# Patient Record
Sex: Female | Born: 1955
Health system: Southern US, Community
[De-identification: ages and names within clinical notes are randomized; demographics above are authoritative.]

## PROBLEM LIST (undated history)

## (undated) DIAGNOSIS — M1990 Unspecified osteoarthritis, unspecified site: Secondary | ICD-10-CM

## (undated) DIAGNOSIS — N200 Calculus of kidney: Secondary | ICD-10-CM

## (undated) DIAGNOSIS — Z9981 Dependence on supplemental oxygen: Secondary | ICD-10-CM

## (undated) DIAGNOSIS — B1920 Unspecified viral hepatitis C without hepatic coma: Secondary | ICD-10-CM

## (undated) DIAGNOSIS — J189 Pneumonia, unspecified organism: Secondary | ICD-10-CM

## (undated) DIAGNOSIS — Z720 Tobacco use: Secondary | ICD-10-CM

## (undated) DIAGNOSIS — M549 Dorsalgia, unspecified: Secondary | ICD-10-CM

## (undated) DIAGNOSIS — M5136 Other intervertebral disc degeneration, lumbar region: Secondary | ICD-10-CM

## (undated) DIAGNOSIS — Z8719 Personal history of other diseases of the digestive system: Secondary | ICD-10-CM

## (undated) DIAGNOSIS — Z8711 Personal history of peptic ulcer disease: Secondary | ICD-10-CM

## (undated) DIAGNOSIS — R52 Pain, unspecified: Secondary | ICD-10-CM

## (undated) DIAGNOSIS — G8929 Other chronic pain: Secondary | ICD-10-CM

## (undated) DIAGNOSIS — R51 Headache: Secondary | ICD-10-CM

## (undated) DIAGNOSIS — R519 Headache, unspecified: Secondary | ICD-10-CM

## (undated) DIAGNOSIS — F329 Major depressive disorder, single episode, unspecified: Secondary | ICD-10-CM

## (undated) DIAGNOSIS — F32A Depression, unspecified: Secondary | ICD-10-CM

## (undated) DIAGNOSIS — D649 Anemia, unspecified: Secondary | ICD-10-CM

## (undated) DIAGNOSIS — I10 Essential (primary) hypertension: Secondary | ICD-10-CM

## (undated) DIAGNOSIS — F419 Anxiety disorder, unspecified: Secondary | ICD-10-CM

## (undated) DIAGNOSIS — I77819 Aortic ectasia, unspecified site: Secondary | ICD-10-CM

## (undated) DIAGNOSIS — IMO0002 Reserved for concepts with insufficient information to code with codable children: Secondary | ICD-10-CM

## (undated) DIAGNOSIS — J42 Unspecified chronic bronchitis: Secondary | ICD-10-CM

## (undated) DIAGNOSIS — M4800 Spinal stenosis, site unspecified: Secondary | ICD-10-CM

## (undated) DIAGNOSIS — Z8489 Family history of other specified conditions: Secondary | ICD-10-CM

## (undated) DIAGNOSIS — J449 Chronic obstructive pulmonary disease, unspecified: Secondary | ICD-10-CM

## (undated) DIAGNOSIS — Z22322 Carrier or suspected carrier of Methicillin resistant Staphylococcus aureus: Secondary | ICD-10-CM

## (undated) HISTORY — PX: BACK SURGERY: SHX140

## (undated) HISTORY — DX: Chronic obstructive pulmonary disease, unspecified: J44.9

## (undated) HISTORY — PX: FRACTURE SURGERY: SHX138

---

## 1996-10-08 HISTORY — PX: TOTAL ABDOMINAL HYSTERECTOMY: SHX209

## 1996-10-08 HISTORY — PX: INCONTINENCE SURGERY: SHX676

## 1999-01-17 ENCOUNTER — Other Ambulatory Visit: Admission: RE | Admit: 1999-01-17 | Discharge: 1999-01-17 | Payer: Self-pay | Admitting: Obstetrics and Gynecology

## 1999-02-13 ENCOUNTER — Other Ambulatory Visit: Admission: RE | Admit: 1999-02-13 | Discharge: 1999-02-13 | Payer: Self-pay | Admitting: Obstetrics and Gynecology

## 1999-02-16 ENCOUNTER — Encounter: Payer: Self-pay | Admitting: Obstetrics and Gynecology

## 1999-02-16 ENCOUNTER — Ambulatory Visit (HOSPITAL_COMMUNITY): Admission: RE | Admit: 1999-02-16 | Discharge: 1999-02-16 | Payer: Self-pay | Admitting: Obstetrics and Gynecology

## 1999-02-17 ENCOUNTER — Inpatient Hospital Stay (HOSPITAL_COMMUNITY): Admission: RE | Admit: 1999-02-17 | Discharge: 1999-02-19 | Payer: Self-pay | Admitting: Obstetrics and Gynecology

## 1999-05-23 ENCOUNTER — Emergency Department (HOSPITAL_COMMUNITY): Admission: EM | Admit: 1999-05-23 | Discharge: 1999-05-23 | Payer: Self-pay | Admitting: Family Medicine

## 1999-05-23 ENCOUNTER — Encounter: Payer: Self-pay | Admitting: Family Medicine

## 1999-10-15 ENCOUNTER — Emergency Department (HOSPITAL_COMMUNITY): Admission: EM | Admit: 1999-10-15 | Discharge: 1999-10-15 | Payer: Self-pay | Admitting: Emergency Medicine

## 1999-12-27 ENCOUNTER — Ambulatory Visit (HOSPITAL_COMMUNITY): Admission: RE | Admit: 1999-12-27 | Discharge: 1999-12-27 | Payer: Self-pay | Admitting: *Deleted

## 2000-02-16 ENCOUNTER — Ambulatory Visit (HOSPITAL_COMMUNITY): Admission: RE | Admit: 2000-02-16 | Discharge: 2000-02-16 | Payer: Self-pay | Admitting: Neurosurgery

## 2000-03-28 ENCOUNTER — Ambulatory Visit (HOSPITAL_COMMUNITY): Admission: RE | Admit: 2000-03-28 | Discharge: 2000-03-28 | Payer: Self-pay | Admitting: Neurosurgery

## 2000-07-10 ENCOUNTER — Inpatient Hospital Stay (HOSPITAL_COMMUNITY): Admission: EM | Admit: 2000-07-10 | Discharge: 2000-07-12 | Payer: Self-pay | Admitting: *Deleted

## 2000-07-10 ENCOUNTER — Encounter: Payer: Self-pay | Admitting: *Deleted

## 2000-07-11 ENCOUNTER — Encounter: Payer: Self-pay | Admitting: Internal Medicine

## 2000-10-08 HISTORY — PX: ANTERIOR CERVICAL DECOMP/DISCECTOMY FUSION: SHX1161

## 2000-12-11 ENCOUNTER — Emergency Department (HOSPITAL_COMMUNITY): Admission: EM | Admit: 2000-12-11 | Discharge: 2000-12-11 | Payer: Self-pay | Admitting: Emergency Medicine

## 2000-12-11 ENCOUNTER — Encounter: Payer: Self-pay | Admitting: Emergency Medicine

## 2000-12-15 ENCOUNTER — Emergency Department (HOSPITAL_COMMUNITY): Admission: EM | Admit: 2000-12-15 | Discharge: 2000-12-15 | Payer: Self-pay | Admitting: Emergency Medicine

## 2000-12-29 ENCOUNTER — Emergency Department (HOSPITAL_COMMUNITY): Admission: EM | Admit: 2000-12-29 | Discharge: 2000-12-29 | Payer: Self-pay | Admitting: Emergency Medicine

## 2001-02-14 ENCOUNTER — Emergency Department (HOSPITAL_COMMUNITY): Admission: EM | Admit: 2001-02-14 | Discharge: 2001-02-14 | Payer: Self-pay | Admitting: Emergency Medicine

## 2001-03-06 ENCOUNTER — Ambulatory Visit (HOSPITAL_COMMUNITY): Admission: RE | Admit: 2001-03-06 | Discharge: 2001-03-06 | Payer: Self-pay | Admitting: Neurosurgery

## 2001-03-20 ENCOUNTER — Observation Stay (HOSPITAL_COMMUNITY): Admission: RE | Admit: 2001-03-20 | Discharge: 2001-03-21 | Payer: Self-pay | Admitting: Neurosurgery

## 2001-04-18 ENCOUNTER — Encounter: Admission: RE | Admit: 2001-04-18 | Discharge: 2001-04-18 | Payer: Self-pay | Admitting: Neurosurgery

## 2001-05-07 ENCOUNTER — Encounter: Admission: RE | Admit: 2001-05-07 | Discharge: 2001-05-07 | Payer: Self-pay | Admitting: Neurosurgery

## 2001-06-17 ENCOUNTER — Encounter: Admission: RE | Admit: 2001-06-17 | Discharge: 2001-06-17 | Payer: Self-pay | Admitting: Neurosurgery

## 2001-06-30 ENCOUNTER — Ambulatory Visit (HOSPITAL_COMMUNITY): Admission: RE | Admit: 2001-06-30 | Discharge: 2001-06-30 | Payer: Self-pay | Admitting: Neurosurgery

## 2001-12-14 ENCOUNTER — Emergency Department (HOSPITAL_COMMUNITY): Admission: EM | Admit: 2001-12-14 | Discharge: 2001-12-14 | Payer: Self-pay | Admitting: Emergency Medicine

## 2002-01-25 ENCOUNTER — Encounter: Payer: Self-pay | Admitting: Emergency Medicine

## 2002-01-25 ENCOUNTER — Emergency Department (HOSPITAL_COMMUNITY): Admission: EM | Admit: 2002-01-25 | Discharge: 2002-01-25 | Payer: Self-pay | Admitting: Emergency Medicine

## 2002-03-27 ENCOUNTER — Encounter: Payer: Self-pay | Admitting: Emergency Medicine

## 2002-03-27 ENCOUNTER — Emergency Department (HOSPITAL_COMMUNITY): Admission: EM | Admit: 2002-03-27 | Discharge: 2002-03-27 | Payer: Self-pay | Admitting: Emergency Medicine

## 2002-06-21 ENCOUNTER — Emergency Department (HOSPITAL_COMMUNITY): Admission: EM | Admit: 2002-06-21 | Discharge: 2002-06-21 | Payer: Self-pay | Admitting: Emergency Medicine

## 2002-06-22 ENCOUNTER — Encounter: Payer: Self-pay | Admitting: Emergency Medicine

## 2002-07-24 ENCOUNTER — Encounter: Payer: Self-pay | Admitting: Emergency Medicine

## 2002-07-24 ENCOUNTER — Emergency Department (HOSPITAL_COMMUNITY): Admission: EM | Admit: 2002-07-24 | Discharge: 2002-07-24 | Payer: Self-pay | Admitting: Emergency Medicine

## 2002-07-27 ENCOUNTER — Emergency Department (HOSPITAL_COMMUNITY): Admission: EM | Admit: 2002-07-27 | Discharge: 2002-07-27 | Payer: Self-pay | Admitting: Emergency Medicine

## 2003-02-04 ENCOUNTER — Emergency Department (HOSPITAL_COMMUNITY): Admission: EM | Admit: 2003-02-04 | Discharge: 2003-02-04 | Payer: Self-pay | Admitting: Emergency Medicine

## 2003-08-29 ENCOUNTER — Emergency Department (HOSPITAL_COMMUNITY): Admission: EM | Admit: 2003-08-29 | Discharge: 2003-08-29 | Payer: Self-pay | Admitting: Emergency Medicine

## 2004-01-28 ENCOUNTER — Emergency Department (HOSPITAL_COMMUNITY): Admission: EM | Admit: 2004-01-28 | Discharge: 2004-01-28 | Payer: Self-pay | Admitting: Emergency Medicine

## 2004-02-01 ENCOUNTER — Emergency Department (HOSPITAL_COMMUNITY): Admission: EM | Admit: 2004-02-01 | Discharge: 2004-02-01 | Payer: Self-pay | Admitting: Emergency Medicine

## 2004-02-13 ENCOUNTER — Emergency Department (HOSPITAL_COMMUNITY): Admission: EM | Admit: 2004-02-13 | Discharge: 2004-02-13 | Payer: Self-pay | Admitting: Emergency Medicine

## 2004-07-31 ENCOUNTER — Emergency Department (HOSPITAL_COMMUNITY): Admission: EM | Admit: 2004-07-31 | Discharge: 2004-07-31 | Payer: Self-pay | Admitting: Emergency Medicine

## 2004-08-17 ENCOUNTER — Emergency Department (HOSPITAL_COMMUNITY): Admission: EM | Admit: 2004-08-17 | Discharge: 2004-08-17 | Payer: Self-pay | Admitting: Emergency Medicine

## 2005-03-24 ENCOUNTER — Emergency Department (HOSPITAL_COMMUNITY): Admission: EM | Admit: 2005-03-24 | Discharge: 2005-03-24 | Payer: Self-pay | Admitting: Emergency Medicine

## 2005-04-16 ENCOUNTER — Emergency Department (HOSPITAL_COMMUNITY): Admission: EM | Admit: 2005-04-16 | Discharge: 2005-04-16 | Payer: Self-pay | Admitting: Emergency Medicine

## 2005-05-09 ENCOUNTER — Emergency Department (HOSPITAL_COMMUNITY): Admission: EM | Admit: 2005-05-09 | Discharge: 2005-05-09 | Payer: Self-pay | Admitting: Emergency Medicine

## 2005-05-14 ENCOUNTER — Ambulatory Visit (HOSPITAL_COMMUNITY): Admission: RE | Admit: 2005-05-14 | Discharge: 2005-05-14 | Payer: Self-pay | Admitting: Orthopedic Surgery

## 2005-06-25 ENCOUNTER — Encounter
Admission: RE | Admit: 2005-06-25 | Discharge: 2005-09-23 | Payer: Self-pay | Admitting: Physical Medicine & Rehabilitation

## 2005-06-25 ENCOUNTER — Ambulatory Visit: Payer: Self-pay | Admitting: Physical Medicine & Rehabilitation

## 2005-07-14 ENCOUNTER — Emergency Department (HOSPITAL_COMMUNITY): Admission: EM | Admit: 2005-07-14 | Discharge: 2005-07-14 | Payer: Self-pay | Admitting: Emergency Medicine

## 2005-09-07 ENCOUNTER — Emergency Department (HOSPITAL_COMMUNITY): Admission: EM | Admit: 2005-09-07 | Discharge: 2005-09-07 | Payer: Self-pay | Admitting: Emergency Medicine

## 2005-09-22 ENCOUNTER — Encounter
Admission: RE | Admit: 2005-09-22 | Discharge: 2005-12-21 | Payer: Self-pay | Admitting: Physical Medicine & Rehabilitation

## 2005-09-22 ENCOUNTER — Ambulatory Visit: Payer: Self-pay | Admitting: Physical Medicine & Rehabilitation

## 2005-10-24 ENCOUNTER — Ambulatory Visit: Payer: Self-pay | Admitting: Physical Medicine & Rehabilitation

## 2005-10-25 ENCOUNTER — Ambulatory Visit: Payer: Self-pay | Admitting: Physical Medicine & Rehabilitation

## 2005-11-14 ENCOUNTER — Emergency Department (HOSPITAL_COMMUNITY): Admission: EM | Admit: 2005-11-14 | Discharge: 2005-11-14 | Payer: Self-pay | Admitting: Emergency Medicine

## 2005-11-23 ENCOUNTER — Ambulatory Visit (HOSPITAL_COMMUNITY)
Admission: RE | Admit: 2005-11-23 | Discharge: 2005-11-23 | Payer: Self-pay | Admitting: Physical Medicine & Rehabilitation

## 2005-11-27 ENCOUNTER — Emergency Department (HOSPITAL_COMMUNITY): Admission: EM | Admit: 2005-11-27 | Discharge: 2005-11-27 | Payer: Self-pay | Admitting: Emergency Medicine

## 2005-12-20 ENCOUNTER — Encounter
Admission: RE | Admit: 2005-12-20 | Discharge: 2006-03-20 | Payer: Self-pay | Admitting: Physical Medicine & Rehabilitation

## 2005-12-20 ENCOUNTER — Ambulatory Visit: Payer: Self-pay | Admitting: Physical Medicine & Rehabilitation

## 2006-01-01 ENCOUNTER — Emergency Department (HOSPITAL_COMMUNITY): Admission: EM | Admit: 2006-01-01 | Discharge: 2006-01-01 | Payer: Self-pay | Admitting: Emergency Medicine

## 2006-01-07 ENCOUNTER — Encounter: Admission: RE | Admit: 2006-01-07 | Discharge: 2006-04-07 | Payer: Self-pay | Admitting: Anesthesiology

## 2006-01-08 ENCOUNTER — Ambulatory Visit: Payer: Self-pay | Admitting: Anesthesiology

## 2006-04-01 ENCOUNTER — Emergency Department (HOSPITAL_COMMUNITY): Admission: EM | Admit: 2006-04-01 | Discharge: 2006-04-01 | Payer: Self-pay | Admitting: *Deleted

## 2006-07-20 ENCOUNTER — Emergency Department (HOSPITAL_COMMUNITY): Admission: EM | Admit: 2006-07-20 | Discharge: 2006-07-20 | Payer: Self-pay | Admitting: Emergency Medicine

## 2006-10-17 ENCOUNTER — Emergency Department (HOSPITAL_COMMUNITY): Admission: EM | Admit: 2006-10-17 | Discharge: 2006-10-17 | Payer: Self-pay | Admitting: Family Medicine

## 2006-10-17 ENCOUNTER — Emergency Department (HOSPITAL_COMMUNITY): Admission: EM | Admit: 2006-10-17 | Discharge: 2006-10-17 | Payer: Self-pay | Admitting: Emergency Medicine

## 2007-04-05 ENCOUNTER — Emergency Department (HOSPITAL_COMMUNITY): Admission: EM | Admit: 2007-04-05 | Discharge: 2007-04-05 | Payer: Self-pay | Admitting: Family Medicine

## 2007-04-25 ENCOUNTER — Emergency Department (HOSPITAL_COMMUNITY): Admission: EM | Admit: 2007-04-25 | Discharge: 2007-04-25 | Payer: Self-pay | Admitting: Emergency Medicine

## 2007-07-22 ENCOUNTER — Encounter: Payer: Self-pay | Admitting: Emergency Medicine

## 2007-07-23 ENCOUNTER — Inpatient Hospital Stay (HOSPITAL_COMMUNITY): Admission: AD | Admit: 2007-07-23 | Discharge: 2007-07-29 | Payer: Self-pay | Admitting: Psychiatry

## 2007-07-23 ENCOUNTER — Ambulatory Visit: Payer: Self-pay | Admitting: Psychiatry

## 2007-08-20 ENCOUNTER — Inpatient Hospital Stay (HOSPITAL_COMMUNITY): Admission: EM | Admit: 2007-08-20 | Discharge: 2007-08-29 | Payer: Self-pay | Admitting: Emergency Medicine

## 2007-08-20 ENCOUNTER — Ambulatory Visit: Payer: Self-pay | Admitting: Internal Medicine

## 2007-09-05 ENCOUNTER — Encounter (INDEPENDENT_AMBULATORY_CARE_PROVIDER_SITE_OTHER): Payer: Self-pay | Admitting: Internal Medicine

## 2007-09-10 ENCOUNTER — Encounter (INDEPENDENT_AMBULATORY_CARE_PROVIDER_SITE_OTHER): Payer: Self-pay | Admitting: Internal Medicine

## 2007-09-11 ENCOUNTER — Ambulatory Visit: Payer: Self-pay | Admitting: Internal Medicine

## 2007-09-11 DIAGNOSIS — F1910 Other psychoactive substance abuse, uncomplicated: Secondary | ICD-10-CM

## 2007-09-11 DIAGNOSIS — F17200 Nicotine dependence, unspecified, uncomplicated: Secondary | ICD-10-CM | POA: Insufficient documentation

## 2007-09-11 DIAGNOSIS — J13 Pneumonia due to Streptococcus pneumoniae: Secondary | ICD-10-CM | POA: Insufficient documentation

## 2007-09-11 DIAGNOSIS — A403 Sepsis due to Streptococcus pneumoniae: Secondary | ICD-10-CM | POA: Insufficient documentation

## 2007-09-11 DIAGNOSIS — J449 Chronic obstructive pulmonary disease, unspecified: Secondary | ICD-10-CM

## 2007-09-11 DIAGNOSIS — J4489 Other specified chronic obstructive pulmonary disease: Secondary | ICD-10-CM | POA: Insufficient documentation

## 2007-09-14 DIAGNOSIS — R030 Elevated blood-pressure reading, without diagnosis of hypertension: Secondary | ICD-10-CM

## 2007-10-31 ENCOUNTER — Encounter (INDEPENDENT_AMBULATORY_CARE_PROVIDER_SITE_OTHER): Payer: Self-pay | Admitting: Internal Medicine

## 2007-12-17 ENCOUNTER — Ambulatory Visit: Payer: Self-pay | Admitting: Internal Medicine

## 2007-12-17 ENCOUNTER — Encounter (INDEPENDENT_AMBULATORY_CARE_PROVIDER_SITE_OTHER): Payer: Self-pay | Admitting: Internal Medicine

## 2007-12-17 DIAGNOSIS — M542 Cervicalgia: Secondary | ICD-10-CM

## 2007-12-18 ENCOUNTER — Encounter (INDEPENDENT_AMBULATORY_CARE_PROVIDER_SITE_OTHER): Payer: Self-pay | Admitting: Internal Medicine

## 2007-12-18 DIAGNOSIS — B1920 Unspecified viral hepatitis C without hepatic coma: Secondary | ICD-10-CM

## 2007-12-18 LAB — CONVERTED CEMR LAB
Alkaline Phosphatase: 115 units/L (ref 39–117)
BUN: 5 mg/dL — ABNORMAL LOW (ref 6–23)
HCT: 39 % (ref 36.0–46.0)
HCV Ab: POSITIVE — AB
Hemoglobin: 12.4 g/dL (ref 12.0–15.0)
MCHC: 31.8 g/dL (ref 30.0–36.0)
MCV: 93.3 fL (ref 78.0–100.0)
Platelets: 256 10*3/uL (ref 150–400)
Potassium: 4.7 meq/L (ref 3.5–5.3)
RBC: 4.18 M/uL (ref 3.87–5.11)
Sodium: 143 meq/L (ref 135–145)
Total Bilirubin: 0.3 mg/dL (ref 0.3–1.2)

## 2007-12-19 ENCOUNTER — Telehealth: Payer: Self-pay | Admitting: *Deleted

## 2007-12-23 ENCOUNTER — Ambulatory Visit: Payer: Self-pay | Admitting: Internal Medicine

## 2007-12-23 ENCOUNTER — Encounter (INDEPENDENT_AMBULATORY_CARE_PROVIDER_SITE_OTHER): Payer: Self-pay | Admitting: Internal Medicine

## 2008-05-29 ENCOUNTER — Emergency Department (HOSPITAL_COMMUNITY): Admission: EM | Admit: 2008-05-29 | Discharge: 2008-05-29 | Payer: Self-pay | Admitting: Emergency Medicine

## 2008-08-24 ENCOUNTER — Emergency Department (HOSPITAL_COMMUNITY): Admission: EM | Admit: 2008-08-24 | Discharge: 2008-08-24 | Payer: Self-pay | Admitting: Emergency Medicine

## 2008-08-30 ENCOUNTER — Encounter: Payer: Self-pay | Admitting: Internal Medicine

## 2008-11-09 ENCOUNTER — Encounter: Payer: Self-pay | Admitting: Internal Medicine

## 2009-09-13 ENCOUNTER — Emergency Department (HOSPITAL_COMMUNITY): Admission: EM | Admit: 2009-09-13 | Discharge: 2009-09-13 | Payer: Self-pay | Admitting: Emergency Medicine

## 2009-11-30 ENCOUNTER — Emergency Department (HOSPITAL_COMMUNITY): Admission: EM | Admit: 2009-11-30 | Discharge: 2009-11-30 | Payer: Self-pay | Admitting: Emergency Medicine

## 2010-06-16 ENCOUNTER — Emergency Department (HOSPITAL_COMMUNITY): Admission: EM | Admit: 2010-06-16 | Discharge: 2010-06-17 | Payer: Self-pay | Admitting: Emergency Medicine

## 2010-06-21 ENCOUNTER — Emergency Department (HOSPITAL_COMMUNITY): Admission: EM | Admit: 2010-06-21 | Discharge: 2010-06-21 | Payer: Self-pay | Admitting: Emergency Medicine

## 2010-08-26 ENCOUNTER — Emergency Department (HOSPITAL_COMMUNITY): Admission: EM | Admit: 2010-08-26 | Discharge: 2010-08-26 | Payer: Self-pay | Admitting: Emergency Medicine

## 2010-10-29 ENCOUNTER — Encounter: Payer: Self-pay | Admitting: Internal Medicine

## 2010-10-29 ENCOUNTER — Encounter: Payer: Self-pay | Admitting: Hospitalist

## 2010-12-21 LAB — DIFFERENTIAL
Basophils Absolute: 0.1 10*3/uL (ref 0.0–0.1)
Basophils Relative: 1 % (ref 0–1)
Eosinophils Absolute: 0.2 10*3/uL (ref 0.0–0.7)
Eosinophils Relative: 1 % (ref 0–5)
Lymphocytes Relative: 27 % (ref 12–46)
Lymphs Abs: 2.4 10*3/uL (ref 0.7–4.0)
Monocytes Relative: 7 % (ref 3–12)
Neutro Abs: 5.7 10*3/uL (ref 1.7–7.7)
Neutrophils Relative %: 65 % (ref 43–77)

## 2010-12-21 LAB — CBC
MCHC: 33.9 g/dL (ref 30.0–36.0)
MCHC: 34.1 g/dL (ref 30.0–36.0)
RBC: 4.88 MIL/uL (ref 3.87–5.11)
RBC: 4.74 MIL/uL (ref 3.87–5.11)
RDW: 13.5 % (ref 11.5–15.5)
RDW: 13.8 % (ref 11.5–15.5)
WBC: 8.7 10*3/uL (ref 4.0–10.5)

## 2010-12-21 LAB — URINALYSIS, ROUTINE W REFLEX MICROSCOPIC
Bilirubin Urine: NEGATIVE
Bilirubin Urine: NEGATIVE
Glucose, UA: NEGATIVE mg/dL
Hgb urine dipstick: NEGATIVE
Hgb urine dipstick: NEGATIVE
Ketones, ur: NEGATIVE mg/dL
Ketones, ur: NEGATIVE mg/dL
Nitrite: NEGATIVE
Protein, ur: NEGATIVE mg/dL
Urobilinogen, UA: 0.2 mg/dL (ref 0.0–1.0)
pH: 5.5 (ref 5.0–8.0)
pH: 6 (ref 5.0–8.0)

## 2010-12-21 LAB — RAPID URINE DRUG SCREEN, HOSP PERFORMED
Amphetamines: NOT DETECTED
Amphetamines: NOT DETECTED
Barbiturates: NOT DETECTED
Barbiturates: NOT DETECTED
Benzodiazepines: NOT DETECTED
Benzodiazepines: NOT DETECTED
Cocaine: POSITIVE — AB
Opiates: NOT DETECTED

## 2010-12-21 LAB — BASIC METABOLIC PANEL
BUN: 2 mg/dL — ABNORMAL LOW (ref 6–23)
CO2: 28 mEq/L (ref 19–32)
CO2: 28 mEq/L (ref 19–32)
Calcium: 9.4 mg/dL (ref 8.4–10.5)
Calcium: 9.4 mg/dL (ref 8.4–10.5)
Chloride: 101 mEq/L (ref 96–112)
Chloride: 105 mEq/L (ref 96–112)
GFR calc non Af Amer: 54 mL/min — ABNORMAL LOW (ref >60)
GFR calc non Af Amer: >60 mL/min (ref >60)
Glucose, Bld: 94 mg/dL (ref 70–99)
Potassium: 4.2 mEq/L (ref 3.5–5.1)
Potassium: 3.3 mEq/L — ABNORMAL LOW (ref 3.5–5.1)
Sodium: 137 mEq/L (ref 135–145)
Sodium: 139 mEq/L (ref 135–145)

## 2010-12-21 LAB — ETHANOL
Alcohol, Ethyl (B): <5 mg/dL (ref 0–10)
Alcohol, Ethyl (B): <5 mg/dL (ref 0–10)

## 2011-01-09 LAB — DIFFERENTIAL
Eosinophils Absolute: 0.3 10*3/uL (ref 0.0–0.7)
Eosinophils Relative: 4 % (ref 0–5)
Lymphs Abs: 2.7 10*3/uL (ref 0.7–4.0)
Monocytes Relative: 8 % (ref 3–12)

## 2011-01-09 LAB — CBC
HCT: 40.6 % (ref 36.0–46.0)
MCHC: 33.9 g/dL (ref 30.0–36.0)
MCV: 93.8 fL (ref 78.0–100.0)
Platelets: 198 10*3/uL (ref 150–400)
WBC: 9.4 10*3/uL (ref 4.0–10.5)

## 2011-01-09 LAB — BASIC METABOLIC PANEL
BUN: 1 mg/dL — ABNORMAL LOW (ref 6–23)
CO2: 24 mEq/L (ref 19–32)
Chloride: 109 mEq/L (ref 96–112)
Potassium: 3.2 mEq/L — ABNORMAL LOW (ref 3.5–5.1)

## 2011-01-09 LAB — POCT CARDIAC MARKERS
CKMB, poc: <1 ng/mL — ABNORMAL LOW (ref 1.0–8.0)
Troponin i, poc: <0.05 ng/mL (ref 0.00–0.09)
Troponin i, poc: <0.05 ng/mL (ref 0.00–0.09)

## 2011-02-20 NOTE — Discharge Summary (Signed)
NAMEJIANNI, SHELDEN NO.:  [id number]   MEDICAL RECORD NO.:  [id number]          PATIENT TYPE:  INP   LOCATION:  2027                         FACILITY:  [facility]   PHYSICIAN:  Ileana Roup, M.D.  DATE OF BIRTH:  06/20/1956   DATE OF ADMISSION:  08/20/2007  DATE OF DISCHARGE:  08/29/2007                               DISCHARGE SUMMARY   DISCHARGE DIAGNOSES:  1. Pneumococcal pneumonia and sepsis.  2. Respiratory failure.  3. Chronic obstructive pulmonary disease.  4. Polysubstance abuse.  5. Tobacco abuse  6. Anemia.   DISCHARGE MEDICATIONS:  1. Seroquel 50 mg p.o. q.h.s.  2. Methadone 40 mg p.o. daily through the methadone maintenance      clinic.  3. Cymbalta 60 mg p.o. daily.  4. Advair 250/50 mcg one puff b.i.d.  5. Avelox 400 mg p.o. daily x3 days.  6. Albuterol MDI two puffs p.r.n. up to four times a day.  7. Home O2 2 L/min. continuous.  The patient is not to smoke while on      oxygen.   DISPOSITION AND FOLLOW-UP:  The patient is to follow up with Dr.  Ardyth Harps in the outpatient clinic on September 09, 2007, at 3:30, at  which time her breathing status should be evaluated.  It should also be  checked to see if the patient could possibly come off of home O2 once  her pneumonia has resolved.  The patient should also be inquired about  whether or not she is smoking cigarettes or not.   PROCEDURES PERFORMED:  1. The patient had chest x-ray on August 20, 2007, which showed      dense right-sided airspace disease in a patient with severe      emphysema.  2. Chest x-ray on November 13 showed marked worsening aeration of the      left chest but no change in the right.  3. Chest x-ray on August 22, 2007.  Impression:  Emphysema with      diffuse bilateral air space disease with some worsening on the left      consistent with pneumonia.  4. Chest x-ray August 23, 2007.  Impression:  (1) extensive      bilateral airspace disease consistent with  pneumonia.  (2)      Emphysema.  5. Chest x-ray August 26, 2007.  Impression:  (1) bilateral      pulmonary airspace disease superimposed on severe bullous      emphysema.  This is mildly improved in the left lung since prior      study.  (2) Increased small right pleural effusion.  Right lung      atelectasis or scarring without change.  6. Chest x-ray August 24, 2007.  Impression:  Bilateral pneumonia.      Some improved aeration of the right lower lobe.  7. Chest x-ray August 28, 2007.  (1) Improved right middle lobe      airspace disease and right pleural effusion.  (2) Severe emphysema      and right lung bullous disease.  (3) Question improving  interstitial edema.   There were no consultations during hospitalization.   Brief H&P is as follows:   CHIEF COMPLAINT:  Shortness of breath.   HISTORY OF PRESENT ILLNESS:  The patient is a 55 year old female with a  history of COPD and chronic bronchitis, who presents with a 1-week  history of shortness of breath.  She called EMS on the day of admission  when she became extremely short of breath and it was noted that her O2  saturations were 76%.  The patient also said she has had a cough  productive of greenish-white sputum, fevers to 100 degrees at home, and  chills.  She has pleuritic chest pain when she takes a deep breath and  feels generally weak and has not had a good appetite.  She has never  been admitted to the hospital for any pulmonary problems before and has  never been intubated.   ALLERGIES:  No known drug allergies.   PAST MEDICAL HISTORY:  1. COPD with chronic bronchitis.  2. DJD secondary to ruptured cervical disk at C2 and C3, status post      cervical fusion.  3. History of polysubstance abuse with methadone maintenance.   HOME MEDICATIONS:  1. Methadone 40 mg daily.  The patient follows up with [facility] and      contacted Darl Pikes, [phone].  2. Cymbalta 60 mg q.h.s.  3. Seroquel 50 mg q.h.s.   For  rest of history, please see hospital chart.   VITAL SIGNS:  Temperature equals 98 initially and then increased to  101.5, blood pressure 109/76, pulse equals 116, respiratory rate equals  23, O2 saturation equals 92% on 15 L.  GENERAL:  The patient appears ill but is awake and alert, in no acute  distress.  EYES:  EOMI, PERRLA, anicteric, slight ptosis on the left eyelid.  ENT: Oropharynx is erythematous without exudate, dry mucous membranes.  NECK:  Supple.  No lymphadenopathy.  No TM.  RESPIRATORY:  Clear apices, decreased breath sounds from bases to mid-  lungs, right greater than left.  Bilateral bibasilar rhonchi that  improve with cough.  The patient is able to talk in full sentences.  CV:  Tachy, regular, no murmurs, rubs or gallops.  GI: Soft, positive bowel sounds, mildly diffusely tender to palpation.  EXTREMITIES:  No clubbing, cyanosis or edema.  SKIN:  Moist, warm and pale.  No lymphadenopathy.  MUSCULOSKELETAL:  Moving all fours.  NEUROLOGIC:  Generalized weakness, grossly nonfocal.  PSYCHIATRIC:  Appropriate.   ADMISSION LABS:  Sodium 132, potassium 3.5, chloride 99, bicarb 23, BUN  26, creatinine 1.6, glucose 121.  WBC 25.2, hemoglobin 13.3 platelets  234, ANC 23.1, MCV equals 88.  Chest x-ray with a right middle lobe  pneumonia in a patient with severe bullous emphysema, no pneumothorax  noted.   HOSPITAL COURSE BY PROBLEM:  Problem 1.  PNEUMOCOCCAL PNEUMONIA AND SEPSIS:  The patient was admitted  to the step-down unit and started on azithromycin and Rocephin for  community-acquired pneumonia.  The patient had a sputum culture and  blood cultures which grew out Streptococcus pneumoniae.  The patient was  initially put on oxygen via nasal cannula but then had to be put on  BiPAP for decreased O2 saturations.  Blood cultures x2 were drawn with  and were positive for Streptococcus pneumoniae.  Of note, the patient  became hypotensive, likely secondary to her strep  bacteremia.  The  patient was given IV fluid rehydration, being careful not to overload  her.  Also of note, the patient's creatinine was initially elevated at  1.6 with the FENA less than 1, indicating that she was volume depleted.  The patient's creatinine improved to normal with hydration.  The patient  slowly improved throughout the hospital stay, but continued to require  supplemental oxygen to maintain adequate O2 saturations.  It is likely  that she has severe underlying emphysema, and pulmonary function testing  as an outpatient would be helpful.  The patient was sent home on home  oxygen, and will need to be evaluated in the outpatient setting as to  whether or not she needs to be continued on this once her pneumonia  resolves.  The patient was put on a nicotine patch while in hospital and  was counseled repeatedly on the need for smoking cessation.  The patient  appeared to be eager to not smoke anymore.  Of note, influenza was also  checked, which was negative.  Legionella was also negative.   Problem 2.  POLYSUBSTANCE ABUSE:  The patient's UDS was positive for  cocaine as well as methadone on her admission.  She reports that the  last time that she did the cocaine was just prior to her bout with  pneumonia.  She received counseling on several occasions that she needed  to avoid smoking cocaine as with her severe lung disease this was very  dangerous.  The patient was continued on her methadone after her dose  was confirmed by speaking with Darl Pikes, who manages this for her.  Patient  will follow up at the methadone maintenance clinic for outpatient  management.   Problem 3.  CHRONIC OBSTRUCTIVE PULMONARY DISEASE:  The patient will be  discharged on albuterol MDI as well as Advair.  She will also have  oxygen via nasal cannula to be administered continuously until this  could be reassessed in the outpatient setting.   Problem 4.  ANEMIA:  Anemia panel revealed this as  consistent with  anemia of chronic disease.  She was FOBT-negative during the hospital  stay.  The patient's hemoglobin remained stable.   DISCHARGE LABS:  Sodium 136, potassium 4.6, chloride 98, bicarb 27,  glucose 111, BUN 6, creatinine 0.83, calcium 9.0.  WBC 14.2 and elevated  likely secondary to steroid usage, hemoglobin 12.2, hematocrit 36.2,  platelets 529.   DISCHARGE VITALS:  Temperature 97.4, pulse 88, respiratory rate 22,  blood pressure 100/70, O2 saturation is 92% on 4 L.      Rufina Falco, M.D.  Electronically Signed      Ileana Roup, M.D.  Electronically Signed    JY/MEDQ  D:  09/05/2007  T:  09/05/2007  Job:  [id number]   cc:   Peggye Pitt, M.D.

## 2011-02-20 NOTE — H&P (Signed)
Emily Moran, Emily Moran NO.:  [id number]   MEDICAL RECORD NO.:  [id number]          PATIENT TYPE:  IPS   LOCATION:  0502                          FACILITY:  [facility]   PHYSICIAN:  Anselm Jungling, MD  DATE OF BIRTH:  June 21, 1956   DATE OF ADMISSION:  07/23/2007  DATE OF DISCHARGE:                       PSYCHIATRIC ADMISSION ASSESSMENT   IDENTIFICATION:  This is a 55 year old female voluntarily admitted on  July 23, 2007.   HISTORY OF PRESENT ILLNESS:  The patient presents with a history of  opiate dependence.  Has been doing a bag of heroin daily.  Was [facility]  [facility] but states the DA was investigating  the [facility], that interrupted her obtaining medications and she had  to resort back to using heroin.  She denies any suicidal or homicidal  thoughts.  She states she has been sleeping well.   PAST PSYCHIATRIC HISTORY:  First admission to the [facility].  Sees Ellis Savage for outpatient mental health treatment.   SOCIAL HISTORY:  This is a 55 year old female, lives alone, has a  history of a legal charge in 1998 by obtaining property by false  pretenses.  She has a son, age 107.  She is currently unemployed.   FAMILY HISTORY:  None.   ALCOHOL/DRUG HISTORY:  As above.  The patient also using marijuana and  cocaine.   PRIMARY CARE PHYSICIAN:  Primary care [name] is Dr. Ardelle Park.   MEDICAL PROBLEMS:  Degenerative disk disease and herniated disk.   MEDICATIONS:  Had a prescription for Lyrica 100 mg daily.   ALLERGIES:  No known allergies.   PHYSICAL EXAMINATION:  This is a middle-aged female, appears thin,  appears older than her calendar years.  She was assessed at [facility]gency [facility].  She received Ativan, clonidine 0.2 mg, IV fluids.  Her temperature is 98.4, heart rate 112, respiration 24, blood pressure  141/96, 94% saturated.   LABORATORY DATA:  Her urinalysis is negative.  Hemoglobin 15.3,  INR is  1.  Urine drug screen is positive for opiates, positive for cocaine,  positive for THC.  Chest x-ray showed no acute changes.  Alcohol level  less than 5.   MENTAL STATUS EXAM:  She is alert, cooperative, casually dressed, fair  eye contact.  Her speech is clear, normal pace and tone.  The patient's  mood is hurting, feeling anxious.  Her affect is flat.  Thought process  no evidence of any thought disorder.  Cognitive function intact.  Memory  is fair.  Judgment and insight is fair.   DIAGNOSES:  AXIS I:  Opiate dependence.  Polysubstance abuse.  AXIS II:  Deferred.  AXIS III:  Hypertension, degenerative disk disease, chronic obstructive  pulmonary disease, arthritis.  AXIS IV:  Medical problems, other psychosocial problems.  AXIS V:  Current 45.   PLAN:  Contract for safety.  Stabilize her mood and thinking.  Will  detox the patient with the clonidine protocol.  Work on relapse  prevention.  Casemanager is to assess her follow-up rehab.  Will also  assess comorbidities  as the patient continues to detox.   TENTATIVE LENGTH OF STAY:  Four to six days.      Landry Corporal, N.P.      Anselm Jungling, MD  Electronically Signed    JO/MEDQ  D:  07/28/2007  T:  07/29/2007  Job:  [id number]

## 2011-02-23 NOTE — Procedures (Signed)
NAMEKIRSTINE, Moran NO.:  [id number]   MEDICAL RECORD NO.:  [id number]          PATIENT TYPE:  REC   LOCATION:  TPC                          FACILITY:  [facility]   PHYSICIAN:  Celene Kras, MD        DATE OF BIRTH:  08-16-1956   DATE OF PROCEDURE:  01/29/2006  DATE OF DISCHARGE:                                 OPERATIVE REPORT   PATIENT:  Emily Moran.   DATE OF BIRTH:  October 01, 1956   SURGEON:  Jewel Baize. Stevphen Rochester, M.D.   [facility]ily [facility] and I evaluated  her.  I have reviewed the Health and history form. I reviewed the 14 point  review of systems.   1.  She has benefited from left cervical facet medial branch injection and      second injection is warranted.  2.  She comes in today stating she absolutely must have something for pain      and something stronger than Norco.  Today she may need to go to the ER.      I cautioned as to using the ER for nonemergent issues, and for pain      control outside of patient care agreement.  3.  Defer to pain control strategy to Dr. Rosine Abe.  I relate to her      rationale for performing this procedure is to get away from these      medications and decrease her reliance on them, to emphasize non-narcotic      medication alternatives.  Other modifiable features and help __________      review.  4.  Consider RF at some point.  We want her to go back on Cymbalta which she      has been out of, and there seems to be a component of anxiety here, that      seems to be escalating her overall pain presentation.  Quite a bit of      pain behavior but she does have mechanical pain, and injection is      warranted to attenuate symptoms.  5.  UDS full informed consent.   OBJECTIVE:  Diffuse paracervical myofascial discomfort.  Positive cervical  facet compression test, left greater than right.  Paralumbar position,  mostly myofascial.  No new neurological findings.   IMPRESSION:  Cervical facet  syndrome,  degenerative spine disease cervical  spine.   PLAN:  1.  Cervical facet medial branch injections C4, 5, 6, 7.  Contributory      enervation addressed left side under local anesthetic and she has      consented.   The patient was taken to the fluoroscopy suite, placed in supine position  after prep and drape usual fashion.  Using 25 gauge needle, I advanced to  cervical facet, medial branch, C4, 5, 6, and 7 contributory enervation and  addressed at three independent needle access points under local anesthetic.  Once confirmation is made, placement is confirmed multiple fluoroscopic  positioning. We injected 0.5 mL of lidocaine 1% NPF at each level, total  40  mg Aristocort in divided dose.   The patient tolerated the procedure well. No complications to procedure.  Appropriate recovery.  Discharge instructions given.  See her in follow-up.           ______________________________  Celene Kras, MD     HH/MEDQ  D:  01/29/2006 11:27:11  T:  01/30/2006 08:17:07  Job:  [id number]

## 2011-02-23 NOTE — Procedures (Signed)
Emily Moran, Emily Moran NO.:  [id number]   MEDICAL RECORD NO.:  [id number]          PATIENT TYPE:  REC   LOCATION:  TPC                          FACILITY:  [facility]   PHYSICIAN:  Erick Colace, M.D.DATE OF BIRTH:  1955/11/24   DATE OF PROCEDURE:  11/22/2005  DATE OF DISCHARGE:                                 OPERATIVE REPORT   PROCEDURE:  Left S1 transforaminal epidural steroid injection under  fluoroscopic guidance.   INDICATION FOR PROCEDURE:  Left lower extremity radicular pain partially  relieved with Vicodin.  She had two weeks of relief after October 25, 2005,  S1 transforaminal on the left.  No appreciable improvement after lumbar  medial branch blocks.   Informed consent was obtained after describing the risks and benefits of the  procedure to the patient.  These include bleeding, bruising, infection, loss  of bowel and bladder function, temporary or permanent paralysis.  She elects  to proceed and has given written consent.   Patient placed prone on fluoroscopy table, Betadine prep, sterile drape.  A  25-gauge 1-1/2 inch needle was used to anesthetize the skin and subcu  tissues with 1 mL of 1% lidocaine.  Then a 25-gauge 3-inch spinal needle was  inserted in the left S1 foramen under fluoroscopic guidance.  AP and lateral  imaging utilized.  Omnipaque 180 x0.5 mL demonstrated no intravascular  uptake.  Then a solution containing 1 mL of 40 mg/mL Depo-Medrol and 2 mL of  1% methylparaben-free lidocaine were injected.  The patient tolerated the  procedure well.   Postinjection instructions given.  The patient will return in three to four  weeks for possible reinjection.   ADDENDUM:  Patient complaining of increasing neck pain over the last several  weeks with some left arm discomfort as well.  She has a history of C4-5, C5-  6 anterior cervical diskectomy, interbody iliac crest allograft arthrodesis  with anterior cervical plating in 2002.  Will  order a CT contrast and have  her see Dr. Celene Kras for possible cervical facet injections below level  of fusion.      Erick Colace, M.D.  Electronically Signed     AEK/MEDQ  D:  11/22/2005 15:26:22  T:  11/23/2005 06:18:19  Job:  [id number]   cc:   Celene Kras, MD  Fax: [phone]   Cristi Loron, M.D.  Fax: [phone]

## 2011-02-23 NOTE — Procedures (Signed)
NAMEEVERLI, ROTHER NO.:  [id number]   MEDICAL RECORD NO.:  [id number]          PATIENT TYPE:  REC   LOCATION:  TPC                          FACILITY:  [facility]   PHYSICIAN:  Erick Colace, M.D.DATE OF BIRTH:  07-25-1956   DATE OF PROCEDURE:  10/25/2005  DATE OF DISCHARGE:                                 OPERATIVE REPORT   PROCEDURE:  Left S1 transforaminal epidural steroid injection under  fluoroscopic guidance.   INDICATIONS FOR PROCEDURE:  Left lower extremity radicular pain only  partially relieved by Vicodin.   Informed consent was obtained after describing risks and benefits of the  procedure to the patient.  These include bleeding, bruising, infection, loss  of bowel and bladder function, temporary or permanent paralysis.  She elects  to proceed and has given written consent.  Patient placed prone on  fluoroscopy table.  Betadine prep and sterile drape.  A 25-gauge 1-1/2 inch  needle was used to anesthetize skin and subcutaneous tissues with 2 mL of 1%  lidocaine.  Then a 22-gauge 3-1/2 inch spinal needle was inserted into the  left S1 foramen under fluoroscopic guidance, confirmed on both AP and  lateral imaging as well as Omnipaque 180 under live fluoroscopy  demonstrating good epidural flow.  No intravascular uptake with 0.67mL of  Omnipaque 180.  Then a solution containing 1 mL of 40 mg/mL Depo-Medrol  placed 2 mL of 1% methylparaben-free lidocaine were injected.  Patient  tolerated the procedure well.  Post injection instructions given.  She will  return in one month for possible reinjection of S1 transforaminal.  She did  not have good result with lumbar facet medial branch blocks.      Erick Colace, M.D.  Electronically Signed     AEK/MEDQ  D:  10/25/2005 16:11:51  T:  10/26/2005 08:43:17  Job:  [id number]

## 2011-02-23 NOTE — Procedures (Signed)
[name], Emily Moran NO.:  [id number]   MEDICAL RECORD NO.:  [id number]          PATIENT TYPE:  REC   LOCATION:  TPC                          FACILITY:  [facility]   PHYSICIAN:  Erick Colace, M.D.DATE OF BIRTH:  [birth year]   DATE OF PROCEDURE:  12/24/2005  DATE OF DISCHARGE:                                 OPERATIVE REPORT   PROCEDURE:  Left transforaminal epidural steroid injection under  fluoroscopic guidance.   REASON FOR INJECTION:  Low back and left lower extremity pain with previous  good relief from the left transforaminal epidural steroid injection under  fluoroscopic guidance done on November 22, 2005, as well as October 25, 2005.   She has had some partial relief with combination of Norco 5/325 one p.o.  t.i.d. with Limbal 500 b.i.d.   Informed consent was obtained after describing risks and benefits of the  procedure to the patient.  These include bleeding, bruising, infection, loss  of bowel and bladder function, temporary or permanent paralysis.  She elects  to proceed and has given written consent.   Patient placed prone on fluoroscopy table.  Betadine prep and sterile  drapes.  A 25-gauge 1-1/2 inch needle was used to anesthetize skin and  subcutaneous tissue with 1 mL of  1% lidocaine.  A 22-gauge 3-1/2 inch  spinal needle was inserted in the left S1 foramen and under fluoroscopic  guidance, AP and lateral imaging utilized.  Omnipaque 180 x4 to 5 mL  demonstrated no intravascular uptake, good epidural spread.  Then a solution  containing 1 mL of 40 mg/mL Depo-Medrol and 2 mL of 1% methylparaben-free  lidocaine were injected.  Patient tolerated the procedure well.  Post  injection instructions given.  Patient will return in three to four weeks  for re-injection.   ADDENDUM:  Patient complaining of increased neck pain.  She has had CT of  cervical spine since last visit on November 23, 2005.  It showed a solid  fusion at C4-5 with uncinate  facet hypertrophy, with mild left foraminal  stenosis.  She has had moderate left foraminal stenosis at C5-6 with facet  hypertrophy and moderate left foraminal stenosis due to facet hypertrophy at  C6-7.  She had some potential loosening and fracture of left pedicle screw  at C6,  had spoken to Dr. Lovell Sheehan regarding this and he recommended trying pain  management techniques prior to neurosurgical reevaluation.  She will follow  up with Dr. Stevphen Rochester for her cervical injections.      Erick Colace, M.D.  Electronically Signed     AEK/MEDQ  D:  12/24/2005 14:45:04  T:  12/25/2005 14:22:35  Job:  [id number]   cc:   Celene Kras, MD  Fax: [phone]   Cristi Loron, M.D.  Fax: [phone]

## 2011-02-23 NOTE — Op Note (Signed)
Emily Moran, Emily Moran NO.:  [id number]   MEDICAL RECORD NO.:  [id number]          PATIENT TYPE:  AMB   LOCATION:  DAY                          FACILITY[facility]  [facility]   PHYSICIAN:  Myrtie Neither, MD      DATE OF BIRTH:  11-06-1955   DATE OF PROCEDURE:  05/14/2005  DATE OF DISCHARGE:                                 OPERATIVE REPORT   PREOPERATIVE DIAGNOSES:  1.  Laceration open wound right little finger.  2.  Possible flexor tendon laceration, right little finger.   POSTOPERATIVE DIAGNOSES:  1.  Laceration open wound right little finger.  2.  Possible flexor tendon laceration, right little finger.  3.  Flexor tendon laceration.   ANESTHESIA:  General.   PROCEDURE:  Irrigation and debridement, delayed wound closure right little  finger.   The patient taken to the operating room, after given adequate preoperative  medication given general anesthesia and intubated. The right hand was  scrubbed with Betadine scrub and painted with Betadine solution, draped in a  sterile manner. The Bovie used for hemostasis. Transverse laceration at the  base of the right little finger was debrided. Laceration was expanded  further distally in a V fashion for more thorough debridement and irrigation  with antibiotic solution. The little finger was placed in flexion and  extension motion and it was obvious the flexor tendon would not move. After  irrigation and debridement, wound closure was then done with 0 nylon.  Compressive dressing was applied. Forearm cock-up splint was applied. The  patient tolerated the procedure quite well, went to recovery room in stable  and satisfactory condition. The patient is to continue her Keflex q.6h.  p.o., ice packs, elevation, use of sling. Return to the office in 1 week.  Will plan flexor tendon repair once the wound itself has healed and no sign  of infection. The patient tolerated the procedure quite well and went to the  recovery room in stable  and satisfactory condition, being discharged home to  continue Percocet and Keflex, ice packs and elevation.      Myrtie Neither, MD  Electronically Signed     AC/MEDQ  D:  05/14/2005  T:  05/14/2005  Job:  [id number]

## 2011-02-23 NOTE — Procedures (Signed)
Emily Moran, Emily Moran NO.:  [id number]   MEDICAL RECORD NO.:  [id number]          PATIENT TYPE:  REC   LOCATION:  TPC                          FACILITY:  [facility]   PHYSICIAN:  Erick Colace, M.D.DATE OF BIRTH:  1956/05/01   DATE OF PROCEDURE:  09/24/2005  DATE OF DISCHARGE:                                 OPERATIVE REPORT   MEDICAL RECORD NUMBER:  [id number].   PROCEDURE:  Bilateral L5 dorsal ramus injections, bilateral L4 medial branch  blocks, bilateral L3 medial branch blocks.   INDICATIONS:  Low back pain, only partially responsive to medication  management. Rated her pain as a 10/10.    The patient is contemplating surgery if conservative options are exhausted.   Informed consent was obtained after describing risks and benefits of the  procedure to the patient. These include bleeding, bruising, infection, loss  of bowel and bladder function, temporary or permanent paralysis, and she  elects to proceed and has given written consent. A 25-gauge 1-1/2-inch  needle was used to anesthetize skin and subcu tissues with 1% lidocaine at  each of 6 sites. The patient placed prone on fluoroscopy table, Betadine  prep, sterile drape.  A 22-gauge 3-1/2-inch spinal needle was inserted under  fluoroscopic guidance with a left S1 SAP sacral ala junction. Bone contact  made, confirmed with lateral imaging. Then 0.5 cc of Omnipaque 180 under  live fluoro demonstrated no intravascular uptake. Then a solution containing  0.5 cc of Depo-Medrol 40 mg/cc plus 1.5 cc of 2% methylparaben-free  lidocaine were injected. The same procedure was performed on the right side  targeting the right S1 SAP sacral ala junction, and once confirmed with  Omnipaque and multiple fluoroscopic imaging, 0.5 cc of Depo-Medrol/lidocaine  solution was injected.   Then the right L5 SAP transverse process junction was targeted. Then right  L5 SAP transverse process targeted. Bone contact made,  confirmed with  lateral imaging. Then 0.5 cc of Omnipaque 180 demonstrated no intravascular  uptake. Then 0.5 cc of Depo-Medrol/lidocaine solution injected. Then the L4  SAP transverse process junction targeted. Bone contact made, confirmed with  lateral imaging, and 0.5 cc of Omnipaque 180 under live fluoro demonstrated  no intravascular uptake. Then 0.5 cc of Depo-Medrol/lidocaine solution  injected. The C arm was obliqued 10 degrees to the left side. A 22-gauge 3-  1/2-inch spinal needle was inserted in the left SAP transverse process  junction of L4. Bone contact made, confirmed with lateral imaging, and  Omnipaque 180 x0.5 cc demonstrated no intravascular uptake. Then 0.5 cc of  the Kenalog/lidocaine solution was injected. The left L4 SAP transverse  process junction targeted. Bone contact made, confirmed with lateral  imaging, and Omnipaque 180 demonstrated no intravascular uptake. Then 0.5 cc  of Depo-Medrol/lidocaine solution was injected. Pre-injection  pain level 10/10, post-injection pain level 7/10. Pre/post injection vitals  stable. Follow up in three weeks. Have also given a prescription for Vicodin  5/500 one p.o. t.i.d.      Erick Colace, M.D.  Electronically Signed     AEK/MEDQ  D:  09/24/2005 11:46:22  T:  09/25/2005 14:47:48  Job:  [id number]   cc:   Cristi Loron, M.D.  Fax: [phone]

## 2011-02-23 NOTE — H&P (Signed)
[place]  Patient:    Emily Moran, Emily Moran                       MRN: [id number] Adm. Date:  [id number] Attending:  Tressie Stalker D                         History and Physical  CHIEF COMPLAINT: Neck pain.  HISTORY OF PRESENT ILLNESS: The patient is a 55 year old white female, who I have seen for over a year secondary to back and left leg pain.  She has a known herniated disk at L5-S1 and degenerative disk disease.  I recommended nonsurgical management for this for the time being and the patient was more or less living with her discomfort.  She tells me that on [facility] Day 2002 she began having severe neck pain with weakness in her entire body.  She said she had "trouble controlling my legs," with some numbness and tingling.  She has had chronic stress urinary incontinence, which has gotten slightly worse.  I worked her up with a cervical MRI and this demonstrated a large herniated disk at C4-5 and C5-6.  I discussed various treatment options with her including surgery and she decided to proceed with the operation.  PAST MEDICAL HISTORY:  1. Hypertension.  2. Remote history of substance abuse.  PAST SURGICAL HISTORY:  1. Tonsillectomy.  2. Laparoscopy.  3. Hysterectomy.  MEDICATIONS PRIOR TO ADMISSION: P.r.n. pain medications.  ALLERGIES:  1. ASPIRIN causes nausea.  2. MOTRIN causes nausea.  FAMILY HISTORY: The patients mother is age 86 and has emphysema.  The patients father died at age 38 secondary to stroke, congestive heart failure, and hypertension.  She has a sister with asthma.  SOCIAL HISTORY: The patient is married and has two children.  She lives in [place], [place].  She is employed as a Agricultural engineer.  She smokes one pack per day of cigarettes x approximately 25 years and I have advised her to quit, informing her the failure to do so could lead to vestibular arthrosis ______.  She denies ethanol or drug use but she  has abused drugs in the past.  REVIEW OF SYSTEMS: Negative except as above.  PHYSICAL EXAMINATION:  GENERAL: Pleasant 55 year old white female.  VITAL SIGNS: Height 5 feet 7 inches.  Weight 130 pounds.  HEENT: Normocephalic.  Dentition in poor repair.  NECK: Supple.  No masses, deformities.  Spurlings test positive on the right, negative on the left.  Lhermittes sign not present.  THORAX: Symmetric.  CHEST: Lungs clear to auscultation.  HEART: Regular rate and rhythm.  ABDOMEN: Soft, nontender.  BACK: Diffusely tender to palpation.  NEUROLOGIC: Straight leg raising is positive on the left, negative on the right.  Fabere testing positive on the left, negative on the right.  The patient is alert and oriented x 3.  Cranial nerves 2-12 grossly intact.  Motor strength 5/5 in bilateral deltoid, biceps, triceps, wrist extensor, interosseous psoas, quadriceps, gastrocnemius, and extensor hallucis longus; 4-5/5 in bilateral hands.  Deep tendon reflexes 2/4 in bilateral biceps, triceps, brachial radialis; 1-2/4 in bilateral quadriceps and gastrocnemius. No active clonus in bilateral flexor or plantar reflexes.  Sensory examination grossly normal.  IMAGING STUDIES: Cervical MRI performed without contrast at [facility] on Mar 06, 2001 demonstrated degenerative disk disease of cervical spine with large herniated disk at C4-5 and moderate sized herniated disk at C5-6,  with some compression of the spinal cord.  ASSESSMENT: C4-5 and C5-6 herniated nucleus pulposus with degenerative disk disease and spinal stenosis with cervical radiculopathy and cervical myelopathy.  PLAN: I discussed the situation with the patient and reviewed her MRI scan with her, pointing out the abnormalities.  I told her she has significant herniated disk at C4-5 and a moderate one at C5-6 with some compression of her spinal cord at both levels.  I discussed various treatment options with  her including doing nothing, continuing medical management, and surgery.  I have recommended the latter and recommended she consider a C4-5 and C5-6 anterior cervical diskectomy with interbody iliac crest allograft arthrodesis and anterior cervical plating.  I described the surgery to her and showed her surgical models.  I discussed the risks of surgery extensively.  The patient has weighed the risks and benefits and alternatives of surgery and wishes to proceed with the operation on March 20, 2001. DD:  03/20/01 TD:  03/21/01 Job: [id number]

## 2011-02-23 NOTE — Discharge Summary (Signed)
NAMEHAZELY, SEALEY NO.:  [id number]   MEDICAL RECORD NO.:  [id number]          PATIENT TYPE:  IPS   LOCATION:  0502                          [facility]ACILITY:  [facility]   PHYSICIAN:  Anselm Jungling, MD  DATE OF BIRTH:  Aug 07, 1956   DATE OF ADMISSION:  07/23/2007  DATE OF DISCHARGE:  07/29/2007                               DISCHARGE SUMMARY   IDENTIFYING DATA/REASON FOR ADMISSION:  This was an inpatient  psychiatric admission for Emily Moran, a 55 year old single white female who  presented requesting opiate detoxification.  She reported that she had  been getting opiate analgesic prescriptions from her medical doctor.  When her medical doctor refused to prescribe any further, the patient  resorted to getting heroin off the street.  Please refer to the  admission note for further details pertaining to the symptoms,  circumstances and history that led to her hospitalization.   INITIAL DIAGNOSTIC IMPRESSION:  She was given an initial AXIS I  diagnosis of opiate dependence.   MEDICAL/LABORATORY:  The patient was medically and physically assessed  by the psychiatric nurse practitioner.  Although she did not look  healthy, she did not have any chronic medical problems.  She was felt to  have bronchitis and sinusitis and was treated for this with Avelox and  Flovent.  There were no other significant medical issues.   HOSPITAL COURSE:  The patient was admitted to the adult inpatient  psychiatric service.  She presented as a normally developed, but  chronically ill-appearing and thin woman, who appeared older than her  stated age.  She was alert, fully oriented, and cooperative.  There were  no signs or symptoms of psychosis or delirium.  She wanted help, and  indicated that she wanted to detox from heroin and perhaps eventually  get on methadone maintenance.  She complained of being in pain.   She was placed on a clonidine protocol for heroin withdrawal.  This  proceeded  relatively uneventfully, but the patient had frequent  complaints of pain, with multiple requests for pain management.  We  indicated to her that we would not provide any form of narcotic  analgesia, but would provide any reasonable trial of nonnarcotic pain  relief.  She was not interested in these alternatives.   By the third hospital day, the patient was continuing to insist that she  needed something stronger for her pain.  She stated my leg is killing  me.  What am I supposed to do?  As soon as I get out of here, I'll  get some.  She was referring to heroin.  Again, it was emphasized to  the patient that we would not provide any opiate analgesics.  The  patient was given a choice of continuing with our treatment plan  involving full opiate detoxification with plan for long-term abstinence  or, if the patient disagreed with his plan, that we would be willing to  discharge her.  She agreed to stay.   The patient did better following this, and attended therapeutic groups  and activities, including those geared towards 12-step recovery.  She  worked with the casemanager towards a discharge plan.  She wanted to  approach [facility], where she apparently had been under  methadone maintenance in the past.  She wanted to reapply for methadone  maintenance.  We learned that the patient was required to appear before  the [facility] treatment board to present her request for  methadone maintenance.  This was to occur on the morning of July 30, 2007.  As such, the patient was discharged on July 29, 2007.  She had  completed her opiate detoxification protocol.   AFTERCARE:  The patient was to go to [facility] on July 30, 2007 at 8:30  a.m.   DISCHARGE MEDICATIONS:  1. Cymbalta 30 mg daily.  2. Avelox 400 mg at 6 p.m., last dose August 04, 2007.  3. Flovent 2 puffs twice daily.  4. Seroquel 50 mg q.6h. as needed for agitation.   DISCHARGE DIAGNOSES:  AXIS  I:  Opiate dependence, early remission.  Depressive disorder not otherwise specified.  AXIS II:  Deferred.  AXIS III:  Bronchitis.  AXIS IV:  Stressors:  Severe.  AXIS V:  GAF on discharge 55.      Anselm Jungling, MD  Electronically Signed     SPB/MEDQ  D:  08/05/2007  T:  08/05/2007  Job:  [id number]

## 2011-02-23 NOTE — Op Note (Signed)
[place]  Patient:    Emily Moran, Emily Moran                       MRN: [id number] Proc. Date: 03/20/01 Adm. Date:  [id number] Attending:  Tressie Stalker D                           Operative Report  HISTORY OF PRESENT ILLNESS:  The patient is a 55 year old white female who I have seen for over a year secondary to back pain and degenerative disc disease in her lumbar spine.  She began having severe pain in her neck with "trouble controlling my legs".  I worked her up with cervical MRI that demonstrated a large herniated disc at C4/C5 and a more moderate herniated disc at C5/C6.  I recommended she considered surgery.  The patient had weighed the risks, benefits, and alternatives of surgery and decided to proceed with a two-level anterior diskectomy, fusion, and plating.  PREOPERATIVE DIAGNOSES:  C4/C5 and C5/C6 herniated nucleus pulposus, spinal stenosis, cervical myelopathy, cervicalgia.  POSTOPERATIVE DIAGNOSES:  C4/C5 and C5/C6 herniated nucleus pulposus, spinal stenosis, cervical myelopathy, cervicalgia.  PROCEDURE:  A C4/C5 and C5/C6 extensive anterior cervical diskectomy, interbody iliac crest allograft arthrodesis, anterior cervical plating (Codman titanium anterior plate and screws.)  SURGEON:  Cristi Loron, M.D.  ASSISTANT:  Tanya Nones. Jeral Fruit, M.D.  ANESTHESIA:  General endotracheal.  ESTIMATED BLOOD LOSS:  Less than 100 cc.  SPECIMENS:  None.  DRAINS:  None.  COMPLICATIONS:  None.  DESCRIPTION OF PROCEDURE:  The patient was brought to the operating room by the anesthesia team.  General endotracheal anesthesia was induced.  The patient remained in the supine position and a roll was placed under her shoulders to place the neck in slight extension.  Her anterior cervical region was then prepared with Betadine scrub with Betadine solution.  Sterile drapes were applied, and I injected the area to be incised with Marcaine  with epinephrine solution and used a scalpel to make a transverse incision on the patients left anterior neck.  I used the Metzenbaum scissors to divide the platysma muscle and then dissect medial to the sternocleidomastoid muscle, jugular vein, and carotid artery.  I bluntly dissected down towards the anterior cervical spine and carefully identifying the esophagus and retracting it medially.  I then used a Kitner swab to clear the soft tissue from the anterior cervical spine and then started a bent spinal needle into one of the exposed interspaces.  I obtained an intraoperative radiograph to confirm my location and it  demonstrated a needle was in C5/C6.  I then used electrocautery to detach the medial border of the longus coli bilaterally from the anterior cervical spine C5/C6 and C4/C5 from intervertebral disc space.  I inserted the McCullough retractor for exposure and then used the 15-blade scalpel to incise the C4/C5 intervertebral disc. I performed a partial diskectomy with the pituitary forceps and the Carlens curette.  I inserted distraction screws in C4/C5, distracting the interspace and then used the high-speed drill to decorticate the C4/C5 intervertebral disc space, and then drill away remainder of C4/C5 intervertebral disc.  I thinned out the posterior and longitudinal ring with the drill and then incised it with the arachnoid knife and removed it with a Kerrison punch. There was a large central and left-sided herniated disc at this level, and I removed it with Kerrison punch and the pituitary  forceps, decompressing the thecal sac.  I then performed a foraminotomy about C5 nerve root and  undercut the vertebral endplates at C4 and C5.  I then repeated the procedure at C5/C6, i.e., I removed the distraction screw at C4, placing at C6, distracting C5/C6, incised the intervertebral disc, and removed it with pituitary forceps and Carlens curette.  I used the high-speed drill to  decorticate the vertebral endplates and placed to drill away the remainder of the intervertebral disc, thinned out the posterior and longitudinal ligament, and then incised the ligament with the arachnoid knife and remove with the Kerrison punch, undercutting the vertebral endplates at C5/C6, decompressing the thecal sac.  I performed a foraminotomy about the C6 nerve root.  Having completed the anterior cervical diskectomy, I now turned my attention to arthrodesis.  I obtained the iliac crest or cortical allograft bone graft and fashioned to each these approximate dimensions:  7 mm in height and 1 cm in depth.  I inserted one bone graft at the C5/C6 interspace, removed the the distraction screw from C6 and placed it back in C4, distracted C4/C5, and put the other bone graft into C4/C5 interspace.  I removed the distraction screw and there was a good snug fit of the bone graft at both levels.  I now turned my attention to the anterior spinal instrumentation.  I obtained the appropriate Codman anterior cervical plate, laid along the anterior aspect of the vertebral body from C4 and to C6, drilled two holes to each level, and then put the plates to the vertebral bodies by placing two screws at C4, C5, and C6. I then obtained an intraoperative radiograph that demonstrated good position of the plate, screws, and interbody from C4 to C6.  I then secured the screws to the plate by using the CAM tightener.  I then copiously irrigated the wound with bacitracin solution, removed the solution, and I achieved stringent hemostasis with bipolar cautery and Gelfoam. I then removed the Caspar retractors and then inspected the esophagus for any damage.  There was none evident.  I then reapproximated the patients platysma muscle with interrupted 3-0 Vicryl sutures and subcutaneous tissue with interrupted 3-0 Vicryl suture, and the skin with Steri-Strips, and benzoin.  The wound was then covered with  Bacitracin ointment and sterile dressing applied.  The drapes were removed and the patient was subsequently extubated by the anesthesia team and transported to the postanesthesia care unit in stable condition.   All sponge, instrument, and needle counts were correct at the end of this case. DD:  03/20/01 TD:  03/21/01 Job: [id number]
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
## 2011-02-23 NOTE — Discharge Summary (Signed)
[facility]  Patient:    Emily Moran, Emily Moran                       MRN: [id number] Adm. Date:  [id number] Disc. Date: [id number] Attending:  Madaline Guthrie Dictator:   Gillian Shields, M.D. CC:         [facility]  Cristi Loron, M.D.   Discharge Summary  DATE OF BIRTH:  2056/02/26  DISCHARGE DIAGNOSES: 1. Gastritis. 2. Chronic back pain. 3. History of tobacco and substance abuse.  DISCHARGE MEDICATIONS: 1. Hydrocodone to be used as directed by Cristi Loron, M.D. 2. Phenergan 12.5 mg one to two p.o. q.4-6h. p.r.n. nausea. 3. Cimetidine 400 mg p.o. b.i.d.  PROCEDURES PERFORMED: 1. Abdominal and pelvic CT. 2. Abdominal ultrasound.  CONSULTATIONS:  None.  ADMITTING HISTORY AND PHYSICAL:  This 55 year old Caucasian female presented to the emergency department with a chief complaint of abdominal pain.  She denied the pain as beginning with awakening at 7:30 a.m. on the day of admission.  She described it as constant, sharp, and localizing to the left upper quadrant, accompanied by several episodes of nonbilious, nonbloody vomiting, nausea, and three episodes of diarrhea, which was nonbloody with no mucus.  The pain was 10/10 in intensity.  She describes no exacerbating and no relieving factors.  She has never experienced this previously.  Initially her abdominal exam indicated diffuse pain, but after she received morphine 2 mg in the emergency department, she described it as localizing to the left upper quadrant only.  REVIEW OF SYSTEMS:  Negative for weakness, fever, chills, headache, sore throat, visual changes, dizziness, cough, dyspnea, chest pain, constipation, rash, or joint pain.  PAST MEDICAL HISTORY:  Significant for a lumbar myelogram performed in June of 2001 for chronic back pain, a remote history of morphine abuse, and status post hysterectomy approximately one year ago.  ALLERGIES:  Question of ASPIRIN  allergy.  MEDICATIONS:  Hydrocodone, dose unknown.  SOCIAL HISTORY:  The patient denies alcohol use.  Positive tobacco use of one pack per day.  She lives with her husband in [place], [place], and is employed as a Lawyer in a nursing home.  FAMILY HISTORY:  Significant for kidney cancer in her mother.  Father deceased of a CVA at age 58.  The patients husband and daughter are both healthy.  PHYSICAL EXAMINATION:  Temperature 97.0 degrees, heart rate 52, respirations 22, blood pressure 154/92, oxygen saturation 99% on room air.  GENERAL APPEARANCE:  An anxious-appearing, thin, Caucasian female, twisting and moaning in fetal position.  HEENT:  Normocephalic and atraumatic.  PERRL.  Extraocular movements intact. Oral mucosa moist.  No lesions.  CARDIOVASCULAR:  Regular rate and rhythm.  CHEST:  Clear to auscultation bilaterally.  ABDOMEN:  Soft and nontender with hypoactive bowel sounds and a diffuse tenderness throughout which localized to the left upper quadrant.  No rebound. Positive voluntary guarding.  EXTREMITIES:  2+ pulses.  No edema.  No cyanosis.  SKIN:  Warm and dry.  NEUROLOGIC:  Sensory and motor grossly intact.  PELVIC:  Normal external female genitalia.  No lesions.  Mucus pink and healthy appearing.  RECTAL:  Good tone.  Hemoccult negative.  ADMISSION LABORATORIES:  Sodium 135, potassium 3.8, chloride 107, CO2 21, BUN 9, creatinine 0.5, glucose 124, calcium 8.9, total protein 7.4, albumin 3.8, AST 22, ALT 13, alkaline phosphatase 113, total bilirubin 0.7, amylase 54, lipase 22.  White count 9.2, hemoglobin 12.8, hematocrit  36.4, platelets 296, absolute neutrophil count 7.6 (within normal limits).  An EKG showed Ms. Dolder to be in sinus bradycardia.  There were no Q waves and no ST-T segment changes.  The urinalysis showed trace ketones, mildly increased urobilinogen, and otherwise was normal.  Wet prep was normal.  No bacteria, yeast, or Trichomonas  identified.  Abdominal and pelvic CT initially read as possible ileus versus small bowel obstruction and later read as within normal limits with no indications for obstruction or mass.  Urine drug screen positive for opiates, cocaine, and marijuana.  The urine culture was negative.  GC and chlamydia were negative.  The patient was admitted for rehydration, treated for nausea and abdominal pain, and received serial abdominal exams.  HOSPITAL COURSE: #1 - ABDOMINAL PAIN AND NAUSEA:  Ms. Humbles vomiting resolved with Phenergan and she received morphine for pain, which greatly decreased her agitation. She remained afebrile with stable vitals throughout the hospitalization.  On July 11, 2000, her white count increased to 14.9, but the balance of her electrolytes remained normal.  She was NPO at this point.  A gallbladder ultrasound was ordered, which was negative for gallstones, gallbladder thickening or sludging, or biliary dilatation.  Her pain remained localized to the left upper quadrant, however, she was not in acute distress and did not experience any recurrence of the vomiting or diarrhea.  Serial Hemoccults were negative.  A repeat CBC on the date of discharge showed a white count of 10.4. Having failed to identify an immediate cause of the patients abdominal pain, I am electing to treat her with a trial of H2 blocker for presumed gastroenteritis.  She will follow up with me in one week in my clinic.  #2 - HYPOKALEMIA:  Following admission, the patient was repleted with potassium chloride in her IV fluids.  The day following admission, her potassium was 3.5.  This transient hypokalemia was probably secondary to repeated episodes of vomiting on the day of admission.  #3 - HISTORY OF SUBSTANCE ABUSE:  The patients urine drug screen was positive for opiates, cocaine, and marijuana.  Initially she denied any substance  abuse, although she was very up front about having had an  opiate abuse problem in the past.  She stated that she had taken one hydrocodone the night prior to admission, which would explain the opiates.  She denies any history of cocaine use, however, acknowledges that she has smoked marijuana frequently and recently and does not consider this to be a drug of abuse.  Her history of substance abuse suggests two possible etiologies of her abdominal pain, one is narcotic withdrawn as her pain did initially respond well to morphine. However, this does not explain her elevated white count and the localization of the pain into the left upper quadrant.  Another consideration is possible cocaine-induced bowel ischemia.  I discussed with the patient the potential dangers of this condition and strongly encouraged her to seek substance abuse treatment at this point.  She denied, but will continue to follow up with me in the clinic and I will pursue this issue aggressively in the future.  DISCHARGE LABORATORIES:  CBC:  White blood cell count 10.4, hemoglobin 13.1, hematocrit 38.9, platelets 242.  Sodium 139, potassium 4.4, chloride 105, CO2 25, BUN 8, creatinine 0.5, glucose 99.  Total bilirubin 0.5, alkaline phosphatase 99, AST 16, ALT 11, total protein 6.4, albumin 3.4, calcium 9.5. Blood cultures are negative at 48 hours.  Final report still pending.  Stool cultures which  were ordered are still pending due to lack of a sample.  CONDITION ON DISCHARGE:  At the time of discharge, the patient is stable.  She is tolerating a clear liquid diet without difficulty.  DISCHARGE MEDICATIONS:  She will be discharged home on Phenergan for control of her nausea and cimetidine as gastritis prophylaxis, as well as continuing a bland diet over the next two to three days.  FOLLOW-UP:  She will follow up with me at the [facility] next week. DD:  07/12/00 TD:  07/13/00 Job: [id number]

## 2011-02-26 ENCOUNTER — Other Ambulatory Visit: Payer: Self-pay | Admitting: Orthopedic Surgery

## 2011-02-26 DIAGNOSIS — M542 Cervicalgia: Secondary | ICD-10-CM

## 2011-02-26 DIAGNOSIS — M545 Low back pain: Secondary | ICD-10-CM

## 2011-02-26 DIAGNOSIS — M479 Spondylosis, unspecified: Secondary | ICD-10-CM

## 2011-03-15 ENCOUNTER — Encounter: Payer: Self-pay | Admitting: Internal Medicine

## 2011-03-27 ENCOUNTER — Emergency Department (HOSPITAL_COMMUNITY)
Admission: EM | Admit: 2011-03-27 | Discharge: 2011-03-27 | Disposition: A | Discharge status: Home / Self Care | Payer: Medicare Other | Attending: Emergency Medicine | Admitting: Emergency Medicine

## 2011-03-27 ENCOUNTER — Emergency Department (HOSPITAL_COMMUNITY): Payer: Medicare Other

## 2011-03-27 DIAGNOSIS — J4489 Other specified chronic obstructive pulmonary disease: Secondary | ICD-10-CM | POA: Insufficient documentation

## 2011-03-27 DIAGNOSIS — K219 Gastro-esophageal reflux disease without esophagitis: Secondary | ICD-10-CM | POA: Insufficient documentation

## 2011-03-27 DIAGNOSIS — R109 Unspecified abdominal pain: Secondary | ICD-10-CM | POA: Insufficient documentation

## 2011-03-27 DIAGNOSIS — R195 Other fecal abnormalities: Secondary | ICD-10-CM | POA: Insufficient documentation

## 2011-03-27 DIAGNOSIS — K2990 Gastroduodenitis, unspecified, without bleeding: Secondary | ICD-10-CM | POA: Insufficient documentation

## 2011-03-27 DIAGNOSIS — J449 Chronic obstructive pulmonary disease, unspecified: Secondary | ICD-10-CM | POA: Insufficient documentation

## 2011-03-27 DIAGNOSIS — F17200 Nicotine dependence, unspecified, uncomplicated: Secondary | ICD-10-CM | POA: Insufficient documentation

## 2011-03-27 DIAGNOSIS — K279 Peptic ulcer, site unspecified, unspecified as acute or chronic, without hemorrhage or perforation: Secondary | ICD-10-CM | POA: Insufficient documentation

## 2011-03-27 DIAGNOSIS — K2970 Gastritis, unspecified, without bleeding: Secondary | ICD-10-CM | POA: Insufficient documentation

## 2011-03-27 LAB — POCT I-STAT, CHEM 8
Calcium, Ion: 1.13 mmol/L (ref 1.12–1.32)
Glucose, Bld: 98 mg/dL (ref 70–99)
HCT: 44 % (ref 36.0–46.0)
Hemoglobin: 15 g/dL (ref 12.0–15.0)
TCO2: 22 mmol/L (ref 0–100)

## 2011-03-27 LAB — SAMPLE TO BLOOD BANK

## 2011-03-27 LAB — CBC
MCHC: 35.3 g/dL (ref 30.0–36.0)
RDW: 13.4 % (ref 11.5–15.5)

## 2011-05-03 ENCOUNTER — Encounter: Payer: Medicare Other | Admitting: Internal Medicine

## 2011-05-14 ENCOUNTER — Ambulatory Visit (INDEPENDENT_AMBULATORY_CARE_PROVIDER_SITE_OTHER): Payer: Medicare Other | Admitting: Internal Medicine

## 2011-05-14 ENCOUNTER — Encounter: Payer: Self-pay | Admitting: Internal Medicine

## 2011-05-14 DIAGNOSIS — J4489 Other specified chronic obstructive pulmonary disease: Secondary | ICD-10-CM

## 2011-05-14 DIAGNOSIS — F17200 Nicotine dependence, unspecified, uncomplicated: Secondary | ICD-10-CM

## 2011-05-14 DIAGNOSIS — Z0000 Encounter for general adult medical examination without abnormal findings: Secondary | ICD-10-CM

## 2011-05-14 DIAGNOSIS — J449 Chronic obstructive pulmonary disease, unspecified: Secondary | ICD-10-CM

## 2011-05-14 DIAGNOSIS — R1013 Epigastric pain: Secondary | ICD-10-CM

## 2011-05-14 DIAGNOSIS — R109 Unspecified abdominal pain: Secondary | ICD-10-CM

## 2011-05-14 LAB — CBC
HCT: 42.6 % (ref 36.0–46.0)
MCHC: 33.8 g/dL (ref 30.0–36.0)
RBC: 4.65 MIL/uL (ref 3.87–5.11)
RDW: 13.7 % (ref 11.5–15.5)

## 2011-05-14 MED ORDER — NICOTINE POLACRILEX 2 MG MT GUM: 2.0000 mg | CHEWING_GUM | OROMUCOSAL | Status: AC | PRN

## 2011-05-14 MED ORDER — PANTOPRAZOLE SODIUM 40 MG PO TBEC: 40.0000 mg | DELAYED_RELEASE_TABLET | Freq: Every day | ORAL | Status: DC

## 2011-05-14 NOTE — Patient Instructions (Signed)
Please schedule a follow up appointment in 1 month. Please take your medicines as prescribed. 

## 2011-05-14 NOTE — Progress Notes (Signed)
  Subjective:    Patient ID: Emily Moran, female    DOB: [birth year], 55 y.o.   MRN: [id number]  HPI: 55 year old woman with past medical history significant for COPD, tobacco abuse comes to the clinic for abdominal pain.  She was seen in the ER on 03/27/2011 for abdominal pain and had positive FOBT- this was thought likely to be secondary to gastritis and she was asked to followup in our clinic.  She returns today and complains of an episode of similar abdominal pain again a week ago associated with some nausea and vomiting that lasted for 2 days.  She describes her pain as a burning located in the epigastrium along with some stabbing discomfort in the left upper quadrant.  She took some Goody powders which helped her. She states that today is one of her better days and denies any abdominal pain, nausea or vomiting or diarrhea.     Review of Systems  Constitutional: Negative for chills, activity change and appetite change.  HENT: Negative for nosebleeds, congestion, rhinorrhea, sneezing and postnasal drip.   Eyes: Negative for visual disturbance.  Respiratory: Negative for cough, choking, shortness of breath, wheezing and stridor.   Cardiovascular: Negative for chest pain, palpitations and leg swelling.  Gastrointestinal: Negative for abdominal distention.  Genitourinary: Negative for dysuria and flank pain.  Musculoskeletal: Negative for arthralgias.  Neurological: Negative for light-headedness and headaches.       Objective:   Physical Exam  Constitutional: She is oriented to person, place, and time. She appears well-developed and well-nourished. No distress.  HENT:  Head: Normocephalic and atraumatic.  Eyes: Conjunctivae and EOM are normal. Pupils are equal, round, and reactive to light. Right eye exhibits no discharge. Left eye exhibits no discharge.  Neck: Normal range of motion. Neck supple. No JVD present. No tracheal deviation present. No thyromegaly present.  Cardiovascular:  Normal rate, regular rhythm, normal heart sounds and intact distal pulses.  Exam reveals no gallop and no friction rub.   No murmur heard. Pulmonary/Chest: Effort normal and breath sounds normal. No stridor. No respiratory distress. She has no wheezes. She has no rales. She exhibits no tenderness.  Abdominal: Soft. Bowel sounds are normal. She exhibits no distension. There is no tenderness. There is no rebound and no guarding.  Musculoskeletal: Normal range of motion. She exhibits no edema and no tenderness.  Lymphadenopathy:    She has no cervical adenopathy.  Neurological: She is alert and oriented to person, place, and time. She has normal reflexes. She displays normal reflexes. No cranial nerve deficit. She exhibits normal muscle tone. Coordination normal.  Skin: Skin is warm. She is not diaphoretic.          Assessment & Plan:

## 2011-05-15 ENCOUNTER — Encounter: Payer: Self-pay | Admitting: Internal Medicine

## 2011-05-15 DIAGNOSIS — Z0000 Encounter for general adult medical examination without abnormal findings: Secondary | ICD-10-CM | POA: Insufficient documentation

## 2011-05-15 DIAGNOSIS — R109 Unspecified abdominal pain: Secondary | ICD-10-CM | POA: Insufficient documentation

## 2011-05-15 NOTE — Assessment & Plan Note (Signed)
Stable  Continue current regimen  

## 2011-05-15 NOTE — Assessment & Plan Note (Signed)
Will refer her for colonoscopy today.

## 2011-05-15 NOTE — Assessment & Plan Note (Signed)
She was counseled on smoking cessation. She states she has cut down from 2 packs a day to 2 packs per week. And she says that she wants to quit smoking. Will give her a prescription for nicotine gums at this time. She did not want to try Chantix as she was worried about the side effects from it. We may consider getting a Child psychotherapist consult on her followup visit depending on her motivation.

## 2011-05-15 NOTE — Assessment & Plan Note (Signed)
Differentials for her abdominal pain include gastritis versus peptic ulcer disease versus gastroenteritis ( as this episode just lasted for 2 days associated with some nausea and vomiting). Given her history of tobacco abuse and  Goody powder,  gastritis and peptic ulcer disease are also high on the differential. We'll start her on proton pump inhibitor. She was advised to followup with [place] in one month. If the pain persists or if she has more episodes then we may consider getting an EGD for further evaluation.

## 2011-05-20 ENCOUNTER — Emergency Department (HOSPITAL_COMMUNITY)
Admission: EM | Admit: 2011-05-20 | Discharge: 2011-05-20 | Discharge status: Against Medical Advice | Payer: Medicare Other | Attending: Emergency Medicine | Admitting: Emergency Medicine

## 2011-05-20 DIAGNOSIS — K921 Melena: Secondary | ICD-10-CM | POA: Insufficient documentation

## 2011-05-20 DIAGNOSIS — R109 Unspecified abdominal pain: Secondary | ICD-10-CM | POA: Insufficient documentation

## 2011-05-30 ENCOUNTER — Telehealth: Payer: Self-pay | Admitting: *Deleted

## 2011-05-30 NOTE — Telephone Encounter (Signed)
Agree-PCP to complete forms for PA

## 2011-05-30 NOTE — Telephone Encounter (Signed)
Call for Prior Authorization for Pantoprazole.  Pt was called and said that she has been on Nexium and Prilosec in the past with no relief.  Call for PA forms will need to be sent for the Provider to complete.

## 2011-06-05 ENCOUNTER — Other Ambulatory Visit: Payer: Self-pay | Admitting: Gastroenterology

## 2011-06-08 ENCOUNTER — Emergency Department (HOSPITAL_COMMUNITY)
Admission: EM | Admit: 2011-06-08 | Discharge: 2011-06-08 | Disposition: A | Discharge status: Home / Self Care | Payer: Medicare Other | Attending: Emergency Medicine | Admitting: Emergency Medicine

## 2011-06-08 DIAGNOSIS — R109 Unspecified abdominal pain: Secondary | ICD-10-CM | POA: Insufficient documentation

## 2011-06-08 DIAGNOSIS — J4489 Other specified chronic obstructive pulmonary disease: Secondary | ICD-10-CM | POA: Insufficient documentation

## 2011-06-08 DIAGNOSIS — J449 Chronic obstructive pulmonary disease, unspecified: Secondary | ICD-10-CM | POA: Insufficient documentation

## 2011-06-08 DIAGNOSIS — R112 Nausea with vomiting, unspecified: Secondary | ICD-10-CM | POA: Insufficient documentation

## 2011-06-08 DIAGNOSIS — K219 Gastro-esophageal reflux disease without esophagitis: Secondary | ICD-10-CM | POA: Insufficient documentation

## 2011-06-08 DIAGNOSIS — R10819 Abdominal tenderness, unspecified site: Secondary | ICD-10-CM | POA: Insufficient documentation

## 2011-06-08 DIAGNOSIS — Z79899 Other long term (current) drug therapy: Secondary | ICD-10-CM | POA: Insufficient documentation

## 2011-06-08 DIAGNOSIS — K625 Hemorrhage of anus and rectum: Secondary | ICD-10-CM | POA: Insufficient documentation

## 2011-06-08 LAB — CBC
MCH: 30.9 pg (ref 26.0–34.0)
MCHC: 33.6 g/dL (ref 30.0–36.0)
Platelets: 250 10*3/uL (ref 150–400)

## 2011-06-08 LAB — URINALYSIS, ROUTINE W REFLEX MICROSCOPIC
Glucose, UA: NEGATIVE mg/dL
Ketones, ur: NEGATIVE mg/dL
Leukocytes, UA: NEGATIVE
Protein, ur: NEGATIVE mg/dL

## 2011-06-08 LAB — COMPREHENSIVE METABOLIC PANEL
ALT: 27 U/L (ref 0–35)
AST: 32 U/L (ref 0–37)
Calcium: 9.1 mg/dL (ref 8.4–10.5)
Creatinine, Ser: 0.85 mg/dL (ref 0.50–1.10)
GFR calc Af Amer: >60 mL/min (ref >60)
Glucose, Bld: 105 mg/dL — ABNORMAL HIGH (ref 70–99)
Sodium: 138 mEq/L (ref 135–145)
Total Protein: 7 g/dL (ref 6.0–8.3)

## 2011-06-08 LAB — DIFFERENTIAL
Basophils Relative: 0 % (ref 0–1)
Eosinophils Absolute: 0.1 10*3/uL (ref 0.0–0.7)
Monocytes Absolute: 0.6 10*3/uL (ref 0.1–1.0)
Monocytes Relative: 5 % (ref 3–12)

## 2011-06-08 LAB — OCCULT BLOOD, POC DEVICE: Fecal Occult Bld: POSITIVE

## 2011-06-14 ENCOUNTER — Inpatient Hospital Stay: Admission: RE | Admit: 2011-06-14 | Payer: Medicare Other | Source: Ambulatory Visit

## 2011-06-22 ENCOUNTER — Ambulatory Visit: Admission: RE | Admit: 2011-06-22 | Payer: Medicare Other | Source: Ambulatory Visit

## 2011-06-22 ENCOUNTER — Other Ambulatory Visit: Payer: Self-pay | Admitting: Gastroenterology

## 2011-06-22 ENCOUNTER — Ambulatory Visit
Admission: RE | Admit: 2011-06-22 | Discharge: 2011-06-22 | Disposition: A | Discharge status: Home / Self Care | Payer: Medicare Other | Source: Ambulatory Visit | Attending: Gastroenterology | Admitting: Gastroenterology

## 2011-06-27 ENCOUNTER — Other Ambulatory Visit: Payer: Self-pay | Admitting: *Deleted

## 2011-06-28 MED ORDER — FLUTICASONE-SALMETEROL 250-50 MCG/DOSE IN AEPB: 1.0000 | INHALATION_SPRAY | Freq: Two times a day (BID) | RESPIRATORY_TRACT | Status: DC

## 2011-07-02 ENCOUNTER — Telehealth: Payer: Self-pay | Admitting: *Deleted

## 2011-07-02 ENCOUNTER — Emergency Department (HOSPITAL_COMMUNITY)
Admission: EM | Admit: 2011-07-02 | Discharge: 2011-07-02 | Discharge status: Against Medical Advice | Payer: Medicare Other | Attending: Pediatric Emergency Medicine | Admitting: Pediatric Emergency Medicine

## 2011-07-02 DIAGNOSIS — R109 Unspecified abdominal pain: Secondary | ICD-10-CM | POA: Insufficient documentation

## 2011-07-02 LAB — URINALYSIS, ROUTINE W REFLEX MICROSCOPIC
Hgb urine dipstick: NEGATIVE
Specific Gravity, Urine: 1.011 (ref 1.005–1.030)
Urobilinogen, UA: 0.2 mg/dL (ref 0.0–1.0)

## 2011-07-02 LAB — URINE MICROSCOPIC-ADD ON

## 2011-07-02 NOTE — Telephone Encounter (Signed)
Pt called stating we sent her to Dr Bosie Clos about 1 month ago. 2 weeks ago she had CT and was found to have kidney stone.  She is having back pain on rt side.  She has been having pain for over 2 weeks. No blood noted in urine. No relief from pain with pain meds.   She goes to pain clinic and gets roxicodone 15 mg every 6 hours.  Pt advised to go to ED for evaluation now as she states she can't stand the pain any longer.  To late for [place] to see her in clinic.  Pt asked to call us back if she needs referral to urology unless this is done in ED.

## 2011-07-02 NOTE — Telephone Encounter (Signed)
Agree with plan 

## 2011-07-03 ENCOUNTER — Emergency Department (HOSPITAL_COMMUNITY)
Admission: EM | Admit: 2011-07-03 | Discharge: 2011-07-03 | Disposition: A | Discharge status: Home / Self Care | Payer: Medicare Other | Attending: Emergency Medicine | Admitting: Emergency Medicine

## 2011-07-03 DIAGNOSIS — F17200 Nicotine dependence, unspecified, uncomplicated: Secondary | ICD-10-CM | POA: Insufficient documentation

## 2011-07-03 DIAGNOSIS — R109 Unspecified abdominal pain: Secondary | ICD-10-CM | POA: Insufficient documentation

## 2011-07-03 DIAGNOSIS — K219 Gastro-esophageal reflux disease without esophagitis: Secondary | ICD-10-CM | POA: Insufficient documentation

## 2011-07-03 DIAGNOSIS — M549 Dorsalgia, unspecified: Secondary | ICD-10-CM | POA: Insufficient documentation

## 2011-07-03 DIAGNOSIS — G8929 Other chronic pain: Secondary | ICD-10-CM | POA: Insufficient documentation

## 2011-07-03 LAB — POCT I-STAT, CHEM 8
Calcium, Ion: 1.15 mmol/L (ref 1.12–1.32)
Creatinine, Ser: 0.9 mg/dL (ref 0.50–1.10)
Glucose, Bld: 81 mg/dL (ref 70–99)
HCT: 46 % (ref 36.0–46.0)
Hemoglobin: 15.6 g/dL — ABNORMAL HIGH (ref 12.0–15.0)
Potassium: 4.7 mEq/L (ref 3.5–5.1)
TCO2: 20 mmol/L (ref 0–100)

## 2011-07-03 LAB — URINALYSIS, ROUTINE W REFLEX MICROSCOPIC
Bilirubin Urine: NEGATIVE
Hgb urine dipstick: NEGATIVE
Ketones, ur: NEGATIVE mg/dL
Nitrite: NEGATIVE
Protein, ur: NEGATIVE mg/dL
Specific Gravity, Urine: 1.009 (ref 1.005–1.030)
Urobilinogen, UA: 0.2 mg/dL (ref 0.0–1.0)

## 2011-07-04 LAB — URINE CULTURE: Culture: NO GROWTH

## 2011-07-11 LAB — CBC
HCT: 44.8
Hemoglobin: 15.1 — ABNORMAL HIGH
RBC: 5.16 — ABNORMAL HIGH
RDW: 14.3
WBC: 14.8 — ABNORMAL HIGH

## 2011-07-11 LAB — DIFFERENTIAL
Basophils Absolute: 0.1
Eosinophils Relative: 0
Lymphocytes Relative: 15
Lymphs Abs: 2.2
Monocytes Absolute: 0.4
Neutro Abs: 12.1 — ABNORMAL HIGH

## 2011-07-11 LAB — COMPREHENSIVE METABOLIC PANEL
ALT: 17
AST: 27
CO2: 21
Chloride: 106
GFR calc Af Amer: >60
GFR calc non Af Amer: >60
Glucose, Bld: 111 — ABNORMAL HIGH
Sodium: 140
Total Bilirubin: 0.7

## 2011-07-11 LAB — POCT I-STAT, CHEM 8
BUN: 3 — ABNORMAL LOW
Calcium, Ion: 1.16
Chloride: 108
Creatinine, Ser: 0.6
Sodium: 141
TCO2: 25

## 2011-07-11 LAB — LIPASE, BLOOD: Lipase: 29

## 2011-07-17 LAB — CBC
HCT: 32.8 — ABNORMAL LOW
HCT: 33.3 — ABNORMAL LOW
HCT: 34.9 — ABNORMAL LOW
HCT: 30.8 — ABNORMAL LOW
HCT: 31.3 — ABNORMAL LOW
HCT: 31.7 — ABNORMAL LOW
Hemoglobin: 10.1 — ABNORMAL LOW
Hemoglobin: 11.2 — ABNORMAL LOW
Hemoglobin: 10.7 — ABNORMAL LOW
MCHC: 33.7
MCHC: 33.4
MCHC: 33.4
MCHC: 33.7
MCHC: 34.8
MCV: 88.3
MCV: 88.6
MCV: 89.1
MCV: 87.8
MCV: 90.1
MCV: 91.5
Platelets: 188
Platelets: 256
Platelets: 331
Platelets: 136 — ABNORMAL LOW
Platelets: 234
RBC: 3.34 — ABNORMAL LOW
RBC: 3.44 — ABNORMAL LOW
RBC: 3.72 — ABNORMAL LOW
RBC: 3.95
RBC: 4.09
RBC: 3.48 — ABNORMAL LOW
RDW: 15
RDW: 15.4
RDW: 14.7
RDW: 15
WBC: 10.6 — ABNORMAL HIGH
WBC: 13.6 — ABNORMAL HIGH
WBC: 7
WBC: 7.3
WBC: 7.3
WBC: 8.9
WBC: 25.2 — ABNORMAL HIGH

## 2011-07-17 LAB — FERRITIN: Ferritin: 263 (ref 10–291)

## 2011-07-17 LAB — BLOOD GAS, ARTERIAL
Acid-Base Excess: 4 — ABNORMAL HIGH
Acid-base deficit: 5.2 — ABNORMAL HIGH
Acid-base deficit: 5.3 — ABNORMAL HIGH
Acid-base deficit: 5.8 — ABNORMAL HIGH
Acid-base deficit: 6.4 — ABNORMAL HIGH
Bicarbonate: 25.5 — ABNORMAL HIGH
Bicarbonate: 19.6 — ABNORMAL LOW
Bicarbonate: 19.7 — ABNORMAL LOW
Bicarbonate: 19.9 — ABNORMAL LOW
Delivery systems: POSITIVE
Delivery systems: POSITIVE
Drawn by: 24549
Drawn by: 280971
FIO2: 0.35
FIO2: 0.3
FIO2: 0.5
O2 Content: 5
O2 Saturation: 90.6
O2 Saturation: 90.9
O2 Saturation: 95.3
Patient temperature: 98.6
Patient temperature: 97.6
Patient temperature: 98.6
Patient temperature: 98.6
Pressure support: 10
Pressure support: 10
TCO2: 26.7
TCO2: 29.5
TCO2: 20.9
TCO2: 21.3
TCO2: 22.3
pCO2 arterial: 37.4
pCO2 arterial: 37.8
pCO2 arterial: 38.1
pCO2 arterial: 43.2
pCO2 arterial: 44.7
pH, Arterial: 7.449 — ABNORMAL HIGH
pH, Arterial: 7.457 — ABNORMAL HIGH
pH, Arterial: 7.274 — ABNORMAL LOW
pH, Arterial: 7.286 — ABNORMAL LOW
pH, Arterial: 7.312 — ABNORMAL LOW
pH, Arterial: 7.333 — ABNORMAL LOW
pO2, Arterial: 58 — ABNORMAL LOW
pO2, Arterial: 116 — ABNORMAL HIGH
pO2, Arterial: 48.4 — ABNORMAL LOW
pO2, Arterial: 59.8 — ABNORMAL LOW
pO2, Arterial: 79.3 — ABNORMAL LOW

## 2011-07-17 LAB — BASIC METABOLIC PANEL
BUN: 2 — ABNORMAL LOW
BUN: 2 — ABNORMAL LOW
BUN: <1 — ABNORMAL LOW
BUN: 20
BUN: 4 — ABNORMAL LOW
BUN: 6
CO2: 27
CO2: 23
Calcium: 7.8 — ABNORMAL LOW
Calcium: 8.1 — ABNORMAL LOW
Calcium: 8.3 — ABNORMAL LOW
Calcium: 8 — ABNORMAL LOW
Calcium: 8.3 — ABNORMAL LOW
Chloride: 101
Chloride: 103
Chloride: 97
Chloride: 98
Chloride: 113 — ABNORMAL HIGH
Creatinine, Ser: 0.64
Creatinine, Ser: 0.67
Creatinine, Ser: 0.83
Creatinine, Ser: 0.71
Creatinine, Ser: 0.72
Creatinine, Ser: 1
GFR calc Af Amer: >60
GFR calc Af Amer: >60
GFR calc Af Amer: >60
GFR calc Af Amer: >60
GFR calc Af Amer: >60
GFR calc Af Amer: >60
GFR calc Af Amer: >60
GFR calc non Af Amer: >60
GFR calc non Af Amer: >60
GFR calc non Af Amer: >60
GFR calc non Af Amer: >60
GFR calc non Af Amer: >60
GFR calc non Af Amer: 59 — ABNORMAL LOW
GFR calc non Af Amer: >60
GFR calc non Af Amer: >60
Glucose, Bld: 116 — ABNORMAL HIGH
Glucose, Bld: 82
Glucose, Bld: 84
Glucose, Bld: 64 — ABNORMAL LOW
Glucose, Bld: 74
Potassium: 3.9
Potassium: 4.7
Potassium: 4.8
Potassium: 3.8
Sodium: 132 — ABNORMAL LOW
Sodium: 138
Sodium: 139
Sodium: 137

## 2011-07-17 LAB — SODIUM, URINE, RANDOM: Sodium, Ur: <10

## 2011-07-17 LAB — I-STAT 8, (EC8 V) (CONVERTED LAB)
Acid-base deficit: 1
Bicarbonate: 22.8
HCT: 45
Operator id: 272551
TCO2: 24
pCO2, Ven: 33.4 — ABNORMAL LOW

## 2011-07-17 LAB — COMPREHENSIVE METABOLIC PANEL
AST: 27
Albumin: 3 — ABNORMAL LOW
Alkaline Phosphatase: 100
Chloride: 98
Creatinine, Ser: 1.59 — ABNORMAL HIGH
GFR calc Af Amer: 42 — ABNORMAL LOW
Potassium: 3 — ABNORMAL LOW
Total Bilirubin: 0.9
Total Protein: 6.7

## 2011-07-17 LAB — COCAINE, URINE, CONFIRMATION: Benzoylecgonine GC/MS Conf: 1000 ng/mL

## 2011-07-17 LAB — URINE CULTURE
Culture: NO GROWTH
Special Requests: NEGATIVE

## 2011-07-17 LAB — URINALYSIS, DIPSTICK ONLY
Ketones, ur: NEGATIVE
Nitrite: NEGATIVE
Protein, ur: NEGATIVE
pH: 5.5

## 2011-07-17 LAB — DIFFERENTIAL
Basophils Absolute: 0
Eosinophils Absolute: 0 — ABNORMAL LOW
Eosinophils Relative: 0
Monocytes Absolute: 0.3
Neutrophils Relative %: 92 — ABNORMAL HIGH
WBC Morphology: INCREASED

## 2011-07-17 LAB — MAGNESIUM
Magnesium: 1.7
Magnesium: 1.8
Magnesium: 1.8
Magnesium: 1.4 — ABNORMAL LOW
Magnesium: 1.7

## 2011-07-17 LAB — LEGIONELLA ANTIGEN, URINE

## 2011-07-17 LAB — CULTURE, RESPIRATORY W GRAM STAIN: Culture: NORMAL

## 2011-07-17 LAB — CARDIAC PANEL(CRET KIN+CKTOT+MB+TROPI)
CK, MB: 10 — ABNORMAL HIGH
CK, MB: 13.1 — ABNORMAL HIGH
Relative Index: 4.3 — ABNORMAL HIGH
Relative Index: INVALID
Troponin I: 0.02
Troponin I: 0.03

## 2011-07-17 LAB — DRUGS OF ABUSE SCREEN W/O ALC, ROUTINE URINE
Cocaine Metabolites: POSITIVE — AB
Creatinine,U: 37.3
Methadone: POSITIVE — AB
Opiate Screen, Urine: NEGATIVE
Propoxyphene: NEGATIVE

## 2011-07-17 LAB — TSH: TSH: 0.823

## 2011-07-17 LAB — VANCOMYCIN, TROUGH: Vancomycin Tr: 14

## 2011-07-17 LAB — IRON AND TIBC
Saturation Ratios: 8 — ABNORMAL LOW
TIBC: 144 — ABNORMAL LOW
UIBC: 132

## 2011-07-17 LAB — FOLATE: Folate: 8.3

## 2011-07-17 LAB — INFLUENZA A+B VIRUS AG-DIRECT(RAPID)

## 2011-07-17 LAB — CREATININE, URINE, RANDOM: Creatinine, Urine: 34.6

## 2011-07-17 LAB — CULTURE, BLOOD (ROUTINE X 2)

## 2011-07-17 LAB — FOLATE RBC: RBC Folate: 583

## 2011-07-17 LAB — RETICULOCYTES: Retic Count, Absolute: 39.1

## 2011-07-19 LAB — I-STAT 8, (EC8 V) (CONVERTED LAB)
Chloride: 109
Glucose, Bld: 94
Potassium: 4.1
pCO2, Ven: 35.2 — ABNORMAL LOW
pH, Ven: 7.456 — ABNORMAL HIGH

## 2011-07-19 LAB — RAPID URINE DRUG SCREEN, HOSP PERFORMED
Amphetamines: NOT DETECTED
Barbiturates: NOT DETECTED
Opiates: POSITIVE — AB
Tetrahydrocannabinol: POSITIVE — AB

## 2011-07-19 LAB — URINALYSIS, ROUTINE W REFLEX MICROSCOPIC
Bilirubin Urine: NEGATIVE
Hgb urine dipstick: NEGATIVE
Nitrite: NEGATIVE
Specific Gravity, Urine: 1.005
Urobilinogen, UA: 0.2
pH: 7.5

## 2011-07-19 LAB — POCT CARDIAC MARKERS
CKMB, poc: 1.7
Myoglobin, poc: 70.7
Operator id: 272551
Operator id: 272551
Troponin i, poc: <0.05

## 2011-07-19 LAB — PROTIME-INR: Prothrombin Time: 13.4

## 2011-07-19 LAB — D-DIMER, QUANTITATIVE: D-Dimer, Quant: 1.46 — ABNORMAL HIGH

## 2011-07-19 LAB — ETHANOL: Alcohol, Ethyl (B): <5

## 2011-07-23 LAB — URINALYSIS, ROUTINE W REFLEX MICROSCOPIC
Glucose, UA: NEGATIVE
Protein, ur: NEGATIVE
Specific Gravity, Urine: 1.017
Urobilinogen, UA: 0.2

## 2011-07-23 LAB — DIFFERENTIAL
Basophils Relative: 1
Lymphocytes Relative: 13
Monocytes Relative: 3
Neutro Abs: 9.8 — ABNORMAL HIGH
Neutrophils Relative %: 83 — ABNORMAL HIGH

## 2011-07-23 LAB — COMPREHENSIVE METABOLIC PANEL
Alkaline Phosphatase: 129 — ABNORMAL HIGH
BUN: 13
Calcium: 8.9
Creatinine, Ser: 0.7
Glucose, Bld: 115 — ABNORMAL HIGH
Potassium: 4
Total Protein: 6.9

## 2011-07-23 LAB — CBC
HCT: 43.8
Hemoglobin: 14.6
MCHC: 33.4
MCV: 92.5
Platelets: 280
RDW: 14.2 — ABNORMAL HIGH

## 2011-07-31 ENCOUNTER — Other Ambulatory Visit: Payer: Self-pay | Admitting: Gastroenterology

## 2011-07-31 ENCOUNTER — Ambulatory Visit (HOSPITAL_COMMUNITY)
Admission: RE | Admit: 2011-07-31 | Discharge: 2011-07-31 | Disposition: A | Discharge status: Home / Self Care | Payer: Medicare Other | Source: Ambulatory Visit | Attending: Gastroenterology | Admitting: Gastroenterology

## 2011-07-31 DIAGNOSIS — F17200 Nicotine dependence, unspecified, uncomplicated: Secondary | ICD-10-CM | POA: Insufficient documentation

## 2011-07-31 DIAGNOSIS — R109 Unspecified abdominal pain: Secondary | ICD-10-CM | POA: Insufficient documentation

## 2011-07-31 DIAGNOSIS — J449 Chronic obstructive pulmonary disease, unspecified: Secondary | ICD-10-CM | POA: Insufficient documentation

## 2011-07-31 DIAGNOSIS — K648 Other hemorrhoids: Secondary | ICD-10-CM | POA: Insufficient documentation

## 2011-07-31 DIAGNOSIS — D126 Benign neoplasm of colon, unspecified: Secondary | ICD-10-CM | POA: Insufficient documentation

## 2011-07-31 DIAGNOSIS — R195 Other fecal abnormalities: Secondary | ICD-10-CM | POA: Insufficient documentation

## 2011-07-31 DIAGNOSIS — J4489 Other specified chronic obstructive pulmonary disease: Secondary | ICD-10-CM | POA: Insufficient documentation

## 2011-10-29 ENCOUNTER — Other Ambulatory Visit: Payer: Self-pay

## 2011-10-29 ENCOUNTER — Encounter (HOSPITAL_COMMUNITY): Payer: Self-pay

## 2011-10-29 ENCOUNTER — Emergency Department (HOSPITAL_COMMUNITY): Payer: Medicare Other

## 2011-10-29 ENCOUNTER — Emergency Department (HOSPITAL_COMMUNITY)
Admission: EM | Admit: 2011-10-29 | Discharge: 2011-10-29 | Disposition: A | Discharge status: Home / Self Care | Payer: Medicare Other | Attending: Emergency Medicine | Admitting: Emergency Medicine

## 2011-10-29 DIAGNOSIS — R059 Cough, unspecified: Secondary | ICD-10-CM | POA: Insufficient documentation

## 2011-10-29 DIAGNOSIS — R5381 Other malaise: Secondary | ICD-10-CM | POA: Insufficient documentation

## 2011-10-29 DIAGNOSIS — J438 Other emphysema: Secondary | ICD-10-CM | POA: Insufficient documentation

## 2011-10-29 DIAGNOSIS — R062 Wheezing: Secondary | ICD-10-CM | POA: Insufficient documentation

## 2011-10-29 DIAGNOSIS — R079 Chest pain, unspecified: Secondary | ICD-10-CM | POA: Insufficient documentation

## 2011-10-29 DIAGNOSIS — R0602 Shortness of breath: Secondary | ICD-10-CM | POA: Insufficient documentation

## 2011-10-29 DIAGNOSIS — R509 Fever, unspecified: Secondary | ICD-10-CM | POA: Insufficient documentation

## 2011-10-29 DIAGNOSIS — J3489 Other specified disorders of nose and nasal sinuses: Secondary | ICD-10-CM | POA: Insufficient documentation

## 2011-10-29 DIAGNOSIS — R05 Cough: Secondary | ICD-10-CM | POA: Insufficient documentation

## 2011-10-29 HISTORY — DX: Spinal stenosis, site unspecified: M48.00

## 2011-10-29 HISTORY — DX: Reserved for concepts with insufficient information to code with codable children: IMO0002

## 2011-10-29 MED ORDER — OXYCODONE HCL 5 MG PO TABS
15.0000 mg | ORAL_TABLET | Freq: Once | ORAL | Status: AC
  Administered 2011-10-29: 15 mg via ORAL
  Filled 2011-10-29: qty 3

## 2011-10-29 MED ORDER — OXYCODONE-ACETAMINOPHEN 5-325 MG PO TABS: 2.0000 | ORAL_TABLET | Freq: Once | ORAL | Status: DC

## 2011-10-29 MED ORDER — PREDNISONE 20 MG PO TABS
60.0000 mg | ORAL_TABLET | Freq: Once | ORAL | Status: AC
  Administered 2011-10-29: 60 mg via ORAL
  Filled 2011-10-29: qty 3

## 2011-10-29 MED ORDER — PREDNISONE 20 MG PO TABS: 40.0000 mg | ORAL_TABLET | Freq: Every day | ORAL | Status: AC

## 2011-10-29 MED ORDER — ALBUTEROL SULFATE (5 MG/ML) 0.5% IN NEBU
5.0000 mg | INHALATION_SOLUTION | Freq: Once | RESPIRATORY_TRACT | Status: AC
  Administered 2011-10-29: 5 mg via RESPIRATORY_TRACT
  Filled 2011-10-29: qty 1

## 2011-10-29 MED ORDER — IPRATROPIUM BROMIDE 0.02 % IN SOLN
0.5000 mg | Freq: Once | RESPIRATORY_TRACT | Status: AC
  Administered 2011-10-29: 0.5 mg via RESPIRATORY_TRACT
  Filled 2011-10-29: qty 2.5

## 2011-10-29 NOTE — ED Notes (Signed)
Patient left department prior to d/c paperwork given.  Patient unable to be located in department.

## 2011-10-29 NOTE — ED Notes (Signed)
Pt sts she thinks its in her lungs, complains of pain in lungs and pna, sts she has runny nose and feels weak all over.

## 2011-10-29 NOTE — ED Provider Notes (Signed)
History     CSN: [id number]  Arrival date & time 10/29/11  1002   First MD Initiated Contact with Patient 10/29/11 1012      Chief Complaint  Patient presents with  . Cough  . Chest Pain  . Weakness     The history is provided by the patient.  55 y/o F with a hx of COPD and chronic pain presents with 1.5 weeks unchanged cough productive of white sputum assoc with intermittent wheezing and SOB, rhinorrhea, intermittent subjective fever, and some generalized feeling of weakness. In the last few days, has also started to have non-radiating, severe, aching pain to bilateral lower chest wall, worse with coughing and palpation. Denies myalgias. Has been using albuterol occasionally without significant improvement. Has taken at-home narcotic pain rx with some relief.No flu shot this year.   Past Medical History  Diagnosis Date  . COPD (chronic obstructive pulmonary disease)   . Degenerative disc disease   . Spinal stenosis   . DDD (degenerative disc disease)   . DDD (degenerative disc disease)     History reviewed. No pertinent past surgical history.  History reviewed. No pertinent family history.  History  Substance Use Topics  . Smoking status: Current Everyday Smoker -- 2.0 packs/day  . Smokeless tobacco: Not on file  . Alcohol Use: Not on file     Review of Systems 10 systems reviewed and are negative for acute change except as noted in the HPI.  Allergies  Review of patient's allergies indicates no known allergies.  Home Medications   Current Outpatient Rx  Name Route Sig Dispense Refill  . ALBUTEROL SULFATE HFA 108 (90 BASE) MCG/ACT IN AERS Inhalation Inhale 2 puffs into the lungs as needed.      . DULOXETINE HCL 60 MG PO CPEP Oral Take 60 mg by mouth daily.     . IBUPROFEN 200 MG PO TABS Oral Take 600 mg by mouth every 6 (six) hours as needed. For pain    . OXYCODONE HCL 15 MG PO TABS Oral Take 15 mg by mouth every 4 (four) hours as needed. For pain      BP  148/92  Pulse 69  Temp(Src) 97.7 F (36.5 C) (Oral)  Resp 16  SpO2 100%  Physical Exam  Nursing note and vitals reviewed. Constitutional: She is oriented to person, place, and time. She appears well-developed and well-nourished. No distress.       Anxious-appearing  HENT:  Head: Normocephalic and atraumatic.  Right Ear: External ear normal.  Left Ear: External ear normal.  Mouth/Throat: Oropharynx is clear and moist.       Bilateral TM without erythema  Eyes: Conjunctivae are normal. Pupils are equal, round, and reactive to light.  Neck: Normal range of motion. Neck supple.  Cardiovascular: Normal rate, regular rhythm, normal heart sounds and intact distal pulses.   Pulmonary/Chest: Effort normal. No accessory muscle usage. No respiratory distress. She has decreased breath sounds. She has rhonchi. She exhibits tenderness.    Abdominal: Soft. Bowel sounds are normal. She exhibits no distension. There is no tenderness.  Musculoskeletal: She exhibits no edema and no tenderness.  Lymphadenopathy:    She has no cervical adenopathy.  Neurological: She is alert and oriented to person, place, and time. No cranial nerve deficit. Coordination normal.  Skin: Skin is warm and dry. No rash noted. No pallor.    ED Course  Procedures (including critical care time)  Labs Reviewed - No data to display Dg Chest  2 View  10/29/2011  *RADIOLOGY REPORT*  Clinical Data: Shortness of breath and cough  CHEST - 2 VIEW  Comparison: 08/26/2010  Findings: Hyperexpansion is consistent with emphysema.  There is bullous change in both upper lungs, right substantially more than left.  Imaging appearance is stable in the interval.  Nipple shadows project over each lung base.  No edema, focal consolidation, or pleural effusion. The cardiopericardial silhouette is within normal limits for size.  IMPRESSION: Advanced emphysema with right greater than left bullous change in the upper lobes.  Original Report  Authenticated By: ERIC A. MANSELL, M.D.     Dx 1: Emphysema   MDM  COPD with cough x 1.5 weeks. Suspect viral illness vs bronchitis worsening emphysema. CXR reviewed, no evidence of pneumonia. Do not suspect ACS in this patient given symptom duration and progression, ECG unchanged. No recent prolonged trip, calves supple, no tachypnea, tachycardia, or hypoxia, do not suspect PE. Pt reports some symptom improvement after neb tx. Advised continued use of at-home albuterol, short prednisone course, and f/u with PCP in 48 hours for recheck. Pt voiced understanding.        Elwyn Reach [place], [place] 10/29/11 1140

## 2011-10-29 NOTE — ED Provider Notes (Signed)
10:16 AM  Date: 10/29/2011  Rate: 67  Rhythm: normal sinus rhythm  QRS Axis: right  Intervals: normal PQRS:  Right atrial hypertrophy  ST/T Wave abnormalities: normal  Conduction Disutrbances:  Incomplete right bundle branch block  Narrative Interpretation:   Abnormal EKD  Old EKG Reviewed: unchanged    Carleene Cooper III, MD 10/29/11 1018

## 2011-10-30 NOTE — ED Provider Notes (Signed)
Medical screening examination/treatment/procedure(s) were performed by non-physician practitioner and as supervising physician I was immediately available for consultation/collaboration.  Raeford Razor, MD 10/30/11 1212

## 2011-12-04 ENCOUNTER — Encounter: Payer: Medicare Other | Admitting: Internal Medicine

## 2011-12-06 ENCOUNTER — Encounter: Payer: Medicare Other | Admitting: Internal Medicine

## 2012-01-18 ENCOUNTER — Emergency Department (HOSPITAL_COMMUNITY): Payer: Medicare Other

## 2012-01-18 ENCOUNTER — Emergency Department (HOSPITAL_COMMUNITY)
Admission: EM | Admit: 2012-01-18 | Discharge: 2012-01-18 | Disposition: A | Discharge status: Home / Self Care | Payer: Medicare Other | Attending: Emergency Medicine | Admitting: Emergency Medicine

## 2012-01-18 ENCOUNTER — Encounter (HOSPITAL_COMMUNITY): Payer: Self-pay | Admitting: Emergency Medicine

## 2012-01-18 DIAGNOSIS — R1013 Epigastric pain: Secondary | ICD-10-CM | POA: Insufficient documentation

## 2012-01-18 DIAGNOSIS — J4489 Other specified chronic obstructive pulmonary disease: Secondary | ICD-10-CM | POA: Insufficient documentation

## 2012-01-18 DIAGNOSIS — R197 Diarrhea, unspecified: Secondary | ICD-10-CM | POA: Insufficient documentation

## 2012-01-18 DIAGNOSIS — E876 Hypokalemia: Secondary | ICD-10-CM | POA: Insufficient documentation

## 2012-01-18 DIAGNOSIS — J449 Chronic obstructive pulmonary disease, unspecified: Secondary | ICD-10-CM | POA: Insufficient documentation

## 2012-01-18 DIAGNOSIS — R112 Nausea with vomiting, unspecified: Secondary | ICD-10-CM

## 2012-01-18 LAB — DIFFERENTIAL
Basophils Absolute: 0 10*3/uL (ref 0.0–0.1)
Lymphocytes Relative: 4 % — ABNORMAL LOW (ref 12–46)
Monocytes Absolute: 0 10*3/uL — ABNORMAL LOW (ref 0.1–1.0)
Monocytes Relative: 0 % — ABNORMAL LOW (ref 3–12)
Neutro Abs: 12.3 10*3/uL — ABNORMAL HIGH (ref 1.7–7.7)

## 2012-01-18 LAB — COMPREHENSIVE METABOLIC PANEL
AST: 27 U/L (ref 0–37)
Alkaline Phosphatase: 152 U/L — ABNORMAL HIGH (ref 39–117)
CO2: 22 mEq/L (ref 19–32)
Chloride: 104 mEq/L (ref 96–112)
Creatinine, Ser: 0.65 mg/dL (ref 0.50–1.10)
GFR calc non Af Amer: >90 mL/min (ref >90)
Potassium: 2.6 mEq/L — CL (ref 3.5–5.1)
Total Bilirubin: 0.6 mg/dL (ref 0.3–1.2)

## 2012-01-18 LAB — CBC
HCT: 43.2 % (ref 36.0–46.0)
Hemoglobin: 15.1 g/dL — ABNORMAL HIGH (ref 12.0–15.0)
RDW: 13.2 % (ref 11.5–15.5)
WBC: 12.9 10*3/uL — ABNORMAL HIGH (ref 4.0–10.5)

## 2012-01-18 LAB — POCT I-STAT, CHEM 8
BUN: <3 mg/dL — ABNORMAL LOW (ref 6–23)
Calcium, Ion: 1.07 mmol/L — ABNORMAL LOW (ref 1.12–1.32)
Creatinine, Ser: 0.7 mg/dL (ref 0.50–1.10)
Glucose, Bld: 129 mg/dL — ABNORMAL HIGH (ref 70–99)
TCO2: 23 mmol/L (ref 0–100)

## 2012-01-18 LAB — GLUCOSE, CAPILLARY: Glucose-Capillary: 100 mg/dL — ABNORMAL HIGH (ref 70–99)

## 2012-01-18 MED ORDER — LORAZEPAM 2 MG/ML IJ SOLN
1.0000 mg | Freq: Once | INTRAMUSCULAR | Status: AC
  Administered 2012-01-18: 1 mg via INTRAVENOUS
  Filled 2012-01-18: qty 1

## 2012-01-18 MED ORDER — FENTANYL CITRATE 0.05 MG/ML IJ SOLN
100.0000 ug | Freq: Once | INTRAMUSCULAR | Status: AC
  Administered 2012-01-18: 100 ug via INTRAVENOUS
  Filled 2012-01-18: qty 2

## 2012-01-18 MED ORDER — POTASSIUM CHLORIDE CRYS ER 20 MEQ PO TBCR
40.0000 meq | EXTENDED_RELEASE_TABLET | Freq: Once | ORAL | Status: AC
  Administered 2012-01-18: 40 meq via ORAL
  Filled 2012-01-18 (×2): qty 1

## 2012-01-18 MED ORDER — SODIUM CHLORIDE 0.9 % IV BOLUS (SEPSIS)
500.0000 mL | Freq: Once | INTRAVENOUS | Status: AC
  Administered 2012-01-18: 500 mL via INTRAVENOUS

## 2012-01-18 MED ORDER — ONDANSETRON HCL 4 MG/2ML IJ SOLN
4.0000 mg | Freq: Once | INTRAMUSCULAR | Status: AC
  Administered 2012-01-18: 4 mg via INTRAVENOUS
  Filled 2012-01-18: qty 2

## 2012-01-18 MED ORDER — PROMETHAZINE HCL 25 MG RE SUPP: 25.0000 mg | Freq: Four times a day (QID) | RECTAL | Status: DC | PRN

## 2012-01-18 MED ORDER — POTASSIUM CHLORIDE ER 10 MEQ PO TBCR: 20.0000 meq | EXTENDED_RELEASE_TABLET | Freq: Two times a day (BID) | ORAL | Status: DC

## 2012-01-18 MED ORDER — ONDANSETRON HCL 4 MG PO TABS: 4.0000 mg | ORAL_TABLET | Freq: Four times a day (QID) | ORAL | Status: AC

## 2012-01-18 MED ORDER — POTASSIUM CHLORIDE 10 MEQ/100ML IV SOLN
10.0000 meq | Freq: Once | INTRAVENOUS | Status: AC
  Administered 2012-01-18: 10 meq via INTRAVENOUS
  Filled 2012-01-18: qty 100

## 2012-01-18 MED ORDER — METOCLOPRAMIDE HCL 5 MG/ML IJ SOLN
10.0000 mg | Freq: Once | INTRAMUSCULAR | Status: AC
  Administered 2012-01-18: 10 mg via INTRAVENOUS
  Filled 2012-01-18: qty 2

## 2012-01-18 NOTE — Discharge Instructions (Signed)
Use Zofran as needed for nausea and vomiting and Phenergan suppository if needed for ongoing vomiting. Take potassium as directed and followup with Outpatient Clinics in the next week for recheck of your potassium level. Followup with Dr. Bosie Clos in 1-2 weeks for recheck of recurrent abdominal pain, nausea, vomiting, diarrhea however return to emergency department for changing or worsening of symptoms.  Nausea and Vomiting Nausea means you feel sick to your stomach. Throwing up (vomiting) is a reflex where stomach contents come out of your mouth. HOME CARE   Take medicine as told by your doctor.   Do not force yourself to eat. However, you do need to drink fluids.   If you feel like eating, eat a normal diet as told by your doctor.   Eat rice, wheat, potatoes, bread, lean meats, yogurt, fruits, and vegetables.   Avoid high-fat foods.   Drink enough fluids to keep your pee (urine) clear or pale yellow.   Ask your doctor how to replace body fluid losses (rehydrate). Signs of body fluid loss (dehydration) include:   Feeling very thirsty.   Dry lips and mouth.   Feeling dizzy.   Dark pee.   Peeing less than normal.   Feeling confused.   Fast breathing or heart rate.  GET HELP RIGHT AWAY IF:   You have blood in your throw up.   You have black or bloody poop (stool).   You have a bad headache or stiff neck.   You feel confused.   You have bad belly (abdominal) pain.   You have chest pain or trouble breathing.   You do not pee at least once every 8 hours.   You have cold, clammy skin.   You keep throwing up after 24 to 48 hours.   You have a fever.  MAKE SURE YOU:   Understand these instructions.   Will watch your condition.   Will get help right away if you are not doing well or get worse.  Document Released: 03/12/2008 Document Revised: 09/13/2011 Document Reviewed: 02/23/2011 [facility] Patient Information 2012 [place], [place].  Hypokalemia Hypokalemia  means a low potassium level in the blood.Potassium is an electrolyte that helps regulate the amount of fluid in the body. It also stimulates muscle contraction and maintains a stable acid-base balance.Most of the body's potassium is inside of cells, and only a very small amount is in the blood. Because the amount in the blood is so small, minor changes can have big effects. PREPARATION FOR TEST Testing for potassium requires taking a blood sample taken by needle from a vein in the arm. The skin is cleaned thoroughly before the sample is drawn. There is no other special preparation needed. NORMAL VALUES Potassium levels below 3.5 mEq/L are abnormally low. Levels above 5.1 mEq/L are abnormally high. Ranges for normal findings may vary among different laboratories and hospitals. You should always check with your doctor after having lab work or other tests done to discuss the meaning of your test results and whether your values are considered within normal limits. MEANING OF TEST  Your caregiver will go over the test results with you and discuss the importance and meaning of your results, as well as treatment options and the need for additional tests, if necessary. A potassium level is frequently part of a routine medical exam. It is usually included as part of a whole "panel" of tests for several blood salts (such as Sodium and Chloride). It may be done as part of follow-up when a  low potassium level was found in the past or other blood salts are suspected of being out of balance. A low potassium level might be suspected if you have one or more of the following:  Symptoms of weakness.   Abnormal heart rhythms.   High blood pressure and are taking medication to control this, especially water pills (diuretics).   Kidney disease that can affect your potassium level .   Diabetes requiring the use of insulin. The potassium may fall after taking insulin, especially if the diabetes had been out of  control for a while.   A condition requiring the use of cortisone-type medication or certain types of antibiotics.   Vomiting and/or diarrhea for more than a day or two.   A stomach or intestinal condition that may not permit appropriate absorption of potassium.   Fainting episodes.   Mental confusion.  OBTAINING TEST RESULTS It is your responsibility to obtain your test results. Ask the lab or department performing the test when and how you will get your results.  Please contact your caregiver directly if you have not received the results within one week. At that time, ask if there is anything different or new you should be doing in relation to the results. TREATMENT Hypokalemia can be treated with potassium supplements taken by mouth and/or adjustments in your current medications. A diet high in potassium is also helpful. Foods with high potassium content are:  Peas, lentils, lima beans, nuts, and dried fruit.   Whole grain and bran cereals and breads.   Fresh fruit, vegetables (bananas, cantaloupe, grapefruit, oranges, tomatoes, honeydew melons, potatoes).   Orange and tomato juices.   Meats. If potassium supplement has been prescribed for you today or your medications have been adjusted, see your personal caregiver in time02 for a re-check.  SEEK MEDICAL CARE IF:  There is a feeling of worsening weakness.   You experience repeated chest palpitations.   You are diabetic and having difficulty keeping your blood sugars in the normal range.   You are experiencing vomiting and/or diarrhea.   You are having difficulty with any of your regular medications.  SEEK IMMEDIATE MEDICAL CARE IF:  You experience chest pain, shortness of breath, or episodes of dizziness.   You have been having vomiting or diarrhea for more than 2 days.   You have a fainting episode.  MAKE SURE YOU:   Understand these instructions.   Will watch your condition.   Will get help right away if you  are not doing well or get worse.  Document Released: 09/24/2005 Document Revised: 09/13/2011 Document Reviewed: 09/04/2008 [facility] Patient Information 2012 [place], [place].  B.R.A.T. Diet Your doctor has recommended the B.R.A.T. diet for you or your child until the condition improves. This is often used to help control diarrhea and vomiting symptoms. If you or your child can tolerate clear liquids, you may have:  Bananas.   Rice.   Applesauce.   Toast (and other simple starches such as crackers, potatoes, noodles).  Be sure to avoid dairy products, meats, and fatty foods until symptoms are better. Fruit juices such as apple, grape, and prune juice can make diarrhea worse. Avoid these. Continue this diet for 2 days or as instructed by your caregiver. Document Released: 09/24/2005 Document Revised: 09/13/2011 Document Reviewed: 03/13/2007 [facility] Patient Information 2012 [place], [place].

## 2012-01-18 NOTE — ED Notes (Addendum)
PA Hunt notified of potassium level, also notified that patient is requesting further pain and nausea medication

## 2012-01-18 NOTE — ED Provider Notes (Signed)
History     CSN: [id number]  Arrival date & time 01/18/12  1129   First MD Initiated Contact with Patient 01/18/12 1144      Chief Complaint  Patient presents with  . Nausea    (Consider location/radiation/quality/duration/timing/severity/associated sxs/prior treatment) HPI  Patient who has history of chronic pain who is seen by pain management who also has history of recurrent abdominal pain with associated nausea, vomiting, and diarrhea presents to emergency department complaining of gradual onset of abdominal pain, nausea, vomiting, and diarrhea that began last night and has persisted throughout the night and into today. Patient presents to ER by EMS. Patient states symptoms are similar to her symptoms are recurrent abdominal pain with associated nausea, vomiting, and diarrhea however she states that she has been contact with recent sick contacts with similar symptoms. Patient states she is similar symptoms approximately once every 6 months. Patient has seen Dr. Bosie Clos in the past for aforementioned symptoms. Patient states she is followed by the outpatient clinic. Patient has history of hysterectomy but no other abdominal surgeries. Patient states she took ibuprofen prior to arrival without relief of symptoms. She's taken nothing else for symptoms. She denies aggravating or alleviating factors. Denies known fevers or chills, CP, SOB, dysuria, hematuria, blood in stool, pelvic pain or vaginal d/c. Symptoms are gradual onset, persistent, and unchanging.  Past Medical History  Diagnosis Date  . COPD (chronic obstructive pulmonary disease)   . Degenerative disc disease   . Spinal stenosis   . DDD (degenerative disc disease)   . DDD (degenerative disc disease)     History reviewed. No pertinent past surgical history.  History reviewed. No pertinent family history.  History  Substance Use Topics  . Smoking status: Current Everyday Smoker -- 2.0 packs/day  . Smokeless tobacco: Not  on file  . Alcohol Use: Not on file    OB History    Grav Para Term Preterm Abortions TAB SAB Ect Mult Living                  Review of Systems  All other systems reviewed and are negative.    Allergies  Review of patient's allergies indicates no known allergies.  Home Medications   Current Outpatient Rx  Name Route Sig Dispense Refill  . ALBUTEROL SULFATE HFA 108 (90 BASE) MCG/ACT IN AERS Inhalation Inhale 2 puffs into the lungs as needed.      . DULOXETINE HCL 60 MG PO CPEP Oral Take 60 mg by mouth daily.     . IBUPROFEN 200 MG PO TABS Oral Take 600 mg by mouth every 6 (six) hours as needed. For pain    . OXYCODONE HCL 15 MG PO TABS Oral Take 15 mg by mouth every 4 (four) hours as needed. For pain      BP 159/62  Pulse 80  Temp(Src) 98.1 F (36.7 C) (Oral)  Resp 20  SpO2 100%  Physical Exam  Nursing note and vitals reviewed. Constitutional: She is oriented to person, place, and time. She appears well-developed and well-nourished. No distress.  HENT:  Head: Normocephalic and atraumatic.  Eyes: Conjunctivae are normal.  Neck: Normal range of motion. Neck supple.  Cardiovascular: Normal rate, regular rhythm, normal heart sounds and intact distal pulses.  Exam reveals no gallop and no friction rub.   No murmur heard. Pulmonary/Chest: Effort normal and breath sounds normal. No respiratory distress. She has no wheezes. She has no rales. She exhibits no tenderness.  Abdominal: Soft. Bowel  sounds are normal. She exhibits no distension and no mass. There is tenderness. There is no rebound and no guarding.       Moderate tenderness to palpation of epigastric region but no peritoneal signs or rigidity. Abdomen is soft  Musculoskeletal: Normal range of motion. She exhibits no edema and no tenderness.  Neurological: She is alert and oriented to person, place, and time.  Skin: Skin is warm and dry. No rash noted. She is not diaphoretic. No erythema.  Psychiatric: She has a  normal mood and affect.    ED Course  Procedures (including critical care time)  IV fentanyl, ativan, fluids and zofran  4:54 PM Patient states symptoms are improving and she is ready to have PO challenge.  Patient has been tolerating by mouth fluids and was able to hold down her potassium supplement  Labs Reviewed  CBC - Abnormal; Notable for the following:    WBC 12.9 (*)    Hemoglobin 15.1 (*)    All other components within normal limits  DIFFERENTIAL - Abnormal; Notable for the following:    Neutrophils Relative 95 (*)    Neutro Abs 12.3 (*)    Lymphocytes Relative 4 (*)    Lymphs Abs 0.5 (*)    Monocytes Relative 0 (*)    Monocytes Absolute 0.0 (*)    All other components within normal limits  COMPREHENSIVE METABOLIC PANEL - Abnormal; Notable for the following:    Potassium 2.6 (*)    Alkaline Phosphatase 152 (*)    All other components within normal limits  GLUCOSE, CAPILLARY - Abnormal; Notable for the following:    Glucose-Capillary 100 (*)    All other components within normal limits  POCT I-STAT, CHEM 8 - Abnormal; Notable for the following:    Potassium 2.9 (*)    BUN <3 (*)    Glucose, Bld 129 (*)    Calcium, Ion 1.07 (*)    Hemoglobin 16.7 (*)    HCT 49.0 (*)    All other components within normal limits  LIPASE, BLOOD   US Abdomen Complete  01/18/2012  *RADIOLOGY REPORT*  Clinical Data:  56 year old female with abdominal pain.  ABDOMINAL ULTRASOUND COMPLETE  Comparison:  06/22/2011 prior CTs  Findings:  Gallbladder:  The gallbladder is unremarkable. There is no evidence of gallstones, gallbladder wall thickening, or pericholecystic fluid.  Common Bile Duct:  There is no evidence of intrahepatic or extrahepatic biliary dilation. The CBD measures 4.2 mm in greatest diameter.  Liver:  The liver is within normal limits in parenchymal echogenicity. No focal abnormalities are identified.  IVC:  Appears normal.  Pancreas:  Although the pancreas is difficult to  visualize in its entirety, no focal pancreatic abnormality is identified.  Spleen:  Within normal limits in size and echotexture.  Right kidney:  The right kidney is normal in size and parenchymal echogenicity.  There is no evidence of solid mass, hydronephrosis or definite renal calculi.  The right kidney measures 12.7 cm.  Left kidney:  The left kidney is normal in size and parenchymal echogenicity.  There is no evidence of solid mass, hydronephrosis or definite renal calculi.   The left kidney measures 11.1 cm. Renal cysts are identified.  Abdominal Aorta:  No abdominal aortic aneurysm identified.  Mild to moderate calcified atherosclerotic plaque is noted.  There is no evidence of ascites.  IMPRESSION: No evidence of acute abnormality.  Aortic atherosclerosis and left renal cysts.  Original Report Authenticated By: Rosendo Gros, M.D.  1. Nausea vomiting and diarrhea   2. Abdominal pain   3. Hypokalemia       MDM  Patient is afebrile and nontoxic-appearing. She has a nonacute abdomen. Patient has history of chronic abdominal pain, nausea, vomiting, and diarrhea that she states symptoms are similar to. Patient is tolerating by mouth fluids. Patient's potassium level is improving after repleted potassium that she is agreeable to at home supplementation and following up with her primary care physician in the next week for recheck of her potassium. No acute findings on ultrasound. I spoke at length with patient about changing or worsening symptoms that should prompt immediate return to emergency department but otherwise following up with her GI specialist and her chronic pain management for recurrent similar symptoms. Patient voices her understanding and is agreeable to plan.        [place]ness [place], [place] 01/18/12 1816

## 2012-01-18 NOTE — ED Notes (Signed)
Unable to locate patient x2.

## 2012-01-18 NOTE — ED Notes (Signed)
AVW:[id number]<[id number]> Expected date:01/18/12<BR> Expected time:<BR> Means of arrival:<BR> Comments:<BR> EMS 20 gC - flu symptoms

## 2012-01-18 NOTE — ED Notes (Signed)
Per EMS - Last night pt stated she began to have n/v/d. Pt vomited X 1 on the way to hospital.
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
## 2012-01-21 NOTE — ED Provider Notes (Signed)
Medical screening examination/treatment/procedure(s) were performed by non-physician practitioner and as supervising physician I was immediately available for consultation/collaboration.  Ripley Bogosian K Linker, MD 01/21/12 1543 

## 2012-06-03 ENCOUNTER — Emergency Department (HOSPITAL_COMMUNITY)
Admission: EM | Admit: 2012-06-03 | Discharge: 2012-06-03 | Disposition: A | Discharge status: Home / Self Care | Payer: Medicare Other | Attending: Emergency Medicine | Admitting: Emergency Medicine

## 2012-06-03 ENCOUNTER — Encounter (HOSPITAL_COMMUNITY): Payer: Self-pay | Admitting: *Deleted

## 2012-06-03 DIAGNOSIS — R112 Nausea with vomiting, unspecified: Secondary | ICD-10-CM | POA: Insufficient documentation

## 2012-06-03 DIAGNOSIS — R109 Unspecified abdominal pain: Secondary | ICD-10-CM | POA: Insufficient documentation

## 2012-06-03 DIAGNOSIS — R197 Diarrhea, unspecified: Secondary | ICD-10-CM | POA: Insufficient documentation

## 2012-06-03 HISTORY — DX: Essential (primary) hypertension: I10

## 2012-06-03 LAB — RAPID URINE DRUG SCREEN, HOSP PERFORMED
Amphetamines: POSITIVE — AB
Cocaine: NOT DETECTED
Opiates: POSITIVE — AB
Tetrahydrocannabinol: POSITIVE — AB

## 2012-06-03 LAB — COMPREHENSIVE METABOLIC PANEL
ALT: 17 U/L (ref 0–35)
CO2: 25 mEq/L (ref 19–32)
Calcium: 9.9 mg/dL (ref 8.4–10.5)
Creatinine, Ser: 0.66 mg/dL (ref 0.50–1.10)
GFR calc Af Amer: >90 mL/min (ref >90)
GFR calc non Af Amer: >90 mL/min (ref >90)
Glucose, Bld: 120 mg/dL — ABNORMAL HIGH (ref 70–99)
Sodium: 137 mEq/L (ref 135–145)
Total Protein: 7.8 g/dL (ref 6.0–8.3)

## 2012-06-03 LAB — URINALYSIS, ROUTINE W REFLEX MICROSCOPIC
Glucose, UA: NEGATIVE mg/dL
Hgb urine dipstick: NEGATIVE
Leukocytes, UA: NEGATIVE
Specific Gravity, Urine: 1.023 (ref 1.005–1.030)
pH: 6 (ref 5.0–8.0)

## 2012-06-03 LAB — CBC WITH DIFFERENTIAL/PLATELET
Basophils Relative: 1 % (ref 0–1)
Eosinophils Absolute: 0.1 10*3/uL (ref 0.0–0.7)
Eosinophils Relative: 1 % (ref 0–5)
Lymphs Abs: 1.9 10*3/uL (ref 0.7–4.0)
MCH: 30 pg (ref 26.0–34.0)
MCHC: 34.7 g/dL (ref 30.0–36.0)
MCV: 86.5 fL (ref 78.0–100.0)
Monocytes Relative: 7 % (ref 3–12)
Neutrophils Relative %: 72 % (ref 43–77)
Platelets: 255 10*3/uL (ref 150–400)

## 2012-06-03 LAB — URINE MICROSCOPIC-ADD ON

## 2012-06-03 LAB — ETHANOL: Alcohol, Ethyl (B): <11 mg/dL (ref 0–11)

## 2012-06-03 NOTE — ED Notes (Signed)
The pt is having abd pain since last night with nv and diarrhea.  lmp none
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
## 2012-07-09 ENCOUNTER — Emergency Department (HOSPITAL_COMMUNITY)
Admission: EM | Admit: 2012-07-09 | Discharge: 2012-07-09 | Disposition: A | Discharge status: Home / Self Care | Payer: Medicare Other | Attending: Emergency Medicine | Admitting: Emergency Medicine

## 2012-07-09 ENCOUNTER — Encounter (HOSPITAL_COMMUNITY): Payer: Self-pay | Admitting: *Deleted

## 2012-07-09 DIAGNOSIS — J4489 Other specified chronic obstructive pulmonary disease: Secondary | ICD-10-CM | POA: Insufficient documentation

## 2012-07-09 DIAGNOSIS — Z87891 Personal history of nicotine dependence: Secondary | ICD-10-CM | POA: Insufficient documentation

## 2012-07-09 DIAGNOSIS — J449 Chronic obstructive pulmonary disease, unspecified: Secondary | ICD-10-CM | POA: Insufficient documentation

## 2012-07-09 DIAGNOSIS — Z79899 Other long term (current) drug therapy: Secondary | ICD-10-CM | POA: Insufficient documentation

## 2012-07-09 DIAGNOSIS — R51 Headache: Secondary | ICD-10-CM | POA: Insufficient documentation

## 2012-07-09 MED ORDER — HYDROCODONE-ACETAMINOPHEN 5-325 MG PO TABS
2.0000 | ORAL_TABLET | Freq: Once | ORAL | Status: AC
  Administered 2012-07-09: 2 via ORAL
  Filled 2012-07-09: qty 2

## 2012-07-09 MED ORDER — METHYLPREDNISOLONE SODIUM SUCC 125 MG IJ SOLR
125.0000 mg | Freq: Once | INTRAMUSCULAR | Status: AC
  Administered 2012-07-09: 125 mg via INTRAMUSCULAR
  Filled 2012-07-09: qty 2

## 2012-07-09 MED ORDER — DIPHENHYDRAMINE HCL 50 MG/ML IJ SOLN
25.0000 mg | Freq: Once | INTRAMUSCULAR | Status: AC
  Administered 2012-07-09: 25 mg via INTRAMUSCULAR
  Filled 2012-07-09: qty 1

## 2012-07-09 MED ORDER — METOCLOPRAMIDE HCL 5 MG/ML IJ SOLN
10.0000 mg | Freq: Once | INTRAMUSCULAR | Status: AC
  Administered 2012-07-09: 10 mg via INTRAMUSCULAR
  Filled 2012-07-09: qty 2
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
## 2012-07-10 NOTE — ED Provider Notes (Signed)
Medical screening examination/treatment/procedure(s) were performed by non-physician practitioner and as supervising physician I was immediately available for consultation/collaboration.   Effie Wahlert L Suzane Vanderweide, MD 07/10/12 0906 

## 2012-11-28 ENCOUNTER — Emergency Department (HOSPITAL_COMMUNITY): Payer: Medicare Other

## 2012-11-28 ENCOUNTER — Encounter (HOSPITAL_COMMUNITY): Payer: Self-pay | Admitting: *Deleted

## 2012-11-28 ENCOUNTER — Encounter (HOSPITAL_COMMUNITY): Payer: Self-pay | Admitting: Certified Registered"

## 2012-11-28 ENCOUNTER — Emergency Department (HOSPITAL_COMMUNITY): Payer: Medicare Other | Admitting: Certified Registered"

## 2012-11-28 ENCOUNTER — Encounter (HOSPITAL_COMMUNITY)
Admission: EM | Disposition: A | Discharge status: Skilled Nursing Facility | Payer: Self-pay | Source: Home / Self Care | Attending: Internal Medicine

## 2012-11-28 ENCOUNTER — Inpatient Hospital Stay (HOSPITAL_COMMUNITY)
Admission: EM | Admit: 2012-11-28 | Discharge: 2012-12-01 | DRG: 481 | Disposition: A | Discharge status: Skilled Nursing Facility | Payer: Medicare Other | Attending: Internal Medicine | Admitting: Internal Medicine

## 2012-11-28 ENCOUNTER — Other Ambulatory Visit (HOSPITAL_COMMUNITY): Payer: Medicare Other

## 2012-11-28 DIAGNOSIS — I10 Essential (primary) hypertension: Secondary | ICD-10-CM | POA: Diagnosis present

## 2012-11-28 DIAGNOSIS — R030 Elevated blood-pressure reading, without diagnosis of hypertension: Secondary | ICD-10-CM

## 2012-11-28 DIAGNOSIS — R296 Repeated falls: Secondary | ICD-10-CM | POA: Diagnosis present

## 2012-11-28 DIAGNOSIS — J449 Chronic obstructive pulmonary disease, unspecified: Secondary | ICD-10-CM

## 2012-11-28 DIAGNOSIS — Z9981 Dependence on supplemental oxygen: Secondary | ICD-10-CM

## 2012-11-28 DIAGNOSIS — F17200 Nicotine dependence, unspecified, uncomplicated: Secondary | ICD-10-CM | POA: Diagnosis present

## 2012-11-28 DIAGNOSIS — Z79899 Other long term (current) drug therapy: Secondary | ICD-10-CM

## 2012-11-28 DIAGNOSIS — A403 Sepsis due to Streptococcus pneumoniae: Secondary | ICD-10-CM

## 2012-11-28 DIAGNOSIS — Z0000 Encounter for general adult medical examination without abnormal findings: Secondary | ICD-10-CM

## 2012-11-28 DIAGNOSIS — J9610 Chronic respiratory failure, unspecified whether with hypoxia or hypercapnia: Secondary | ICD-10-CM | POA: Diagnosis present

## 2012-11-28 DIAGNOSIS — J13 Pneumonia due to Streptococcus pneumoniae: Secondary | ICD-10-CM

## 2012-11-28 DIAGNOSIS — B1710 Acute hepatitis C without hepatic coma: Secondary | ICD-10-CM

## 2012-11-28 DIAGNOSIS — F1910 Other psychoactive substance abuse, uncomplicated: Secondary | ICD-10-CM

## 2012-11-28 DIAGNOSIS — E440 Moderate protein-calorie malnutrition: Secondary | ICD-10-CM | POA: Diagnosis present

## 2012-11-28 DIAGNOSIS — IMO0002 Reserved for concepts with insufficient information to code with codable children: Secondary | ICD-10-CM

## 2012-11-28 DIAGNOSIS — J4489 Other specified chronic obstructive pulmonary disease: Secondary | ICD-10-CM | POA: Diagnosis present

## 2012-11-28 DIAGNOSIS — S72143A Displaced intertrochanteric fracture of unspecified femur, initial encounter for closed fracture: Secondary | ICD-10-CM

## 2012-11-28 DIAGNOSIS — M542 Cervicalgia: Secondary | ICD-10-CM

## 2012-11-28 DIAGNOSIS — D62 Acute posthemorrhagic anemia: Secondary | ICD-10-CM | POA: Diagnosis not present

## 2012-11-28 DIAGNOSIS — Y93K1 Activity, walking an animal: Secondary | ICD-10-CM

## 2012-11-28 HISTORY — PX: FEMUR IM NAIL: SHX1597

## 2012-11-28 LAB — CBC
HCT: 39.4 % (ref 36.0–46.0)
Hemoglobin: 13.2 g/dL (ref 12.0–15.0)
MCH: 30.7 pg (ref 26.0–34.0)
MCHC: 33.5 g/dL (ref 30.0–36.0)
MCV: 91.6 fL (ref 78.0–100.0)
Platelets: 270 10*3/uL (ref 150–400)
RBC: 4.3 MIL/uL (ref 3.87–5.11)
RDW: 13.7 % (ref 11.5–15.5)
WBC: 13 10*3/uL — ABNORMAL HIGH (ref 4.0–10.5)

## 2012-11-28 LAB — BASIC METABOLIC PANEL
BUN: 5 mg/dL — ABNORMAL LOW (ref 6–23)
CO2: 22 mEq/L (ref 19–32)
Calcium: 9.3 mg/dL (ref 8.4–10.5)
Chloride: 102 mEq/L (ref 96–112)
Creatinine, Ser: 0.68 mg/dL (ref 0.50–1.10)
GFR calc Af Amer: >90 mL/min (ref >90)
GFR calc non Af Amer: >90 mL/min (ref >90)
Glucose, Bld: 92 mg/dL (ref 70–99)
Potassium: 3.6 mEq/L (ref 3.5–5.1)
Sodium: 136 mEq/L (ref 135–145)

## 2012-11-28 SURGERY — INSERTION, INTRAMEDULLARY ROD, FEMUR, RETROGRADE
Anesthesia: General | Site: Hip | Laterality: Right | Wound class: Clean

## 2012-11-28 MED ORDER — ACETAMINOPHEN 10 MG/ML IV SOLN
INTRAVENOUS | Status: AC
  Filled 2012-11-28: qty 100

## 2012-11-28 MED ORDER — METHOCARBAMOL 100 MG/ML IJ SOLN
500.0000 mg | Freq: Four times a day (QID) | INTRAVENOUS | Status: DC | PRN
  Filled 2012-11-28: qty 5

## 2012-11-28 MED ORDER — HYDROMORPHONE HCL PF 1 MG/ML IJ SOLN
INTRAMUSCULAR | Status: DC | PRN
  Administered 2012-11-28 (×2): 1 mg via INTRAVENOUS

## 2012-11-28 MED ORDER — HYDROMORPHONE HCL PF 1 MG/ML IJ SOLN
1.0000 mg | Freq: Once | INTRAMUSCULAR | Status: AC
  Administered 2012-11-28: 1 mg via INTRAVENOUS
  Filled 2012-11-28: qty 1

## 2012-11-28 MED ORDER — PROPOFOL 10 MG/ML IV BOLUS
INTRAVENOUS | Status: DC | PRN
  Administered 2012-11-28: 150 mg via INTRAVENOUS

## 2012-11-28 MED ORDER — ONDANSETRON HCL 4 MG PO TABS: 4.0000 mg | ORAL_TABLET | Freq: Four times a day (QID) | ORAL | Status: DC | PRN

## 2012-11-28 MED ORDER — FENTANYL CITRATE 0.05 MG/ML IJ SOLN
INTRAMUSCULAR | Status: DC | PRN
  Administered 2012-11-28: 100 ug via INTRAVENOUS

## 2012-11-28 MED ORDER — LACTATED RINGERS IV SOLN
INTRAVENOUS | Status: DC
  Administered 2012-11-28: 1000 mL via INTRAVENOUS

## 2012-11-28 MED ORDER — MORPHINE SULFATE 2 MG/ML IJ SOLN
0.5000 mg | INTRAMUSCULAR | Status: DC | PRN
  Administered 2012-11-28: 0.5 mg via INTRAVENOUS
  Filled 2012-11-28: qty 1

## 2012-11-28 MED ORDER — 0.9 % SODIUM CHLORIDE (POUR BTL) OPTIME
TOPICAL | Status: DC | PRN
  Administered 2012-11-28: 1000 mL

## 2012-11-28 MED ORDER — MIDAZOLAM HCL 5 MG/5ML IJ SOLN
INTRAMUSCULAR | Status: DC | PRN
  Administered 2012-11-28: 2 mg via INTRAVENOUS

## 2012-11-28 MED ORDER — SODIUM CHLORIDE 0.9 % IV BOLUS (SEPSIS)
1000.0000 mL | Freq: Once | INTRAVENOUS | Status: AC
  Administered 2012-11-28: 1000 mL via INTRAVENOUS

## 2012-11-28 MED ORDER — CEFAZOLIN SODIUM-DEXTROSE 2-3 GM-% IV SOLR
INTRAVENOUS | Status: AC
  Filled 2012-11-28: qty 50

## 2012-11-28 MED ORDER — SODIUM CHLORIDE 0.9 % IV SOLN
INTRAVENOUS | Status: DC
  Administered 2012-11-28 – 2012-11-29 (×2): via INTRAVENOUS
  Filled 2012-11-28 (×4): qty 1000

## 2012-11-28 MED ORDER — LACTATED RINGERS IV SOLN: INTRAVENOUS | Status: DC

## 2012-11-28 MED ORDER — HYDROMORPHONE HCL PF 1 MG/ML IJ SOLN
INTRAMUSCULAR | Status: AC
  Filled 2012-11-28: qty 1

## 2012-11-28 MED ORDER — METOCLOPRAMIDE HCL 5 MG/ML IJ SOLN: 5.0000 mg | Freq: Three times a day (TID) | INTRAMUSCULAR | Status: DC | PRN

## 2012-11-28 MED ORDER — LORAZEPAM 2 MG/ML IJ SOLN
1.0000 mg | Freq: Once | INTRAMUSCULAR | Status: AC
  Administered 2012-11-28: 1 mg via INTRAVENOUS
  Filled 2012-11-28: qty 1

## 2012-11-28 MED ORDER — HYDROMORPHONE HCL PF 1 MG/ML IJ SOLN
0.5000 mg | INTRAMUSCULAR | Status: DC | PRN
  Administered 2012-11-29 (×2): 2 mg via INTRAVENOUS
  Filled 2012-11-28 (×3): qty 2

## 2012-11-28 MED ORDER — ONDANSETRON HCL 4 MG/2ML IJ SOLN
INTRAMUSCULAR | Status: DC | PRN
  Administered 2012-11-28: 4 mg via INTRAVENOUS

## 2012-11-28 MED ORDER — KETOROLAC TROMETHAMINE 15 MG/ML IJ SOLN
15.0000 mg | Freq: Once | INTRAMUSCULAR | Status: AC
  Administered 2012-11-28: 15 mg via INTRAVENOUS
  Filled 2012-11-28: qty 1

## 2012-11-28 MED ORDER — ROCURONIUM BROMIDE 100 MG/10ML IV SOLN
INTRAVENOUS | Status: DC | PRN
  Administered 2012-11-28: 20 mg via INTRAVENOUS

## 2012-11-28 MED ORDER — NEOSTIGMINE METHYLSULFATE 1 MG/ML IJ SOLN
INTRAMUSCULAR | Status: DC | PRN
  Administered 2012-11-28: 3 mg via INTRAVENOUS

## 2012-11-28 MED ORDER — OXYCODONE HCL 5 MG PO TABS
5.0000 mg | ORAL_TABLET | ORAL | Status: DC | PRN
  Administered 2012-11-28 – 2012-11-29 (×3): 15 mg via ORAL
  Filled 2012-11-28 (×3): qty 3

## 2012-11-28 MED ORDER — ONDANSETRON HCL 4 MG/2ML IJ SOLN: 4.0000 mg | Freq: Four times a day (QID) | INTRAMUSCULAR | Status: DC | PRN

## 2012-11-28 MED ORDER — POLYETHYLENE GLYCOL 3350 17 G PO PACK: 17.0000 g | PACK | Freq: Every day | ORAL | Status: DC | PRN

## 2012-11-28 MED ORDER — ACETAMINOPHEN 325 MG PO TABS: 650.0000 mg | ORAL_TABLET | Freq: Four times a day (QID) | ORAL | Status: DC | PRN

## 2012-11-28 MED ORDER — HYDROMORPHONE HCL PF 1 MG/ML IJ SOLN
INTRAMUSCULAR | Status: AC
  Administered 2012-11-28: 1 mg via INTRAVENOUS
  Filled 2012-11-28: qty 1

## 2012-11-28 MED ORDER — GLYCOPYRROLATE 0.2 MG/ML IJ SOLN
INTRAMUSCULAR | Status: DC | PRN
  Administered 2012-11-28: .5 mg via INTRAVENOUS

## 2012-11-28 MED ORDER — MAGNESIUM CITRATE PO SOLN: 0.5000 | Freq: Once | ORAL | Status: AC | PRN

## 2012-11-28 MED ORDER — SUCCINYLCHOLINE CHLORIDE 20 MG/ML IJ SOLN
INTRAMUSCULAR | Status: DC | PRN
  Administered 2012-11-28: 100 mg via INTRAVENOUS

## 2012-11-28 MED ORDER — CEFAZOLIN SODIUM-DEXTROSE 2-3 GM-% IV SOLR
2.0000 g | Freq: Once | INTRAVENOUS | Status: AC
  Administered 2012-11-28: 2 g via INTRAVENOUS

## 2012-11-28 MED ORDER — METHOCARBAMOL 500 MG PO TABS
500.0000 mg | ORAL_TABLET | Freq: Four times a day (QID) | ORAL | Status: DC | PRN
  Administered 2012-11-28 – 2012-12-01 (×8): 500 mg via ORAL
  Filled 2012-11-28 (×8): qty 1

## 2012-11-28 MED ORDER — LIDOCAINE HCL (PF) 2 % IJ SOLN
INTRAMUSCULAR | Status: DC | PRN
  Administered 2012-11-28: 20 mg

## 2012-11-28 MED ORDER — DOCUSATE SODIUM 100 MG PO CAPS
100.0000 mg | ORAL_CAPSULE | Freq: Two times a day (BID) | ORAL | Status: DC
  Administered 2012-11-29 – 2012-12-01 (×5): 100 mg via ORAL

## 2012-11-28 MED ORDER — PHENOL 1.4 % MT LIQD
1.0000 | OROMUCOSAL | Status: DC | PRN
  Filled 2012-11-28: qty 177

## 2012-11-28 MED ORDER — ACETAMINOPHEN 650 MG RE SUPP: 650.0000 mg | Freq: Four times a day (QID) | RECTAL | Status: DC | PRN

## 2012-11-28 MED ORDER — ENOXAPARIN SODIUM 40 MG/0.4ML ~~LOC~~ SOLN
40.0000 mg | SUBCUTANEOUS | Status: DC
  Administered 2012-11-29 – 2012-12-01 (×3): 40 mg via SUBCUTANEOUS
  Filled 2012-11-28 (×4): qty 0.4

## 2012-11-28 MED ORDER — HYDROCODONE-ACETAMINOPHEN 5-325 MG PO TABS: 1.0000 | ORAL_TABLET | Freq: Four times a day (QID) | ORAL | Status: DC | PRN

## 2012-11-28 MED ORDER — HYDROMORPHONE HCL PF 1 MG/ML IJ SOLN
0.2500 mg | INTRAMUSCULAR | Status: DC | PRN
  Administered 2012-11-28 (×4): 0.5 mg via INTRAVENOUS

## 2012-11-28 MED ORDER — METOCLOPRAMIDE HCL 10 MG PO TABS: 5.0000 mg | ORAL_TABLET | Freq: Three times a day (TID) | ORAL | Status: DC | PRN

## 2012-11-28 MED ORDER — PROMETHAZINE HCL 25 MG/ML IJ SOLN: 6.2500 mg | INTRAMUSCULAR | Status: DC | PRN

## 2012-11-28 MED ORDER — ALBUTEROL SULFATE HFA 108 (90 BASE) MCG/ACT IN AERS
2.0000 | INHALATION_SPRAY | Freq: Four times a day (QID) | RESPIRATORY_TRACT | Status: DC | PRN
  Filled 2012-11-28: qty 6.7

## 2012-11-28 MED ORDER — MORPHINE SULFATE 4 MG/ML IJ SOLN
6.0000 mg | Freq: Once | INTRAMUSCULAR | Status: AC
  Administered 2012-11-28: 6 mg via INTRAVENOUS
  Filled 2012-11-28: qty 2

## 2012-11-28 MED ORDER — MENTHOL 3 MG MT LOZG
1.0000 | LOZENGE | OROMUCOSAL | Status: DC | PRN
  Filled 2012-11-28: qty 9

## 2012-11-28 SURGICAL SUPPLY — 54 items
BAG ZIPLOCK 12X15 (MISCELLANEOUS) IMPLANT
BANDAGE GAUZE ELAST BULKY 4 IN (GAUZE/BANDAGES/DRESSINGS) ×2 IMPLANT
BIT DRILL CANN LG 4.3MM (BIT) ×1 IMPLANT
BLADE SURG 15 STRL LF DISP TIS (BLADE) IMPLANT
BLADE SURG 15 STRL SS (BLADE)
CLOTH BEACON ORANGE TIMEOUT ST (SAFETY) ×2 IMPLANT
DERMABOND ADVANCED (GAUZE/BANDAGES/DRESSINGS) ×2
DERMABOND ADVANCED .7 DNX12 (GAUZE/BANDAGES/DRESSINGS) ×2 IMPLANT
DRAPE ORTHO SPLIT 77X108 STRL (DRAPES)
DRAPE STERI IOBAN 125X83 (DRAPES) ×2 IMPLANT
DRAPE SURG ORHT 6 SPLT 77X108 (DRAPES) IMPLANT
DRAPE TABLE BACK 44X90 PK DISP (DRAPES) IMPLANT
DRILL BIT CANN LG 4.3MM (BIT) ×2
DRSG AQUACEL AG ADV 3.5X 4 (GAUZE/BANDAGES/DRESSINGS) ×4 IMPLANT
DRSG AQUACEL AG ADV 3.5X 6 (GAUZE/BANDAGES/DRESSINGS) ×2 IMPLANT
DRSG PAD ABDOMINAL 8X10 ST (GAUZE/BANDAGES/DRESSINGS) IMPLANT
DURAPREP 26ML APPLICATOR (WOUND CARE) ×2 IMPLANT
ELECT REM PT RETURN 9FT ADLT (ELECTROSURGICAL) ×2
ELECTRODE REM PT RTRN 9FT ADLT (ELECTROSURGICAL) ×1 IMPLANT
GLOVE BIOGEL PI IND STRL 7.5 (GLOVE) ×2 IMPLANT
GLOVE BIOGEL PI IND STRL 8 (GLOVE) IMPLANT
GLOVE BIOGEL PI INDICATOR 7.5 (GLOVE) ×2
GLOVE BIOGEL PI INDICATOR 8 (GLOVE)
GLOVE ECLIPSE 8.0 STRL XLNG CF (GLOVE) IMPLANT
GLOVE ORTHO TXT STRL SZ7.5 (GLOVE) ×4 IMPLANT
GLOVE SURG ORTHO 8.0 STRL STRW (GLOVE) IMPLANT
GLOVE SURG SS PI 6.5 STRL IVOR (GLOVE) ×2 IMPLANT
GLOVE SURG SS PI 7.5 STRL IVOR (GLOVE) ×4 IMPLANT
GOWN BRE IMP PREV XXLGXLNG (GOWN DISPOSABLE) ×4 IMPLANT
GOWN STRL NON-REIN LRG LVL3 (GOWN DISPOSABLE) ×2 IMPLANT
GUIDEPIN 3.2X17.5 THRD DISP (PIN) ×2 IMPLANT
HIP FRAC NAIL LAG SCR 10.5X100 (Orthopedic Implant) ×1 IMPLANT
KIT BASIN OR (CUSTOM PROCEDURE TRAY) ×2 IMPLANT
MANIFOLD NEPTUNE II (INSTRUMENTS) IMPLANT
NAIL HIP FRACT 130D 11X180 (Screw) ×2 IMPLANT
NS IRRIG 1000ML POUR BTL (IV SOLUTION) ×2 IMPLANT
PACK GENERAL/GYN (CUSTOM PROCEDURE TRAY) ×2 IMPLANT
PADDING CAST ABS 6INX4YD NS (CAST SUPPLIES) ×1
PADDING CAST ABS COTTON 6X4 NS (CAST SUPPLIES) ×1 IMPLANT
PENCIL BUTTON HOLSTER BLD 10FT (ELECTRODE) IMPLANT
POSITIONER SURGICAL ARM (MISCELLANEOUS) ×2 IMPLANT
SCREW BONE CORTICAL 5.0X36 (Screw) ×2 IMPLANT
SCREW CANN THRD AFF 10.5X100 (Orthopedic Implant) ×1 IMPLANT
SET PAD KNEE POSITIONER (MISCELLANEOUS) IMPLANT
SPONGE GAUZE 4X4 12PLY (GAUZE/BANDAGES/DRESSINGS) IMPLANT
STAPLER VISISTAT 35W (STAPLE) IMPLANT
SUT MNCRL AB 4-0 PS2 18 (SUTURE) ×2 IMPLANT
SUT VIC AB 0 CT1 27 (SUTURE) ×1
SUT VIC AB 0 CT1 27XBRD ANTBC (SUTURE) ×1 IMPLANT
SUT VIC AB 2-0 CT1 27 (SUTURE) ×1
SUT VIC AB 2-0 CT1 TAPERPNT 27 (SUTURE) ×1 IMPLANT
TOWEL OR 17X26 10 PK STRL BLUE (TOWEL DISPOSABLE) ×4 IMPLANT
TRAY FOLEY CATH 14FRSI W/METER (CATHETERS) ×2 IMPLANT
WATER STERILE IRR 1500ML POUR (IV SOLUTION) IMPLANT

## 2012-11-28 NOTE — Transfer of Care (Signed)
Immediate Anesthesia Transfer of Care Note  Patient: Emily Moran  Procedure(s) Performed: Procedure(s) (LRB): INTRAMEDULLARY (IM) RETROGRADE FEMORAL NAILING wants jackson table , c-arm and biomet  (Right)  Patient Location: PACU  Anesthesia Type: General  Level of Consciousness: sedated, patient cooperative and responds to stimulaton  Airway & Oxygen Therapy: Patient Spontanous Breathing and Patient connected to face mask oxgen  Post-op Assessment: Report given to PACU RN and Post -op Vital signs reviewed and stable  Post vital signs: Reviewed and stable  Complications: No apparent anesthesia complications

## 2012-11-28 NOTE — H&P (Signed)
[facility] History and Physical  Emily Moran:[id number] DOB: 1956-05-29 DOA: 11/28/2012  Referring physician: Dr. Charlann Boxer PCP: No primary provider on file.  Specialists: Dr. Charlann Boxer with orthopaedic surgery  Chief Complaint: fall with right intertrochanteric femur fx in pt with copd  HPI: Emily Moran is a 58 y.o. female  With h/o of COPD on home oxygen and DJD.  Was walking dog when he decided to chase a cat jerked the leash and patient subsequently fell.  Due to persistent pain and worsening pain with weight bearing patient was brought to the ED for further evaluation and recommendations.    Initial evaluation showed a right intertrochanteric femur fracture in ED.  Orthopaedic surgery was called and patient take to surgery for ORIF.  I was paged by orthopaedic surgeon after procedure for admission orders and recommendations for patient's underlying COPD.  Review of Systems: 10 point review of system reviewed and negative unless otherwise mentioned above  Past Medical History  Diagnosis Date  . COPD (chronic obstructive pulmonary disease)   . Degenerative disc disease   . Spinal stenosis   . DDD (degenerative disc disease)   . DDD (degenerative disc disease)   . Hypertension   . Degenerative disc disease    Past Surgical History  Procedure Laterality Date  . Neck surgery    . Abdominal hysterectomy    . Bladder surgery     Social History:  reports that she has been smoking Cigarettes.  She has been smoking about 0.50 packs per day. She has never used smokeless tobacco. She reports that she does not drink alcohol or use illicit drugs.  where does patient live--home, ALF, SNF? and with whom if at home? [place]  Can patient participate in ADLs? Yes prior to presentation to the ED  No Known Allergies  History reviewed. No pertinent family history.  Mother had cancer reportedly  Prior to Admission medications   Medication Sig Start Date End  Date Taking? Authorizing Provider  albuterol (PROVENTIL HFA) 108 (90 BASE) MCG/ACT inhaler Inhale 2 puffs into the lungs every 6 (six) hours as needed. For shortness of breath   Yes Historical Provider, MD  ibuprofen (ADVIL,MOTRIN) 200 MG tablet Take 400 mg by mouth every 8 (eight) hours as needed for pain.   Yes Historical Provider, MD  oxyCODONE (ROXICODONE) 15 MG immediate release tablet Take 15 mg by mouth every 4 (four) hours as needed. For pain. Do not exceed 5 tabs per day.   Yes Historical Provider, MD   Physical Exam: Filed Vitals:   11/28/12 1830 11/28/12 1845 11/28/12 1900 11/28/12 1915  BP: 162/86 136/83 140/88   Pulse: 72 70 68   Temp:   98.7 F (37.1 C)   TempSrc:      Resp: 12 12 16 14   Height:      Weight:      SpO2: 96% 92% 95% 96%     General:  Pt in NAD, alert and awake. Patient appears older than stated age  Eyes: EOMI, non icteric  ENT: normal exterior appearance, no masses on visual examination  Neck: supple, no goiter  Cardiovascular: RRR, No MRG  Respiratory: CTA BL, no wheezes, prolonged exp. Phase.  Abdomen: soft, NT, ND  Skin: warm and dry  Musculoskeletal: no cyanosis  Psychiatric: mood and affect appropriate  Neurologic: answers questions appropriately, no facial asymmetry, no slurred speech  Labs on Admission:  Basic Metabolic Panel:  Recent Labs Lab 11/28/12  0950  NA 136  K 3.6  CL 102  CO2 22  GLUCOSE 92  BUN 5*  CREATININE 0.68  CALCIUM 9.3   Liver Function Tests: No results found for this basename: AST, ALT, ALKPHOS, BILITOT, PROT, ALBUMIN,  in the last 168 hours No results found for this basename: LIPASE, AMYLASE,  in the last 168 hours No results found for this basename: AMMONIA,  in the last 168 hours CBC:  Recent Labs Lab 11/28/12 0950  WBC 13.0*  HGB 13.2  HCT 39.4  MCV 91.6  PLT 270   Cardiac Enzymes: No results found for this basename: CKTOTAL, CKMB, CKMBINDEX, TROPONINI,  in the last 168 hours  BNP  (last 3 results) No results found for this basename: PROBNP,  in the last 8760 hours CBG: No results found for this basename: GLUCAP,  in the last 168 hours  Radiological Exams on Admission: Dg Hip Complete Right  11/28/2012  *RADIOLOGY REPORT*  Clinical Data: Fall, leg pain.  RIGHT HIP - COMPLETE 2+ VIEW  Comparison: None  Findings: There is a right femoral intertrochanteric fracture. Mild displacement.  No significant angulation.  No subluxation or dislocation.  Hip joints and SI joints are symmetric and unremarkable.  IMPRESSION: Right femoral intertrochanteric fracture.   Original Report Authenticated By: Charlett Nose, M.D.    Dg Hip Operative Right  11/28/2012  *RADIOLOGY REPORT*  Clinical Data: Intertrochanteric hip fracture.  DG OPERATIVE RIGHT HIP  Comparison: 11/28/2012.  Findings: Three intraoperative fluoroscopic spot films demonstrate interval ORIF of the previously noted intertrochanteric hip fracture with a gamma nail device.  Anatomic alignment has been preserved.  The hip is located.  IMPRESSION: 1.  Intraoperative documentation of gamma nail fixation for intertrochanteric right hip fracture, as above.   Original Report Authenticated By: Trudie Reed, M.D.    Dg Femur Right  11/28/2012  *RADIOLOGY REPORT*  Clinical Data: Fall, right hip pain.  RIGHT FEMUR - 2 VIEW  Comparison: Pelvis images performed today.  Findings: There is a mildly displaced right intertrochanteric proximal femoral fracture as seen on pelvis images.  No subluxation or dislocation.  Remainder of the femur is intact.  IMPRESSION: Right femoral intertrochanteric fracture.   Original Report Authenticated By: Charlett Nose, M.D.    Dg Chest Port 1 View  11/28/2012  *RADIOLOGY REPORT*  Clinical Data: Preoperative chest radiograph.  Chronic cigarette smoker.  PORTABLE CHEST - 1 VIEW  Comparison: 10/29/2011.  Findings: Lower cervical ACDF.  Bullous emphysema in the right upper lobe.  No pneumothorax.  The  cardiopericardial silhouette is within normal limits. Monitoring leads are projected over the chest.  Both lungs appear clear.  There is basilar atelectasis associated with hyperexpansion of the upper lobes and emphysema.  IMPRESSION: Emphysema without acute cardiopulmonary disease.  Severe bullous emphysema of the right upper lobe.   Original Report Authenticated By: Andreas Newport, M.D.     EKG: Independently reviewed. Sinus Rhythm no St elevation or depression  Assessment/Plan Active Problems: R intertrochanteric fracture COPD   1. R intertrochanteric fracture - routine hip fracture protocol. Please review orders for details - defer DVT prophylaxis, pain management, and Physical therapy recommendations to Ortho  2. COPD - Continue supplemental oxygen and bronchodilators.   Code Status: full code Family Communication: no family at bedside.  Disposition Plan: Pending further evaluation and recommendations by PT and ortho Case manager and social worker consulted.  Time spent:   Penny Pia [facility] Pager [phone]  If 7PM-7AM, please contact night-coverage [email or web address] Password  [facility] 11/28/2012, 7:24 PM

## 2012-11-28 NOTE — Consult Note (Signed)
  Reason for Consult: Right proximal femur fracture, intertrochanteric Referring Physician:  ED physicians  Emily Moran is an 57 y.o. female.  HPI: 57 yo female who was walking her dog today when the dog jerked her down to the ground when the dog went after a cat.  No other complaints of upper extremity issues  Past Medical History  Diagnosis Date  . COPD (chronic obstructive pulmonary disease)   . Degenerative disc disease   . Spinal stenosis   . DDD (degenerative disc disease)   . DDD (degenerative disc disease)   . Hypertension   . Degenerative disc disease     Past Surgical History  Procedure Laterality Date  . Neck surgery    . Abdominal hysterectomy    . Bladder surgery      History reviewed. No pertinent family history.  Social History:  reports that she has been smoking Cigarettes.  She has been smoking about 0.50 packs per day. She has never used smokeless tobacco. She reports that she does not drink alcohol or use illicit drugs.  Allergies: No Known Allergies  Medications: I have reviewed the patient's current medications. Scheduled:  Results for orders placed during the hospital encounter of 11/28/12 (from the past 24 hour(s))  CBC     Status: Abnormal   Collection Time    11/28/12  9:50 AM      Result Value Range   WBC 13.0 (*) 4.0 - 10.5 K/uL   RBC 4.30  3.87 - 5.11 MIL/uL   Hemoglobin 13.2  12.0 - 15.0 g/dL   HCT 16.1  09.6 - 04.5 %   MCV 91.6  78.0 - 100.0 fL   MCH 30.7  26.0 - 34.0 pg   MCHC 33.5  30.0 - 36.0 g/dL   RDW 40.9  81.1 - 91.4 %   Platelets 270  150 - 400 K/uL  BASIC METABOLIC PANEL     Status: Abnormal   Collection Time    11/28/12  9:50 AM      Result Value Range   Sodium 136  135 - 145 mEq/L   Potassium 3.6  3.5 - 5.1 mEq/L   Chloride 102  96 - 112 mEq/L   CO2 22  19 - 32 mEq/L   Glucose, Bld 92  70 - 99 mg/dL   BUN 5 (*) 6 - 23 mg/dL   Creatinine, Ser 7.82  0.50 - 1.10 mg/dL   Calcium 9.3  8.4 - 95.6 mg/dL   GFR calc non Af  Amer >90  >90 mL/min   GFR calc Af Amer >90  >90 mL/min    X-ray: Right intertrochanteric femur fracture  History of copd nocturnal oxygen use No recent hospitalizations, no chest pain No productive cough, no fever chills, night sweats  Blood pressure 144/78, pulse 70, temperature 98.9 F (37.2 C), temperature source Oral, resp. rate 14, height 5\' 7"  (1.702 m), weight 56.7 kg (125 lb), SpO2 99.00%.  Exam Seen and evaluated in the OR holding area Currently receive O2 in holding area O2 sats in the low 90s without O2 Pain with motion of her right hip Otherwise NVI Lungs clear distant  Heart regular rate regular  Assessment/Plan: Right intertrochanteric femur fracture  Reviewed fracture pattern Plan to go to the OR tonight for ORIF right hip fracture Consent obtained and on chart  Vonnie Ligman D 11/28/2012, 4:42 PM

## 2012-11-28 NOTE — ED Notes (Signed)
MD at bedside. 

## 2012-11-28 NOTE — ED Notes (Signed)
[id number]<AV> Expected date:<BR> Expected time:<BR> Means of arrival:<BR> Comments:<BR> ems-thigh pain

## 2012-11-28 NOTE — Anesthesia Postprocedure Evaluation (Signed)
Anesthesia Post Note  Patient: Emily Moran  Procedure(s) Performed: Procedure(s) (LRB): INTRAMEDULLARY (IM) RETROGRADE FEMORAL NAILING wants jackson table , c-arm and biomet  (Right)  Anesthesia type: General  Patient location: PACU  Post pain: Pain level controlled  Post assessment: Post-op Vital signs reviewed  Last Vitals:  Filed Vitals:   11/28/12 1830  BP: 162/86  Pulse: 72  Temp:   Resp: 12    Post vital signs: Reviewed  Level of consciousness: sedated  Complications: No apparent anesthesia complications

## 2012-11-28 NOTE — ED Notes (Signed)
Patient transported to X-ray 

## 2012-11-28 NOTE — ED Notes (Signed)
Pt transferred to OR via stretcher with chart and personal belongings. Condition stable at this time.

## 2012-11-28 NOTE — ED Provider Notes (Signed)
History    57 year old female who with right hip and right thigh pain. Patient had a mechanical fall at approximately 7 PM yesterday. Patient was pulled over by her dog. She fell onto her side. Persistent pain since then. Has not been able to ambulate. She is sleeping to see if her symptoms would be improved in the morning. Did not significantly improve so she presented to the emergency room. No other complaints. Denies significant pain elsewhere. No numbness or tingling. She last ate yesterday.  CSN: [id number]  Arrival date & time 11/28/12  0807   First MD Initiated Contact with Patient 11/28/12 0813      Chief Complaint  Patient presents with  . Fall  . Leg Pain    right    (Consider location/radiation/quality/duration/timing/severity/associated sxs/prior treatment) HPI  Past Medical History  Diagnosis Date  . COPD (chronic obstructive pulmonary disease)   . Degenerative disc disease   . Spinal stenosis   . DDD (degenerative disc disease)   . DDD (degenerative disc disease)   . Hypertension   . Degenerative disc disease     Past Surgical History  Procedure Laterality Date  . Neck surgery    . Abdominal hysterectomy    . Bladder surgery      History reviewed. No pertinent family history.  History  Substance Use Topics  . Smoking status: Former Smoker -- 2.00 packs/day  . Smokeless tobacco: Never Used  . Alcohol Use: No     Comment: History of ETOH abuse    OB History   Grav Para Term Preterm Abortions TAB SAB Ect Mult Living                  Review of Systems  All systems reviewed and negative, other than as noted in HPI.   Allergies  Review of patient's allergies indicates no known allergies.  Home Medications   Current Outpatient Rx  Name  Route  Sig  Dispense  Refill  . albuterol (PROVENTIL HFA) 108 (90 BASE) MCG/ACT inhaler   Inhalation   Inhale 2 puffs into the lungs every 6 (six) hours as needed. For shortness of breath         .  ibuprofen (ADVIL,MOTRIN) 200 MG tablet   Oral   Take 400 mg by mouth every 8 (eight) hours as needed for pain.         Marland Kitchen oxyCODONE (ROXICODONE) 15 MG immediate release tablet   Oral   Take 15 mg by mouth every 4 (four) hours as needed. For pain. Do not exceed 5 tabs per day.           BP 121/61  Pulse 77  Temp(Src) 99.1 F (37.3 C) (Oral)  Resp 18  Ht 5\' 7"  (1.702 m)  Wt 125 lb (56.7 kg)  BMI 19.57 kg/m2  SpO2 94%  Physical Exam  Nursing note and vitals reviewed. Constitutional: She appears well-developed and well-nourished. No distress.  Laying in bed. Uncomfortable appearing.  HENT:  Head: Normocephalic and atraumatic.  Eyes: Conjunctivae are normal. Right eye exhibits no discharge. Left eye exhibits no discharge.  Neck: Neck supple.  Cardiovascular: Normal rate, regular rhythm and normal heart sounds.  Exam reveals no gallop and no friction rub.   No murmur heard. Pulmonary/Chest: Effort normal and breath sounds normal. No respiratory distress.  Abdominal: Soft. She exhibits no distension. There is no tenderness.  Musculoskeletal: She exhibits no edema and no tenderness.  Right lower extremity appears to be slightly externally rotated,  but not shortened. Severe tenderness in the proximal femur and hip area. Severe pain and resistance to any type of range of motion of her right hip. Small abrasions her right knee and elbow without any significant bony tenderness. Neurovascularly intact distally.  Neurological: She is alert.  Skin: Skin is warm and dry.  Psychiatric: She has a normal mood and affect. Her behavior is normal. Thought content normal.    ED Course  Procedures (including critical care time)  Labs Reviewed - No data to display Dg Hip Complete Right  11/28/2012  *RADIOLOGY REPORT*  Clinical Data: Fall, leg pain.  RIGHT HIP - COMPLETE 2+ VIEW  Comparison: None  Findings: There is a right femoral intertrochanteric fracture. Mild displacement.  No significant  angulation.  No subluxation or dislocation.  Hip joints and SI joints are symmetric and unremarkable.  IMPRESSION: Right femoral intertrochanteric fracture.   Original Report Authenticated By: Charlett Nose, M.D.    Dg Femur Right  11/28/2012  *RADIOLOGY REPORT*  Clinical Data: Fall, right hip pain.  RIGHT FEMUR - 2 VIEW  Comparison: Pelvis images performed today.  Findings: There is a mildly displaced right intertrochanteric proximal femoral fracture as seen on pelvis images.  No subluxation or dislocation.  Remainder of the femur is intact.  IMPRESSION: Right femoral intertrochanteric fracture.   Original Report Authenticated By: Charlett Nose, M.D.    Dg Chest Port 1 View  11/28/2012  *RADIOLOGY REPORT*  Clinical Data: Preoperative chest radiograph.  Chronic cigarette smoker.  PORTABLE CHEST - 1 VIEW  Comparison: 10/29/2011.  Findings: Lower cervical ACDF.  Bullous emphysema in the right upper lobe.  No pneumothorax.  The cardiopericardial silhouette is within normal limits. Monitoring leads are projected over the chest.  Both lungs appear clear.  There is basilar atelectasis associated with hyperexpansion of the upper lobes and emphysema.  IMPRESSION: Emphysema without acute cardiopulmonary disease.  Severe bullous emphysema of the right upper lobe.   Original Report Authenticated By: Andreas Newport, M.D.    EKG:  Rhythm: normal sinus Vent. rate 82 BPM PR interval 140 ms QRS duration 102 ms QT/QTc 364/425 ms Axis: normal Anterior q waves ST segments: NS ST changes   1. Intertrochanteric fracture, closed       MDM  57 year old female with right proximal leg and hip pain. Imaging significant for a mildly displaced right intertrochanteric femur fracture. Neurovascularly intact. Will discuss with orthopedic surgery.        Raeford Razor, MD 11/28/12 [id number]

## 2012-11-28 NOTE — Brief Op Note (Signed)
11/28/2012  6:12 PM  PATIENT:  Emily Moran  57 y.o. female  PRE-OPERATIVE DIAGNOSIS:  intertrochanteric fracture right hip   POST-OPERATIVE DIAGNOSIS:  intertrochanteric fracture right hip  PROCEDURE:  Procedure(s): INTRAMEDULLARY (IM) RETROGRADE FEMORAL NAILING wants jackson table , c-arm and biomet  (Right)  SURGEON:  Surgeon(s) and Role:    * Shelda Pal, MD - Primary  PHYSICIAN ASSISTANT: None  ANESTHESIA:   general  EBL:  Total I/O In: -  Out: 750 [Urine:650; Blood:100]  BLOOD ADMINISTERED:none  DRAINS: none   LOCAL MEDICATIONS USED:  NONE  SPECIMEN:  No Specimen  DISPOSITION OF SPECIMEN:  N/A  COUNTS:  YES  TOURNIQUET:  * No tourniquets in log *  DICTATION: .Other Dictation: Dictation Number [id number]  PLAN OF CARE: Admit to inpatient   PATIENT DISPOSITION:  PACU - hemodynamically stable.   Delay start of Pharmacological VTE agent (>24hrs) due to surgical blood loss or risk of bleeding: no

## 2012-11-28 NOTE — Anesthesia Preprocedure Evaluation (Addendum)
Anesthesia Evaluation  Patient identified by MRN, date of birth, ID band Patient awake    Reviewed: Allergy & Precautions, H&P , NPO status , Patient's Chart, lab work & pertinent test results  Airway Mallampati: II TM Distance: >3 FB Neck ROM: Full    Dental  (+) Poor Dentition, Missing, Dental Advisory Given, Edentulous Upper, Partial Lower and Upper Dentures   Pulmonary pneumonia -, COPDCurrent Smoker,  breath sounds clear to auscultation  Pulmonary exam normal       Cardiovascular hypertension, Rhythm:Regular Rate:Normal     Neuro/Psych Spinal stenosis negative neurological ROS  negative psych ROS   GI/Hepatic negative GI ROS, (+)     substance abuse   , Hepatitis -, C  Endo/Other  negative endocrine ROS  Renal/GU negative Renal ROS  negative genitourinary   Musculoskeletal negative musculoskeletal ROS (+)   Abdominal   Peds  Hematology negative hematology ROS (+)   Anesthesia Other Findings Patient has chronic pain with narcotic dependence. Takes Oxycodone 15mg  QID Patient states she had a bite of egg sandwich at 0700 this AM otherwise NPO  Reproductive/Obstetrics negative OB ROS                          Anesthesia Physical Anesthesia Plan  ASA: III and emergent  Anesthesia Plan: General   Post-op Pain Management:    Induction: Intravenous  Airway Management Planned: Oral ETT  Additional Equipment:   Intra-op Plan:   Post-operative Plan: Extubation in OR  Informed Consent: I have reviewed the patients History and Physical, chart, labs and discussed the procedure including the risks, benefits and alternatives for the proposed anesthesia with the patient or authorized representative who has indicated his/her understanding and acceptance.   Dental advisory given  Plan Discussed with: CRNA  Anesthesia Plan Comments:         Anesthesia Quick Evaluation

## 2012-11-29 DIAGNOSIS — R109 Unspecified abdominal pain: Secondary | ICD-10-CM

## 2012-11-29 DIAGNOSIS — B1710 Acute hepatitis C without hepatic coma: Secondary | ICD-10-CM

## 2012-11-29 DIAGNOSIS — D62 Acute posthemorrhagic anemia: Secondary | ICD-10-CM

## 2012-11-29 LAB — CBC
HCT: 28.3 % — ABNORMAL LOW (ref 36.0–46.0)
MCV: 93.7 fL (ref 78.0–100.0)
RBC: 3.02 MIL/uL — ABNORMAL LOW (ref 3.87–5.11)
WBC: 9.1 10*3/uL (ref 4.0–10.5)

## 2012-11-29 LAB — BASIC METABOLIC PANEL
BUN: 5 mg/dL — ABNORMAL LOW (ref 6–23)
CO2: 23 mEq/L (ref 19–32)
Chloride: 109 mEq/L (ref 96–112)
Creatinine, Ser: 0.75 mg/dL (ref 0.50–1.10)

## 2012-11-29 MED ORDER — INFLUENZA VIRUS VACC SPLIT PF IM SUSP
0.5000 mL | Freq: Once | INTRAMUSCULAR | Status: AC
  Administered 2012-11-29: 0.5 mL via INTRAMUSCULAR
  Filled 2012-11-29: qty 0.5

## 2012-11-29 MED ORDER — OXYCODONE HCL 5 MG PO TABS
5.0000 mg | ORAL_TABLET | ORAL | Status: DC | PRN
  Administered 2012-11-29: 15 mg via ORAL
  Filled 2012-11-29: qty 3

## 2012-11-29 MED ORDER — HYDROCODONE-ACETAMINOPHEN 5-325 MG PO TABS: 1.0000 | ORAL_TABLET | ORAL | Status: DC | PRN

## 2012-11-29 MED ORDER — OXYCODONE HCL 5 MG PO TABS
15.0000 mg | ORAL_TABLET | ORAL | Status: DC | PRN
  Administered 2012-11-29 – 2012-11-30 (×7): 15 mg via ORAL
  Filled 2012-11-29 (×7): qty 3

## 2012-11-29 MED ORDER — OXYCODONE HCL 5 MG PO TABS: 5.0000 mg | ORAL_TABLET | ORAL | Status: DC | PRN

## 2012-11-29 MED ORDER — HYDROMORPHONE HCL PF 2 MG/ML IJ SOLN
2.0000 mg | INTRAMUSCULAR | Status: DC | PRN
  Administered 2012-11-29 – 2012-11-30 (×10): 2 mg via INTRAVENOUS
  Filled 2012-11-29 (×10): qty 1

## 2012-11-29 NOTE — Evaluation (Signed)
Physical Therapy Evaluation Patient Details Name: Emily Moran MRN: [id number] DOB: October 20, 1955 Today's Date: 11/29/2012 Time: 4098-1191 PT Time Calculation (min): 31 min  PT Assessment / Plan / Recommendation Clinical Impression  Pt s/p R hip fx with ORIF presents with decreased R LE strength/ROm and post op pain limiting functional mobility    PT Assessment  Patient needs continued PT services    Follow Up Recommendations  Home health PT;SNF (dependent on acute stay progress and home assist level avail)    Does the patient have the potential to tolerate intense rehabilitation      Barriers to Discharge Decreased caregiver support      Equipment Recommendations  None recommended by PT    Recommendations for Other Services OT consult   Frequency 7X/week    Precautions / Restrictions Precautions Precautions: Fall Restrictions Weight Bearing Restrictions: No Other Position/Activity Restrictions: WBAT   Pertinent Vitals/Pain 9/10 pain but pt smiling with report - premed, ice pack provided      Mobility  Bed Mobility Bed Mobility: Supine to Sit Supine to Sit: 3: Mod assist Details for Bed Mobility Assistance: cues for sequence and use of L LE to self assist Transfers Transfers: Sit to Stand;Stand to Sit Sit to Stand: 3: Mod assist Stand to Sit: 3: Mod assist Details for Transfer Assistance: cues for LE management and use of UEs to self assist Ambulation/Gait Ambulation/Gait Assistance: 4: Min assist;3: Mod assist Ambulation Distance (Feet): 34 Feet Assistive device: Rolling walker Ambulation/Gait Assistance Details: cues for sequence, posture, and position from RW Gait Pattern: Step-to pattern;Decreased step length - right;Decreased step length - left;Decreased stance time - right Stairs: No    Exercises Total Joint Exercises Ankle Circles/Pumps: AROM;Both;10 reps;Supine Quad Sets: AROM;10 reps;Both;Supine Heel Slides: AAROM;10 reps;Right;Supine Hip  ABduction/ADduction: AAROM;10 reps;Right;Supine   PT Diagnosis: Difficulty walking  PT Problem List: Decreased strength;Decreased range of motion;Decreased activity tolerance;Decreased mobility;Decreased knowledge of use of DME;Pain PT Treatment Interventions: DME instruction;Gait training;Functional mobility training;Therapeutic activities;Therapeutic exercise;Patient/family education   PT Goals Acute Rehab PT Goals PT Goal Formulation: With patient Time For Goal Achievement: 12/05/12 Potential to Achieve Goals: Good Pt will go Supine/Side to Sit: with modified independence PT Goal: Supine/Side to Sit - Progress: Goal set today Pt will go Sit to Supine/Side: with modified independence PT Goal: Sit to Supine/Side - Progress: Goal set today Pt will go Sit to Stand: with modified independence PT Goal: Sit to Stand - Progress: Goal set today Pt will go Stand to Sit: with modified independence PT Goal: Stand to Sit - Progress: Goal set today Pt will Ambulate: 51 - 150 feet;with modified independence;with rolling walker;with standard walker PT Goal: Ambulate - Progress: Goal set today  Visit Information  Last PT Received On: 11/29/12 Assistance Needed: +1    Subjective Data  Subjective: I've been afraid to move but it feels good to get out of this bed Patient Stated Goal: Resume previous lifestyle asap   Prior Functioning  Home Living Lives With: Family;Friend(s) Available Help at Discharge: Family;Friend(s) (intermittant assist available) Type of Home: House Home Access: Level entry Home Layout: One level Home Adaptive Equipment: Walker - standard Prior Function Level of Independence: Independent Able to Take Stairs?: Yes Driving: Yes Communication Communication: No difficulties Dominant Hand: Right    Cognition  Cognition Overall Cognitive Status: Appears within functional limits for tasks assessed/performed Arousal/Alertness: Awake/alert Orientation Level: Appears  intact for tasks assessed Behavior During Session: [facility] for tasks performed    Extremity/Trunk Assessment Right Upper Extremity  Assessment RUE ROM/Strength/Tone: [facility] for tasks assessed Left Upper Extremity Assessment LUE ROM/Strength/Tone: WFL for tasks assessed Right Lower Extremity Assessment RLE ROM/Strength/Tone: Deficits RLE ROM/Strength/Tone Deficits: hip strength 2/5 with AAROM to 75 flex and 15 abd Left Lower Extremity Assessment LLE ROM/Strength/Tone: WFL for tasks assessed   Balance    End of Session PT - End of Session Equipment Utilized During Treatment: Gait belt Activity Tolerance: Patient tolerated treatment well Patient left: in chair;with call bell/phone within reach;with family/visitor present Nurse Communication: Mobility status  GP     Emberlie Gotcher 11/29/2012, 2:55 PM

## 2012-11-29 NOTE — Op Note (Signed)
NAMEZARAY, GATCHEL NO.:  [id number]  MEDICAL RECORD NO.:  [id number]  LOCATION:  1613                         FACILITY:  [facility]  PHYSICIAN:  Madlyn Frankel. Charlann Boxer, M.D.  DATE OF BIRTH:  August 04, 1956  DATE OF PROCEDURE:  11/28/2012 DATE OF DISCHARGE:                              OPERATIVE REPORT   PREOPERATIVE DIAGNOSIS:  Right intertrochanteric femur fracture.  POSTOPERATIVE DIAGNOSIS:  Right intertrochanteric femur fracture.  PROCEDURE:  Open reduction and internal fixation of right intertrochanteric femur fracture utilizing a Biomet Affixus nail, 11 x 180 mm, 130-degree lag screw measuring 100 mm and distal interlock.  SURGEON:  Madlyn Frankel. Charlann Boxer, M.D.  ASSISTANT:  Surgical team.  ANESTHESIA:  General.  BLOOD LOSS:  About 150 mL.  COMPLICATION:  None.  INDICATION FOR PROCEDURE:  Ms. Arca is a 57 year old female with medical comorbidities including COPD, nocturnal oxygen dependence, was walking her dog when the dog jerked her down to the ground after chasing the cat.  She landed on her right hip.  She had immediate onset of pain and was eventually brought to the emergency room where radiographs revealed a right intertrochanteric femur fracture.  She was immediately seen and evaluated by the medical physicians as well as emergency room physicians and Orthopedics was consulted for evaluation and treatment. Consult provided.  After reviewing with she and her family the fracture pattern and the planned procedure, consent was obtained after reviewing the necessity for fixation, the risks of infection, nonunion, and need for future surgery.  PROCEDURE IN DETAIL:  The patient was brought to the operative theater. Once adequate anesthesia, preoperative antibiotics, Ancef administered, she was positioned supine on the fracture table.  Her left unaffected extremity was flexed and abducted out of the way with bony prominences padded particularly over the lateral  peroneal nerve region.  Her right foot was placed in the traction boot.  Traction internal rotation was applied against the padded perineal post.  Fluoroscopy was brought into the field to confirm reduction of the fracture.  At this point, the right hip was prepped and draped in sterile fashion using shower curtain technique from the iliac crest to the mid-to-distal thigh.  Time-out was performed identifying the patient, planned procedure, and extremity.  Lateral incision was made proximal to the tip of the trochanter.  Sharp dissection was carried through the gluteal fascia.  Guidewire was then inserted through the tip of the trochanter and confirmed radiographically.  Proximal femur was then drilled opened and the intramedullary nail passed by hand to appropriate depth under fluoroscopic imaging using the insertion guide.  A guidewire was placed into the center of the femoral head in AP and lateral planes.  It was slightly inferior on the AP view, but the tip-apex distance was less than 20 mm.  I measured the depth, reamed for a 100-mm screw and placed a 100-mm lag screw.  With the screw in place, I then used the compression wheel and compressed approximately 5 mm medializing the shaft to the intertrochanteric line.  I then tightened down the screw proximally, backed it off a quarter turn to allow for further compression with weightbearing.  Distal interlock was then placed  through the insertion jig measuring 36 mm.  At this point, final radiographs were obtained in AP and lateral planes. The proximal gluteal fascia was reapproximated using #1 Vicryl, the remainder of the wound was closed with 2-0 Vicryl and running 4-0 Monocryl.  The wounds were then cleaned, dried and dressed sterilely using Dermabond and Aquacel dressing.  She was then brought to the recovery room in stable condition tolerating the procedure well.  She will be weightbearing as tolerated.  We will see her  back in the office in approximately 2-3 weeks for wound evaluation.  Discharge plan will be based on her progress through using physical therapy.     Madlyn Frankel Charlann Boxer, M.D.     MDO/MEDQ  D:  11/28/2012  T:  11/29/2012  Job:  [id number]

## 2012-11-29 NOTE — Progress Notes (Signed)
Went in patients room to assess O2sat and I smelled smoke like a cigarette in room.  Patient was desating and her O2 nasal cannula was off.  Visitor was present in the room with her.  As I was walking in the room, patient told visitor to get the air freshener and he rushed off into the bathroom.  I told the patient that I smelled smoke coming from inside the room and she explained that she has a Radiographer, therapeutic.  Patient was reinforced to the hospital policy about no smoking.  We offered to get a nicotine patch ordered and she agreed.    MD was text paged and note left in chart.  Student nurse, Leota Jacobsen

## 2012-11-29 NOTE — Progress Notes (Signed)
Patient ID: Emily Moran, female   DOB: 24-Nov-1955, 57 y.o.   MRN: [id number]  [facility] PROGRESS NOTE  Emily Moran ION:[id number] DOB: 1956/04/26 DOA: 11/28/2012 PCP: No primary provider on file.  Brief narrative: With h/o of COPD on home oxygen and DJD. Was walking dog when he decided to chase a cat jerked the leash and patient subsequently fell. Due to persistent pain and worsening pain with weight bearing patient was brought to the ED for further evaluation and recommendations.  Initial evaluation showed a right intertrochanteric femur fracture in ED. Orthopaedic surgery was called and patient take to surgery for ORIF. I was paged by orthopaedic surgeon after procedure for admission orders and recommendations for patient's underlying COPD.   Active Problems: Right femoral intertrochanteric fracture.    - status post ORIF by Dr. Charlann Boxer - pt doing well - continue analgesia as needed - PT Acute blood loss anemia - post op related - monitor CBC closely  Consultants:  Ortho  Procedures/Studies: Dg Hip Complete Right 11/28/2012 Right femoral intertrochanteric fracture.    Dg Hip Operative Right 11/28/2012  Intraoperative documentation of gamma nail fixation for intertrochanteric right hip fracture, as above.   Dg Femur Right 11/28/2012 Right femoral intertrochanteric fracture.    Dg Chest Port 1 View 11/28/2012  Emphysema without acute cardiopulmonary disease.  Severe bullous emphysema of the right upper lobe.   Antibiotics:  None  Code Status: Full Family Communication: Pt at bedside Disposition Plan: Home when medically stable  HPI/Subjective: No events overnight.   Objective: Filed Vitals:   11/29/12 0000 11/29/12 0207 11/29/12 0347 11/29/12 0629  BP:  112/74  105/68  Pulse:  73  77  Temp:  98.5 F (36.9 C)  98.9 F (37.2 C)  TempSrc:  Oral  Oral  Resp: 16 16 16 16   Height:      Weight:      SpO2: 94% 92% 98% 98%    Intake/Output Summary (Last 24  hours) at 11/29/12 0749 Last data filed at 11/29/12 0744  Gross per 24 hour  Intake 2165.75 ml  Output   2570 ml  Net -404.25 ml    Exam:   General:  Pt is alert, follows commands appropriately, not in acute distress  Cardiovascular: Regular rate and rhythm, S1/S2, no murmurs, no rubs, no gallops  Respiratory: Clear to auscultation bilaterally, no wheezing, no crackles, no rhonchi  Abdomen: Soft, non tender, non distended, bowel sounds present, no guarding  Extremities: No edema, pulses DP and PT palpable bilaterally  Neuro: Grossly nonfocal  Data Reviewed: Basic Metabolic Panel:  Recent Labs Lab 11/28/12 0950 11/29/12 0503  NA 136 138  K 3.6 3.6  CL 102 109  CO2 22 23  GLUCOSE 92 96  BUN 5* 5*  CREATININE 0.68 0.75  CALCIUM 9.3 8.0*   CBC:  Recent Labs Lab 11/28/12 0950 11/29/12 0503  WBC 13.0* 9.1  HGB 13.2 9.7*  HCT 39.4 28.3*  MCV 91.6 93.7  PLT 270 191    Scheduled Meds: . docusate sodium  100 mg Oral BID  . enoxaparin (LOVENOX) injection  40 mg Subcutaneous Q24H  . influenza  inactive virus vaccine  0.5 mL Intramuscular Once   Continuous Infusions: . sodium chloride 0.9 % 1,000 mL with potassium chloride 10 mEq infusion 75 mL/hr at 11/28/12 2105     Debbora Presto, MD  [facility] Pager [phone]  If 7PM-7AM, please contact night-coverage [email or web address] Password [facility] 11/29/2012, 7:49 AM   LOS: 1 day

## 2012-11-29 NOTE — Progress Notes (Signed)
Subjective: 1 Day Post-Op Procedure(s) (LRB): INTRAMEDULLARY (IM) RETROGRADE FEMORAL NAILING wants jackson table , c-arm and biomet  (Right) Patient reports pain as 7 on 0-10 scale. Good function in Foot. Hbg 9.7   Objective: Vital signs in last 24 hours: Temp:  [98.5 F (36.9 C)-100.1 F (37.8 C)] 98.9 F (37.2 C) (02/22 0629) Pulse Rate:  [68-81] 77 (02/22 0629) Resp:  [12-18] 16 (02/22 0629) BP: (105-162)/(61-92) 105/68 mmHg (02/22 0629) SpO2:  [92 %-99 %] 98 % (02/22 0629)  Intake/Output from previous day: 02/21 0701 - 02/22 0700 In: 2165.8 [P.O.:622; I.V.:1543.8] Out: 2370 [Urine:2270; Blood:100] Intake/Output this shift: Total I/O In: -  Out: 200 [Urine:200]   Recent Labs  11/28/12 0950 11/29/12 0503  HGB 13.2 9.7*    Recent Labs  11/28/12 0950 11/29/12 0503  WBC 13.0* 9.1  RBC 4.30 3.02*  HCT 39.4 28.3*  PLT 270 191    Recent Labs  11/28/12 0950 11/29/12 0503  NA 136 138  K 3.6 3.6  CL 102 109  CO2 22 23  BUN 5* 5*  CREATININE 0.68 0.75  GLUCOSE 92 96  CALCIUM 9.3 8.0*   No results found for this basename: LABPT, INR,  in the last 72 hours  Neurologically intact Dorsiflexion/Plantar flexion intact  Assessment/Plan: 1 Day Post-Op Procedure(s) (LRB): INTRAMEDULLARY (IM) RETROGRADE FEMORAL NAILING wants jackson table , c-arm and biomet  (Right) Up with therapy  Jayley Hustead A 11/29/2012, 8:30 AM

## 2012-11-30 LAB — CBC
Hemoglobin: 8.4 g/dL — ABNORMAL LOW (ref 12.0–15.0)
MCH: 30.1 pg (ref 26.0–34.0)
RBC: 2.79 MIL/uL — ABNORMAL LOW (ref 3.87–5.11)

## 2012-11-30 LAB — BASIC METABOLIC PANEL
CO2: 25 mEq/L (ref 19–32)
Glucose, Bld: 94 mg/dL (ref 70–99)
Potassium: 3.5 mEq/L (ref 3.5–5.1)
Sodium: 137 mEq/L (ref 135–145)

## 2012-11-30 LAB — IRON AND TIBC
Saturation Ratios: 13 % — ABNORMAL LOW (ref 20–55)
TIBC: 230 ug/dL — ABNORMAL LOW (ref 250–470)
UIBC: 201 ug/dL (ref 125–400)

## 2012-11-30 LAB — FERRITIN: Ferritin: 120 ng/mL (ref 10–291)

## 2012-11-30 LAB — RETICULOCYTES: Retic Ct Pct: 1.4 % (ref 0.4–3.1)

## 2012-11-30 MED ORDER — HYDROMORPHONE HCL PF 2 MG/ML IJ SOLN
2.0000 mg | INTRAMUSCULAR | Status: DC | PRN
  Administered 2012-11-30: 3 mg via INTRAVENOUS
  Administered 2012-11-30: 2 mg via INTRAVENOUS
  Administered 2012-11-30: 3 mg via INTRAVENOUS
  Administered 2012-11-30 – 2012-12-01 (×6): 2 mg via INTRAVENOUS
  Filled 2012-11-30 (×3): qty 1
  Filled 2012-11-30: qty 2
  Filled 2012-11-30 (×3): qty 1
  Filled 2012-11-30: qty 2
  Filled 2012-11-30: qty 1

## 2012-11-30 MED ORDER — OXYCODONE HCL 5 MG PO TABS
5.0000 mg | ORAL_TABLET | ORAL | Status: DC | PRN
  Administered 2012-11-30 – 2012-12-01 (×5): 10 mg via ORAL
  Filled 2012-11-30 (×5): qty 2

## 2012-11-30 MED ORDER — ENSURE COMPLETE PO LIQD
237.0000 mL | Freq: Two times a day (BID) | ORAL | Status: DC
  Administered 2012-11-30 – 2012-12-01 (×3): 237 mL via ORAL

## 2012-11-30 MED ORDER — OXYCODONE HCL ER 20 MG PO T12A
20.0000 mg | EXTENDED_RELEASE_TABLET | Freq: Two times a day (BID) | ORAL | Status: DC
  Administered 2012-11-30 – 2012-12-01 (×3): 20 mg via ORAL
  Filled 2012-11-30 (×3): qty 1

## 2012-11-30 MED ORDER — ZOLPIDEM TARTRATE 10 MG PO TABS
5.0000 mg | ORAL_TABLET | Freq: Once | ORAL | Status: AC
  Administered 2012-11-30: 5 mg via ORAL
  Filled 2012-11-30 (×2): qty 1

## 2012-11-30 NOTE — Progress Notes (Signed)
Subjective: 2 Days Post-Op Procedure(s) (LRB): INTRAMEDULLARY (IM) RETROGRADE FEMORAL NAILING wants jackson table , c-arm and biomet  (Right) Patient reports pain as mild.    Objective: Vital signs in last 24 hours: Temp:  [97.9 F (36.6 C)-98.8 F (37.1 C)] 98.1 F (36.7 C) (02/23 0530) Pulse Rate:  [70-87] 80 (02/23 0530) Resp:  [14-16] 16 (02/23 0530) BP: (104-138)/(69-87) 137/83 mmHg (02/23 0530) SpO2:  [92 %-98 %] 98 % (02/23 0530)  Intake/Output from previous day: 02/22 0701 - 02/23 0700 In: 2868.7 [P.O.:840; I.V.:2028.7] Out: 1101 [Urine:1101] Intake/Output this shift:     Recent Labs  11/28/12 0950 11/29/12 0503 11/30/12 0502  HGB 13.2 9.7* 8.4*    Recent Labs  11/29/12 0503 11/30/12 0502  WBC 9.1 7.4  RBC 3.02* 2.79*  HCT 28.3* 25.9*  PLT 191 192    Recent Labs  11/29/12 0503 11/30/12 0502  NA 138 137  K 3.6 3.5  CL 109 105  CO2 23 25  BUN 5* 4*  CREATININE 0.75 0.66  GLUCOSE 96 94  CALCIUM 8.0* 8.4   No results found for this basename: LABPT, INR,  in the last 72 hours  R thigh and hip wounds dressed and dry.  NVI at R LE.  Assessment/Plan: 2 Days Post-Op Procedure(s) (LRB): INTRAMEDULLARY (IM) RETROGRADE FEMORAL NAILING wants jackson table , c-arm and biomet  (Right) Up with therapy  Britanee Vanblarcom 11/30/2012, 9:02 AM

## 2012-11-30 NOTE — Progress Notes (Signed)
Physical Therapy Treatment Patient Details Name: Emily Moran MRN: [id number] DOB: 11/29/1955 Today's Date: 11/30/2012 Time: 4098-1191 PT Time Calculation (min): 25 min  PT Assessment / Plan / Recommendation Comments on Treatment Session  Pt progressing with all mobility, however continues to have high pain that is ongoing.  Pt premedicated prior to session.     Follow Up Recommendations  SNF;Supervision/Assistance - 24 hour     Does the patient have the potential to tolerate intense rehabilitation     Barriers to Discharge        Equipment Recommendations  None recommended by PT    Recommendations for Other Services OT consult  Frequency 7X/week   Plan Discharge plan needs to be updated    Precautions / Restrictions Precautions Precautions: Fall Restrictions Weight Bearing Restrictions: No Other Position/Activity Restrictions: WBAT   Pertinent Vitals/Pain 9/10, premedicated, ice pack applied    Mobility  Bed Mobility Bed Mobility: Supine to Sit Supine to Sit: 4: Min assist;HOB elevated;With rails Details for Bed Mobility Assistance: Assist for RLE out of bed with min cues for hand placement to self assist.   Transfers Transfers: Sit to Stand;Stand to Sit Sit to Stand: 4: Min assist;From elevated surface;From bed Stand to Sit: 4: Min assist;With upper extremity assist;With armrests;To chair/3-in-1 Details for Transfer Assistance: Assist to rise, steady and ensure controlled descent with cues for hand placement and LE management.  Ambulation/Gait Ambulation/Gait Assistance: 4: Min assist;3: Mod assist Ambulation Distance (Feet): 75 Feet Assistive device: Rolling walker Ambulation/Gait Assistance Details: Max cues for sequencing/technique with RW, upright posture, increasing UE WB for decreased antalgic gait pattern.  Also cues for increasing WB through RLE due to pt "hopping" initially.  Gait Pattern: Step-to pattern;Decreased step length - right;Decreased step length  - left;Decreased stance time - right Gait velocity: decreased    Exercises Total Joint Exercises Ankle Circles/Pumps: AROM;Both;20 reps Quad Sets: AROM;Both;10 reps Heel Slides: AAROM;Right;10 reps Hip ABduction/ADduction: AAROM;Right;10 reps   PT Diagnosis:    PT Problem List:   PT Treatment Interventions:     PT Goals Acute Rehab PT Goals PT Goal Formulation: With patient Time For Goal Achievement: 12/05/12 Potential to Achieve Goals: Good Pt will go Supine/Side to Sit: with modified independence PT Goal: Supine/Side to Sit - Progress: Progressing toward goal Pt will go Sit to Stand: with modified independence PT Goal: Sit to Stand - Progress: Progressing toward goal Pt will go Stand to Sit: with modified independence PT Goal: Stand to Sit - Progress: Progressing toward goal Pt will Ambulate: 51 - 150 feet;with modified independence;with rolling walker;with standard walker PT Goal: Ambulate - Progress: Progressing toward goal  Visit Information  Last PT Received On: 11/30/12 Assistance Needed: +1    Subjective Data  Subjective: I know I need to do this.  I just haven't slept in three days.  Patient Stated Goal: Resume previous lifestyle asap   Cognition  Cognition Overall Cognitive Status: Appears within functional limits for tasks assessed/performed Arousal/Alertness: Awake/alert Orientation Level: Appears intact for tasks assessed Behavior During Session: [facility] for tasks performed    Balance     End of Session PT - End of Session Activity Tolerance: Patient limited by pain Patient left: in chair;with call bell/phone within reach Nurse Communication: Mobility status   GP     Vista Deck 11/30/2012, 10:59 AM

## 2012-11-30 NOTE — Progress Notes (Signed)
Noted pts DC plan changed to SNF and pt in significant pain today.  Will defer OT eval to SNF Thanks, [employer]

## 2012-11-30 NOTE — Progress Notes (Signed)
Patient ID: Emily Moran, female   DOB: [birth year], 57 y.o.   MRN: [id number]  [facility] PROGRESS NOTE  Emily Moran LKG:[id number] DOB: 1956-09-04 DOA: 2012/12/14 PCP: No primary provider on file.  Brief narrative:  With h/o of COPD on home oxygen and DJD. Was walking dog when he decided to chase a cat jerked the leash and patient subsequently fell. Due to persistent pain and worsening pain with weight bearing patient was brought to the ED for further evaluation and recommendations.  Initial evaluation showed a right intertrochanteric femur fracture in ED. Orthopaedic surgery was called and patient take to surgery for ORIF. I was paged by orthopaedic surgeon after procedure for admission orders and recommendations for patient's underlying COPD.   Active Problems:  Right femoral intertrochanteric fracture.  - status post ORIF by Dr. Charlann Boxer  - pt doing well  - continue analgesia as needed  - SNF placement in progress  Acute blood loss anemia  - post op related, further drop since yesterday - will check FOBT, anemia panel - monitor CBC closely   Consultants:  Ortho Procedures/Studies:  Dg Hip Complete Right  12/14/2012 Right femoral intertrochanteric fracture.  Dg Hip Operative Right  12/14/2012 Intraoperative documentation of gamma nail fixation for intertrochanteric right hip fracture, as above.  Dg Femur Right  2012/12/14 Right femoral intertrochanteric fracture.  Dg Chest Port 1 View  December 14, 2012 Emphysema without acute cardiopulmonary disease. Severe bullous emphysema of the right upper lobe.  Antibiotics:  None Code Status: Full  Family Communication: Pt at bedside  Disposition Plan: Home when medically stable   HPI/Subjective: No events overnight.   Objective: Filed Vitals:   11/29/12 1421 11/29/12 1850 11/29/12 2113 11/30/12 0530  BP: 117/74 133/87 138/85 137/83  Pulse: 87 70 75 80  Temp: 97.9 F (36.6 C) 98.2 F (36.8 C) 98.2 F (36.8 C) 98.1 F (36.7 C)   TempSrc: Oral Oral Oral Oral  Resp: 16 14 16 16   Height:      Weight:      SpO2: 92% 97% 97% 98%    Intake/Output Summary (Last 24 hours) at 11/30/12 1011 Last data filed at 11/30/12 0820  Gross per 24 hour  Intake 2748.67 ml  Output    901 ml  Net 1847.67 ml    Exam:   General:  Pt is alert, follows commands appropriately, not in acute distress  Cardiovascular: Regular rate and rhythm, S1/S2, no murmurs, no rubs, no gallops  Respiratory: Clear to auscultation bilaterally, no wheezing, no crackles, no rhonchi  Abdomen: Soft, non tender, non distended, bowel sounds present, no guarding  Extremities: No edema, pulses DP and PT palpable bilaterally  Neuro: Grossly nonfocal  Data Reviewed: Basic Metabolic Panel:  Recent Labs Lab 14-Dec-2012 0950 11/29/12 0503 11/30/12 0502  NA 136 138 137  K 3.6 3.6 3.5  CL 102 109 105  CO2 22 23 25   GLUCOSE 92 96 94  BUN 5* 5* 4*  CREATININE 0.68 0.75 0.66  CALCIUM 9.3 8.0* 8.4   CBC:  Recent Labs Lab 12/14/12 0950 11/29/12 0503 11/30/12 0502  WBC 13.0* 9.1 7.4  HGB 13.2 9.7* 8.4*  HCT 39.4 28.3* 25.9*  MCV 91.6 93.7 92.8  PLT 270 191 192    Scheduled Meds: . docusate sodium  100 mg Oral BID  . enoxaparin (LOVENOX) injection  40 mg Subcutaneous Q24H   Continuous Infusions: . sodium chloride 0.9 % 1,000 mL with potassium chloride 10 mEq infusion 20 mL/hr at 11/30/12 0200  Debbora Presto, MD  [facility] Pager [phone]  If 7PM-7AM, please contact night-coverage [email or web address] Password [facility] 11/30/2012, 10:11 AM   LOS: 2 days

## 2012-11-30 NOTE — Progress Notes (Signed)
UR completed 

## 2012-11-30 NOTE — Progress Notes (Signed)
Clinical Social Work Department BRIEF PSYCHOSOCIAL ASSESSMENT 11/30/2012  Patient:  Emily Moran, Emily Moran     Account Number:  [id number]     Admit date:  11/28/2012  Clinical Social Worker:  Doroteo Glassman  Date/Time:  11/30/2012 08:59 AM  Referred by:  Physician  Date Referred:  11/30/2012 Referred for  SNF Placement   Other Referral:   Interview type:  Patient Other interview type:    PSYCHOSOCIAL DATA Living Status:  ALONE Admitted from facility:   Level of care:   Primary support name:  [place] Primary support relationship to patient:  CHILD, ADULT Degree of support available:   adequate    CURRENT CONCERNS Current Concerns  Post-Acute Placement   Other Concerns:    SOCIAL WORK ASSESSMENT / PLAN Met with Pt to discuss d/c plans.    Pt reported that she's been on the fence about her d/c plans but stated that she is now feeling like SNF is the best options.  Pt reported that she live close to [facility] and would prefer to go there, however she understands that there's a chance that [facility] may not have a bed.  Pt gave CSW permission to send her information to all [employer].    CSW thanked Pt for her time.   Assessment/plan status:  Psychosocial Support/Ongoing Assessment of Needs Other assessment/ plan:   Information/referral to community resources:   Will give with offers    PATIENT'S/FAMILY'S RESPONSE TO PLAN OF CARE: Pt thanked CSW for time and assistance.   CSW to continue to follow.  Providence Crosby, LCSWA Clinical Social Work [phone]

## 2012-11-30 NOTE — Progress Notes (Signed)
INITIAL NUTRITION ASSESSMENT  DOCUMENTATION CODES Per approved criteria  -Non-severe (moderate) malnutrition in the context of chronic illness   INTERVENTION: Ensure Complete po BID, each supplement provides 350 kcal and 13 grams of protein. Will provide patient with coupons for Ensure.  Also discussed use of Carnation Instant Breakfast to supplement intake of meals.  NUTRITION DIAGNOSIS: Mild-moderate malnutrition related to financial causes and COPD as evidenced by decreased body fat and muscle mass as well as intake of <75% for the past month..   Goal: Patient to meet estimated needs with >90% intake of meals and supplements.  Monitor:  Intake, labs, weight trend  Reason for Assessment: Consult-hip protocol  57 y.o. female  Admitting Dx: Right intertrochanteric femur fx - s/p surgery, post op day 2  ASSESSMENT: Pt with hx of COPD on home oxygen.  Continued to smoke up to a half pack per day prior to admit.  Patient now states a desire to stop smoking and verbalized her poor health.  Patient reports that she was not eating as well recently due to financial reasons.  Did not have food stamps for the past 3 months.  Family is now assisting patient to reapply.  Patient with recent 5% weight loss.  Discussed increased nutrient needs with COPD and hip fx.  Ate 100% breakfast this morning.  Height: Ht Readings from Last 1 Encounters:  11/28/12 5\' 7"  (1.702 m)    Weight: Wt Readings from Last 1 Encounters:  11/28/12 125 lb (56.7 kg)    Ideal Body Weight: 135 lbs % Ideal Body Weight: 93   Wt Readings from Last 10 Encounters:  11/28/12 125 lb (56.7 kg)  11/28/12 125 lb (56.7 kg)  05/14/11 131 lb 4.8 oz (59.557 kg)  12/17/07 133 lb 0.8 oz (60.351 kg)  09/11/07 121 lb 0.8 oz (54.908 kg)    Usual Body Weight: 130 lbs  % Usual Body Weight: 95  BMI:  Body mass index is 19.57 kg/(m^2).  Estimated Nutritional Needs: Kcal: 1700-1900 Protein: 75-85 gm Fluid:  1.7-1.8L  Skin: surgical wound  Diet Order: General  EDUCATION NEEDS: -Education needs addressed   Intake/Output Summary (Last 24 hours) at 11/30/12 0753 Last data filed at 11/30/12 0556  Gross per 24 hour  Intake 2868.67 ml  Output    901 ml  Net 1967.67 ml    Labs:   Recent Labs Lab 11/28/12 0950 11/29/12 0503 11/30/12 0502  NA 136 138 137  K 3.6 3.6 3.5  CL 102 109 105  CO2 22 23 25   BUN 5* 5* 4*  CREATININE 0.68 0.75 0.66  CALCIUM 9.3 8.0* 8.4  GLUCOSE 92 96 94    CBG (last 3)  No results found for this basename: GLUCAP,  in the last 72 hours  Scheduled Meds: . docusate sodium  100 mg Oral BID  . enoxaparin (LOVENOX) injection  40 mg Subcutaneous Q24H    Continuous Infusions: . sodium chloride 0.9 % 1,000 mL with potassium chloride 10 mEq infusion 20 mL/hr at 11/30/12 0200    Past Medical History  Diagnosis Date  . COPD (chronic obstructive pulmonary disease)   . Degenerative disc disease   . Spinal stenosis   . DDD (degenerative disc disease)   . DDD (degenerative disc disease)   . Hypertension   . Degenerative disc disease     Past Surgical History  Procedure Laterality Date  . Neck surgery    . Abdominal hysterectomy    . Bladder surgery  Antonieta Iba, RD, LDN Clinical Inpatient Dietitian Pager:  [phone] Weekend and after hours pager:  [phone]

## 2012-12-01 LAB — CBC
MCH: 30.5 pg (ref 26.0–34.0)
MCHC: 33.2 g/dL (ref 30.0–36.0)
MCV: 91.9 fL (ref 78.0–100.0)
Platelets: 198 10*3/uL (ref 150–400)
RBC: 2.85 MIL/uL — ABNORMAL LOW (ref 3.87–5.11)
RDW: 13.6 % (ref 11.5–15.5)

## 2012-12-01 LAB — BASIC METABOLIC PANEL
CO2: 28 mEq/L (ref 19–32)
Calcium: 8.8 mg/dL (ref 8.4–10.5)
Creatinine, Ser: 0.69 mg/dL (ref 0.50–1.10)

## 2012-12-01 MED ORDER — HYDROMORPHONE HCL 4 MG PO TABS: 4.0000 mg | ORAL_TABLET | ORAL | Status: DC | PRN

## 2012-12-01 MED ORDER — OXYCODONE HCL 5 MG PO TABS: 15.0000 mg | ORAL_TABLET | ORAL | Status: DC | PRN

## 2012-12-01 MED ORDER — OXYCODONE HCL ER 20 MG PO T12A: 20.0000 mg | EXTENDED_RELEASE_TABLET | Freq: Two times a day (BID) | ORAL | Status: DC

## 2012-12-01 MED ORDER — DSS 100 MG PO CAPS: 100.0000 mg | ORAL_CAPSULE | Freq: Two times a day (BID) | ORAL | Status: DC

## 2012-12-01 MED ORDER — ENOXAPARIN SODIUM 40 MG/0.4ML ~~LOC~~ SOLN: 40.0000 mg | SUBCUTANEOUS | Status: DC

## 2012-12-01 NOTE — Care Management Note (Signed)
    Page 1 of 1   12/01/2012     3:06:29 PM   CARE MANAGEMENT NOTE 12/01/2012  Patient:  Emily Moran, Emily Moran   Account Number:  [id number]  Date Initiated:  12/01/2012  Documentation initiated by:  Colleen Can  Subjective/Objective Assessment:   dx rt femoral intertrocahnteric fracture; ORIF     Action/Plan:   Pt plans SNF rehab   Anticipated DC Date:  12/01/2012   Anticipated DC Plan:  SKILLED NURSING FACILITY  In-house referral  Clinical Social Worker      DC Planning Services  CM consult      Choice offered to / List presented to:             Status of service:  Completed, signed off Medicare Important Message given?  NA - LOS <3 / Initial given by admissions (If response is "NO", the following Medicare IM given date fields will be blank) Date Medicare IM given:   Date Additional Medicare IM given:    Discharge Disposition:  SKILLED NURSING FACILITY  Per UR Regulation:    If discussed at Long Length of Stay Meetings, dates discussed:    Comments:

## 2012-12-01 NOTE — Progress Notes (Signed)
Patient ID: Emily Moran, female   DOB: 1956/03/10, 57 y.o.   MRN: [id number] Subjective: 3 Days Post-Op Procedure(s) (LRB):  ORIF right intertrochanteric femur fracture    Patient reports pain as moderate.  Has history of chronic pain.  Having difficult time with increased pain from surgery  Objective:   VITALS:   Filed Vitals:   12/01/12 0756  BP:   Pulse:   Temp:   Resp: 16    Neurovascular intact Incision: dressing C/D/I  LABS  Recent Labs  11/29/12 0503 11/30/12 0502 12/01/12 0416  HGB 9.7* 8.4* 8.7*  HCT 28.3* 25.9* 26.2*  WBC 9.1 7.4 8.1  PLT 191 192 198     Recent Labs  11/29/12 0503 11/30/12 0502 12/01/12 0416  NA 138 137 138  K 3.6 3.5 3.7  BUN 5* 4* 7  CREATININE 0.75 0.66 0.69  GLUCOSE 96 94 118*    No results found for this basename: LABPT, INR,  in the last 72 hours   Assessment/Plan: 3 Days Post-Op Procedure(s) (LRB): INTRAMEDULLARY (IM) RETROGRADE FEMORAL NAILING wants jackson table , c-arm and biomet  (Right)   Up with therapy Discharge to SNF when ready  Discharge plan: RTC in 2 weeks with Blayre Papania WBAT RLE Maintain right hip dressings, may shower with current dressings in place Lovenox 40mg  SQ for 2 weeks

## 2012-12-01 NOTE — Discharge Summary (Addendum)
Physician Discharge Summary  Emily Moran ZOX:[id number] DOB: [birth year] DOA: Dec 18, 2012  PCP: No primary provider on file.  Admit date: 12-18-2012 Discharge date: 12/01/2012  Recommendations for Outpatient Follow-up:  1. Pt will need to follow up with PCP in 2-3 weeks post discharge 2. Please obtain BMP to evaluate electrolytes and kidney function 3. Please also check CBC to evaluate Hg and Hct levels 4. Pt will need to follow up with Dr. Charlann Boxer in 2-3 weeks post discharge 5. Pt was discharged on Lovenox 40 mg inj subQ daily for 14 days   Discharge Diagnoses: Right femoral intertrochanteric fracutre  Active Problems:   Acute blood loss anemia  Discharge Condition: Stable  Diet recommendation: Heart healthy diet discussed in details   Brief narrative:  With h/o of COPD on home oxygen and DJD. Was walking dog when he decided to chase a cat jerked the leash and patient subsequently fell. Due to persistent pain and worsening pain with weight bearing patient was brought to the ED for further evaluation and recommendations.  Initial evaluation showed a right intertrochanteric femur fracture in ED. Orthopaedic surgery was called and patient take to surgery for ORIF. I was paged by orthopaedic surgeon after procedure for admission orders and recommendations for patient's underlying COPD.   Active Problems:  Right femoral intertrochanteric fracture.  - status post ORIF by Dr. Charlann Boxer  - pt doing well  - continue analgesia as needed  - SNF placement in progress  Acute blood loss anemia  - post op related, stable hg and Hct since yesterday  - FOBT negative, anemia panel pending Chronic respiratory failure secondary to COPD  - smoking cessation consultation provided  Moderate malnutrition in the setting of COPD progressive disease  - nutrition recommendations provided while in hospital   Consultants:  Ortho Procedures/Studies:  Dg Hip Complete Right  2012/12/18 Right femoral  intertrochanteric fracture.  Dg Hip Operative Right  12/18/2012 Intraoperative documentation of gamma nail fixation for intertrochanteric right hip fracture, as above.  Dg Femur Right  12/18/12 Right femoral intertrochanteric fracture.  Dg Chest Port 1 View  December 18, 2012 Emphysema without acute cardiopulmonary disease. Severe bullous emphysema of the right upper lobe.  Antibiotics:  None  Discharge Exam: Filed Vitals:   12/01/12 0756  BP:   Pulse:   Temp:   Resp: 16   Filed Vitals:   11/30/12 2134 11/30/12 2146 12/01/12 0500 12/01/12 0756  BP: 117/75 121/69 134/87   Pulse: 82 98 77   Temp: 98.4 F (36.9 C) 98.6 F (37 C) 98.3 F (36.8 C)   TempSrc: Oral Oral Oral   Resp: 16 16 16 16   Height:      Weight:      SpO2: 91% 97% 94%     General: Pt is alert, follows commands appropriately, not in acute distress Cardiovascular: Regular rate and rhythm, S1/S2 +, no murmurs, no rubs, no gallops Respiratory: Clear to auscultation bilaterally, no wheezing, no crackles, no rhonchi Abdominal: Soft, non tender, non distended, bowel sounds +, no guarding Extremities: no edema, no cyanosis, pulses palpable bilaterally DP and PT Neuro: Grossly nonfocal  Discharge Instructions  Discharge Orders   Future Orders Complete By Expires     Diet - low sodium heart healthy  As directed     Increase activity slowly  As directed         Medication List    STOP taking these medications       ibuprofen 200 MG tablet  Commonly known as:  ADVIL,MOTRIN      TAKE these medications       DSS 100 MG Caps  Take 100 mg by mouth 2 (two) times daily.     enoxaparin 40 MG/0.4ML injection  Commonly known as:  LOVENOX  Inject 0.4 mLs (40 mg total) into the skin daily.     HYDROmorphone 4 MG tablet  Commonly known as:  DILAUDID  Take 1 tablet (4 mg total) by mouth every 4 (four) hours as needed for pain (for refractoiry pain).     oxyCODONE 5 MG immediate release tablet  Commonly known as:   Oxy IR/ROXICODONE  Take 3-6 tablets (15-30 mg total) by mouth every 4 (four) hours as needed for pain.     OxyCODONE 20 mg T12a  Commonly known as:  OXYCONTIN  Take 1 tablet (20 mg total) by mouth every 12 (twelve) hours.     PROVENTIL HFA 108 (90 BASE) MCG/ACT inhaler  Generic drug:  albuterol  Inhale 2 puffs into the lungs every 6 (six) hours as needed. For shortness of breath           Follow-up Information   Follow up with Shelda Pal, MD In 3 weeks.   Contact information:   [address] [place] [address] [phone]        The results of significant diagnostics from this hospitalization (including imaging, microbiology, ancillary and laboratory) are listed below for reference.     Microbiology: No results found for this or any previous visit (from the past 240 hour(s)).   Labs: Basic Metabolic Panel:  Recent Labs Lab 11/28/12 0950 11/29/12 0503 11/30/12 0502 12/01/12 0416  NA 136 138 137 138  K 3.6 3.6 3.5 3.7  CL 102 109 105 104  CO2 22 23 25 28   GLUCOSE 92 96 94 118*  BUN 5* 5* 4* 7  CREATININE 0.68 0.75 0.66 0.69  CALCIUM 9.3 8.0* 8.4 8.8  CBC:  Recent Labs Lab 11/28/12 0950 11/29/12 0503 11/30/12 0502 12/01/12 0416  WBC 13.0* 9.1 7.4 8.1  HGB 13.2 9.7* 8.4* 8.7*  HCT 39.4 28.3* 25.9* 26.2*  MCV 91.6 93.7 92.8 91.9  PLT 270 191 192 198    SIGNED: Time coordinating discharge: Over 30 minutes  Debbora Presto, MD  [facility] 12/01/2012, 10:39 AM Pager [phone]  If 7PM-7AM, please contact night-coverage [email or web address] Password [email or web address]

## 2012-12-01 NOTE — Clinical Documentation Improvement (Signed)
RESPIRATORY FAILURE DOCUMENTATION CLARIFICATION QUERY   THIS DOCUMENT IS NOT A PERMANENT PART OF THE MEDICAL RECORD  TO RESPOND TO THE THIS QUERY, FOLLOW THE INSTRUCTIONS BELOW:  1. If needed, update documentation for the patient's encounter via the notes activity.  2. Access this query again and click edit on the In [employer].  3. After updating, or not, click F2 to complete all highlighted (required) fields concerning your review. Select "additional documentation in the medical record" OR "no additional documentation provided".  4. Click Sign note button.  5. The deficiency will fall out of your In Basket *Please let us know if you are not able to complete this workflow by phone or e-mail (listed below).  Please update your documentation within the medical record to reflect your response to this query.                                                                                    12/01/12  Dear Dr. Izola Price, I Emily Moran,  In a better effort to capture your patient's severity of illness, reflect appropriate length of stay and utilization of resources, a review of the patient medical record has revealed the following indicators.    Based on your clinical judgment, please clarify and document in a progress note and/or discharge summary the clinical condition associated with the following supporting information:  In responding to this query please exercise your independent judgment.  The fact that a query is asked, does not imply that any particular answer is desired or expected.  According to H/P pt with COPD.  Clarification Needed    Please clarify if COPD in setting of continued supplemental oxygen and bronchodilators with CXR of severe emphysema to Right upper lobe can be further specified as one of the diagnoses listed below and document in pn or d/c summary    Possible Clinical Conditions? (Please document all that apply)   _______Acute on Chronic Respiratory  Failure _______Chronic Respiratory Failure _______Severe Emphysema  _______Acute Respiratory Insufficiency following surgery or trauma _______Other Condition________________ _______Cannot Clinically Determine    Supporting Information:  Risk Factors:  R intertrochanteric fracture, COPD, HTN, DDD, ABLA   Signs&Symptoms: HP COPD - Continue supplemental oxygen and bronchodilators  PN 12/01/12 With h/o of COPD on home oxygen   Diagnostics: : Lab:  Radiology: CXR 11/28/12 IMPRESSION: Emphysema without acute cardiopulmonary disease.  Severe bullous emphysema of the right upper lobe.  Treatment: Proventil  11/28/12 Mask @ 5L/per min   11/30/12 O2 @ 2L/per min/ [place]                   You may use possible, probable, or suspect with inpatient documentation. possible, probable, suspected diagnoses MUST be documented at the time of discharge  Reviewed: Agree with chronic exacerbation of COPD, continue nebulizers, smoking cessation discussed. Addressed in discharge summary. Thank you  Kristina Bertone  Thank You,  Enis Slipper  RN, BSN, MSN/Inf, CCDS Clinical Documentation Specialist [facility] Pager: [phone] / E-mail: [email or web address]   Health Information Management [place]

## 2012-12-01 NOTE — Progress Notes (Signed)
Clinical Social Work Department CLINICAL SOCIAL WORK PLACEMENT NOTE 12/01/2012  Patient:  Emily Moran, Emily Moran  Account Number:  [id number] Admit date:  11/28/2012  Clinical Social Worker:  Doroteo Glassman  Date/time:  11/30/2012 09:04 AM  Clinical Social Work is seeking post-discharge placement for this patient at the following level of care:   SKILLED NURSING   (*CSW will update this form in Epic as items are completed)     Patient/family provided with [facility] Department of Clinical Social Work's list of facilities offering this level of care within the geographic area requested by the patient (or if unable, by the patient's family).  11/30/2012  Patient/family informed of their freedom to choose among providers that offer the needed level of care, that participate in Medicare, Medicaid or managed care program needed by the patient, have an available bed and are willing to accept the patient.  11/30/2012  Patient/family informed of [facility]' ownership interest in [facility], as well as of the fact that they are under no obligation to receive care at this facility.  PASARR submitted to EDS on  PASARR number received from EDS on   FL2 transmitted to all facilities in geographic area requested by pt/family on  11/30/2012 FL2 transmitted to all facilities within larger geographic area on   Patient informed that his/her managed care company has contracts with or will negotiate with  certain facilities, including the following:     Patient/family informed of bed offers received:  12/01/2012 Patient chooses bed at [facility], [place] Physician recommends and patient chooses bed at    [facility] [place] on  12/01/2012 Patient to be transferred to facility by [facility]  The following physician request were entered in Epic:   Additional Comments:  Cori Razor LCSW [phone]

## 2012-12-01 NOTE — Progress Notes (Signed)
Physical Therapy Treatment Patient Details Name: CORVETTE ORSER MRN: [id number] DOB: [birth year] Today's Date: 12/01/2012 Time: 4696-2952 PT Time Calculation (min): 25 min  PT Assessment / Plan / Recommendation Comments on Treatment Session  Progressing with mobility. Pain level is still high, "8", but pt willing to participate. Recommend SNF for continued rehab.     Follow Up Recommendations  SNF;Supervision/Assistance - 24 hour     Does the patient have the potential to tolerate intense rehabilitation     Barriers to Discharge        Equipment Recommendations  None recommended by PT    Recommendations for Other Services    Frequency 7X/week   Plan Discharge plan remains appropriate    Precautions / Restrictions Precautions Precautions: Fall Restrictions Weight Bearing Restrictions: No RLE Weight Bearing: Weight bearing as tolerated   Pertinent Vitals/Pain 8/10 R hip    Mobility  Bed Mobility Bed Mobility: Supine to Sit Supine to Sit: 4: Min guard;HOB elevated;With rails Transfers Transfers: Sit to Stand;Stand to Sit Sit to Stand: From bed;4: Min guard;With upper extremity assist Stand to Sit: 4: Min assist;To chair/3-in-1;With upper extremity assist;With armrests Details for Transfer Assistance: Assist to control descent to chair. VCs safety, technique, hand placement.  Ambulation/Gait Ambulation/Gait Assistance: 4: Min assist Ambulation Distance (Feet): 78 Feet Assistive device: Rolling walker Ambulation/Gait Assistance Details: Assist to stabilize intermittently. Slow gait speed. VCS safety, technique, sequence. Pt not "hopping" as much but gait pattern is still antalgic.  Gait Pattern: Step-to pattern;Antalgic;Decreased stance time - right;Decreased step length - right;Decreased stride length    Exercises     PT Diagnosis:    PT Problem List:   PT Treatment Interventions:     PT Goals Acute Rehab PT Goals Pt will go Supine/Side to Sit: with modified  independence PT Goal: Supine/Side to Sit - Progress: Progressing toward goal Pt will go Sit to Stand: with modified independence PT Goal: Sit to Stand - Progress: Progressing toward goal Pt will go Stand to Sit: with modified independence PT Goal: Stand to Sit - Progress: Progressing toward goal Pt will Ambulate: 51 - 150 feet;with modified independence;with rolling walker PT Goal: Ambulate - Progress: Progressing toward goal  Visit Information  Last PT Received On: 12/01/12 Assistance Needed: +1    Subjective Data  Subjective: I wanna walk Patient Stated Goal: Resume previous lifestyle asap   Cognition  Cognition Overall Cognitive Status: Appears within functional limits for tasks assessed/performed Arousal/Alertness: Awake/alert Orientation Level: Appears intact for tasks assessed Behavior During Session: [facility] for tasks performed    Balance     End of Session PT - End of Session Activity Tolerance: Patient limited by pain Patient left: in chair;with call bell/phone within reach   GP     Rebeca Alert [facility] 12/01/2012, 11:55 AM [phone]
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
## 2012-12-01 NOTE — Progress Notes (Signed)
Pt to d/c to [facility]. Report given to [facility], the nurse who's going to take over care for pt. Pt in no distress. Left via [facility].

## 2012-12-03 ENCOUNTER — Encounter (HOSPITAL_COMMUNITY): Payer: Self-pay | Admitting: Orthopedic Surgery

## 2013-02-02 ENCOUNTER — Encounter (HOSPITAL_COMMUNITY): Payer: Self-pay | Admitting: Emergency Medicine

## 2013-02-02 ENCOUNTER — Emergency Department (HOSPITAL_COMMUNITY)
Admission: EM | Admit: 2013-02-02 | Discharge: 2013-02-02 | Discharge status: Against Medical Advice | Payer: Medicare Other | Attending: Emergency Medicine | Admitting: Emergency Medicine

## 2013-02-02 DIAGNOSIS — R1110 Vomiting, unspecified: Secondary | ICD-10-CM | POA: Insufficient documentation

## 2013-02-02 MED ORDER — ONDANSETRON 8 MG PO TBDP
8.0000 mg | ORAL_TABLET | Freq: Once | ORAL | Status: AC
  Administered 2013-02-02: 8 mg via ORAL
  Filled 2013-02-02: qty 1

## 2013-02-02 NOTE — ED Notes (Signed)
Pt complains of nausea and vomiting and back pain x 1 day. Pt states "I can't keep any of my pain medications down"

## 2013-03-31 ENCOUNTER — Encounter (HOSPITAL_COMMUNITY): Payer: Self-pay

## 2013-03-31 ENCOUNTER — Emergency Department (HOSPITAL_COMMUNITY)
Admission: EM | Admit: 2013-03-31 | Discharge: 2013-03-31 | Disposition: A | Discharge status: Home / Self Care | Payer: Medicare Other | Attending: Emergency Medicine | Admitting: Emergency Medicine

## 2013-03-31 DIAGNOSIS — Y9289 Other specified places as the place of occurrence of the external cause: Secondary | ICD-10-CM | POA: Insufficient documentation

## 2013-03-31 DIAGNOSIS — T22159A Burn of first degree of unspecified shoulder, initial encounter: Secondary | ICD-10-CM | POA: Insufficient documentation

## 2013-03-31 DIAGNOSIS — I10 Essential (primary) hypertension: Secondary | ICD-10-CM | POA: Insufficient documentation

## 2013-03-31 DIAGNOSIS — T2014XA Burn of first degree of nose (septum), initial encounter: Secondary | ICD-10-CM | POA: Insufficient documentation

## 2013-03-31 DIAGNOSIS — F17200 Nicotine dependence, unspecified, uncomplicated: Secondary | ICD-10-CM | POA: Insufficient documentation

## 2013-03-31 DIAGNOSIS — Z79899 Other long term (current) drug therapy: Secondary | ICD-10-CM | POA: Insufficient documentation

## 2013-03-31 DIAGNOSIS — T22151A Burn of first degree of right shoulder, initial encounter: Secondary | ICD-10-CM

## 2013-03-31 DIAGNOSIS — T23201A Burn of second degree of right hand, unspecified site, initial encounter: Secondary | ICD-10-CM

## 2013-03-31 DIAGNOSIS — T23109A Burn of first degree of unspecified hand, unspecified site, initial encounter: Secondary | ICD-10-CM | POA: Insufficient documentation

## 2013-03-31 DIAGNOSIS — J4489 Other specified chronic obstructive pulmonary disease: Secondary | ICD-10-CM | POA: Insufficient documentation

## 2013-03-31 DIAGNOSIS — T23101A Burn of first degree of right hand, unspecified site, initial encounter: Secondary | ICD-10-CM

## 2013-03-31 DIAGNOSIS — J449 Chronic obstructive pulmonary disease, unspecified: Secondary | ICD-10-CM | POA: Insufficient documentation

## 2013-03-31 DIAGNOSIS — Z8739 Personal history of other diseases of the musculoskeletal system and connective tissue: Secondary | ICD-10-CM | POA: Insufficient documentation

## 2013-03-31 DIAGNOSIS — Y9389 Activity, other specified: Secondary | ICD-10-CM | POA: Insufficient documentation

## 2013-03-31 DIAGNOSIS — T23209A Burn of second degree of unspecified hand, unspecified site, initial encounter: Secondary | ICD-10-CM | POA: Insufficient documentation

## 2013-03-31 DIAGNOSIS — X038XXA Other exposure to controlled fire, not in building or structure, initial encounter: Secondary | ICD-10-CM | POA: Insufficient documentation

## 2013-03-31 MED ORDER — KETOROLAC TROMETHAMINE 60 MG/2ML IM SOLN
60.0000 mg | Freq: Once | INTRAMUSCULAR | Status: AC
  Administered 2013-03-31: 60 mg via INTRAMUSCULAR
  Filled 2013-03-31: qty 2

## 2013-03-31 MED ORDER — SILVER SULFADIAZINE 1 % EX CREA
TOPICAL_CREAM | Freq: Once | CUTANEOUS | Status: AC
  Administered 2013-03-31: 13:00:00 via TOPICAL
  Filled 2013-03-31: qty 50
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
## 2013-04-01 NOTE — ED Provider Notes (Signed)
Medical screening examination/treatment/procedure(s) were performed by non-physician practitioner and as supervising physician I was immediately available for consultation/collaboration.    Mozel Burdett D Esteen Delpriore, MD 04/01/13 1736 

## 2013-06-15 ENCOUNTER — Other Ambulatory Visit: Payer: Self-pay | Admitting: Nurse Practitioner

## 2013-06-15 DIAGNOSIS — M545 Low back pain: Secondary | ICD-10-CM

## 2013-06-18 ENCOUNTER — Ambulatory Visit
Admission: RE | Admit: 2013-06-18 | Discharge: 2013-06-18 | Disposition: A | Discharge status: Home / Self Care | Payer: Medicare Other | Source: Ambulatory Visit | Attending: Nurse Practitioner | Admitting: Nurse Practitioner

## 2013-06-18 DIAGNOSIS — M545 Low back pain, unspecified: Secondary | ICD-10-CM

## 2013-06-28 ENCOUNTER — Emergency Department (HOSPITAL_COMMUNITY)
Admission: EM | Admit: 2013-06-28 | Discharge: 2013-06-28 | Disposition: A | Discharge status: Home / Self Care | Payer: Medicare Other | Attending: Emergency Medicine | Admitting: Emergency Medicine

## 2013-06-28 ENCOUNTER — Encounter (HOSPITAL_COMMUNITY): Payer: Self-pay | Admitting: *Deleted

## 2013-06-28 ENCOUNTER — Emergency Department (HOSPITAL_COMMUNITY): Payer: Medicare Other

## 2013-06-28 DIAGNOSIS — R05 Cough: Secondary | ICD-10-CM | POA: Insufficient documentation

## 2013-06-28 DIAGNOSIS — I10 Essential (primary) hypertension: Secondary | ICD-10-CM | POA: Insufficient documentation

## 2013-06-28 DIAGNOSIS — R509 Fever, unspecified: Secondary | ICD-10-CM | POA: Insufficient documentation

## 2013-06-28 DIAGNOSIS — F17200 Nicotine dependence, unspecified, uncomplicated: Secondary | ICD-10-CM | POA: Insufficient documentation

## 2013-06-28 DIAGNOSIS — J4489 Other specified chronic obstructive pulmonary disease: Secondary | ICD-10-CM | POA: Insufficient documentation

## 2013-06-28 DIAGNOSIS — Z79899 Other long term (current) drug therapy: Secondary | ICD-10-CM | POA: Insufficient documentation

## 2013-06-28 DIAGNOSIS — J40 Bronchitis, not specified as acute or chronic: Secondary | ICD-10-CM

## 2013-06-28 DIAGNOSIS — R51 Headache: Secondary | ICD-10-CM | POA: Insufficient documentation

## 2013-06-28 DIAGNOSIS — J209 Acute bronchitis, unspecified: Secondary | ICD-10-CM | POA: Insufficient documentation

## 2013-06-28 DIAGNOSIS — R062 Wheezing: Secondary | ICD-10-CM | POA: Insufficient documentation

## 2013-06-28 DIAGNOSIS — R059 Cough, unspecified: Secondary | ICD-10-CM | POA: Insufficient documentation

## 2013-06-28 DIAGNOSIS — Z8701 Personal history of pneumonia (recurrent): Secondary | ICD-10-CM | POA: Insufficient documentation

## 2013-06-28 DIAGNOSIS — IMO0002 Reserved for concepts with insufficient information to code with codable children: Secondary | ICD-10-CM | POA: Insufficient documentation

## 2013-06-28 DIAGNOSIS — J449 Chronic obstructive pulmonary disease, unspecified: Secondary | ICD-10-CM | POA: Insufficient documentation

## 2013-06-28 MED ORDER — PREDNISONE 20 MG PO TABS
60.0000 mg | ORAL_TABLET | Freq: Once | ORAL | Status: AC
  Administered 2013-06-28: 60 mg via ORAL
  Filled 2013-06-28: qty 3

## 2013-06-28 MED ORDER — AZITHROMYCIN 250 MG PO TABS: 250.0000 mg | ORAL_TABLET | Freq: Every day | ORAL | Status: DC

## 2013-06-28 MED ORDER — ALBUTEROL SULFATE HFA 108 (90 BASE) MCG/ACT IN AERS
2.0000 | INHALATION_SPRAY | RESPIRATORY_TRACT | Status: DC | PRN
  Administered 2013-06-28 (×2): 2 via RESPIRATORY_TRACT
  Filled 2013-06-28: qty 6.7

## 2013-06-28 MED ORDER — HYDROCODONE-ACETAMINOPHEN 5-325 MG PO TABS
1.0000 | ORAL_TABLET | Freq: Once | ORAL | Status: AC
  Administered 2013-06-28: 1 via ORAL
  Filled 2013-06-28: qty 1

## 2013-06-28 MED ORDER — AZITHROMYCIN 250 MG PO TABS
500.0000 mg | ORAL_TABLET | Freq: Once | ORAL | Status: AC
  Administered 2013-06-28: 500 mg via ORAL
  Filled 2013-06-28: qty 2

## 2013-06-28 MED ORDER — PREDNISONE 10 MG PO TABS: 20.0000 mg | ORAL_TABLET | Freq: Every day | ORAL | Status: DC

## 2013-06-28 NOTE — ED Notes (Signed)
Patient transported to X-ray 

## 2013-06-28 NOTE — ED Provider Notes (Signed)
CSN: [id number]     Arrival date & time 06/28/13  1906 History   First MD Initiated Contact with Patient 06/28/13 1917     Chief Complaint  Patient presents with  . Shortness of Breath  . Cough   (Consider location/radiation/quality/duration/timing/severity/associated sxs/prior Treatment) HPI  57 year old female with hx of COPD, DDD, and hypertension presents complaining of shortness of breath. Patient states since yesterday she has had worsening shortness of breath, nonproductive cough, headache, subjective fever and chills. The symptom is persistent. She noticed some mild wheezing. She described headaches as a throbbing sensation to her forehead. She felt her symptom is similar to prior bronchitis. She does have history of pneumonia in the past. There has been no recent hospitalization. She is a smoker and smoked one half pack a day. Otherwise patient denies runny nose, sneezing, sore throat, neck stiffness, chest pain, DOE, hemoptysis, nausea, vomiting, diarrhea, abdominal pain, back pain, or rash. Patient has tried Tylenol home without improvement. Patient also mentioned that she has history of degenerative disc disease, she had a ruptured disc on her back which was found on an MRI done 9 days ago. She is currently in awaits further treatment. Otherwise patient denies prior history of PE, DVT, no recent surgery, prolonged bed rest, taking exogenous hormone, having a leg swelling or calf pain. Denies history of cancer.  Past Medical History  Diagnosis Date  . COPD (chronic obstructive pulmonary disease)   . Degenerative disc disease   . Spinal stenosis   . DDD (degenerative disc disease)   . DDD (degenerative disc disease)   . Hypertension   . Degenerative disc disease    Past Surgical History  Procedure Laterality Date  . Neck surgery    . Abdominal hysterectomy    . Bladder surgery    . Femur im nail Right 11/28/2012    Procedure: INTRAMEDULLARY (IM) RETROGRADE FEMORAL NAILING wants  jackson table , c-arm and biomet ;  Surgeon: Shelda Pal, MD;  Location: [facility];  Service: Orthopedics;  Laterality: Right;   No family history on file. History  Substance Use Topics  . Smoking status: Current Every Day Smoker -- 0.50 packs/day    Types: Cigarettes  . Smokeless tobacco: Never Used  . Alcohol Use: No     Comment: History of ETOH abuse   OB History   Grav Para Term Preterm Abortions TAB SAB Ect Mult Living                 Review of Systems  All other systems reviewed and are negative.    Allergies  Aspirin  Home Medications   Current Outpatient Rx  Name  Route  Sig  Dispense  Refill  . acetaminophen (TYLENOL) 500 MG tablet   Oral   Take 1,000 mg by mouth every 6 (six) hours as needed for pain.         Marland Kitchen albuterol (PROVENTIL HFA) 108 (90 BASE) MCG/ACT inhaler   Inhalation   Inhale 2 puffs into the lungs every 6 (six) hours as needed. For shortness of breath         . DULoxetine (CYMBALTA) 60 MG capsule   Oral   Take 60 mg by mouth 2 (two) times daily.          BP 131/85  Pulse 96  Temp(Src) 99.1 F (37.3 C) (Oral)  Resp 20  SpO2 95% Physical Exam  Nursing note and vitals reviewed. Constitutional: She is oriented to person, place, and time. She  appears well-developed and well-nourished. No distress.  Awake, alert, nontoxic appearance  HENT:  Head: Atraumatic.  Eyes: Conjunctivae are normal. Right eye exhibits no discharge. Left eye exhibits no discharge.  Neck: Neck supple. No JVD present.  Cardiovascular: Normal rate and regular rhythm.   Pulmonary/Chest: Effort normal. No respiratory distress. She has wheezes. She exhibits no tenderness.  Mild respiratory wheezes, with scattered rhonchi, no obvious rales.    Abdominal: Soft. There is no tenderness. There is no rebound.  Musculoskeletal: She exhibits no edema and no tenderness.  ROM appears intact, no obvious focal weakness  BLE: no palpable cords, erythema, edema, neg Homan signs   Neurological: She is alert and oriented to person, place, and time.  Mental status and motor strength appears intact  Skin: No rash noted.  Psychiatric: She has a normal mood and affect.    ED Course  Procedures (including critical care time)  8:37 PM Pt with sxs suggestive of bronchitis.  CXR shows extensive emphysematous changes without airspace consolidation.  Will treat with albuterol, zithromax, prednisone  10:04 PM Pt felt ebtter after receiving treatment.  Ambulate maintaining normal O2.  Stable for discharge.  Return precaution discussed.   Labs Review Labs Reviewed - No data to display Imaging Review Dg Chest 2 View  06/28/2013   CLINICAL DATA:  Cough and shortness of breath; emphysema  EXAM: CHEST  2 VIEW  COMPARISON:  November 28, 2012  FINDINGS: There is extensive emphysema in the upper lobes, more pronounced on the right than on the left. The interstitium is diffusely prominent and aerated areas of lung, in part due to scarring and part due to redistribution of blood flow to a viable lung segments.  There is no airspace consolidation. Heart size is normal. Pulmonary vascularity reflects underlying emphysema. No adenopathy. Postoperative change is noted in the cervical spine.  IMPRESSION: Extensive emphysematous change. Interstitial prominence is due to a combination of scarring and redistribution of blood flow to a viable segments of lung. The appearance is essentially stable. There is not felt to be appreciable edema. There is no airspace consolidation.   Electronically Signed   By: Bretta Bang   On: 06/28/2013 19:56    MDM   1. Bronchitis    BP 131/85  Pulse 96  Temp(Src) 99.1 F (37.3 C) (Oral)  Resp 20  SpO2 95%  I have reviewed nursing notes and vital signs. I personally reviewed the imaging tests through PACS system  I reviewed available ER/hospitalization records thought the EMR     Fayrene Helper, [place] 06/28/13 2205

## 2013-06-28 NOTE — ED Notes (Signed)
Pt reports she developed a non-productive cough today, now c/o SOB/difficulty breathing. Hx COPD. Wears 2 L o2 at home. Also reports having recent MRI and has 2 ruptured discs, c/o back pain. States she has not taken anything for the pain, has to get a pcp first to have rx filled

## 2013-06-29 NOTE — ED Provider Notes (Signed)
Medical screening examination/treatment/procedure(s) were performed by non-physician practitioner and as supervising physician I was immediately available for consultation/collaboration.  Vinette Crites L Renlee Floor, MD 06/29/13 0048 

## 2013-12-30 ENCOUNTER — Inpatient Hospital Stay (HOSPITAL_COMMUNITY): Payer: Medicare Other

## 2013-12-30 ENCOUNTER — Encounter (HOSPITAL_COMMUNITY): Payer: Self-pay | Admitting: Emergency Medicine

## 2013-12-30 ENCOUNTER — Emergency Department (HOSPITAL_COMMUNITY): Payer: Medicare Other

## 2013-12-30 ENCOUNTER — Inpatient Hospital Stay (HOSPITAL_COMMUNITY)
Admission: EM | Admit: 2013-12-30 | Discharge: 2014-01-03 | DRG: 195 | Disposition: A | Discharge status: Home / Self Care | Payer: Medicare Other | Attending: Internal Medicine | Admitting: Internal Medicine

## 2013-12-30 DIAGNOSIS — Z79899 Other long term (current) drug therapy: Secondary | ICD-10-CM

## 2013-12-30 DIAGNOSIS — R5381 Other malaise: Secondary | ICD-10-CM | POA: Diagnosis present

## 2013-12-30 DIAGNOSIS — I10 Essential (primary) hypertension: Secondary | ICD-10-CM | POA: Diagnosis present

## 2013-12-30 DIAGNOSIS — F1910 Other psychoactive substance abuse, uncomplicated: Secondary | ICD-10-CM

## 2013-12-30 DIAGNOSIS — J449 Chronic obstructive pulmonary disease, unspecified: Secondary | ICD-10-CM

## 2013-12-30 DIAGNOSIS — R509 Fever, unspecified: Secondary | ICD-10-CM | POA: Diagnosis present

## 2013-12-30 DIAGNOSIS — B1920 Unspecified viral hepatitis C without hepatic coma: Secondary | ICD-10-CM | POA: Diagnosis present

## 2013-12-30 DIAGNOSIS — J189 Pneumonia, unspecified organism: Principal | ICD-10-CM

## 2013-12-30 DIAGNOSIS — R5383 Other fatigue: Secondary | ICD-10-CM

## 2013-12-30 DIAGNOSIS — R0902 Hypoxemia: Secondary | ICD-10-CM | POA: Diagnosis present

## 2013-12-30 DIAGNOSIS — F17200 Nicotine dependence, unspecified, uncomplicated: Secondary | ICD-10-CM

## 2013-12-30 DIAGNOSIS — T40605A Adverse effect of unspecified narcotics, initial encounter: Secondary | ICD-10-CM | POA: Diagnosis present

## 2013-12-30 DIAGNOSIS — J4489 Other specified chronic obstructive pulmonary disease: Secondary | ICD-10-CM

## 2013-12-30 DIAGNOSIS — J438 Other emphysema: Secondary | ICD-10-CM | POA: Diagnosis present

## 2013-12-30 DIAGNOSIS — M4800 Spinal stenosis, site unspecified: Secondary | ICD-10-CM | POA: Diagnosis present

## 2013-12-30 DIAGNOSIS — G8929 Other chronic pain: Secondary | ICD-10-CM | POA: Diagnosis present

## 2013-12-30 LAB — BASIC METABOLIC PANEL
BUN: 18 mg/dL (ref 6–23)
CALCIUM: 9.2 mg/dL (ref 8.4–10.5)
CO2: 24 meq/L (ref 19–32)
Chloride: 97 mEq/L (ref 96–112)
Creatinine, Ser: 0.74 mg/dL (ref 0.50–1.10)
GFR calc Af Amer: >90 mL/min (ref >90)
Glucose, Bld: 94 mg/dL (ref 70–99)
POTASSIUM: 3.9 meq/L (ref 3.7–5.3)
SODIUM: 134 meq/L — AB (ref 137–147)

## 2013-12-30 LAB — CBC
HCT: 36.3 % (ref 36.0–46.0)
HEMOGLOBIN: 12.5 g/dL (ref 12.0–15.0)
MCH: 31.1 pg (ref 26.0–34.0)
MCHC: 34.4 g/dL (ref 30.0–36.0)
MCV: 90.3 fL (ref 78.0–100.0)
PLATELETS: 233 10*3/uL (ref 150–400)
RBC: 4.02 MIL/uL (ref 3.87–5.11)
RDW: 14 % (ref 11.5–15.5)
WBC: 7.7 10*3/uL (ref 4.0–10.5)

## 2013-12-30 LAB — HEPATIC FUNCTION PANEL
ALK PHOS: 112 U/L (ref 39–117)
ALT: 132 U/L — AB (ref 0–35)
AST: 121 U/L — ABNORMAL HIGH (ref 0–37)
Albumin: 3.7 g/dL (ref 3.5–5.2)
Total Bilirubin: 0.8 mg/dL (ref 0.3–1.2)
Total Protein: 7.7 g/dL (ref 6.0–8.3)

## 2013-12-30 LAB — RAPID URINE DRUG SCREEN, HOSP PERFORMED
Amphetamines: NOT DETECTED
BARBITURATES: NOT DETECTED
Benzodiazepines: NOT DETECTED
Cocaine: NOT DETECTED
Opiates: POSITIVE — AB
Tetrahydrocannabinol: NOT DETECTED

## 2013-12-30 LAB — I-STAT TROPONIN, ED: TROPONIN I, POC: 0 ng/mL (ref 0.00–0.08)

## 2013-12-30 LAB — ACETAMINOPHEN LEVEL

## 2013-12-30 LAB — SALICYLATE LEVEL: Salicylate Lvl: <2 mg/dL — ABNORMAL LOW (ref 2.8–20.0)

## 2013-12-30 LAB — PRO B NATRIURETIC PEPTIDE: PRO B NATRI PEPTIDE: 1094 pg/mL — AB (ref 0–125)

## 2013-12-30 LAB — D-DIMER, QUANTITATIVE (NOT AT ARMC): D-Dimer, Quant: 1.78 ug/mL-FEU — ABNORMAL HIGH (ref 0.00–0.48)

## 2013-12-30 LAB — ETHANOL: Alcohol, Ethyl (B): <11 mg/dL (ref 0–11)

## 2013-12-30 MED ORDER — SODIUM CHLORIDE 0.9 % IJ SOLN
3.0000 mL | Freq: Two times a day (BID) | INTRAMUSCULAR | Status: DC
Start: 2013-12-30 — End: 2014-01-03
  Administered 2013-12-31 – 2014-01-02 (×4): 3 mL via INTRAVENOUS

## 2013-12-30 MED ORDER — OSELTAMIVIR PHOSPHATE 75 MG PO CAPS
75.0000 mg | ORAL_CAPSULE | Freq: Two times a day (BID) | ORAL | Status: DC
  Administered 2013-12-30: 75 mg via ORAL
  Filled 2013-12-30 (×4): qty 1

## 2013-12-30 MED ORDER — CEFTRIAXONE SODIUM 1 G IJ SOLR
1.0000 g | Freq: Once | INTRAMUSCULAR | Status: AC
  Administered 2013-12-30: 1 g via INTRAVENOUS
  Filled 2013-12-30: qty 10

## 2013-12-30 MED ORDER — VANCOMYCIN HCL IN DEXTROSE 750-5 MG/150ML-% IV SOLN
750.0000 mg | Freq: Two times a day (BID) | INTRAVENOUS | Status: DC
  Administered 2013-12-31 – 2014-01-01 (×3): 750 mg via INTRAVENOUS
  Filled 2013-12-30 (×4): qty 150

## 2013-12-30 MED ORDER — ENOXAPARIN SODIUM 40 MG/0.4ML ~~LOC~~ SOLN
40.0000 mg | SUBCUTANEOUS | Status: DC
  Administered 2013-12-30 – 2014-01-02 (×4): 40 mg via SUBCUTANEOUS
  Filled 2013-12-30 (×5): qty 0.4

## 2013-12-30 MED ORDER — GABAPENTIN 300 MG PO CAPS
300.0000 mg | ORAL_CAPSULE | Freq: Three times a day (TID) | ORAL | Status: DC
  Administered 2013-12-30 – 2014-01-03 (×11): 300 mg via ORAL
  Filled 2013-12-30 (×13): qty 1

## 2013-12-30 MED ORDER — SODIUM CHLORIDE 0.9 % IV SOLN
250.0000 mL | INTRAVENOUS | Status: DC | PRN
  Administered 2013-12-30: 250 mL via INTRAVENOUS

## 2013-12-30 MED ORDER — VANCOMYCIN HCL IN DEXTROSE 1-5 GM/200ML-% IV SOLN
1000.0000 mg | INTRAVENOUS | Status: AC
  Administered 2013-12-30: 1000 mg via INTRAVENOUS
  Filled 2013-12-30: qty 200

## 2013-12-30 MED ORDER — DULOXETINE HCL 60 MG PO CPEP
60.0000 mg | ORAL_CAPSULE | Freq: Two times a day (BID) | ORAL | Status: DC
  Administered 2013-12-30 – 2014-01-03 (×8): 60 mg via ORAL
  Filled 2013-12-30 (×9): qty 1

## 2013-12-30 MED ORDER — ONDANSETRON HCL 4 MG/2ML IJ SOLN: 4.0000 mg | Freq: Four times a day (QID) | INTRAMUSCULAR | Status: DC | PRN

## 2013-12-30 MED ORDER — AZITHROMYCIN 500 MG IV SOLR
500.0000 mg | Freq: Once | INTRAVENOUS | Status: AC
  Administered 2013-12-30: 500 mg via INTRAVENOUS

## 2013-12-30 MED ORDER — SODIUM CHLORIDE 0.9 % IJ SOLN: 3.0000 mL | INTRAMUSCULAR | Status: DC | PRN

## 2013-12-30 MED ORDER — ACETAMINOPHEN 325 MG PO TABS
650.0000 mg | ORAL_TABLET | ORAL | Status: DC | PRN
  Administered 2013-12-30 – 2014-01-02 (×2): 650 mg via ORAL
  Filled 2013-12-30 (×2): qty 2

## 2013-12-30 MED ORDER — DEXTROSE 5 % IV SOLN
1.0000 g | Freq: Three times a day (TID) | INTRAVENOUS | Status: DC
  Administered 2013-12-30 – 2014-01-01 (×5): 1 g via INTRAVENOUS
  Filled 2013-12-30 (×7): qty 1
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
## 2013-12-30 NOTE — ED Provider Notes (Signed)
CSN: [id number]     Arrival date & time 12/30/13  1019 History   First MD Initiated Contact with Patient 12/30/13 1112     Chief Complaint  Patient presents with  . Weakness  . Shortness of Breath  . Chest Pain     (Consider location/radiation/quality/duration/timing/severity/associated sxs/prior Treatment) HPI Comments: Patient presents with generalized weakness and shortness of breath. She states it's been off for last 1-2 weeks. She does report a dry cough. She has some soreness across her lower chest which has been constant and unchanged the last 2 weeks. It's worse with movement and worse with coughing. She does have a history of COPD and is on oxygen at home at 2 L per minute usually only at night. She denies any fevers or chills. She feels weak all over. She does state that she took Klonopin last night. She denies any other alcohol or drug use. History is limited due to her drowsiness.  Patient is a 58 y.o. female presenting with weakness, shortness of breath, and chest pain.  Weakness Associated symptoms include chest pain and shortness of breath. Pertinent negatives include no abdominal pain and no headaches.  Shortness of Breath Associated symptoms: chest pain   Associated symptoms: no abdominal pain, no cough, no diaphoresis, no fever, no headaches, no rash and no vomiting   Chest Pain Associated symptoms: fatigue, shortness of breath and weakness (ggeneralized)   Associated symptoms: no abdominal pain, no back pain, no cough, no diaphoresis, no dizziness, no fever, no headache, no nausea, no numbness and not vomiting     Past Medical History  Diagnosis Date  . COPD (chronic obstructive pulmonary disease)   . Degenerative disc disease   . Spinal stenosis   . DDD (degenerative disc disease)   . DDD (degenerative disc disease)   . Hypertension   . Degenerative disc disease    Past Surgical History  Procedure Laterality Date  . Neck surgery    . Abdominal hysterectomy     . Bladder surgery    . Femur im nail Right 11/28/2012    Procedure: INTRAMEDULLARY (IM) RETROGRADE FEMORAL NAILING wants jackson table , c-arm and biomet ;  Surgeon: Mauri Pole, MD;  Location: [facility];  Service: Orthopedics;  Laterality: Right;   No family history on file. History  Substance Use Topics  . Smoking status: Current Every Day Smoker -- 0.50 packs/day    Types: Cigarettes  . Smokeless tobacco: Never Used  . Alcohol Use: No     Comment: History of ETOH abuse   OB History   Grav Para Term Preterm Abortions TAB SAB Ect Mult Living                 Review of Systems  Constitutional: Positive for fatigue. Negative for fever, chills and diaphoresis.  HENT: Negative for congestion, rhinorrhea and sneezing.   Eyes: Negative.   Respiratory: Positive for shortness of breath. Negative for cough and chest tightness.   Cardiovascular: Positive for chest pain. Negative for leg swelling.  Gastrointestinal: Negative for nausea, vomiting, abdominal pain, diarrhea and blood in stool.  Genitourinary: Negative for frequency, hematuria, flank pain and difficulty urinating.  Musculoskeletal: Negative for arthralgias and back pain.  Skin: Negative for rash.  Neurological: Positive for weakness (ggeneralized). Negative for dizziness, speech difficulty, numbness and headaches.      Allergies  Aspirin  Home Medications   Current Outpatient Rx  Name  Route  Sig  Dispense  Refill  . DULoxetine (CYMBALTA)  60 MG capsule   Oral   Take 60 mg by mouth 2 (two) times daily.         Marland Kitchen gabapentin (NEURONTIN) 300 MG capsule   Oral   Take 300 mg by mouth 3 (three) times daily.          BP 114/79  Pulse 70  Temp(Src) 98.2 F (36.8 C) (Oral)  Resp 18  SpO2 92% Physical Exam  Constitutional: She is oriented to person, place, and time. She appears well-developed and well-nourished.  HENT:  Head: Normocephalic and atraumatic.  Eyes: Pupils are equal, round, and reactive to light.   Neck: Normal range of motion. Neck supple.  Cardiovascular: Normal rate, regular rhythm and normal heart sounds.   Pulmonary/Chest: Effort normal and breath sounds normal. No respiratory distress. She has no wheezes. She has no rales. She exhibits tenderness (tenderness across her lower chest wall).  Abdominal: Soft. Bowel sounds are normal. There is no tenderness. There is no rebound and no guarding.  Musculoskeletal: Normal range of motion. She exhibits no edema.  Lymphadenopathy:    She has no cervical adenopathy.  Neurological: She is oriented to person, place, and time.  Patient is very drowsy but will wake up to verbal stimuli and answer questions. She was actually symmetrically with no focal deficits. No facial drooping or aphasia  Skin: Skin is warm and dry. No rash noted.  Psychiatric: She has a normal mood and affect.    ED Course  Procedures (including critical care time) Labs Review Results for orders placed during the hospital encounter of 12/30/13  CBC      Result Value Ref Range   WBC 7.7  4.0 - 10.5 K/uL   RBC 4.02  3.87 - 5.11 MIL/uL   Hemoglobin 12.5  12.0 - 15.0 g/dL   HCT 36.3  36.0 - 46.0 %   MCV 90.3  78.0 - 100.0 fL   MCH 31.1  26.0 - 34.0 pg   MCHC 34.4  30.0 - 36.0 g/dL   RDW 14.0  11.5 - 15.5 %   Platelets 233  150 - 400 K/uL  BASIC METABOLIC PANEL      Result Value Ref Range   Sodium 134 (*) 137 - 147 mEq/L   Potassium 3.9  3.7 - 5.3 mEq/L   Chloride 97  96 - 112 mEq/L   CO2 24  19 - 32 mEq/L   Glucose, Bld 94  70 - 99 mg/dL   BUN 18  6 - 23 mg/dL   Creatinine, Ser 0.74  0.50 - 1.10 mg/dL   Calcium 9.2  8.4 - 10.5 mg/dL   GFR calc non Af Amer >90  >90 mL/min   GFR calc Af Amer >90  >90 mL/min  PRO B NATRIURETIC PEPTIDE      Result Value Ref Range   Pro B Natriuretic peptide (BNP) 1094.0 (*) 0 - 125 pg/mL  ACETAMINOPHEN LEVEL      Result Value Ref Range   Acetaminophen (Tylenol), Serum <15.0  10 - 30 ug/mL  SALICYLATE LEVEL      Result Value  Ref Range   Salicylate Lvl <1.0 (*) 2.8 - 20.0 mg/dL  URINE RAPID DRUG SCREEN (HOSP PERFORMED)      Result Value Ref Range   Opiates POSITIVE (*) NONE DETECTED   Cocaine NONE DETECTED  NONE DETECTED   Benzodiazepines NONE DETECTED  NONE DETECTED   Amphetamines NONE DETECTED  NONE DETECTED   Tetrahydrocannabinol NONE DETECTED  NONE DETECTED  Barbiturates NONE DETECTED  NONE DETECTED  ETHANOL      Result Value Ref Range   Alcohol, Ethyl (B) <11  0 - 11 mg/dL  D-DIMER, QUANTITATIVE      Result Value Ref Range   D-Dimer, Quant 1.78 (*) 0.00 - 0.48 ug/mL-FEU  I-STAT TROPOININ, ED      Result Value Ref Range   Troponin i, poc 0.00  0.00 - 0.08 ng/mL   Comment 3            Dg Chest 2 View  12/30/2013   CLINICAL DATA:  COPD, cough  EXAM: CHEST  2 VIEW  COMPARISON:  DG CHEST 2 VIEW dated 06/28/2013; CT ABD/PELV WO CM dated 06/22/2011; DG CHEST 2 VIEW dated 10/29/2011; XR-CHEST 2 VIEWS dated 06/25/2012  FINDINGS: There is severe bilateral bullous emphysema involving the upper lobes. There is bilateral lower lobe airspace disease. No pleural effusion or pneumothorax. The heart and mediastinal contours are unremarkable.  The osseous structures are unremarkable.  IMPRESSION: 1. Bilateral lower lobe airspace disease most concerning for bibasilar pneumonia. 2. Severe bilateral bullous emphysema most severe in the upper lobes.   Electronically Signed   By: Kathreen Devoid   On: 12/30/2013 13:23     Imaging Review Dg Chest 2 View  12/30/2013   CLINICAL DATA:  COPD, cough  EXAM: CHEST  2 VIEW  COMPARISON:  DG CHEST 2 VIEW dated 06/28/2013; CT ABD/PELV WO CM dated 06/22/2011; DG CHEST 2 VIEW dated 10/29/2011; XR-CHEST 2 VIEWS dated 06/25/2012  FINDINGS: There is severe bilateral bullous emphysema involving the upper lobes. There is bilateral lower lobe airspace disease. No pleural effusion or pneumothorax. The heart and mediastinal contours are unremarkable.  The osseous structures are unremarkable.  IMPRESSION: 1.  Bilateral lower lobe airspace disease most concerning for bibasilar pneumonia. 2. Severe bilateral bullous emphysema most severe in the upper lobes.   Electronically Signed   By: Kathreen Devoid   On: 12/30/2013 13:23     EKG Interpretation   Date/Time:  Wednesday December 30 2013 10:34:03 EDT Ventricular Rate:  91 PR Interval:  99 QRS Duration: 96 QT Interval:  373 QTC Calculation: 459 R Axis:   -121 Text Interpretation:  Sinus rhythm Short PR interval Right axis deviation  since last tracing no significant change Confirmed by Colon Rueth  MD, Theola Cuellar  ([id number]) on 12/30/2013 11:54:44 AM      MDM   Final diagnoses:  Community acquired pneumonia    Patient presents with cough and shortness of breath. She had hypoxia with an oxygen saturation of 86% on room air. She's satting 92-93% on nasal cannula. Chest x-ray shows evidence of bibasilar pneumonia. She was started on antibiotics for community acquired pneumonia. Her d-dimer is elevated and given this I will do a CT chest rule out PE. I did talk with the hospitalist who will admit the patient.    Malvin Johns, MD 12/30/13 [phone]

## 2013-12-30 NOTE — ED Notes (Signed)
Patient denies any drug or ETOH use today.

## 2013-12-30 NOTE — Progress Notes (Signed)
ANTIBIOTIC CONSULT NOTE - INITIAL  Pharmacy Consult for Vancomycin, antibiotic renal dose adjustment  Indication: pneumonia  Allergies  Allergen Reactions  . Aspirin     Upset stomach    Patient Measurements: Height: 5\' 7"  (170.2 cm) Weight: 125 lb (56.7 kg) IBW/kg (Calculated) : 61.6  Vital Signs: Temp: 97.4 F (36.3 C) (03/25 1714) Temp src: Oral (03/25 1714) BP: 109/70 mmHg (03/25 1714) Pulse Rate: 72 (03/25 1714)  Labs:  Recent Labs  12/30/13 1115  WBC 7.7  HGB 12.5  PLT 233  CREATININE 0.74   Estimated Creatinine Clearance: 71.1 ml/min (by C-G formula based on Cr of 0.74).   Microbiology: No results found for this or any previous visit (from the past 720 hour(s)).  Medical History: Past Medical History  Diagnosis Date  . COPD (chronic obstructive pulmonary disease)   . Degenerative disc disease   . Spinal stenosis   . DDD (degenerative disc disease)   . DDD (degenerative disc disease)   . Hypertension   . Degenerative disc disease     Anti-infectives:  3/25 >> azithromycin x1 3/25 >> ceftriaxone x1 3/25 >> oseltamivir >> 3/25 >> cefepime >> 3/25 >> vancomycin >>  Assessment: 63 yoF admitted 3/25 with weakness, SOB, suspected HCAP.  PMH significant for COPD, ongoing tobacco abuse, codeine abuse, and recent hospitalization (11/2013) for right femoral intertrochanteric fracture.  Pharmacy is consulted to dose vancomycin and renally adjust antibiotic doses as needed.  Tmax: 98.2  WBCs: 7.7  Renal: SCr 0.74, CrCl ~ 71 ml/min   Cultures pending  Goal of Therapy:  Vancomycin trough level 15-20 mcg/ml  Plan:   Cefepime 1g IV q8h x 8 days  Vancomycin 1g IV x1 then 750mg  IV q12h x 8 days  Measure Vanc trough at steady state.  Follow up renal fxn and culture results.   Gretta Arab PharmD, BCPS Pager [phone] 12/30/2013 7:12 PM

## 2013-12-30 NOTE — ED Notes (Signed)
Pt here c/o weakness, CP, and SOB x 1.5 wks (when she went to jail for a week).  Pt appears to be under the influence of something.  Pt unable to open her eyes fully and trails off asleep while talking to triage nurse.  Centralized CP, described as a cramping/stabbing pain.

## 2013-12-31 ENCOUNTER — Other Ambulatory Visit (HOSPITAL_COMMUNITY): Payer: Medicare Other

## 2013-12-31 DIAGNOSIS — R5383 Other fatigue: Secondary | ICD-10-CM

## 2013-12-31 DIAGNOSIS — R5381 Other malaise: Secondary | ICD-10-CM

## 2013-12-31 DIAGNOSIS — J189 Pneumonia, unspecified organism: Principal | ICD-10-CM

## 2013-12-31 DIAGNOSIS — F17200 Nicotine dependence, unspecified, uncomplicated: Secondary | ICD-10-CM

## 2013-12-31 DIAGNOSIS — F1910 Other psychoactive substance abuse, uncomplicated: Secondary | ICD-10-CM

## 2013-12-31 LAB — LEGIONELLA ANTIGEN, URINE: Legionella Antigen, Urine: NEGATIVE

## 2013-12-31 LAB — INFLUENZA PANEL BY PCR (TYPE A & B)
H1N1 flu by pcr: NOT DETECTED
Influenza A By PCR: NEGATIVE
Influenza B By PCR: NEGATIVE

## 2013-12-31 LAB — STREP PNEUMONIAE URINARY ANTIGEN: STREP PNEUMO URINARY ANTIGEN: NEGATIVE

## 2013-12-31 LAB — HIV ANTIBODY (ROUTINE TESTING W REFLEX): HIV: NONREACTIVE

## 2013-12-31 MED ORDER — IPRATROPIUM-ALBUTEROL 0.5-2.5 (3) MG/3ML IN SOLN: 3.0000 mL | Freq: Four times a day (QID) | RESPIRATORY_TRACT | Status: DC | PRN

## 2013-12-31 MED ORDER — IBUPROFEN 600 MG PO TABS
600.0000 mg | ORAL_TABLET | Freq: Three times a day (TID) | ORAL | Status: DC | PRN
  Administered 2014-01-01 (×2): 600 mg via ORAL
  Filled 2013-12-31 (×2): qty 1

## 2013-12-31 MED ORDER — BIOTENE DRY MOUTH MT LIQD
15.0000 mL | Freq: Two times a day (BID) | OROMUCOSAL | Status: DC
  Administered 2013-12-31 – 2014-01-02 (×6): 15 mL via OROMUCOSAL

## 2013-12-31 MED ORDER — TRAZODONE HCL 50 MG PO TABS
50.0000 mg | ORAL_TABLET | Freq: Once | ORAL | Status: AC
  Administered 2013-12-31: 50 mg via ORAL
  Filled 2013-12-31: qty 1

## 2013-12-31 NOTE — Care Management Note (Signed)
   CARE MANAGEMENT NOTE 12/31/2013  Patient:  Emily Moran, Emily Moran   Account Number:  [id number]  Date Initiated:  12/31/2013  Documentation initiated by:  Kinslie Hove  Subjective/Objective Assessment:   58 yo female admitted Healthcare-associated pneumonia.     Action/Plan:   Home when stable   Anticipated DC Date:     Anticipated DC Plan:  [place]  CM consult      Choice offered to / List presented to:     DME arranged  NA      DME agency  NA     [place] arranged  NA      [place] agency  NA   Status of service:  In process, will continue to follow Medicare Important Message given?   (If response is "NO", the following Medicare IM given date fields will be blank) Date Medicare IM given:   Date Additional Medicare IM given:    Discharge Disposition:    Per UR Regulation:  Reviewed for med. necessity/level of care/duration of stay  If discussed at [place] of Stay Meetings, dates discussed:    Comments:  12/31/13 1219 Alinda Deem [phone] Chart reviewed for uitlization of services. No needs identified at this time.

## 2013-12-31 NOTE — Progress Notes (Signed)
Care notes re: tobacco cessation rendered to patient,encouraged to read info., verbalized understanding.- Sandie Ano RN

## 2013-12-31 NOTE — Progress Notes (Signed)
PROGRESS NOTE   JYSSICA RIEF RKY:[id number] DOB: 30-Oct-1955 DOA: 12/30/2013 PCP: Philis Fendt, MD  Brief narrative: Emily Moran is an 58 y.o. female with PMH of COPD, chronic back pain, polysubstance abuse was admitted 12/30/13 with fever, chills and cough productive of green sputum. She was hospitalized 11/28/12-12/01/13 for treatment of a right femoral intertrochanteric fracture. Her chest x-ray showed possible pneumonia so she was admitted for treatment of HCAP.  Assessment/Plan: Principal Problem:  Healthcare-associated pneumonia  Blood cultures sent. Influenza panel negative. Sputum culture not sent yet, strep pneumonia negative, Legionella antigen pending and HIV nonreactive. No current bronchospasm. Continue cefepime and vancomycin. Active Problems:  COPD Nebulized bronchodilator therapy when necessary. Elevated proBNP No clinical evidence of heart failure. Suspect this is from pulmonary edema in the setting of polysubstance abuse. HEPATITIS C  Mild AST/ALT elevation noted.  TOBACCO ABUSE  Tobacco cessation counseling requested per nursing staff.  SUBSTANCE ABUSE, MULTIPLE  UDS positive for opiates. Would avoid narcotics in this patient given her history of substance abuse.  Lethargy  Likely secondary to opiate use. More alert today. DVT prophylaxis  Lovenox ordered.   Code Status: Full.  Family Communication: No family at the bedside.  Disposition Plan: Home when stable.   IV access:  Peripheral IV  Medical Consultants:  None  Other Consultants:  None  Anti-infectives:  Cefepime 12/30/13--->  Vancomycin 12/30/13--->  Rocephin 12/30/13---> 12/30/13  Azithromycin 12/30/13---> 12/30/13  Tamiflu 12/30/13---> 12/30/13   HPI/Subjective: Emily Moran says she feels weak, but is less dyspneic with less pleuritic pain. She continues to have an occasional cough. Says her appetite is "picking up ". No nausea, vomiting or diarrhea.  Objective: Filed Vitals:    12/30/13 1549 12/30/13 1714 12/30/13 2041 12/31/13 0544  BP: 101/69 109/70 103/70 90/65  Pulse: 76 72 70 68  Temp:  97.4 F (36.3 C) 98.1 F (36.7 C) 97.5 F (36.4 C)  TempSrc:  Oral Oral Oral  Resp: 20 15 18 16   Height:  5\' 7"  (1.702 m)    Weight:  56.7 kg (125 lb)    SpO2: 98% 99% 100% 98%    Intake/Output Summary (Last 24 hours) at 12/31/13 1119 Last data filed at 12/31/13 3151  Gross per 24 hour  Intake    240 ml  Output   2150 ml  Net  -1910 ml    Exam: Gen:  NAD, more alert  Cardiovascular:  RRR, No M/R/G Respiratory:  Lungs diminished with a few bibasilar crackles, no wheeze Gastrointestinal:  Abdomen soft, NT/ND, + BS Extremities:  No C/E/C  Data Reviewed: Basic Metabolic Panel:  Recent Labs Lab 12/30/13 1115  NA 134*  K 3.9  CL 97  CO2 24  GLUCOSE 94  BUN 18  CREATININE 0.74  CALCIUM 9.2   GFR Estimated Creatinine Clearance: 71.1 ml/min (by C-G formula based on Cr of 0.74). Liver Function Tests:  Recent Labs Lab 12/30/13 1115  AST 121*  ALT 132*  ALKPHOS 112  BILITOT 0.8  PROT 7.7  ALBUMIN 3.7   CBC:  Recent Labs Lab 12/30/13 1115  WBC 7.7  HGB 12.5  HCT 36.3  MCV 90.3  PLT 233   BNP (last 3 results)  Recent Labs  12/30/13 1115  PROBNP 1094.0*   D-Dimer  Recent Labs  12/30/13 1200  DDIMER 1.78*   Microbiology No results found for this or any previous visit (from the past 240 hour(s)).   Procedures and Diagnostic Studies: Dg Chest 2 View  12/30/2013   CLINICAL DATA:  COPD, cough  EXAM: CHEST  2 VIEW  COMPARISON:  DG CHEST 2 VIEW dated 06/28/2013; CT ABD/PELV WO CM dated 06/22/2011; DG CHEST 2 VIEW dated 10/29/2011; XR-CHEST 2 VIEWS dated 06/25/2012  FINDINGS: There is severe bilateral bullous emphysema involving the upper lobes. There is bilateral lower lobe airspace disease. No pleural effusion or pneumothorax. The heart and mediastinal contours are unremarkable.  The osseous structures are unremarkable.  IMPRESSION: 1.  Bilateral lower lobe airspace disease most concerning for bibasilar pneumonia. 2. Severe bilateral bullous emphysema most severe in the upper lobes.   Electronically Signed   By: Kathreen Devoid   On: 12/30/2013 13:23    Scheduled Meds: . antiseptic oral rinse  15 mL Mouth Rinse q12n4p  . ceFEPime (MAXIPIME) IV  1 g Intravenous 3 times per day  . DULoxetine  60 mg Oral BID  . enoxaparin (LOVENOX) injection  40 mg Subcutaneous Q24H  . gabapentin  300 mg Oral TID  . sodium chloride  3 mL Intravenous Q12H  . vancomycin  750 mg Intravenous Q12H   Continuous Infusions:   Time spent: 25 minutes.    LOS: 1 day   RAMA,CHRISTINA  [facility] Pager [phone]. If unable to reach me by pager, please call my cell phone at [phone].  *Please note that the hospitalists switch teams on Wednesdays. Please call the flow manager at [phone] if you are having difficulty reaching the hospitalist taking care of this patient as she can update you and provide the most up-to-date pager number of provider caring for the patient. If 8PM-8AM, please contact night-coverage at [email or web address], password [facility]  12/31/2013, 11:19 AM    **Disclaimer: This note was dictated with voice recognition software. Similar sounding words can inadvertently be transcribed and this note may contain transcription errors which may not have been corrected upon publication of note.**

## 2014-01-01 DIAGNOSIS — J449 Chronic obstructive pulmonary disease, unspecified: Secondary | ICD-10-CM

## 2014-01-01 LAB — EXPECTORATED SPUTUM ASSESSMENT W GRAM STAIN, RFLX TO RESP C: Special Requests: NORMAL

## 2014-01-01 LAB — VANCOMYCIN, TROUGH: Vancomycin Tr: 17.1 ug/mL (ref 10.0–20.0)

## 2014-01-01 MED ORDER — TRAZODONE HCL 50 MG PO TABS
50.0000 mg | ORAL_TABLET | Freq: Every evening | ORAL | Status: DC | PRN
  Administered 2014-01-01: 50 mg via ORAL
  Filled 2014-01-01: qty 1

## 2014-01-01 MED ORDER — LEVOFLOXACIN 750 MG PO TABS
750.0000 mg | ORAL_TABLET | Freq: Every day | ORAL | Status: DC
  Administered 2014-01-01 – 2014-01-03 (×3): 750 mg via ORAL
  Filled 2014-01-01 (×3): qty 1

## 2014-01-01 NOTE — Progress Notes (Signed)
ANTIBIOTIC CONSULT NOTE - Follow up  Pharmacy Consult for Vancomycin, antibiotic renal dose adjustment  Indication: pneumonia  Allergies  Allergen Reactions  . Aspirin     Upset stomach    Patient Measurements: Height: 5\' 7"  (170.2 cm) Weight: 125 lb (56.7 kg) IBW/kg (Calculated) : 61.6  Vital Signs: Temp: 98.2 F (36.8 C) (03/27 0502) Temp src: Oral (03/27 0502) BP: 114/78 mmHg (03/27 0502) Pulse Rate: 73 (03/27 0502)  Labs:  Recent Labs  12/30/13 1115  WBC 7.7  HGB 12.5  PLT 233  CREATININE 0.74   Estimated Creatinine Clearance: 71.1 ml/min (by C-G formula based on Cr of 0.74).   Anti-infectives:  3/25 >> azithromycin x1 3/25 >> ceftriaxone x1 3/25 >> oseltamivir X1 3/25 >> cefepime >> 3/25 >> vancomycin >>  Microbiology 3/25 blood x2: sent 3/25 sputum: sent 3/25 Strep pneumonia (negative) and Legionella antigens (negative) 3/25 Flu PCR: negative  Assessment: 37 yoF admitted 3/25 with weakness, SOB, suspected HCAP.  PMH significant for COPD, ongoing tobacco abuse, codeine abuse, and recent hospitalization (11/2013) for right femoral intertrochanteric fracture.  Pharmacy is consulted to dose vancomycin and renally adjust antibiotic doses as needed.  Patient remains afebrile  WBCs: 7.7 (3/25)  Renal: SCr 0.74 (3/25), CrCl ~ 71 ml/min, UOP nml  Cultures pending as above  Vancomycin trough this AM within goal range at 17.1 mcg/mL  Goal of Therapy:  Vancomycin trough level 15-20 mcg/ml  Plan:   Continue cefepime 1g IV q8h x 8 days total  Continue vancomycin 750mg  IV q12h x 8 days total  Follow up renal fxn and culture results.  Follow up antibiotic de-escalation   Follow up bmet and CBC in AM  Thank you for the consult.  Johny Drilling, PharmD, BCPS Pager: [phone] Pharmacy: [phone] 01/01/2014 7:37 AM

## 2014-01-01 NOTE — Progress Notes (Signed)
PROGRESS NOTE   [name] MHD:[id number] DOB: 1956/03/24 DOA: 12/30/2013 PCP: Philis Fendt, MD  Brief narrative: Emily Moran is an 58 y.o. female with PMH of COPD, chronic back pain, polysubstance abuse was admitted 12/30/13 with fever, chills and cough productive of green sputum. She was hospitalized 11/28/12-12/01/13 for treatment of a right femoral intertrochanteric fracture. Her chest x-ray showed possible pneumonia so she was admitted for treatment of HCAP.  Assessment/Plan: Principal Problem:  Healthcare-associated pneumonia  Blood cultures sent. Influenza panel negative. Sputum culture pending, strep pneumonia negative, Legionella antigen negative and HIV nonreactive. No current bronchospasm. D/C cefepime and vancomycin and start Levaquin in anticipation of discharge home soon. Active Problems:  COPD Continue nebulized bronchodilator therapy when necessary. Elevated proBNP No clinical evidence of heart failure. Suspect this is from pulmonary edema in the setting of polysubstance abuse. HEPATITIS C  Mild AST/ALT elevation noted.  TOBACCO ABUSE  Tobacco cessation counseling requested per nursing staff.  SUBSTANCE ABUSE, MULTIPLE  UDS positive for opiates. Would avoid narcotics in this patient given her history of substance abuse.  Lethargy  Likely secondary to opiate use. More alert today. DVT prophylaxis  Lovenox ordered.   Code Status: Full.  Family Communication: No family at the bedside.  Disposition Plan: Home when stable.   IV access:  Peripheral IV  Medical Consultants:  None  Other Consultants:  None  Anti-infectives:  Cefepime 12/30/13--->  Vancomycin 12/30/13--->  Rocephin 12/30/13---> 12/30/13  Azithromycin 12/30/13---> 12/30/13  Tamiflu 12/30/13---> 12/30/13   HPI/Subjective: Emily Moran says she still feels weak, but is less dyspneic with less pleuritic pain. She continues to have an occasional cough. No nausea vomiting or diarrhea.  Appetite fair. Complains of some back pain for which she has been given ibuprofen.  Objective: Filed Vitals:   12/31/13 0544 12/31/13 1354 12/31/13 2053 01/01/14 0502  BP: 90/65 104/79 116/74 114/78  Pulse: 68 77 74 73  Temp: 97.5 F (36.4 C) 98.1 F (36.7 C) 97.8 F (36.6 C) 98.2 F (36.8 C)  TempSrc: Oral Oral Oral Oral  Resp: 16 15 18 18   Height:      Weight:      SpO2: 98% 100% 100% 97%    Intake/Output Summary (Last 24 hours) at 01/01/14 0755 Last data filed at 01/01/14 2119  Gross per 24 hour  Intake 2278.5 ml  Output   1450 ml  Net  828.5 ml    Exam: Gen:  NAD, more alert  Cardiovascular:  RRR, No M/R/G Respiratory:  Lungs diminished but clear Gastrointestinal:  Abdomen soft, NT/ND, + BS Extremities:  No C/E/C  Data Reviewed: Basic Metabolic Panel:  Recent Labs Lab 12/30/13 1115  NA 134*  K 3.9  CL 97  CO2 24  GLUCOSE 94  BUN 18  CREATININE 0.74  CALCIUM 9.2   GFR Estimated Creatinine Clearance: 71.1 ml/min (by C-G formula based on Cr of 0.74). Liver Function Tests:  Recent Labs Lab 12/30/13 1115  AST 121*  ALT 132*  ALKPHOS 112  BILITOT 0.8  PROT 7.7  ALBUMIN 3.7   CBC:  Recent Labs Lab 12/30/13 1115  WBC 7.7  HGB 12.5  HCT 36.3  MCV 90.3  PLT 233   BNP (last 3 results)  Recent Labs  12/30/13 1115  PROBNP 1094.0*   D-Dimer  Recent Labs  12/30/13 1200  DDIMER 1.78*   Microbiology Recent Results (from the past 240 hour(s))  CULTURE, EXPECTORATED SPUTUM-ASSESSMENT     Status: None  Collection Time    01/01/14  5:11 AM      Result Value Ref Range Status   Specimen Description SPUTUM   Final   Special Requests Normal   Final   Sputum evaluation     Final   Value: THIS SPECIMEN IS ACCEPTABLE. RESPIRATORY CULTURE REPORT TO FOLLOW.   Report Status 01/01/2014 FINAL   Final     Procedures and Diagnostic Studies: Dg Chest 2 View  12/30/2013   CLINICAL DATA:  COPD, cough  EXAM: CHEST  2 VIEW  COMPARISON:  DG CHEST 2  VIEW dated 06/28/2013; CT ABD/PELV WO CM dated 06/22/2011; DG CHEST 2 VIEW dated 10/29/2011; XR-CHEST 2 VIEWS dated 06/25/2012  FINDINGS: There is severe bilateral bullous emphysema involving the upper lobes. There is bilateral lower lobe airspace disease. No pleural effusion or pneumothorax. The heart and mediastinal contours are unremarkable.  The osseous structures are unremarkable.  IMPRESSION: 1. Bilateral lower lobe airspace disease most concerning for bibasilar pneumonia. 2. Severe bilateral bullous emphysema most severe in the upper lobes.   Electronically Signed   By: Kathreen Devoid   On: 12/30/2013 13:23    Scheduled Meds: . antiseptic oral rinse  15 mL Mouth Rinse q12n4p  . ceFEPime (MAXIPIME) IV  1 g Intravenous 3 times per day  . DULoxetine  60 mg Oral BID  . enoxaparin (LOVENOX) injection  40 mg Subcutaneous Q24H  . gabapentin  300 mg Oral TID  . sodium chloride  3 mL Intravenous Q12H  . vancomycin  750 mg Intravenous Q12H   Continuous Infusions:   Time spent: 25 minutes.    LOS: 2 days   Ivylynn Hoppes  [facility] Pager [phone]. If unable to reach me by pager, please call my cell phone at [phone].  *Please note that the hospitalists switch teams on Wednesdays. Please call the flow manager at [phone] if you are having difficulty reaching the hospitalist taking care of this patient as she can update you and provide the most up-to-date pager number of provider caring for the patient. If 8PM-8AM, please contact night-coverage at [email or web address], password [facility]  01/01/2014, 7:55 AM    **Disclaimer: This note was dictated with voice recognition software. Similar sounding words can inadvertently be transcribed and this note may contain transcription errors which may not have been corrected upon publication of note.**     In an effort to keep you and your family informed about your hospital stay, I am providing you with this information sheet. If you or your family have any  questions, please do not hesitate to have the nursing staff page me to set up a meeting time.  Emily Moran 01/01/2014 2 (Number of days in the hospital)  Treatment team:  Dr. Margreta Journey Ananth Fiallos, Hospitalist (Internist)   Active Treatment Issues with Plan: Principal Problem:  Healthcare-associated pneumonia  Your chest x-ray showed pneumonia in both lower lungs, and emphysema or COPD. Influenza panel negative. Sputum culture pending, strep pneumonia negative, Legionella antigen negative and HIV nonreactive. Blood cultures have been negative to date currently being treated with broad-spectrum antibiotics including cefepime and vancomycin. If you are feeling better today, we will likely narrow these antibiotics. Active Problems:  COPD COPD is when there has been damage to the lungs, usually from smoking. You may wheeze or feel short of breath at times, especially if you have an upper respiratory infection. If you feel like you are wheezing, aspirin nurse for a breathing treatment. These treatments will open up your airways and  help with the wheezing. History of HEPATITIS C  Recommend regular followup with a primary care doctor to keep a check on your liver function test.  TOBACCO ABUSE  Strongly recommend you discontinue smoking as this may lead to further damage to your lungs and put you at risk for lung cancer.  Blood clot prevention  U on Lovenox which is given underneath the skin by injection to prevent blood clots.  Anticipated discharge date: 1-2 days, depending on progress.

## 2014-01-02 LAB — BASIC METABOLIC PANEL
BUN: 13 mg/dL (ref 6–23)
CHLORIDE: 103 meq/L (ref 96–112)
CO2: 24 mEq/L (ref 19–32)
Calcium: 8.7 mg/dL (ref 8.4–10.5)
Creatinine, Ser: 1.05 mg/dL (ref 0.50–1.10)
GFR, EST AFRICAN AMERICAN: 68 mL/min — AB (ref >90)
GFR, EST NON AFRICAN AMERICAN: 59 mL/min — AB (ref >90)
GLUCOSE: 86 mg/dL (ref 70–99)
Potassium: 3.8 mEq/L (ref 3.7–5.3)
SODIUM: 138 meq/L (ref 137–147)

## 2014-01-02 LAB — CBC
HCT: 37 % (ref 36.0–46.0)
Hemoglobin: 11.9 g/dL — ABNORMAL LOW (ref 12.0–15.0)
MCH: 30.3 pg (ref 26.0–34.0)
MCHC: 32.2 g/dL (ref 30.0–36.0)
MCV: 94.1 fL (ref 78.0–100.0)
Platelets: 246 10*3/uL (ref 150–400)
RBC: 3.93 MIL/uL (ref 3.87–5.11)
RDW: 14.3 % (ref 11.5–15.5)
WBC: 6.3 10*3/uL (ref 4.0–10.5)

## 2014-01-02 NOTE — Progress Notes (Addendum)
PROGRESS NOTE   [name] IZT:[id number] DOB: 02-26-1956 DOA: 12/30/2013 PCP: Philis Fendt, MD  Brief narrative: Emily Moran is an 58 y.o. female with PMH of COPD, chronic back pain, polysubstance abuse was admitted 12/30/13 with fever, chills and cough productive of green sputum. She was hospitalized 11/28/12-12/01/13 for treatment of a right femoral intertrochanteric fracture. Her chest x-ray showed possible pneumonia so she was admitted for treatment of HCAP.  Assessment/Plan: Principal Problem:  Healthcare-associated pneumonia  Blood cultures sent. Influenza panel negative. Sputum culture pending, strep pneumonia negative, Legionella antigen negative and HIV nonreactive. No current bronchospasm. Cefepime and vancomycin D/C'd on 01/01/13 and Levaquin started.  Still feels weak.  PT evaluation requested. Active Problems:  COPD Continue nebulized bronchodilator therapy when necessary. Elevated proBNP No clinical evidence of heart failure. Suspect this is from pulmonary edema in the setting of polysubstance abuse. HEPATITIS C  Mild AST/ALT elevation noted.  TOBACCO ABUSE  Tobacco cessation counseling requested per nursing staff.  SUBSTANCE ABUSE, MULTIPLE  UDS positive for opiates. Would avoid narcotics in this patient given her history of substance abuse. SW consulted to provide community resources/counseling. Lethargy  Likely secondary to opiate use. More alert today. DVT prophylaxis  Lovenox ordered.   Code Status: Full.  Family Communication: No family at the bedside.  Disposition Plan: Home when stable.   IV access:  Peripheral IV  Medical Consultants:  None  Other Consultants:  None  Anti-infectives:  Cefepime 12/30/13--->  Vancomycin 12/30/13--->  Rocephin 12/30/13---> 12/30/13  Azithromycin 12/30/13---> 12/30/13  Tamiflu 12/30/13---> 12/30/13   HPI/Subjective: Emily Moran says she still feels too weak to go home.  Dyspnea improved.  Having  episodes of diaphoresis.  Appetite fair.  Afebrile.  RN reports that they suspect she has been smoking in her room.  Objective: Filed Vitals:   01/01/14 0502 01/01/14 1401 01/01/14 2032 01/02/14 0508  BP: 114/78 106/79 120/76 103/71  Pulse: 73 85 74 74  Temp: 98.2 F (36.8 C) 97.4 F (36.3 C) 98.2 F (36.8 C) 97.7 F (36.5 C)  TempSrc: Oral Oral Oral Oral  Resp: 18 18 18 16   Height:      Weight:      SpO2: 97% 100% 100% 97%    Intake/Output Summary (Last 24 hours) at 01/02/14 0806 Last data filed at 01/02/14 0509  Gross per 24 hour  Intake 1443.5 ml  Output      0 ml  Net 1443.5 ml    Exam: Gen:  NAD, more alert  Cardiovascular:  RRR, No M/R/G Respiratory:  Lungs diminished but clear Gastrointestinal:  Abdomen soft, NT/ND, + BS Extremities:  No C/E/C  Data Reviewed: Basic Metabolic Panel:  Recent Labs Lab 12/30/13 1115 01/02/14 0345  NA 134* 138  K 3.9 3.8  CL 97 103  CO2 24 24  GLUCOSE 94 86  BUN 18 13  CREATININE 0.74 1.05  CALCIUM 9.2 8.7   GFR Estimated Creatinine Clearance: 54.2 ml/min (by C-G formula based on Cr of 1.05). Liver Function Tests:  Recent Labs Lab 12/30/13 1115  AST 121*  ALT 132*  ALKPHOS 112  BILITOT 0.8  PROT 7.7  ALBUMIN 3.7   CBC:  Recent Labs Lab 12/30/13 1115 01/02/14 0345  WBC 7.7 6.3  HGB 12.5 11.9*  HCT 36.3 37.0  MCV 90.3 94.1  PLT 233 246   BNP (last 3 results)  Recent Labs  12/30/13 1115  PROBNP 1094.0*   D-Dimer  Recent Labs  12/30/13 1200  DDIMER 1.78*   Microbiology Recent Results (from the past 240 hour(s))  CULTURE, BLOOD (ROUTINE X 2)     Status: None   Collection Time    12/30/13  8:08 PM      Result Value Ref Range Status   Specimen Description BLOOD LEFT FOREARM   Final   Special Requests BOTTLES DRAWN AEROBIC ONLY 5CC   Final   Culture  Setup Time     Final   Value: 12/31/2013 00:32     Performed at [facility]   Culture     Final   Value:        BLOOD CULTURE  RECEIVED NO GROWTH TO DATE CULTURE WILL BE HELD FOR 5 DAYS BEFORE ISSUING A FINAL NEGATIVE REPORT     Performed at [facility]   Report Status PENDING   Incomplete  CULTURE, BLOOD (ROUTINE X 2)     Status: None   Collection Time    12/30/13  8:12 PM      Result Value Ref Range Status   Specimen Description BLOOD RIGHT FOREARM   Final   Special Requests BOTTLES DRAWN AEROBIC AND ANAEROBIC 10CC   Final   Culture  Setup Time     Final   Value: 12/31/2013 00:30     Performed at [facility]   Culture     Final   Value:        BLOOD CULTURE RECEIVED NO GROWTH TO DATE CULTURE WILL BE HELD FOR 5 DAYS BEFORE ISSUING A FINAL NEGATIVE REPORT     Performed at [facility]   Report Status PENDING   Incomplete  CULTURE, EXPECTORATED SPUTUM-ASSESSMENT     Status: None   Collection Time    01/01/14  5:11 AM      Result Value Ref Range Status   Specimen Description SPUTUM   Final   Special Requests Normal   Final   Sputum evaluation     Final   Value: THIS SPECIMEN IS ACCEPTABLE. RESPIRATORY CULTURE REPORT TO FOLLOW.   Report Status 01/01/2014 FINAL   Final  CULTURE, RESPIRATORY (NON-EXPECTORATED)     Status: None   Collection Time    01/01/14  5:11 AM      Result Value Ref Range Status   Specimen Description SPUTUM   Final   Special Requests NONE   Final   Gram Stain     Final   Value: MODERATE WBC PRESENT,BOTH PMN AND MONONUCLEAR     MODERATE SQUAMOUS EPITHELIAL CELLS PRESENT     FEW GRAM POSITIVE COCCI IN PAIRS     Performed at [facility]   Culture PENDING   Incomplete   Report Status PENDING   Incomplete     Procedures and Diagnostic Studies: Dg Chest 2 View  12/30/2013   CLINICAL DATA:  COPD, cough  EXAM: CHEST  2 VIEW  COMPARISON:  DG CHEST 2 VIEW dated 06/28/2013; CT ABD/PELV WO CM dated 06/22/2011; DG CHEST 2 VIEW dated 10/29/2011; XR-CHEST 2 VIEWS dated 06/25/2012  FINDINGS: There is severe bilateral bullous emphysema involving the upper lobes. There  is bilateral lower lobe airspace disease. No pleural effusion or pneumothorax. The heart and mediastinal contours are unremarkable.  The osseous structures are unremarkable.  IMPRESSION: 1. Bilateral lower lobe airspace disease most concerning for bibasilar pneumonia. 2. Severe bilateral bullous emphysema most severe in the upper lobes.   Electronically Signed   By: Kathreen Devoid   On: 12/30/2013 13:23  Scheduled Meds: . antiseptic oral rinse  15 mL Mouth Rinse q12n4p  . DULoxetine  60 mg Oral BID  . enoxaparin (LOVENOX) injection  40 mg Subcutaneous Q24H  . gabapentin  300 mg Oral TID  . levofloxacin  750 mg Oral Daily  . sodium chloride  3 mL Intravenous Q12H   Continuous Infusions:   Time spent: 25 minutes.    LOS: 3 days   [facility] Pager [phone]. If unable to reach me by pager, please call my cell phone at [phone].  *Please note that the hospitalists switch teams on Wednesdays. Please call the flow manager at [phone] if you are having difficulty reaching the hospitalist taking care of this patient as she can update you and provide the most up-to-date pager number of provider caring for the patient. If 8PM-8AM, please contact night-coverage at [email or web address], password [facility]  01/02/2014, 8:06 AM    **Disclaimer: This note was dictated with voice recognition software. Similar sounding words can inadvertently be transcribed and this note may contain transcription errors which may not have been corrected upon publication of note.**

## 2014-01-02 NOTE — Progress Notes (Addendum)
PT Cancellation Note  Patient Details Name: Emily Moran MRN: [id number] DOB: [birth year]   Cancelled Treatment:    Reason Eval/Treat Not Completed: PT screened, no needs identified, will sign off Pt reports she amb in hallway today without difficulty (NT and RN confirm); she is moving about during our conversation independently in the bed; she denies any difficulty with mobility and feels she has no PT needs at this time; I have encouraged her to continue to amb  With staff; PT will sign off   [facility] 01/02/2014, 3:24 PM

## 2014-01-03 LAB — CULTURE, RESPIRATORY W GRAM STAIN: Culture: NORMAL

## 2014-01-03 LAB — CULTURE, RESPIRATORY

## 2014-01-03 MED ORDER — LEVOFLOXACIN 750 MG PO TABS: 750.0000 mg | ORAL_TABLET | Freq: Every day | ORAL | Status: DC

## 2014-01-03 NOTE — Discharge Summary (Signed)
Physician Discharge Summary  Emily Moran DQQ:[id number] DOB: 12/26/1955 DOA: 12/30/2013  PCP: Philis Fendt, MD  Admit date: 12/30/2013 Discharge date: 01/03/2014  Recommendations for Outpatient Follow-up:  1. F/U with PCP in 2 weeks to ensure resolution of symptoms. 2. F/U final blood cultures (negative to date).  Discharge Diagnoses:  Principal Problem:    Healthcare-associated pneumonia Active Problems:    HEPATITIS C    TOBACCO ABUSE    SUBSTANCE ABUSE, MULTIPLE    Lethargy    COPD    Elevated BNP     Discharge Condition: Improved.  Diet recommendation: Regular.  History of present illness:  Emily Moran is an 58 y.o. female with PMH of COPD, chronic back pain, polysubstance abuse was admitted 12/30/13 with fever, chills and cough productive of green sputum. She was hospitalized 11/28/12-12/01/13 for treatment of a right femoral intertrochanteric fracture. Her chest x-ray showed possible pneumonia so she was admitted for treatment of HCAP.  Hospital Course by problem:  Principal Problem:  Healthcare-associated pneumonia  Blood cultures sent and are negative to date. Influenza panel negative. Sputum culture grew normal oropharyngeal flora, strep pneumonia negative, Legionella antigen negative and HIV nonreactive.  Initially treated with Cefepime and vancomycin which were D/C'd on 01/01/13 and Levaquin started. Will D/C home on an additional 4 days of therapy with Levaquin. Active Problems:  COPD  Treated with nebulized bronchodilator therapy when necessary. No bronchospasm noted during the course of her hospital stay. Elevated proBNP  No clinical evidence of heart failure. Suspect this was from pulmonary edema in the setting of polysubstance abuse.  HEPATITIS C  Mild AST/ALT elevation noted.  TOBACCO ABUSE  Tobacco cessation counseling provided.  SUBSTANCE ABUSE, MULTIPLE  UDS positive for opiates. We avoided treating the patient with narcotics while in the  hospital. SW consulted to provide community resources/counseling.  Lethargy  Likely secondary to opiate use. Gradually improved.   Procedures:  None  Consultations:  None  Discharge Exam: Filed Vitals:   01/03/14 0525  BP: 118/62  Pulse: 77  Temp: 97.7 F (36.5 C)  Resp: 16   Filed Vitals:   01/02/14 0508 01/02/14 1420 01/02/14 2140 01/03/14 0525  BP: 103/71 105/73 125/65 118/62  Pulse: 74 80 79 77  Temp: 97.7 F (36.5 C) 97.8 F (36.6 C) 98.2 F (36.8 C) 97.7 F (36.5 C)  TempSrc: Oral Oral Oral Oral  Resp: 16 16 16 16   Height:      Weight:      SpO2: 97% 99% 99% 100%    Gen:  NAD Cardiovascular:  RRR, No M/R/G Respiratory: Lungs CTAB Gastrointestinal: Abdomen soft, NT/ND with normal active bowel sounds. Extremities: No C/E/C   Discharge Instructions  Discharge Orders   Future Orders Complete By Expires   Call MD for:  temperature >100.4  As directed    Call MD for:  As directed    Scheduling Instructions:     Worsening shortness of breath.   Diet general  As directed    Discharge instructions  As directed    Comments:     You were cared for by Dr. Jacquelynn Cree  (a hospitalist) during your hospital stay. If you have any questions about your discharge medications or the care you received while you were in the hospital after you are discharged, you can call the unit and ask to speak with the hospitalist on call if the hospitalist that took care of you is not available. Once you are discharged, your primary care  physician will handle any further medical issues. Please note that NO REFILLS for any discharge medications will be authorized once you are discharged, as it is imperative that you return to your primary care physician (or establish a relationship with a primary care physician if you do not have one) for your aftercare needs so that they can reassess your need for medications and monitor your lab values.  Any outstanding tests can be reviewed by your PCP  at your follow up visit.  It is also important to review any medicine changes with your PCP.  Please bring these d/c instructions with you to your next visit so your physician can review these changes with you.  If you do not have a primary care physician, you can call [phone] for a physician referral.  It is highly recommended that you obtain a PCP for hospital follow up.   Increase activity slowly  As directed        Medication List         DULoxetine 60 MG capsule  Commonly known as:  CYMBALTA  Take 60 mg by mouth 2 (two) times daily.     gabapentin 300 MG capsule  Commonly known as:  NEURONTIN  Take 300 mg by mouth 3 (three) times daily.     levofloxacin 750 MG tablet  Commonly known as:  LEVAQUIN  Take 1 tablet (750 mg total) by mouth daily.           Follow-up Information   Follow up with AVBUERE,EDWIN A, MD. Schedule an appointment as soon as possible for a visit in 2 weeks. [facility] follow up.)    Specialty:  Internal Medicine   Contact information:   [place] [address] [phone]        The results of significant diagnostics from this hospitalization (including imaging, microbiology, ancillary and laboratory) are listed below for reference.    Significant Diagnostic Studies: Dg Chest 2 View  12/30/2013   CLINICAL DATA:  COPD, cough  EXAM: CHEST  2 VIEW  COMPARISON:  DG CHEST 2 VIEW dated 06/28/2013; CT ABD/PELV WO CM dated 06/22/2011; DG CHEST 2 VIEW dated 10/29/2011; XR-CHEST 2 VIEWS dated 06/25/2012  FINDINGS: There is severe bilateral bullous emphysema involving the upper lobes. There is bilateral lower lobe airspace disease. No pleural effusion or pneumothorax. The heart and mediastinal contours are unremarkable.  The osseous structures are unremarkable.  IMPRESSION: 1. Bilateral lower lobe airspace disease most concerning for bibasilar pneumonia. 2. Severe bilateral bullous emphysema most severe in the upper lobes.   Electronically Signed    By: Kathreen Devoid   On: 12/30/2013 13:23    Labs:  Basic Metabolic Panel:  Recent Labs Lab 12/30/13 1115 01/02/14 0345  NA 134* 138  K 3.9 3.8  CL 97 103  CO2 24 24  GLUCOSE 94 86  BUN 18 13  CREATININE 0.74 1.05  CALCIUM 9.2 8.7   GFR Estimated Creatinine Clearance: 54.2 ml/min (by C-G formula based on Cr of 1.05). Liver Function Tests:  Recent Labs Lab 12/30/13 1115  AST 121*  ALT 132*  ALKPHOS 112  BILITOT 0.8  PROT 7.7  ALBUMIN 3.7   CBC:  Recent Labs Lab 12/30/13 1115 01/02/14 0345  WBC 7.7 6.3  HGB 12.5 11.9*  HCT 36.3 37.0  MCV 90.3 94.1  PLT 233 246   Microbiology Recent Results (from the past 240 hour(s))  CULTURE, BLOOD (ROUTINE X 2)     Status: None   Collection  Time    12/30/13  8:08 PM      Result Value Ref Range Status   Specimen Description BLOOD LEFT FOREARM   Final   Special Requests BOTTLES DRAWN AEROBIC ONLY 5CC   Final   Culture  Setup Time     Final   Value: 12/31/2013 00:32     Performed at [facility]   Culture     Final   Value:        BLOOD CULTURE RECEIVED NO GROWTH TO DATE CULTURE WILL BE HELD FOR 5 DAYS BEFORE ISSUING A FINAL NEGATIVE REPORT     Performed at [facility]   Report Status PENDING   Incomplete  CULTURE, BLOOD (ROUTINE X 2)     Status: None   Collection Time    12/30/13  8:12 PM      Result Value Ref Range Status   Specimen Description BLOOD RIGHT FOREARM   Final   Special Requests BOTTLES DRAWN AEROBIC AND ANAEROBIC 10CC   Final   Culture  Setup Time     Final   Value: 12/31/2013 00:30     Performed at [facility]   Culture     Final   Value:        BLOOD CULTURE RECEIVED NO GROWTH TO DATE CULTURE WILL BE HELD FOR 5 DAYS BEFORE ISSUING A FINAL NEGATIVE REPORT     Performed at [facility]   Report Status PENDING   Incomplete  CULTURE, EXPECTORATED SPUTUM-ASSESSMENT     Status: None   Collection Time    01/01/14  5:11 AM      Result Value Ref Range Status   Specimen  Description SPUTUM   Final   Special Requests Normal   Final   Sputum evaluation     Final   Value: THIS SPECIMEN IS ACCEPTABLE. RESPIRATORY CULTURE REPORT TO FOLLOW.   Report Status 01/01/2014 FINAL   Final  CULTURE, RESPIRATORY (NON-EXPECTORATED)     Status: None   Collection Time    01/01/14  5:11 AM      Result Value Ref Range Status   Specimen Description SPUTUM   Final   Special Requests NONE   Final   Gram Stain     Final   Value: MODERATE WBC PRESENT,BOTH PMN AND MONONUCLEAR     MODERATE SQUAMOUS EPITHELIAL CELLS PRESENT     FEW GRAM POSITIVE COCCI IN PAIRS     Performed at [facility]   Culture     Final   Value: NORMAL OROPHARYNGEAL FLORA     Performed at [facility]   Report Status PENDING   Incomplete    Time coordinating discharge: 35 minutes.  Signed:  Deronte Solis  Pager [phone] [facility] 01/03/2014, 8:32 AM

## 2014-01-03 NOTE — Progress Notes (Signed)
01/03/14 0940  Reviewed discharge instructions with patients. Patient verbalized understanding of discharge instructions. Copy of discharge given to patient.

## 2014-01-03 NOTE — Discharge Instructions (Signed)
Nicotine Addiction Nicotine can act as both a stimulant (excites/activates) and a sedative (calms/quiets). Immediately after exposure to nicotine, there is a "kick" caused in part by the drug's stimulation of the adrenal glands and resulting discharge of adrenaline (epinephrine). The rush of adrenaline stimulates the body and causes a sudden release of sugar. This means that smokers are always slightly hyperglycemic. Hyperglycemic means that the blood sugar is high, just like in diabetics. Nicotine also decreases the amount of insulin which helps control sugar levels in the body. There is an increase in blood pressure, breathing, and the rate of heart beats.  In addition, nicotine indirectly causes a release of dopamine in the brain that controls pleasure and motivation. A similar reaction is seen with other drugs of abuse, such as cocaine and heroin. This dopamine release is thought to cause the pleasurable sensations when smoking. In some different cases, nicotine can also create a calming effect, depending on sensitivity of the smoker's nervous system and the dose of nicotine taken. WHAT HAPPENS WHEN NICOTINE IS TAKEN FOR LONG PERIODS OF TIME?  Long-term use of nicotine results in addiction. It is difficult to stop.  Repeated use of nicotine creates tolerance. Higher doses of nicotine are needed to get the "kick." When nicotine use is stopped, withdrawal may last a month or more. Withdrawal may begin within a few hours after the last cigarette. Symptoms peak within the first few days and may lessen within a few weeks. For some people, however, symptoms may last for months or longer. Withdrawal symptoms include:   Irritability.  Craving.  Learning and attention deficits.  Sleep disturbances.  Increased appetite. Craving for tobacco may last for 6 months or longer. Many behaviors done while using nicotine can also play a part in the severity of withdrawal symptoms. For some people, the feel,  smell, and sight of a cigarette and the ritual of obtaining, handling, lighting, and smoking the cigarette are closely linked with the pleasure of smoking. When stopped, they also miss the related behaviors which make the withdrawal or craving worse. While nicotine gum and patches may lessen the drug aspects of withdrawal, cravings often persist. WHAT ARE THE MEDICAL CONSEQUENCES OF NICOTINE USE?  Nicotine addiction accounts for one-third of all cancers. The top cancer caused by tobacco is lung cancer. Lung cancer is the number one cancer killer of both men and women.  Smoking is also associated with cancers of the:  Mouth.  Pharynx.  Larynx.  Esophagus.  Stomach.  Pancreas.  Cervix.  Kidney.  Ureter.  Bladder.  Smoking also causes lung diseases such as lasting (chronic) bronchitis and emphysema.  It worsens asthma in adults and children.  Smoking increases the risk of heart disease, including:  Stroke.  Heart attack.  Vascular disease.  Aneurysm.  Passive or secondary smoke can also increase medical risks including:  Asthma in children.  Sudden Infant Death Syndrome (SIDS).  Additionally, dropped cigarettes are the leading cause of residential fire fatalities.  Nicotine poisoning has been reported from accidental ingestion of tobacco products by children and pets. Death usually results in a few minutes from respiratory failure (when a person stops breathing) caused by paralysis. TREATMENT   Medication. Nicotine replacement medicines such as nicotine gum and the patch are used to stop smoking. These medicines gradually lower the dosage of nicotine in the body. These medicines do not contain the carbon monoxide and other toxins found in tobacco smoke.  Hypnotherapy.  Relaxation therapy.  Nicotine Anonymous (a 12-step support  program). Find times and locations in your local yellow pages. Document Released: 05/30/2004 Document Revised: 12/17/2011 Document  Reviewed: 10/22/2007 [facility] Patient Information 2014 [place].  Pneumonia, Adult Pneumonia is an infection of the lungs.  CAUSES Pneumonia may be caused by bacteria or a virus. Usually, these infections are caused by breathing infectious particles into the lungs (respiratory tract). SYMPTOMS   Cough.  Fever.  Chest pain.  Increased rate of breathing.  Wheezing.  Mucus production. DIAGNOSIS  If you have the common symptoms of pneumonia, your caregiver will typically confirm the diagnosis with a chest X-ray. The X-ray will show an abnormality in the lung (pulmonary infiltrate) if you have pneumonia. Other tests of your blood, urine, or sputum may be done to find the specific cause of your pneumonia. Your caregiver may also do tests (blood gases or pulse oximetry) to see how well your lungs are working. TREATMENT  Some forms of pneumonia may be spread to other people when you cough or sneeze. You may be asked to wear a mask before and during your exam. Pneumonia that is caused by bacteria is treated with antibiotic medicine. Pneumonia that is caused by the influenza virus may be treated with an antiviral medicine. Most other viral infections must run their course. These infections will not respond to antibiotics.  PREVENTION A pneumococcal shot (vaccine) is available to prevent a common bacterial cause of pneumonia. This is usually suggested for:  People over 18 years old.  Patients on chemotherapy.  People with chronic lung problems, such as bronchitis or emphysema.  People with immune system problems. If you are over 65 or have a high risk condition, you may receive the pneumococcal vaccine if you have not received it before. In some countries, a routine influenza vaccine is also recommended. This vaccine can help prevent some cases of pneumonia.You may be offered the influenza vaccine as part of your care. If you smoke, it is time to quit. You may receive instructions on  how to stop smoking. Your caregiver can provide medicines and counseling to help you quit. HOME CARE INSTRUCTIONS   Cough suppressants may be used if you are losing too much rest. However, coughing protects you by clearing your lungs. You should avoid using cough suppressants if you can.  Your caregiver may have prescribed medicine if he or she thinks your pneumonia is caused by a bacteria or influenza. Finish your medicine even if you start to feel better.  Your caregiver may also prescribe an expectorant. This loosens the mucus to be coughed up.  Only take over-the-counter or prescription medicines for pain, discomfort, or fever as directed by your caregiver.  Do not smoke. Smoking is a common cause of bronchitis and can contribute to pneumonia. If you are a smoker and continue to smoke, your cough may last several weeks after your pneumonia has cleared.  A cold steam vaporizer or humidifier in your room or home may help loosen mucus.  Coughing is often worse at night. Sleeping in a semi-upright position in a recliner or using a couple pillows under your head will help with this.  Get rest as you feel it is needed. Your body will usually let you know when you need to rest. SEEK IMMEDIATE MEDICAL CARE IF:   Your illness becomes worse. This is especially true if you are elderly or weakened from any other disease.  You cannot control your cough with suppressants and are losing sleep.  You begin coughing up blood.  You develop  pain which is getting worse or is uncontrolled with medicines.  You have a fever.  Any of the symptoms which initially brought you in for treatment are getting worse rather than better.  You develop shortness of breath or chest pain. MAKE SURE YOU:   Understand these instructions.  Will watch your condition.  Will get help right away if you are not doing well or get worse. Document Released: 09/24/2005 Document Revised: 12/17/2011 Document Reviewed:  12/14/2010 [facility] Patient Information 2014 [place], [place].

## 2014-01-06 LAB — CULTURE, BLOOD (ROUTINE X 2)
Culture: NO GROWTH
Culture: NO GROWTH

## 2014-05-06 ENCOUNTER — Emergency Department (HOSPITAL_COMMUNITY): Payer: Medicare Other

## 2014-05-06 ENCOUNTER — Encounter (HOSPITAL_COMMUNITY): Payer: Self-pay | Admitting: Emergency Medicine

## 2014-05-06 ENCOUNTER — Observation Stay (HOSPITAL_COMMUNITY)
Admission: EM | Admit: 2014-05-06 | Discharge: 2014-05-07 | Disposition: A | Discharge status: Home / Self Care | Payer: Medicare Other | Attending: Emergency Medicine | Admitting: Emergency Medicine

## 2014-05-06 DIAGNOSIS — J449 Chronic obstructive pulmonary disease, unspecified: Secondary | ICD-10-CM

## 2014-05-06 DIAGNOSIS — R071 Chest pain on breathing: Secondary | ICD-10-CM | POA: Diagnosis not present

## 2014-05-06 DIAGNOSIS — J441 Chronic obstructive pulmonary disease with (acute) exacerbation: Secondary | ICD-10-CM | POA: Diagnosis not present

## 2014-05-06 DIAGNOSIS — I10 Essential (primary) hypertension: Secondary | ICD-10-CM | POA: Insufficient documentation

## 2014-05-06 DIAGNOSIS — Z79899 Other long term (current) drug therapy: Secondary | ICD-10-CM | POA: Diagnosis not present

## 2014-05-06 DIAGNOSIS — Z8739 Personal history of other diseases of the musculoskeletal system and connective tissue: Secondary | ICD-10-CM | POA: Diagnosis not present

## 2014-05-06 DIAGNOSIS — R079 Chest pain, unspecified: Secondary | ICD-10-CM | POA: Diagnosis present

## 2014-05-06 DIAGNOSIS — Z886 Allergy status to analgesic agent status: Secondary | ICD-10-CM | POA: Insufficient documentation

## 2014-05-06 DIAGNOSIS — J4489 Other specified chronic obstructive pulmonary disease: Secondary | ICD-10-CM

## 2014-05-06 DIAGNOSIS — F1910 Other psychoactive substance abuse, uncomplicated: Secondary | ICD-10-CM | POA: Diagnosis present

## 2014-05-06 DIAGNOSIS — F17200 Nicotine dependence, unspecified, uncomplicated: Secondary | ICD-10-CM | POA: Diagnosis not present

## 2014-05-06 LAB — COMPREHENSIVE METABOLIC PANEL
ALT: 32 U/L (ref 0–35)
AST: 32 U/L (ref 0–37)
Albumin: 4.1 g/dL (ref 3.5–5.2)
Alkaline Phosphatase: 145 U/L — ABNORMAL HIGH (ref 39–117)
Anion gap: 12 (ref 5–15)
BUN: 18 mg/dL (ref 6–23)
CO2: 23 mEq/L (ref 19–32)
Calcium: 9.2 mg/dL (ref 8.4–10.5)
Chloride: 105 mEq/L (ref 96–112)
Creatinine, Ser: 1.07 mg/dL (ref 0.50–1.10)
GFR calc Af Amer: 66 mL/min — ABNORMAL LOW (ref >90)
GFR calc non Af Amer: 57 mL/min — ABNORMAL LOW (ref >90)
Glucose, Bld: 75 mg/dL (ref 70–99)
Potassium: 4.9 mEq/L (ref 3.7–5.3)
Sodium: 140 mEq/L (ref 137–147)
Total Bilirubin: 0.3 mg/dL (ref 0.3–1.2)
Total Protein: 8.2 g/dL (ref 6.0–8.3)

## 2014-05-06 LAB — CBC WITH DIFFERENTIAL/PLATELET
BASOS ABS: 0.1 10*3/uL (ref 0.0–0.1)
BASOS PCT: 1 % (ref 0–1)
EOS ABS: 0.3 10*3/uL (ref 0.0–0.7)
Eosinophils Relative: 3 % (ref 0–5)
HCT: 41.5 % (ref 36.0–46.0)
Hemoglobin: 13.9 g/dL (ref 12.0–15.0)
Lymphocytes Relative: 25 % (ref 12–46)
Lymphs Abs: 2.6 10*3/uL (ref 0.7–4.0)
MCH: 31.2 pg (ref 26.0–34.0)
MCHC: 33.5 g/dL (ref 30.0–36.0)
MCV: 93 fL (ref 78.0–100.0)
Monocytes Absolute: 0.8 10*3/uL (ref 0.1–1.0)
Monocytes Relative: 8 % (ref 3–12)
NEUTROS ABS: 6.5 10*3/uL (ref 1.7–7.7)
NEUTROS PCT: 63 % (ref 43–77)
PLATELETS: 259 10*3/uL (ref 150–400)
RBC: 4.46 MIL/uL (ref 3.87–5.11)
RDW: 14.2 % (ref 11.5–15.5)
WBC: 10.2 10*3/uL (ref 4.0–10.5)

## 2014-05-06 LAB — TROPONIN I: Troponin I: <0.3 ng/mL (ref <0.30)

## 2014-05-06 LAB — D-DIMER, QUANTITATIVE: D-Dimer, Quant: 1.17 ug/mL-FEU — ABNORMAL HIGH (ref 0.00–0.48)

## 2014-05-06 LAB — PRO B NATRIURETIC PEPTIDE: Pro B Natriuretic peptide (BNP): 131.3 pg/mL — ABNORMAL HIGH (ref 0–125)

## 2014-05-06 MED ORDER — MORPHINE SULFATE 4 MG/ML IJ SOLN
4.0000 mg | Freq: Once | INTRAMUSCULAR | Status: AC
  Administered 2014-05-06: 4 mg via INTRAVENOUS

## 2014-05-06 MED ORDER — MORPHINE SULFATE 4 MG/ML IJ SOLN
INTRAMUSCULAR | Status: AC
  Filled 2014-05-06: qty 1

## 2014-05-06 MED ORDER — SODIUM CHLORIDE 0.9 % IV BOLUS (SEPSIS)
500.0000 mL | Freq: Once | INTRAVENOUS | Status: AC
  Administered 2014-05-06: 500 mL via INTRAVENOUS

## 2014-05-06 MED ORDER — FUROSEMIDE 10 MG/ML IJ SOLN
40.0000 mg | Freq: Once | INTRAMUSCULAR | Status: AC
Start: 2014-05-06 — End: 2014-05-06
  Administered 2014-05-06: 40 mg via INTRAVENOUS
  Filled 2014-05-06: qty 4

## 2014-05-06 MED ORDER — MORPHINE SULFATE 4 MG/ML IJ SOLN
4.0000 mg | Freq: Once | INTRAMUSCULAR | Status: AC
  Administered 2014-05-06: 4 mg via INTRAVENOUS
  Filled 2014-05-06: qty 1

## 2014-05-06 NOTE — ED Notes (Signed)
Sleeping soundly

## 2014-05-06 NOTE — ED Provider Notes (Signed)
CSN: [id number]     Arrival date & time 05/06/14  1715 History  This chart was scribed for Nat Christen, MD by Peyton Bottoms, ED Scribe. This patient was seen in room APA17/APA17 and the patient's care was started at 5:29 PM.  Chief Complaint  Patient presents with  . Chest Pain   The history is provided by the patient. No language interpreter was used.    HPI Comments: Emily Moran is a 58 y.o. female who is a smoker with a prior history of COPD and DDD, who presents to the Emergency Department complaining of chest pain under her right breast onset yesterday morning at 8AM. Patient describes pain as "tightness.".  Patient reports associated fever, clamminess, and SOB that is more than usual.  Patient denies having a cough, or producing sputum and states she feels "dry" in her lungs.  She states she is slightly more winded when walking around her house than usual.  Pt admits to prior h/o similar pain due to "bronchitis or pneumonia."  Patient states she has no prior history of heart problems.  She reports she has arthritis in her back and neck.  Patient smokes 10-15 cigarettes/day.   Past Medical History  Diagnosis Date  . COPD (chronic obstructive pulmonary disease)   . Degenerative disc disease   . Spinal stenosis   . DDD (degenerative disc disease)   . DDD (degenerative disc disease)   . Hypertension   . Degenerative disc disease    Past Surgical History  Procedure Laterality Date  . Neck surgery    . Abdominal hysterectomy    . Bladder surgery    . Femur im nail Right 11/28/2012    Procedure: INTRAMEDULLARY (IM) RETROGRADE FEMORAL NAILING wants jackson table , c-arm and biomet ;  Surgeon: Mauri Pole, MD;  Location: [facility] ORS;  Service: Orthopedics;  Laterality: Right;   History reviewed. No pertinent family history. History  Substance Use Topics  . Smoking status: Current Every Day Smoker -- 0.50 packs/day    Types: Cigarettes  . Smokeless tobacco: Never Used  . Alcohol  Use: No     Comment: History of ETOH abuse   OB History   Grav Para Term Preterm Abortions TAB SAB Ect Mult Living                 Review of Systems  A complete 10 system review of systems was obtained and all systems are negative except as noted in the HPI and PMH.    Allergies  Aspirin  Home Medications   Prior to Admission medications   Medication Sig Start Date End Date Taking? Authorizing Provider  DULoxetine (CYMBALTA) 60 MG capsule Take 60 mg by mouth 2 (two) times daily.   Yes Historical Provider, MD  gabapentin (NEURONTIN) 400 MG capsule Take 400 mg by mouth 3 (three) times daily.   Yes Historical Provider, MD   Triage Vitals: BP 138/109  Pulse 73  Temp(Src) 98.2 F (36.8 C) (Oral)  Resp 14  Ht 5\' 8"  (1.727 m)  Wt 130 lb (58.968 kg)  BMI 19.77 kg/m2  SpO2 98% Physical Exam  Nursing note and vitals reviewed. Constitutional: She is oriented to person, place, and time. She appears well-developed and well-nourished.  HENT:  Head: Normocephalic and atraumatic.  Eyes: Conjunctivae and EOM are normal. Pupils are equal, round, and reactive to light.  Neck: Normal range of motion. Neck supple.  Cardiovascular: Normal rate, regular rhythm and normal heart sounds.   Pulmonary/Chest:  Effort normal and breath sounds normal. She exhibits tenderness.  Right anterior chest wall tenderness  Abdominal: Soft. Bowel sounds are normal.  Musculoskeletal: Normal range of motion.  Neurological: She is alert and oriented to person, place, and time.  Skin: Skin is warm and dry.  Psychiatric: She has a normal mood and affect. Her behavior is normal.    ED Course  Procedures (including critical care time)  DIAGNOSTIC STUDIES: Oxygen Saturation is 98% on RA, normal by my interpretation.    COORDINATION OF CARE: 5:32 PM- Discussed plan to order CXR, and EKG. Pt advised of plan for treatment and pt agrees.  Results for orders placed during the hospital encounter of 05/06/14   COMPREHENSIVE METABOLIC PANEL      Result Value Ref Range   Sodium 140  137 - 147 mEq/L   Potassium 4.9  3.7 - 5.3 mEq/L   Chloride 105  96 - 112 mEq/L   CO2 23  19 - 32 mEq/L   Glucose, Bld 75  70 - 99 mg/dL   BUN 18  6 - 23 mg/dL   Creatinine, Ser 1.07  0.50 - 1.10 mg/dL   Calcium 9.2  8.4 - 10.5 mg/dL   Total Protein 8.2  6.0 - 8.3 g/dL   Albumin 4.1  3.5 - 5.2 g/dL   AST 32  0 - 37 U/L   ALT 32  0 - 35 U/L   Alkaline Phosphatase 145 (*) 39 - 117 U/L   Total Bilirubin 0.3  0.3 - 1.2 mg/dL   GFR calc non Af Amer 57 (*) >90 mL/min   GFR calc Af Amer 66 (*) >90 mL/min   Anion gap 12  5 - 15  CBC WITH DIFFERENTIAL      Result Value Ref Range   WBC 10.2  4.0 - 10.5 K/uL   RBC 4.46  3.87 - 5.11 MIL/uL   Hemoglobin 13.9  12.0 - 15.0 g/dL   HCT 41.5  36.0 - 46.0 %   MCV 93.0  78.0 - 100.0 fL   MCH 31.2  26.0 - 34.0 pg   MCHC 33.5  30.0 - 36.0 g/dL   RDW 14.2  11.5 - 15.5 %   Platelets 259  150 - 400 K/uL   Neutrophils Relative % 63  43 - 77 %   Neutro Abs 6.5  1.7 - 7.7 K/uL   Lymphocytes Relative 25  12 - 46 %   Lymphs Abs 2.6  0.7 - 4.0 K/uL   Monocytes Relative 8  3 - 12 %   Monocytes Absolute 0.8  0.1 - 1.0 K/uL   Eosinophils Relative 3  0 - 5 %   Eosinophils Absolute 0.3  0.0 - 0.7 K/uL   Basophils Relative 1  0 - 1 %   Basophils Absolute 0.1  0.0 - 0.1 K/uL  TROPONIN I      Result Value Ref Range   Troponin I <0.30  <0.30 ng/mL  PRO B NATRIURETIC PEPTIDE      Result Value Ref Range   Pro B Natriuretic peptide (BNP) 131.3 (*) 0 - 125 pg/mL  D-DIMER, QUANTITATIVE      Result Value Ref Range   D-Dimer, Quant 1.17 (*) 0.00 - 0.48 ug/mL-FEU   Dg Chest 2 View  05/06/2014   CLINICAL DATA:  Chest pain and difficulty breathing  EXAM: CHEST  2 VIEW  COMPARISON:  December 30, 2013  FINDINGS: There is underlying emphysematous change. There is extensive bullous disease  in the right upper lobe. There are scattered areas of scarring. There is interstitial prominence in both lower  lung zones, in part due to redistribution of blood flow to viable segments of lung. There is, however, felt to be a degree of underlying interstitial edema. The heart is mildly enlarged. The pulmonary vascularity reflects underlying emphysema. No adenopathy. There is postoperative change in the lower cervical spine.  IMPRESSION: Suspect a degree of congestive heart failure superimposed on emphysematous change. No airspace consolidation.   Electronically Signed   By: Lowella Grip M.D.   On: 05/06/2014 18:48       EKG Interpretation   Date/Time:  Thursday May 06 2014 17:23:41 EDT Ventricular Rate:  74 PR Interval:  131 QRS Duration: 96 QT Interval:  406 QTC Calculation: 450 R Axis:   78 Text Interpretation:  Sinus rhythm Consider left atrial enlargement  Probable anteroseptal infarct, old Confirmed by Charne Mcbrien  MD, Johnda Billiot ([id number]) on  05/06/2014 6:53:46 PM      MDM   Final diagnoses:  Chest pain, unspecified chest pain type  COPD exacerbation    Uncertain etiology of right-sided chest pain. Could be cardiac. Patient has COPD. D-dimer elevated. CT angio chest pending. IV Lasix given. Discussed with Dr. Darrick Meigs.  Admit to telemetry.  I personally performed the services described in this documentation, which was scribed in my presence. The recorded information has been reviewed and is accurate.    Nat Christen, MD 05/06/14 [phone]

## 2014-05-06 NOTE — H&P (Addendum)
PCP:   Philis Fendt, MD   Chief Complaint:  Chest pain  HPI: [age]-year-old female who   has a past medical history of COPD (chronic obstructive pulmonary disease); Degenerative disc disease; Spinal stenosis; DDD (degenerative disc disease); DDD (degenerative disc disease); Hypertension; and Degenerative disc disease. Presents to the ED after patient had developed right-sided chest pain under the right breast since yesterday morning. Patient describes the pain ass and also has been having shortness of breath with with a feeling that she has to put extra effort to speak, she denies any cough. The pain she describes as tightness, 6/10 in intensity, intermittent .Patient is a smoker and smokes half pack cigarettes per day. No history of heart problems. No recent long travel. No fever no nausea vomiting or diarrhea. In the ED patient was found to have elevated d-dimer, CT and has been ordered to rule out pulmonary embolism. Patient is not requiring oxygen and her O2 sats are greater than 92%. Cardiac enzymes are negative in the ED.  Allergies:   Allergies  Allergen Reactions  . Aspirin     Upset stomach      Past Medical History  Diagnosis Date  . COPD (chronic obstructive pulmonary disease)   . Degenerative disc disease   . Spinal stenosis   . DDD (degenerative disc disease)   . DDD (degenerative disc disease)   . Hypertension   . Degenerative disc disease     Past Surgical History  Procedure Laterality Date  . Neck surgery    . Abdominal hysterectomy    . Bladder surgery    . Femur im nail Right 11/28/2012    Procedure: INTRAMEDULLARY (IM) RETROGRADE FEMORAL NAILING wants jackson table , c-arm and biomet ;  Surgeon: Mauri Pole, MD;  Location: [facility];  Service: Orthopedics;  Laterality: Right;    Prior to Admission medications   Medication Sig Start Date End Date Taking? Authorizing Provider  DULoxetine (CYMBALTA) 60 MG capsule Take 60 mg by mouth 2 (two) times  daily.   Yes Historical Provider, MD  gabapentin (NEURONTIN) 400 MG capsule Take 400 mg by mouth 3 (three) times daily.   Yes Historical Provider, MD    Social History:  reports that she has been smoking Cigarettes.  She has been smoking about 0.50 packs per day. She has never used smokeless tobacco. She reports that she does not drink alcohol or use illicit drugs.  Family history Patient's father had MI at the age of 8  All the positives are listed in BOLD  Review of Systems:  HEENT: Headache, blurred vision, runny nose, sore throat Neck: Hypothyroidism, hyperthyroidism,,lymphadenopathy Chest : Shortness of breath, history of COPD, Asthma Heart : Chest pain, history of coronary arterey disease GI:  Nausea, vomiting, diarrhea, constipation, GERD GU: Dysuria, urgency, frequency of urination, hematuria Neuro: Stroke, seizures, syncope Psych: Depression, anxiety, hallucinations   Physical Exam: Blood pressure 130/88, pulse 70, temperature 98.2 F (36.8 C), temperature source Oral, resp. rate 20, height 5\' 8"  (1.727 m), weight 58.968 kg (130 lb), SpO2 94.00%. Constitutional:   Patient is a well-developed and well-nourished * in no acute distress and cooperative with exam. Head: Normocephalic and atraumatic Mouth: Mucus membranes moist Eyes: PERRL, EOMI, conjunctivae normal Neck: Supple, No Thyromegaly Cardiovascular: RRR, S1 normal, S2 normal Pulmonary/Chest: CTAB, no wheezes, rales, or rhonchi Abdominal: Soft. Non-tender, non-distended, bowel sounds are normal, no masses, organomegaly, or guarding present.  Neurological: A&O x3, Strenght is normal and symmetric bilaterally, cranial nerve II-XII  are grossly intact, no focal motor deficit, sensory intact to light touch bilaterally.  Extremities : No Cyanosis, Clubbing or Edema  Labs on Admission:  Basic Metabolic Panel:  Recent Labs Lab 05/06/14 1909  NA 140  K 4.9  CL 105  CO2 23  GLUCOSE 75  BUN 18  CREATININE 1.07    CALCIUM 9.2   Liver Function Tests:  Recent Labs Lab 05/06/14 1909  AST 32  ALT 32  ALKPHOS 145*  BILITOT 0.3  PROT 8.2  ALBUMIN 4.1   No results found for this basename: LIPASE, AMYLASE,  in the last 168 hours No results found for this basename: AMMONIA,  in the last 168 hours CBC:  Recent Labs Lab 05/06/14 1909  WBC 10.2  NEUTROABS 6.5  HGB 13.9  HCT 41.5  MCV 93.0  PLT 259   Cardiac Enzymes:  Recent Labs Lab 05/06/14 1909  TROPONINI <0.30    BNP (last 3 results)  Recent Labs  12/30/13 1115 05/06/14 1909  PROBNP 1094.0* 131.3*   CBG: No results found for this basename: GLUCAP,  in the last 168 hours  Radiological Exams on Admission: Dg Chest 2 View  05/06/2014   CLINICAL DATA:  Chest pain and difficulty breathing  EXAM: CHEST  2 VIEW  COMPARISON:  December 30, 2013  FINDINGS: There is underlying emphysematous change. There is extensive bullous disease in the right upper lobe. There are scattered areas of scarring. There is interstitial prominence in both lower lung zones, in part due to redistribution of blood flow to viable segments of lung. There is, however, felt to be a degree of underlying interstitial edema. The heart is mildly enlarged. The pulmonary vascularity reflects underlying emphysema. No adenopathy. There is postoperative change in the lower cervical spine.  IMPRESSION: Suspect a degree of congestive heart failure superimposed on emphysematous change. No airspace consolidation.   Electronically Signed   By: Lowella Grip M.D.   On: 05/06/2014 18:48    EKG: Independently reviewed. Normal sinus rhythm   Assessment/Plan Principal Problem:   Chest pain Active Problems:   SUBSTANCE ABUSE, MULTIPLE   COPD  Chest pain We'll admit the patient to rule out acute coronary syndrome. Cardiac enzymes are negative in the ED with cycle the troponin I. every 6 hours. D-dimer has been elevated, so CT angiogram has been ordered to rule out pulmonary  embolism.  Acute bronchitis Patient is displaying signs and symptoms of acute bronchitis, she does not have wheezing. We'll start the patient on Solu-Medrol, Levaquin, DuoNeb nebulizers every 6 hours, Mucinex DM one tablet by mouth twice a day.  Chronic back pain , history of Substance abuse Patient goes to methadone clinic, and gets methadone 78 mg every day. I put a pharmacy consult, to confirm the dose, consider starting  methadone once confirmed.      Code status: Patient is full code  Family discussion: No family at bedside.   Time Spent on Admission: 60 minutes  [facility] Pager: [phone] 05/06/2014, 11:16 PM  If 7PM-7AM, please contact night-coverage  [email or web address]  Password [email or web address]

## 2014-05-06 NOTE — ED Notes (Signed)
Started with chest pain about 8am.  Rates pain 9.  Denies self mediating.  Took ASA 325mg   (3 tab)  last night.

## 2014-05-07 ENCOUNTER — Encounter (HOSPITAL_COMMUNITY): Payer: Self-pay | Admitting: *Deleted

## 2014-05-07 DIAGNOSIS — F17200 Nicotine dependence, unspecified, uncomplicated: Secondary | ICD-10-CM

## 2014-05-07 DIAGNOSIS — I509 Heart failure, unspecified: Secondary | ICD-10-CM

## 2014-05-07 DIAGNOSIS — R071 Chest pain on breathing: Secondary | ICD-10-CM | POA: Diagnosis not present

## 2014-05-07 LAB — CBC
HCT: 43.8 % (ref 36.0–46.0)
Hemoglobin: 15 g/dL (ref 12.0–15.0)
MCH: 31.6 pg (ref 26.0–34.0)
MCHC: 34.2 g/dL (ref 30.0–36.0)
MCV: 92.2 fL (ref 78.0–100.0)
Platelets: 245 10*3/uL (ref 150–400)
RBC: 4.75 MIL/uL (ref 3.87–5.11)
RDW: 14.2 % (ref 11.5–15.5)
WBC: 10.4 10*3/uL (ref 4.0–10.5)

## 2014-05-07 LAB — TROPONIN I
Troponin I: <0.3 ng/mL (ref <0.30)
Troponin I: <0.3 ng/mL (ref <0.30)
Troponin I: <0.3 ng/mL (ref <0.30)

## 2014-05-07 LAB — COMPREHENSIVE METABOLIC PANEL
ALT: 31 U/L (ref 0–35)
AST: 32 U/L (ref 0–37)
Albumin: 4.1 g/dL (ref 3.5–5.2)
Alkaline Phosphatase: 153 U/L — ABNORMAL HIGH (ref 39–117)
Anion gap: 13 (ref 5–15)
BILIRUBIN TOTAL: 0.3 mg/dL (ref 0.3–1.2)
BUN: 20 mg/dL (ref 6–23)
CO2: 24 meq/L (ref 19–32)
Calcium: 9.7 mg/dL (ref 8.4–10.5)
Chloride: 99 mEq/L (ref 96–112)
Creatinine, Ser: 1.07 mg/dL (ref 0.50–1.10)
GFR calc Af Amer: 66 mL/min — ABNORMAL LOW (ref >90)
GFR, EST NON AFRICAN AMERICAN: 57 mL/min — AB (ref >90)
Glucose, Bld: 124 mg/dL — ABNORMAL HIGH (ref 70–99)
POTASSIUM: 4.3 meq/L (ref 3.7–5.3)
SODIUM: 136 meq/L — AB (ref 137–147)
Total Protein: 8.3 g/dL (ref 6.0–8.3)

## 2014-05-07 MED ORDER — PNEUMOCOCCAL VAC POLYVALENT 25 MCG/0.5ML IJ INJ
0.5000 mL | INJECTION | INTRAMUSCULAR | Status: DC
  Filled 2014-05-07: qty 0.5

## 2014-05-07 MED ORDER — IPRATROPIUM-ALBUTEROL 0.5-2.5 (3) MG/3ML IN SOLN
3.0000 mL | Freq: Four times a day (QID) | RESPIRATORY_TRACT | Status: DC
  Filled 2014-05-07: qty 3

## 2014-05-07 MED ORDER — ALBUTEROL SULFATE (2.5 MG/3ML) 0.083% IN NEBU: 2.5000 mg | INHALATION_SOLUTION | Freq: Four times a day (QID) | RESPIRATORY_TRACT | Status: DC | PRN

## 2014-05-07 MED ORDER — ENOXAPARIN SODIUM 40 MG/0.4ML ~~LOC~~ SOLN
40.0000 mg | SUBCUTANEOUS | Status: DC
  Administered 2014-05-07: 40 mg via SUBCUTANEOUS
  Filled 2014-05-07: qty 0.4

## 2014-05-07 MED ORDER — TIOTROPIUM BROMIDE MONOHYDRATE 18 MCG IN CAPS: 18.0000 ug | ORAL_CAPSULE | Freq: Every day | RESPIRATORY_TRACT | Status: DC

## 2014-05-07 MED ORDER — LEVOFLOXACIN IN D5W 500 MG/100ML IV SOLN
500.0000 mg | INTRAVENOUS | Status: DC
  Administered 2014-05-07: 500 mg via INTRAVENOUS
  Filled 2014-05-07 (×4): qty 100

## 2014-05-07 MED ORDER — PREDNISONE 10 MG PO TABS: ORAL_TABLET | ORAL | Status: DC

## 2014-05-07 MED ORDER — DULOXETINE HCL 60 MG PO CPEP
60.0000 mg | ORAL_CAPSULE | Freq: Two times a day (BID) | ORAL | Status: DC
  Administered 2014-05-07 (×2): 60 mg via ORAL
  Filled 2014-05-07 (×2): qty 1

## 2014-05-07 MED ORDER — NICOTINE POLACRILEX 2 MG MT GUM: 2.0000 mg | CHEWING_GUM | OROMUCOSAL | Status: DC | PRN

## 2014-05-07 MED ORDER — IOHEXOL 350 MG/ML SOLN
100.0000 mL | Freq: Once | INTRAVENOUS | Status: AC | PRN
  Administered 2014-05-07: 100 mL via INTRAVENOUS

## 2014-05-07 MED ORDER — GABAPENTIN 400 MG PO CAPS
400.0000 mg | ORAL_CAPSULE | Freq: Three times a day (TID) | ORAL | Status: DC
  Administered 2014-05-07: 400 mg via ORAL
  Filled 2014-05-07: qty 1

## 2014-05-07 MED ORDER — ONDANSETRON HCL 4 MG PO TABS: 4.0000 mg | ORAL_TABLET | Freq: Four times a day (QID) | ORAL | Status: DC | PRN

## 2014-05-07 MED ORDER — ACETAMINOPHEN 325 MG PO TABS: 650.0000 mg | ORAL_TABLET | Freq: Four times a day (QID) | ORAL | Status: DC | PRN

## 2014-05-07 MED ORDER — LEVOFLOXACIN 500 MG PO TABS: 500.0000 mg | ORAL_TABLET | Freq: Every day | ORAL | Status: DC

## 2014-05-07 MED ORDER — ACETAMINOPHEN 650 MG RE SUPP: 650.0000 mg | Freq: Four times a day (QID) | RECTAL | Status: DC | PRN

## 2014-05-07 MED ORDER — SODIUM CHLORIDE 0.9 % IJ SOLN
3.0000 mL | Freq: Two times a day (BID) | INTRAMUSCULAR | Status: DC
  Administered 2014-05-07: 3 mL via INTRAVENOUS

## 2014-05-07 MED ORDER — METHADONE HCL 10 MG PO TABS
78.0000 mg | ORAL_TABLET | Freq: Every day | ORAL | Status: DC
  Administered 2014-05-07: 80 mg via ORAL
  Filled 2014-05-07: qty 8

## 2014-05-07 MED ORDER — IPRATROPIUM-ALBUTEROL 0.5-2.5 (3) MG/3ML IN SOLN: 3.0000 mL | Freq: Four times a day (QID) | RESPIRATORY_TRACT | Status: DC | PRN

## 2014-05-07 MED ORDER — METHYLPREDNISOLONE SODIUM SUCC 125 MG IJ SOLR
60.0000 mg | Freq: Four times a day (QID) | INTRAMUSCULAR | Status: DC
  Administered 2014-05-07 (×3): 60 mg via INTRAVENOUS
  Filled 2014-05-07 (×4): qty 2

## 2014-05-07 MED ORDER — FLUTICASONE-SALMETEROL 250-50 MCG/DOSE IN AEPB: 1.0000 | INHALATION_SPRAY | Freq: Two times a day (BID) | RESPIRATORY_TRACT | Status: DC

## 2014-05-07 MED ORDER — SODIUM CHLORIDE 0.9 % IV SOLN: 250.0000 mL | INTRAVENOUS | Status: DC | PRN

## 2014-05-07 MED ORDER — SODIUM CHLORIDE 0.9 % IJ SOLN
3.0000 mL | Freq: Two times a day (BID) | INTRAMUSCULAR | Status: DC
  Administered 2014-05-07 (×2): 3 mL via INTRAVENOUS

## 2014-05-07 MED ORDER — ONDANSETRON HCL 4 MG/2ML IJ SOLN: 4.0000 mg | Freq: Four times a day (QID) | INTRAMUSCULAR | Status: DC | PRN

## 2014-05-07 MED ORDER — SODIUM CHLORIDE 0.9 % IJ SOLN: 3.0000 mL | INTRAMUSCULAR | Status: DC | PRN

## 2014-05-07 NOTE — Progress Notes (Signed)
  Echocardiogram 2D Echocardiogram has been performed.  [place], [place] 05/07/2014, 12:08 PM

## 2014-05-07 NOTE — Progress Notes (Signed)
Called Dr. Luan Pulling' office and left message with Becky at ext [phone] for follow-up appointment.

## 2014-05-07 NOTE — Progress Notes (Signed)
Patient received discharge instructions and had no further questions.  Patient's IV was removed and was clean, dry, and intact at removal.  Patient was escorted to vehicle via wheelchair by nurse.

## 2014-05-07 NOTE — Care Management Note (Addendum)
    Page 1 of 1   05/07/2014     3:19:31 PM CARE MANAGEMENT NOTE 05/07/2014  Patient:  Emily Moran, Emily Moran   Account Number:  [id number]  Date Initiated:  05/07/2014  Documentation initiated by:  Theophilus Kinds  Subjective/Objective Assessment:   Pt admitted from home with CP. Pt lives with her son and will return home at discharge. Pt is independent with ADL's.     Action/Plan:   No Cm needs noted.   Anticipated DC Date:  05/08/2014   Anticipated DC Plan:  [place]  CM consult      Choice offered to / List presented to:             Status of service:  Completed, signed off Medicare Important Message given?   (If response is "NO", the following Medicare IM given date fields will be blank) Date Medicare IM given:   Medicare IM given by:   Date Additional Medicare IM given:   Additional Medicare IM given by:    Discharge Disposition:  HOME/SELF CARE  Per UR Regulation:    If discussed at Long Length of Stay Meetings, dates discussed:    Comments:  05/07/14 [place], RN BSN CM Pt meets CC 44 conditions. CC 44 given to pt who verbalized understanding, Paperwork added to shadow chart.  05/07/14 [place], RN BSN CM

## 2014-05-07 NOTE — Discharge Instructions (Signed)

## 2014-05-07 NOTE — Discharge Summary (Signed)
Physician Discharge Summary  Emily Moran LPF:[id number] DOB: 04-05-1956 DOA: 05/06/2014  PCP: Philis Fendt, MD  Admit date: 05/06/2014 Discharge date: 05/07/2014  Time spent: 40 minutes  Recommendations for Outpatient Follow-up:  1. Patient is scheduled to see pulmonology as an outpatient 2. Followup primary care physician in one to 2 weeks  Discharge Diagnoses:  Principal Problem:   Chest pain Active Problems:   SUBSTANCE ABUSE, MULTIPLE   COPD Acute bronchitis Tobacco abuse  Discharge Condition: improved  Diet recommendation: low salt  Filed Weights   05/06/14 1726 05/07/14 0115  Weight: 58.968 kg (130 lb) 56.8 kg (125 lb 3.5 oz)    History of present illness and hospital course:  This patient was admitted to the hospital with shortness of breath and right-sided chest pain which appears to be pleuritic in nature. She did not have any acute EKG changes and cardiac enzymes are currently negative. She has known history of COPD and continues to smoke tobacco. She also recently described coughing and wheezing. She was felt to have acute bronchitis. She was started on antibiotics, bronchodilators and steroids. With this management, her respiratory status significantly improved back to baseline. She did not have any further chest pain. Echocardiogram was also unremarkable. The patient feels back to baseline and is ready for discharge home. She will follow up with pulmonology as an outpatient as well as her primary care physician. She was strongly advised to quit smoking.   Procedures: Echo: - Left ventricle: The cavity size was normal. Wall thickness was normal. Systolic function was vigorous. The estimated ejection fraction was in the range of 65% to 70%. Doppler parameters are consistent with abnormal left ventricular relaxation (grade 1 diastolic dysfunction).     Consultations:    Discharge Exam: Filed Vitals:   05/07/14 1423  BP: 144/78  Pulse: 87  Temp: 98 F  (36.7 C)  Resp: 16    General: NAD Cardiovascular: S1, S2 RRR Respiratory: CTA B  Discharge Instructions You were cared for by a hospitalist during your hospital stay. If you have any questions about your discharge medications or the care you received while you were in the hospital after you are discharged, you can call the unit and asked to speak with the hospitalist on call if the hospitalist that took care of you is not available. Once you are discharged, your primary care physician will handle any further medical issues. Please note that NO REFILLS for any discharge medications will be authorized once you are discharged, as it is imperative that you return to your primary care physician (or establish a relationship with a primary care physician if you do not have one) for your aftercare needs so that they can reassess your need for medications and monitor your lab values.  Discharge Instructions   Call MD for:  difficulty breathing, headache or visual disturbances    Complete by:  As directed      Call MD for:  severe uncontrolled pain    Complete by:  As directed      Diet - low sodium heart healthy    Complete by:  As directed      Increase activity slowly    Complete by:  As directed             Medication List         albuterol (2.5 MG/3ML) 0.083% nebulizer solution  Commonly known as:  PROVENTIL  Take 3 mLs (2.5 mg total) by nebulization every 6 (six) hours as  needed for wheezing or shortness of breath.     DULoxetine 60 MG capsule  Commonly known as:  CYMBALTA  Take 60 mg by mouth 2 (two) times daily.     Fluticasone-Salmeterol 250-50 MCG/DOSE Aepb  Commonly known as:  ADVAIR DISKUS  Inhale 1 puff into the lungs 2 (two) times daily.     gabapentin 400 MG capsule  Commonly known as:  NEURONTIN  Take 400 mg by mouth 3 (three) times daily.     levofloxacin 500 MG tablet  Commonly known as:  LEVAQUIN  Take 1 tablet (500 mg total) by mouth daily.     methadone 10  MG/ML solution  Commonly known as:  DOLOPHINE  Take 78 mg by mouth daily. At outpatient methadone clinic     nicotine polacrilex 2 MG gum  Commonly known as:  EQ NICOTINE  Take 1 each (2 mg total) by mouth as needed for smoking cessation.     predniSONE 10 MG tablet  Commonly known as:  DELTASONE  Take 40mg  po daily for 2 days, then 30mg  po daily for 2 days then 20mg  po daily for 2days then 10mg  po daily for 2 days then stop     tiotropium 18 MCG inhalation capsule  Commonly known as:  SPIRIVA HANDIHALER  Place 1 capsule (18 mcg total) into inhaler and inhale daily.       Allergies  Allergen Reactions  . Aspirin     Upset stomach       Follow-up Information   Follow up with Alonza Bogus, MD On 05/25/2014. (Your appointment is at 9:00 am)    Specialty:  Pulmonary Disease   Contact information:   [address] [place] [address] [phone]       Follow up with Nolene Ebbs A, MD. Schedule an appointment as soon as possible for a visit in 2 weeks.   Specialty:  Internal Medicine   Contact information:   [address] [place] [address] [phone]        The results of significant diagnostics from this hospitalization (including imaging, microbiology, ancillary and laboratory) are listed below for reference.    Significant Diagnostic Studies: Dg Chest 2 View  05/06/2014   CLINICAL DATA:  Chest pain and difficulty breathing  EXAM: CHEST  2 VIEW  COMPARISON:  December 30, 2013  FINDINGS: There is underlying emphysematous change. There is extensive bullous disease in the right upper lobe. There are scattered areas of scarring. There is interstitial prominence in both lower lung zones, in part due to redistribution of blood flow to viable segments of lung. There is, however, felt to be a degree of underlying interstitial edema. The heart is mildly enlarged. The pulmonary vascularity reflects underlying emphysema. No adenopathy. There is  postoperative change in the lower cervical spine.  IMPRESSION: Suspect a degree of congestive heart failure superimposed on emphysematous change. No airspace consolidation.   Electronically Signed   By: Lowella Grip M.D.   On: 05/06/2014 18:48   Ct Angio Chest Pe W/cm &/or Wo Cm  05/07/2014   CLINICAL DATA:  Right-sided chest pain. Fever. [place]ammy. Shortness of breath.  EXAM: CT ANGIOGRAPHY CHEST WITH CONTRAST  TECHNIQUE: Multidetector CT imaging of the chest was performed using the standard protocol during bolus administration of intravenous contrast. Multiplanar CT image reconstructions and MIPs were obtained to evaluate the vascular anatomy.  CONTRAST:  132mL OMNIPAQUE IOHEXOL 350 MG/ML SOLN  COMPARISON:  None.  FINDINGS: Technically adequate study with good  opacification of the central and segmental pulmonary arteries. No focal filling defects demonstrated. No evidence of significant pulmonary embolus.  Borderline heart size. Normal caliber thoracic aorta. No aortic dissection. Great vessel origins are patent. No significant lymphadenopathy in the chest. Esophagus is decompressed.  Diffuse emphysematous changes throughout the lungs with severe bullous emphysematous changes in the right upper lung. Infiltration or edema in the lung bases. Focal scarring in the right middle lung. No pneumothorax. No pleural effusions.  Visualized portions of the upper abdominal organs demonstrate a cyst in the upper pole of the right kidney. Otherwise grossly unremarkable. No destructive bone lesions.  Review of the MIP images confirms the above findings.  IMPRESSION: No evidence of significant pulmonary embolus. Marked emphysematous changes in the lungs with scattered areas of scarring and with infiltration or edema in the lung bases.   Electronically Signed   By: Lucienne Capers M.D.   On: 05/07/2014 00:37    Microbiology: No results found for this or any previous visit (from the past 240 hour(s)).   Labs: Basic  Metabolic Panel:  Recent Labs Lab 05/06/14 1909 05/07/14 0153  NA 140 136*  K 4.9 4.3  CL 105 99  CO2 23 24  GLUCOSE 75 124*  BUN 18 20  CREATININE 1.07 1.07  CALCIUM 9.2 9.7   Liver Function Tests:  Recent Labs Lab 05/06/14 1909 05/07/14 0153  AST 32 32  ALT 32 31  ALKPHOS 145* 153*  BILITOT 0.3 0.3  PROT 8.2 8.3  ALBUMIN 4.1 4.1   No results found for this basename: LIPASE, AMYLASE,  in the last 168 hours No results found for this basename: AMMONIA,  in the last 168 hours CBC:  Recent Labs Lab 05/06/14 1909 05/07/14 0153  WBC 10.2 10.4  NEUTROABS 6.5  --   HGB 13.9 15.0  HCT 41.5 43.8  MCV 93.0 92.2  PLT 259 245   Cardiac Enzymes:  Recent Labs Lab 05/06/14 1909 05/07/14 0153 05/07/14 0720 05/07/14 1411  TROPONINI <0.30 <0.30 <0.30 <0.30   BNP: BNP (last 3 results)  Recent Labs  12/30/13 1115 05/06/14 1909  PROBNP 1094.0* 131.3*   CBG: No results found for this basename: GLUCAP,  in the last 168 hours     Signed:  MEMON,JEHANZEB  [facility] 05/07/2014, 6:11 PM

## 2014-05-07 NOTE — Progress Notes (Signed)
UR chart review completed.  

## 2014-09-14 ENCOUNTER — Emergency Department (HOSPITAL_COMMUNITY)
Admission: EM | Admit: 2014-09-14 | Discharge: 2014-09-14 | Disposition: A | Discharge status: Home / Self Care | Payer: Medicare Other | Attending: Emergency Medicine | Admitting: Emergency Medicine

## 2014-09-14 ENCOUNTER — Encounter (HOSPITAL_COMMUNITY): Payer: Self-pay | Admitting: Emergency Medicine

## 2014-09-14 DIAGNOSIS — J449 Chronic obstructive pulmonary disease, unspecified: Secondary | ICD-10-CM | POA: Insufficient documentation

## 2014-09-14 DIAGNOSIS — Z7951 Long term (current) use of inhaled steroids: Secondary | ICD-10-CM | POA: Insufficient documentation

## 2014-09-14 DIAGNOSIS — I10 Essential (primary) hypertension: Secondary | ICD-10-CM | POA: Insufficient documentation

## 2014-09-14 DIAGNOSIS — M5431 Sciatica, right side: Secondary | ICD-10-CM | POA: Insufficient documentation

## 2014-09-14 DIAGNOSIS — M549 Dorsalgia, unspecified: Secondary | ICD-10-CM

## 2014-09-14 DIAGNOSIS — Z79899 Other long term (current) drug therapy: Secondary | ICD-10-CM | POA: Diagnosis not present

## 2014-09-14 DIAGNOSIS — Z720 Tobacco use: Secondary | ICD-10-CM | POA: Insufficient documentation

## 2014-09-14 DIAGNOSIS — G8929 Other chronic pain: Secondary | ICD-10-CM

## 2014-09-14 DIAGNOSIS — M545 Low back pain: Secondary | ICD-10-CM | POA: Diagnosis present

## 2014-09-14 HISTORY — DX: Other intervertebral disc degeneration, lumbar region: M51.36

## 2014-09-14 MED ORDER — PREDNISONE 20 MG PO TABS: ORAL_TABLET | ORAL | Status: DC

## 2014-09-14 MED ORDER — ORPHENADRINE CITRATE 30 MG/ML IJ SOLN: 60.0000 mg | Freq: Two times a day (BID) | INTRAMUSCULAR | Status: DC

## 2014-09-14 MED ORDER — ORPHENADRINE CITRATE ER 100 MG PO TB12: 100.0000 mg | ORAL_TABLET | Freq: Two times a day (BID) | ORAL | Status: DC

## 2014-09-14 MED ORDER — KETOROLAC TROMETHAMINE 60 MG/2ML IM SOLN
60.0000 mg | Freq: Once | INTRAMUSCULAR | Status: AC
  Administered 2014-09-14: 60 mg via INTRAMUSCULAR
  Filled 2014-09-14: qty 2

## 2014-09-14 NOTE — ED Notes (Signed)
Pt has been hypertensive throughout visit. MD aware. Pt. States she will take her BP meds hen she gets home.

## 2014-09-14 NOTE — Discharge Instructions (Signed)
Chronic Back Pain  When back pain lasts longer than 3 months, it is called chronic back pain.People with chronic back pain often go through certain periods that are more intense (flare-ups).  CAUSES Chronic back pain can be caused by wear and tear (degeneration) on different structures in your back. These structures include:  The bones of your spine (vertebrae) and the joints surrounding your spinal cord and nerve roots (facets).  The strong, fibrous tissues that connect your vertebrae (ligaments). Degeneration of these structures may result in pressure on your nerves. This can lead to constant pain. HOME CARE INSTRUCTIONS  Avoid bending, heavy lifting, prolonged sitting, and activities which make the problem worse.  Take brief periods of rest throughout the day to reduce your pain. Lying down or standing usually is better than sitting while you are resting.  Take over-the-counter or prescription medicines only as directed by your caregiver. SEEK IMMEDIATE MEDICAL CARE IF:   You have weakness or numbness in one of your legs or feet.  You have trouble controlling your bladder or bowels.  You have nausea, vomiting, abdominal pain, shortness of breath, or fainting. Document Released: 11/01/2004 Document Revised: 12/17/2011 Document Reviewed: 09/08/2011 [facility] Patient Information 2015 [place], [place]. This information is not intended to replace advice given to you by your health care provider. Make sure you discuss any questions you have with your health care provider. Sciatica Sciatica is pain, weakness, numbness, or tingling along the path of the sciatic nerve. The nerve starts in the lower back and runs down the back of each leg. The nerve controls the muscles in the lower leg and in the back of the knee, while also providing sensation to the back of the thigh, lower leg, and the sole of your foot. Sciatica is a symptom of another medical condition. For instance, nerve damage or certain  conditions, such as a herniated disk or bone spur on the spine, pinch or put pressure on the sciatic nerve. This causes the pain, weakness, or other sensations normally associated with sciatica. Generally, sciatica only affects one side of the body. CAUSES   Herniated or slipped disc.  Degenerative disk disease.  A pain disorder involving the narrow muscle in the buttocks (piriformis syndrome).  Pelvic injury or fracture.  Pregnancy.  Tumor (rare). SYMPTOMS  Symptoms can vary from mild to very severe. The symptoms usually travel from the low back to the buttocks and down the back of the leg. Symptoms can include:  Mild tingling or dull aches in the lower back, leg, or hip.  Numbness in the back of the calf or sole of the foot.  Burning sensations in the lower back, leg, or hip.  Sharp pains in the lower back, leg, or hip.  Leg weakness.  Severe back pain inhibiting movement. These symptoms may get worse with coughing, sneezing, laughing, or prolonged sitting or standing. Also, being overweight may worsen symptoms. DIAGNOSIS  Your caregiver will perform a physical exam to look for common symptoms of sciatica. He or she may ask you to do certain movements or activities that would trigger sciatic nerve pain. Other tests may be performed to find the cause of the sciatica. These may include:  Blood tests.  X-rays.  Imaging tests, such as an MRI or CT scan. TREATMENT  Treatment is directed at the cause of the sciatic pain. Sometimes, treatment is not necessary and the pain and discomfort goes away on its own. If treatment is needed, your caregiver may suggest:  Over-the-counter medicines  to relieve pain.  Prescription medicines, such as anti-inflammatory medicine, muscle relaxants, or narcotics.  Applying heat or ice to the painful area.  Steroid injections to lessen pain, irritation, and inflammation around the nerve.  Reducing activity during periods of pain.  Exercising  and stretching to strengthen your abdomen and improve flexibility of your spine. Your caregiver may suggest losing weight if the extra weight makes the back pain worse.  Physical therapy.  Surgery to eliminate what is pressing or pinching the nerve, such as a bone spur or part of a herniated disk. HOME CARE INSTRUCTIONS   Only take over-the-counter or prescription medicines for pain or discomfort as directed by your caregiver.  Apply ice to the affected area for 20 minutes, 3-4 times a day for the first 48-72 hours. Then try heat in the same way.  Exercise, stretch, or perform your usual activities if these do not aggravate your pain.  Attend physical therapy sessions as directed by your caregiver.  Keep all follow-up appointments as directed by your caregiver.  Do not wear high heels or shoes that do not provide proper support.  Check your mattress to see if it is too soft. A firm mattress may lessen your pain and discomfort. SEEK IMMEDIATE MEDICAL CARE IF:   You lose control of your bowel or bladder (incontinence).  You have increasing weakness in the lower back, pelvis, buttocks, or legs.  You have redness or swelling of your back.  You have a burning sensation when you urinate.  You have pain that gets worse when you lie down or awakens you at night.  Your pain is worse than you have experienced in the past.  Your pain is lasting longer than 4 weeks.  You are suddenly losing weight without reason. MAKE SURE YOU:  Understand these instructions.  Will watch your condition.  Will get help right away if you are not doing well or get worse. Document Released: 09/18/2001 Document Revised: 03/25/2012 Document Reviewed: 02/03/2012 [facility] Patient Information 2015 [place], [place]. This information is not intended to replace advice given to you by your health care provider. Make sure you discuss any questions you have with your health care provider.  Discuss with your doctor  whether or not you need referral to pain management again. The resource guide is provided for you to contact a pain management doctor if needed.  Emergency Department Resource Guide 1) Find a Doctor and Pay Out of Pocket Although you won't have to find out who is covered by your insurance plan, it is a good idea to ask around and get recommendations. You will then need to call the office and see if the doctor you have chosen will accept you as a new patient and what types of options they offer for patients who are self-pay. Some doctors offer discounts or will set up payment plans for their patients who do not have insurance, but you will need to ask so you aren't surprised when you get to your appointment.  2) Contact Your Local Health Department Not all health departments have doctors that can see patients for sick visits, but many do, so it is worth a call to see if yours does. If you don't know where your local health department is, you can check in your phone book. The CDC also has a tool to help you locate your state's health department, and many state websites also have listings of all of their local health departments.  3) Find a [facility] If  your illness is not likely to be very severe or complicated, you may want to try a walk in clinic. These are popping up all over the country in pharmacies, drugstores, and shopping centers. They're usually staffed by nurse practitioners or physician assistants that have been trained to treat common illnesses and complaints. They're usually fairly quick and inexpensive. However, if you have serious medical issues or chronic medical problems, these are probably not your best option.  No Primary Care Doctor: - Call Health Connect at  [phone] - they can help you locate a primary care doctor that  accepts your insurance, provides certain services, etc. - Physician Referral Service- [phone]  Chronic Pain Problems: Organization          Address  Phone   Notes  [facility]  [phone] Patients need to be referred by their primary care doctor.   Medication Assistance: Organization         Address  Phone   Notes  [facility] Medication [facility] [address] [place], [place] [address] [phone] --Must be a resident of [facility] -- Must have NO insurance coverage whatsoever (no Medicaid/ Medicare, etc.) -- The pt. MUST have a primary care doctor that directs their care regularly and follows them in the community   MedAssist  [phone]   [employer]  [phone]    Agencies that provide inexpensive medical care: Organization         Address  Phone   Notes  [place]  [phone]   [facility]    [phone]   [facility] [place], [place] [address] [phone]   [place] [address], [place] [phone]   Planned Parenthood    [phone]   [facility]    [phone]   [place] and [place] [address], [place] Phone:  [phone], Fax:  [phone] Hours of Operation:  9 am - 6 pm, M-F.  Also accepts Medicaid/Medicare and self-pay.  [facility] for [place], [address], [place] Phone: [phone], Fax: [phone]. Hours of Operation:  8:30 am - 5:30 pm, M-F.  Also accepts Medicaid and self-pay.  [facility] [place] [address] [place] Phone: [phone]   [place], [place], [place] [phone], Ext. [phone] Mondays & Thursdays: 7-9 AM.  First 15 patients are seen on a first come, first serve basis.    [place] Providers:  Organization         Address  Phone   Notes  [facility] [address], [place] [phone] Also accepts self-pay patients.  [facility] [address], [place]  [phone]   [place], [address], [place] [phone]   [facility] [address], [place] [phone]   Lucianne Lei [address], [place]   [phone] Only accepts [place] Access [place] patients after they have their name applied to their card.   Self-Pay (no insurance) in [facility]:  Organization         Address  Phone   Notes  Sickle Cell Patients, [facility] [place], [place] [phone]  [facility] [place] [phone]   [facility] [place]  [address], [place] [phone]   [facility]/Dr. Osei-Bonsu  [address], [place] or [address], [place] [phone] Phone number for both [place] and [place] locations is the same.  Urgent Medical and [facility] [address], [place] [phone]   [facility] [address], [place] or [address] [phone]   [facility] [address] [place] [phone], phone; [phone], fax Sees patients 1st and 3rd Saturday of every month.  Must not qualify for public or private insurance (i.e. Medicaid, Medicare, [place] Health Choice, Veterans' Benefits)  Household income should be no more than 200% of the poverty level The clinic cannot treat you if you are pregnant or think you are pregnant  Sexually transmitted diseases are not treated at the clinic.    Dental Care: Organization         Address  Phone  Notes  [facility] Department of [facility] [place] [phone] Accepts children up to age 51 who are enrolled in [place] or [place]; pregnant women with a Medicaid card; and  children who have applied for Medicaid or [place] Health Choice, but were declined, whose parents can pay a reduced fee at time of service.  [facility] Department of [facility]  [address], [place] [phone] Accepts children up to age 50 who are enrolled in [place] or [place]; pregnant women with a Medicaid card; and children who have applied for Medicaid or  Health Choice, but were declined, whose parents can pay a reduced fee at time of service.  [place] Adult Dental Access PROGRAM  [place] [phone] Patients are seen by appointment only. Walk-ins are not accepted. [place] will see patients 42 years of age and older. Monday - Tuesday (8am-5pm) Most Wednesdays (8:30-5pm) $30 per visit, cash only  [facility] Adult [facility] Access PROGRAM  [address], [facility] [phone] Patients are seen by appointment only. Walk-ins are not accepted. [place] will see patients 37 years of age and older. One Wednesday Evening (Monthly: Volunteer Based).  $30 per visit, cash only  [place]  [phone] for adults; Children under age 22, call Graduate Pediatric Dentistry at [phone]. Children aged 42-14, please call [phone] to request a pediatric application.  Dental services are provided in all areas of dental care including fillings, crowns and bridges, complete and partial dentures, implants, gum treatment, root canals, and extractions. Preventive care is also provided. Treatment is provided to both adults and children. Patients are selected via a lottery and there is often a waiting list.   [facility] [address] [place]  [phone] [email or web address]   [facility] [address] [place], [place] [phone], Ext. [phone] Second and Fourth Thursday of each month, opens at 6:30 AM; Clinic ends at 9 AM.  Patients are seen on a first-come first-served  basis, and a limited number are seen during each clinic.   [facility]  [address] Hillard Danker [place], [place] [phone]   Eligibility Requirements You must have lived in [place], [place], or [place] counties for at least the  last three months.   You cannot be eligible for state or federal sponsored [employer], including [employer], [place], or [employer].   You generally cannot be eligible for healthcare insurance through your employer.    How to apply: Eligibility screenings are held every Tuesday and Wednesday afternoon from 1:00 pm until 4:00 pm. You do not need an appointment for the interview!  [facility] [address] [place], [place]   [place]  [place] Department  [place]  [phone]    Behavioral Health Resources in the Community: Intensive Outpatient Programs Organization         Address  Phone  Notes  Joffre Perry. [address], [place], [place] [phone]   [facility] [place] Outpatient [address] [place], [place]   ADS: Alcohol & Drug Svcs [address], [place], [place]   [place] [address]  [place], [place] or [phone]   Substance Abuse Resources Organization         Address  Phone  Notes  Alcohol and Drug Services  [phone]   [place]  [phone]   [facility] [place]   Chinita Pester  [phone]   Residential & Outpatient Substance Abuse Program  [phone]   Psychological Services Organization         Address  Phone  Notes  [facility] [place]  [place]  [phone]   [place] [address], [place] or [phone]    Mobile Crisis Teams Organization          Address  Phone  Notes  [facility], Mobile Crisis Care Unit  [phone]   [facility]  [address] [place], [place]   [place] [address], [place] [phone]    Self-Help/Support Groups Organization         Address  Phone             Notes  [place]. of [place] - variety of support groups  [place] Call for more information  Narcotics Anonymous (NA), Caring Services [address], [employer] [place]  2 meetings at this location   Special educational needs teacher         Address  Phone  Notes  ASAP Residential Treatment [place],    [place]  [phone]   [facility]  [address], [place] [id number], [place], [place]   [place], [place] [phone] Admissions: 8am-3pm M-F  Incentives Substance [employer] [address],    [place], [place] [phone]   [facility] [address] [place], [place], [place]   [facility] [address]  [place], [place]   [facility]ight Programs - Intensive Outpatient [address], [place], [place]   [facility] ([place].,  [place], [place] [phone] or [phone]   [facility]idential Treatment Services (RTS) [address], [place], [place] Accepts Medicaid  [facility] [place] [address]  [place] [phone] Substance Abuse/Addiction Treatment   [facility] Organization         Address  Phone  Notes  [facility]  [phone]   Almyra Free  Karna Dupes, PhD [address] Arlis Porta [place], [place]   [phone] or [phone]   [place], [place] [phone]   [place] [address], [place], [place] [phone] Insurance/Medicaid/sponsorship  through [facility] and Families [address]                                    [place], [place] [phone] [place] [address], [place] [phone]    Dr. Adele Schilder  [phone]   [facility] Dept. 1) [address] [place] 2) [place] 3)  [address], [place] [phone]  [phone]   [place] [phone] or [phone] (After Hours)

## 2014-09-14 NOTE — ED Notes (Signed)
Pt is is stable condition and refuses the use of a wheelchair. Pt. Ambulated out of the ED with this RN and will return home. Prescriptions reviewed with pt and she verbalizes understanding.

## 2014-09-14 NOTE — ED Notes (Signed)
Patient requesting to speak with Dr. Johnney Killian. Explained to patient Dr. Johnney Killian is waiting for return phone call from patients PCP to determine plan for pain medication. Patient stating "that doctor doesn't even know me. I've seen him one time." Notified Dr. Johnney Killian.

## 2014-09-14 NOTE — ED Notes (Signed)
Pt states she has DDD and was walking her dog yesterday- believes this agrivated her back. Pt states her lower back is hurting and radiates down her right leg/hip. Pt ambulatory. Normally walks with cane.

## 2014-09-14 NOTE — ED Notes (Signed)
Pt on call bell requesting pain medication. Patient states she has detoxed from methadone but is still having pain. Dr. Johnney Killian notified x2.

## 2014-09-14 NOTE — ED Provider Notes (Signed)
CSN: [id number]     Arrival date & time 09/14/14  0802 History   First MD Initiated Contact with Patient 09/14/14 0825     Chief Complaint  Patient presents with  . Back Pain     (Consider location/radiation/quality/duration/timing/severity/associated sxs/prior Treatment) HPI  Patient reports that she has known degenerative joint disease and a history of chronic pain management. She reports that yesterday she was walking her dog and the dog jerked her. She reports that she developed a lot of pain in her right lower back. She reports that sharp and shooting. She reports that it shoots down the right side of her thigh. She has not had any bowel or bladder incontinence or dysfunction. No associated abdominal pain. She reports that she always uses a cane due to weakness in her legs. There has been no change in quality or weakness or function, however she reports that the pain is making it more difficult for her to walk. She reports that she recently stopped pain management because she wanted to be able to "go it on her own". She reports however she has not been able to manage her pain.  Past Medical History  Diagnosis Date  . COPD (chronic obstructive pulmonary disease)   . Degenerative disc disease   . Spinal stenosis   . DDD (degenerative disc disease)   . DDD (degenerative disc disease)   . Hypertension   . Degenerative disc disease   . DDD (degenerative disc disease), lumbar    Past Surgical History  Procedure Laterality Date  . Neck surgery    . Abdominal hysterectomy    . Bladder surgery    . Femur im nail Right 11/28/2012    Procedure: INTRAMEDULLARY (IM) RETROGRADE FEMORAL NAILING wants jackson table , c-arm and biomet ;  Surgeon: Mauri Pole, MD;  Location: [facility];  Service: Orthopedics;  Laterality: Right;   History reviewed. No pertinent family history. History  Substance Use Topics  . Smoking status: Current Every Day Smoker -- 0.50 packs/day    Types: Cigarettes  .  Smokeless tobacco: Never Used  . Alcohol Use: No     Comment: History of ETOH abuse   OB History    No data available     Review of Systems  10 Systems reviewed and are negative for acute change except as noted in the HPI.   Allergies  Aspirin  Home Medications   Prior to Admission medications   Medication Sig Start Date End Date Taking? Authorizing Provider  albuterol (PROVENTIL HFA;VENTOLIN HFA) 108 (90 BASE) MCG/ACT inhaler Inhale 1 puff into the lungs every 6 (six) hours as needed for wheezing or shortness of breath.   Yes Historical Provider, MD  albuterol (PROVENTIL) (2.5 MG/3ML) 0.083% nebulizer solution Take 3 mLs (2.5 mg total) by nebulization every 6 (six) hours as needed for wheezing or shortness of breath. 05/07/14  Yes Kathie Dike, MD  DULoxetine (CYMBALTA) 60 MG capsule Take 60 mg by mouth 2 (two) times daily.   Yes Historical Provider, MD  Fluticasone-Salmeterol (ADVAIR DISKUS) 250-50 MCG/DOSE AEPB Inhale 1 puff into the lungs 2 (two) times daily. 05/07/14  Yes Kathie Dike, MD  gabapentin (NEURONTIN) 600 MG tablet Take 600 mg by mouth 3 (three) times daily.   Yes Historical Provider, MD  levofloxacin (LEVAQUIN) 500 MG tablet Take 1 tablet (500 mg total) by mouth daily. Patient not taking: Reported on 09/14/2014 05/07/14   Kathie Dike, MD  nicotine polacrilex (EQ NICOTINE) 2 MG gum Take 1  each (2 mg total) by mouth as needed for smoking cessation. Patient not taking: Reported on 09/14/2014 05/07/14   Kathie Dike, MD  predniSONE (DELTASONE) 10 MG tablet Take 40mg  po daily for 2 days, then 30mg  po daily for 2 days then 20mg  po daily for 2days then 10mg  po daily for 2 days then stop Patient not taking: Reported on 09/14/2014 05/07/14   Kathie Dike, MD  tiotropium (SPIRIVA HANDIHALER) 18 MCG inhalation capsule Place 1 capsule (18 mcg total) into inhaler and inhale daily. Patient not taking: Reported on 09/14/2014 05/07/14   Kathie Dike, MD   BP 136/106 mmHg   Temp(Src) 98.1 F (36.7 C) (Oral)  Resp 19  Ht 5\' 8"  (1.727 m)  Wt 118 lb (53.524 kg)  BMI 17.95 kg/m2  SpO2 96% Physical Exam  Constitutional: She is oriented to person, place, and time. She appears well-developed and well-nourished.  HENT:  Head: Normocephalic and atraumatic.  Eyes: EOM are normal. Pupils are equal, round, and reactive to light.  Neck: Neck supple.  Cardiovascular: Normal rate, regular rhythm, normal heart sounds and intact distal pulses.   Pulmonary/Chest: Effort normal and breath sounds normal.  Abdominal: Soft. Bowel sounds are normal. She exhibits no distension. There is no tenderness.  Musculoskeletal: Normal range of motion. She exhibits no edema.  The patient is able to sit forward in the stretcher in reposition. She endorses pain to palpation over the right SI joint and lateral to this. There is no palpable abnormality. Skin condition is normal. The patient has intact strength of bilateral lower extremities. She is able to elevate the right lower extremity off the bed by herself. She does this to assist me in removing her boots. She has intact sensation to light touch bilaterally. Calves are soft and nontender. She has intact flexion and extension of the feet. She endorses pain with straight leg raise on the right at 45.  The patient is ambulatory about the room with a mildly antalgic gait. She shows no evidence of neurologic dysfunction or gait instability.  Neurological: She is alert and oriented to person, place, and time. She has normal strength. Coordination normal. GCS eye subscore is 4. GCS verbal subscore is 5. GCS motor subscore is 6.  Skin: Skin is warm, dry and intact.  Psychiatric: She has a normal mood and affect.    ED Course  Procedures (including critical care time) Labs Review Labs Reviewed - No data to display  Imaging Review No results found.   EKG Interpretation None      MDM   Final diagnoses:  Sciatica, right  Exacerbation of  chronic back pain   The patient presents with an exacerbation of chronic pain. At this point there is no evidence of neurologic dysfunction. The patient is requesting narcotic pain control in the emergency department. At this point I do feel that symptomatic treatment with a steroid taper and muscle relaxers is appropriate. The patient does report a history of addiction and chronic pain management. As this does appear to be chronic pain with exacerbation I did not feel that reinstituting narcotics is appropriate at this point time. I have attempted to contact the patient's primary provider. At this point I have not received a call back. The patient reports that talking to her primary provider will not be of any use because she has only seen that person once.    Charlesetta Shanks, MD 09/14/14 [phone]

## 2014-09-14 NOTE — ED Notes (Signed)
Pt given a coke

## 2015-02-15 ENCOUNTER — Emergency Department (HOSPITAL_COMMUNITY): Payer: Medicare Other

## 2015-02-15 ENCOUNTER — Emergency Department (HOSPITAL_COMMUNITY)
Admission: EM | Admit: 2015-02-15 | Discharge: 2015-02-15 | Disposition: A | Discharge status: Home / Self Care | Payer: Medicare Other | Attending: Emergency Medicine | Admitting: Emergency Medicine

## 2015-02-15 ENCOUNTER — Encounter (HOSPITAL_COMMUNITY): Payer: Self-pay

## 2015-02-15 DIAGNOSIS — J449 Chronic obstructive pulmonary disease, unspecified: Secondary | ICD-10-CM | POA: Diagnosis not present

## 2015-02-15 DIAGNOSIS — Y998 Other external cause status: Secondary | ICD-10-CM | POA: Insufficient documentation

## 2015-02-15 DIAGNOSIS — Z8739 Personal history of other diseases of the musculoskeletal system and connective tissue: Secondary | ICD-10-CM | POA: Diagnosis not present

## 2015-02-15 DIAGNOSIS — S4991XA Unspecified injury of right shoulder and upper arm, initial encounter: Secondary | ICD-10-CM | POA: Diagnosis present

## 2015-02-15 DIAGNOSIS — Z7952 Long term (current) use of systemic steroids: Secondary | ICD-10-CM | POA: Diagnosis not present

## 2015-02-15 DIAGNOSIS — Z7951 Long term (current) use of inhaled steroids: Secondary | ICD-10-CM | POA: Insufficient documentation

## 2015-02-15 DIAGNOSIS — Y9289 Other specified places as the place of occurrence of the external cause: Secondary | ICD-10-CM | POA: Diagnosis not present

## 2015-02-15 DIAGNOSIS — Y9389 Activity, other specified: Secondary | ICD-10-CM | POA: Diagnosis not present

## 2015-02-15 DIAGNOSIS — Z79899 Other long term (current) drug therapy: Secondary | ICD-10-CM | POA: Insufficient documentation

## 2015-02-15 DIAGNOSIS — Z720 Tobacco use: Secondary | ICD-10-CM | POA: Insufficient documentation

## 2015-02-15 DIAGNOSIS — I10 Essential (primary) hypertension: Secondary | ICD-10-CM | POA: Insufficient documentation

## 2015-02-15 DIAGNOSIS — S52042A Displaced fracture of coronoid process of left ulna, initial encounter for closed fracture: Secondary | ICD-10-CM | POA: Diagnosis not present

## 2015-02-15 MED ORDER — KETOROLAC TROMETHAMINE 30 MG/ML IJ SOLN
30.0000 mg | Freq: Once | INTRAMUSCULAR | Status: AC
  Administered 2015-02-15: 30 mg via INTRAMUSCULAR
  Filled 2015-02-15: qty 1

## 2015-02-15 NOTE — Discharge Instructions (Signed)
Cast or Splint Care °Casts and splints support injured limbs and keep bones from moving while they heal. It is important to care for your cast or splint at home.   °HOME CARE INSTRUCTIONS °· Keep the cast or splint uncovered during the drying period. It can take 24 to 48 hours to dry if it is made of plaster. A fiberglass cast will dry in less than 1 hour. °· Do not rest the cast on anything harder than a pillow for the first 24 hours. °· Do not put weight on your injured limb or apply pressure to the cast until your health care provider gives you permission. °· Keep the cast or splint dry. Wet casts or splints can lose their shape and may not support the limb as well. A wet cast that has lost its shape can also create harmful pressure on your skin when it dries. Also, wet skin can become infected. °· Cover the cast or splint with a plastic bag when bathing or when out in the rain or snow. If the cast is on the trunk of the body, take sponge baths until the cast is removed. °· If your cast does become wet, dry it with a towel or a blow dryer on the cool setting only. °· Keep your cast or splint clean. Soiled casts may be wiped with a moistened cloth. °· Do not place any hard or soft foreign objects under your cast or splint, such as cotton, toilet paper, lotion, or powder. °· Do not try to scratch the skin under the cast with any object. The object could get stuck inside the cast. Also, scratching could lead to an infection. If itching is a problem, use a blow dryer on a cool setting to relieve discomfort. °· Do not trim or cut your cast or remove padding from inside of it. °· Exercise all joints next to the injury that are not immobilized by the cast or splint. For example, if you have a long leg cast, exercise the hip joint and toes. If you have an arm cast or splint, exercise the shoulder, elbow, thumb, and fingers. °· Elevate your injured arm or leg on 1 or 2 pillows for the first 1 to 3 days to decrease  swelling and pain. It is best if you can comfortably elevate your cast so it is higher than your heart. °SEEK MEDICAL CARE IF:  °· Your cast or splint cracks. °· Your cast or splint is too tight or too loose. °· You have unbearable itching inside the cast. °· Your cast becomes wet or develops a soft spot or area. °· You have a bad smell coming from inside your cast. °· You get an object stuck under your cast. °· Your skin around the cast becomes red or raw. °· You have new pain or worsening pain after the cast has been applied. °SEEK IMMEDIATE MEDICAL CARE IF:  °· You have fluid leaking through the cast. °· You are unable to move your fingers or toes. °· You have discolored (blue or white), cool, painful, or very swollen fingers or toes beyond the cast. °· You have tingling or numbness around the injured area. °· You have severe pain or pressure under the cast. °· You have any difficulty with your breathing or have shortness of breath. °· You have chest pain. °Document Released: 09/21/2000 Document Revised: 07/15/2013 Document Reviewed: 04/02/2013 °[employer]® Patient Information ©2015 [employer]. This information is not intended to replace advice given to you by your health care   provider. Make sure you discuss any questions you have with your health care provider.     Elbow Fracture, Simple A fracture is a break in one of the bones.When fractures are not displaced or separated, they may be treated with only a sling or splint. The sling or splint may only be required for two to three weeks. In these cases, often the elbow is put through early range of motion exercises to prevent the elbow from getting stiff. DIAGNOSIS  The diagnosis (learning what is wrong) of a fractured elbow is made by x-ray. These may be required before and after the elbow is put into a splint or cast. X-rays are taken after to make sure the bone pieces have not moved. HOME CARE INSTRUCTIONS   Only take over-the-counter or  prescription medicines for pain, discomfort, or fever as directed by your caregiver.  If you have a splint held on with an elastic wrap and your hand or fingers become numb or cold and blue, loosen the wrap and reapply more loosely. See your caregiver if there is no relief.  You may use ice for twenty minutes, four times per day, for the first two to three days.  Use your elbow as directed.  See your caregiver as directed. It is very important to keep all follow-up referrals and appointments in order to avoid any long-term problems with your elbow including chronic pain or stiffness. SEEK IMMEDIATE MEDICAL CARE IF:   There is swelling or increasing pain in elbow.  You begin to lose feeling or experience numbness or tingling in your hand or fingers.  You develop swelling of the hand and fingers.  You get a cold or blue hand or fingers on affected side. MAKE SURE YOU:   Understand these instructions.  Will watch your condition.  Will get help right away if you are not doing well or get worse. Document Released: 09/18/2001 Document Revised: 12/17/2011 Document Reviewed: 08/09/2009 [facility] Patient Information 2015 [place], [place]. This information is not intended to replace advice given to you by your health care provider. Make sure you discuss any questions you have with your health care provider.

## 2015-02-15 NOTE — ED Notes (Signed)
Ortho paged. 

## 2015-02-15 NOTE — ED Notes (Signed)
Pt transported to xray 
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
## 2015-02-15 NOTE — ED Notes (Signed)
Family at bedside. 

## 2015-02-15 NOTE — Progress Notes (Signed)
Orthopedic Tech Progress Note Patient Details:  Emily Moran [birth year] [id number]  Ortho Devices Type of Ortho Device: Ace wrap, Arm sling, Long arm splint Ortho Device/Splint Location: rue Ortho Device/Splint Interventions: Application   [name] 02/15/2015, 11:38 AM
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
## 2015-03-16 ENCOUNTER — Emergency Department (HOSPITAL_COMMUNITY): Payer: Medicare Other

## 2015-03-16 ENCOUNTER — Encounter (HOSPITAL_COMMUNITY): Payer: Self-pay | Admitting: Emergency Medicine

## 2015-03-16 ENCOUNTER — Inpatient Hospital Stay (HOSPITAL_COMMUNITY)
Admission: EM | Admit: 2015-03-16 | Discharge: 2015-03-19 | DRG: 190 | Disposition: A | Discharge status: Home / Self Care | Payer: Medicare Other | Attending: Internal Medicine | Admitting: Internal Medicine

## 2015-03-16 DIAGNOSIS — Z90710 Acquired absence of both cervix and uterus: Secondary | ICD-10-CM

## 2015-03-16 DIAGNOSIS — F1410 Cocaine abuse, uncomplicated: Secondary | ICD-10-CM | POA: Diagnosis present

## 2015-03-16 DIAGNOSIS — I10 Essential (primary) hypertension: Secondary | ICD-10-CM | POA: Diagnosis present

## 2015-03-16 DIAGNOSIS — J9601 Acute respiratory failure with hypoxia: Secondary | ICD-10-CM | POA: Diagnosis present

## 2015-03-16 DIAGNOSIS — F1910 Other psychoactive substance abuse, uncomplicated: Secondary | ICD-10-CM | POA: Diagnosis present

## 2015-03-16 DIAGNOSIS — T380X5A Adverse effect of glucocorticoids and synthetic analogues, initial encounter: Secondary | ICD-10-CM | POA: Diagnosis present

## 2015-03-16 DIAGNOSIS — D72829 Elevated white blood cell count, unspecified: Secondary | ICD-10-CM

## 2015-03-16 DIAGNOSIS — R739 Hyperglycemia, unspecified: Secondary | ICD-10-CM | POA: Diagnosis present

## 2015-03-16 DIAGNOSIS — J449 Chronic obstructive pulmonary disease, unspecified: Secondary | ICD-10-CM | POA: Diagnosis present

## 2015-03-16 DIAGNOSIS — Z9981 Dependence on supplemental oxygen: Secondary | ICD-10-CM

## 2015-03-16 DIAGNOSIS — G894 Chronic pain syndrome: Secondary | ICD-10-CM | POA: Diagnosis present

## 2015-03-16 DIAGNOSIS — F1210 Cannabis abuse, uncomplicated: Secondary | ICD-10-CM | POA: Diagnosis present

## 2015-03-16 DIAGNOSIS — B182 Chronic viral hepatitis C: Secondary | ICD-10-CM | POA: Diagnosis present

## 2015-03-16 DIAGNOSIS — F17210 Nicotine dependence, cigarettes, uncomplicated: Secondary | ICD-10-CM | POA: Diagnosis present

## 2015-03-16 DIAGNOSIS — J441 Chronic obstructive pulmonary disease with (acute) exacerbation: Principal | ICD-10-CM | POA: Diagnosis present

## 2015-03-16 DIAGNOSIS — F17200 Nicotine dependence, unspecified, uncomplicated: Secondary | ICD-10-CM | POA: Diagnosis present

## 2015-03-16 DIAGNOSIS — R0602 Shortness of breath: Secondary | ICD-10-CM | POA: Diagnosis present

## 2015-03-16 DIAGNOSIS — B1920 Unspecified viral hepatitis C without hepatic coma: Secondary | ICD-10-CM | POA: Diagnosis present

## 2015-03-16 HISTORY — DX: Other chronic pain: G89.29

## 2015-03-16 HISTORY — DX: Calculus of kidney: N20.0

## 2015-03-16 HISTORY — DX: Headache: R51

## 2015-03-16 HISTORY — DX: Dependence on supplemental oxygen: Z99.81

## 2015-03-16 HISTORY — DX: Family history of other specified conditions: Z84.89

## 2015-03-16 HISTORY — DX: Pain, unspecified: R52

## 2015-03-16 HISTORY — DX: Unspecified viral hepatitis C without hepatic coma: B19.20

## 2015-03-16 HISTORY — DX: Major depressive disorder, single episode, unspecified: F32.9

## 2015-03-16 HISTORY — DX: Headache, unspecified: R51.9

## 2015-03-16 HISTORY — DX: Personal history of peptic ulcer disease: Z87.11

## 2015-03-16 HISTORY — DX: Unspecified chronic bronchitis: J42

## 2015-03-16 HISTORY — DX: Anemia, unspecified: D64.9

## 2015-03-16 HISTORY — DX: Dorsalgia, unspecified: M54.9

## 2015-03-16 HISTORY — DX: Personal history of other diseases of the digestive system: Z87.19

## 2015-03-16 HISTORY — DX: Pneumonia, unspecified organism: J18.9

## 2015-03-16 HISTORY — DX: Anxiety disorder, unspecified: F41.9

## 2015-03-16 HISTORY — DX: Tobacco use: Z72.0

## 2015-03-16 HISTORY — DX: Depression, unspecified: F32.A

## 2015-03-16 HISTORY — DX: Unspecified osteoarthritis, unspecified site: M19.90

## 2015-03-16 LAB — RAPID URINE DRUG SCREEN, HOSP PERFORMED
AMPHETAMINES: NOT DETECTED
Barbiturates: NOT DETECTED
Benzodiazepines: POSITIVE — AB
COCAINE: NOT DETECTED
OPIATES: POSITIVE — AB
TETRAHYDROCANNABINOL: POSITIVE — AB

## 2015-03-16 LAB — BASIC METABOLIC PANEL
Anion gap: 10 (ref 5–15)
CALCIUM: 9.1 mg/dL (ref 8.9–10.3)
CO2: 24 mmol/L (ref 22–32)
CREATININE: 0.82 mg/dL (ref 0.44–1.00)
Chloride: 101 mmol/L (ref 101–111)
GFR calc Af Amer: >60 mL/min (ref >60)
Glucose, Bld: 205 mg/dL — ABNORMAL HIGH (ref 65–99)
Potassium: 3.9 mmol/L (ref 3.5–5.1)
Sodium: 135 mmol/L (ref 135–145)

## 2015-03-16 LAB — CBC WITH DIFFERENTIAL/PLATELET
Basophils Absolute: 0.1 10*3/uL (ref 0.0–0.1)
Basophils Relative: 1 % (ref 0–1)
EOS PCT: 3 % (ref 0–5)
Eosinophils Absolute: 0.3 10*3/uL (ref 0.0–0.7)
HEMATOCRIT: 45.4 % (ref 36.0–46.0)
Hemoglobin: 15 g/dL (ref 12.0–15.0)
LYMPHS PCT: 12 % (ref 12–46)
Lymphs Abs: 1.3 10*3/uL (ref 0.7–4.0)
MCH: 31 pg (ref 26.0–34.0)
MCHC: 33 g/dL (ref 30.0–36.0)
MCV: 93.8 fL (ref 78.0–100.0)
MONO ABS: 0.7 10*3/uL (ref 0.1–1.0)
MONOS PCT: 6 % (ref 3–12)
Neutro Abs: 8.3 10*3/uL — ABNORMAL HIGH (ref 1.7–7.7)
Neutrophils Relative %: 78 % — ABNORMAL HIGH (ref 43–77)
Platelets: 185 10*3/uL (ref 150–400)
RBC: 4.84 MIL/uL (ref 3.87–5.11)
RDW: 14.1 % (ref 11.5–15.5)
WBC: 10.6 10*3/uL — AB (ref 4.0–10.5)

## 2015-03-16 LAB — GLUCOSE, CAPILLARY: GLUCOSE-CAPILLARY: 163 mg/dL — AB (ref 65–99)

## 2015-03-16 LAB — CBG MONITORING, ED: GLUCOSE-CAPILLARY: 169 mg/dL — AB (ref 65–99)

## 2015-03-16 MED ORDER — INSULIN ASPART 100 UNIT/ML ~~LOC~~ SOLN: 0.0000 [IU] | Freq: Every day | SUBCUTANEOUS | Status: DC

## 2015-03-16 MED ORDER — METHADONE HCL 5 MG PO TABS
115.0000 mg | ORAL_TABLET | Freq: Every day | ORAL | Status: DC
  Administered 2015-03-17: 115 mg via ORAL
  Filled 2015-03-16: qty 23

## 2015-03-16 MED ORDER — IPRATROPIUM-ALBUTEROL 0.5-2.5 (3) MG/3ML IN SOLN: 3.0000 mL | RESPIRATORY_TRACT | Status: DC | PRN

## 2015-03-16 MED ORDER — IPRATROPIUM-ALBUTEROL 0.5-2.5 (3) MG/3ML IN SOLN
3.0000 mL | RESPIRATORY_TRACT | Status: DC
  Administered 2015-03-16 (×3): 3 mL via RESPIRATORY_TRACT
  Filled 2015-03-16 (×4): qty 3

## 2015-03-16 MED ORDER — ENOXAPARIN SODIUM 40 MG/0.4ML ~~LOC~~ SOLN
40.0000 mg | SUBCUTANEOUS | Status: DC
  Administered 2015-03-16 – 2015-03-18 (×3): 40 mg via SUBCUTANEOUS
  Filled 2015-03-16 (×5): qty 0.4

## 2015-03-16 MED ORDER — ALUM & MAG HYDROXIDE-SIMETH 200-200-20 MG/5ML PO SUSP: 30.0000 mL | Freq: Four times a day (QID) | ORAL | Status: DC | PRN

## 2015-03-16 MED ORDER — METHYLPREDNISOLONE SODIUM SUCC 125 MG IJ SOLR
60.0000 mg | Freq: Four times a day (QID) | INTRAMUSCULAR | Status: DC
  Administered 2015-03-16 – 2015-03-17 (×5): 60 mg via INTRAVENOUS
  Filled 2015-03-16: qty 0.96
  Filled 2015-03-16: qty 2
  Filled 2015-03-16 (×3): qty 0.96
  Filled 2015-03-16: qty 2
  Filled 2015-03-16: qty 0.96

## 2015-03-16 MED ORDER — FOLIC ACID 5 MG/ML IJ SOLN: 1.0000 mg | Freq: Every day | INTRAMUSCULAR | Status: DC

## 2015-03-16 MED ORDER — ONDANSETRON HCL 4 MG PO TABS
4.0000 mg | ORAL_TABLET | Freq: Four times a day (QID) | ORAL | Status: DC | PRN
  Administered 2015-03-17: 4 mg via ORAL
  Filled 2015-03-16: qty 1

## 2015-03-16 MED ORDER — DOCUSATE SODIUM 100 MG PO CAPS
100.0000 mg | ORAL_CAPSULE | Freq: Two times a day (BID) | ORAL | Status: DC
  Administered 2015-03-17 – 2015-03-19 (×5): 100 mg via ORAL
  Filled 2015-03-16 (×6): qty 1

## 2015-03-16 MED ORDER — NICOTINE 21 MG/24HR TD PT24
21.0000 mg | MEDICATED_PATCH | Freq: Every day | TRANSDERMAL | Status: DC
  Administered 2015-03-16 – 2015-03-19 (×4): 21 mg via TRANSDERMAL
  Filled 2015-03-16 (×4): qty 1

## 2015-03-16 MED ORDER — THIAMINE HCL 100 MG/ML IJ SOLN
100.0000 mg | Freq: Every day | INTRAMUSCULAR | Status: DC
  Administered 2015-03-16: 100 mg via INTRAVENOUS
  Filled 2015-03-16: qty 2
  Filled 2015-03-16: qty 1

## 2015-03-16 MED ORDER — ONDANSETRON HCL 4 MG/2ML IJ SOLN: 4.0000 mg | Freq: Four times a day (QID) | INTRAMUSCULAR | Status: DC | PRN

## 2015-03-16 MED ORDER — METHADONE HCL 10 MG PO TABS
110.0000 mg | ORAL_TABLET | Freq: Once | ORAL | Status: AC
  Filled 2015-03-16: qty 11

## 2015-03-16 MED ORDER — ALBUTEROL (5 MG/ML) CONTINUOUS INHALATION SOLN
10.0000 mg/h | INHALATION_SOLUTION | Freq: Once | RESPIRATORY_TRACT | Status: AC
  Administered 2015-03-16: 10 mg/h via RESPIRATORY_TRACT
  Filled 2015-03-16: qty 20

## 2015-03-16 MED ORDER — DULOXETINE HCL 60 MG PO CPEP
60.0000 mg | ORAL_CAPSULE | Freq: Two times a day (BID) | ORAL | Status: DC
  Administered 2015-03-16 – 2015-03-19 (×7): 60 mg via ORAL
  Filled 2015-03-16 (×9): qty 1

## 2015-03-16 MED ORDER — LORAZEPAM 2 MG/ML IJ SOLN
0.5000 mg | Freq: Four times a day (QID) | INTRAMUSCULAR | Status: DC | PRN
  Administered 2015-03-16 – 2015-03-18 (×5): 0.5 mg via INTRAVENOUS
  Filled 2015-03-16 (×6): qty 1

## 2015-03-16 MED ORDER — SODIUM CHLORIDE 0.9 % IV SOLN
INTRAVENOUS | Status: DC
  Administered 2015-03-16: 12:00:00 via INTRAVENOUS

## 2015-03-16 MED ORDER — GABAPENTIN 400 MG PO CAPS
800.0000 mg | ORAL_CAPSULE | Freq: Three times a day (TID) | ORAL | Status: DC
  Administered 2015-03-16 – 2015-03-19 (×9): 800 mg via ORAL
  Filled 2015-03-16 (×12): qty 2

## 2015-03-16 MED ORDER — LEVOFLOXACIN IN D5W 750 MG/150ML IV SOLN
750.0000 mg | INTRAVENOUS | Status: DC
  Administered 2015-03-16 – 2015-03-18 (×3): 750 mg via INTRAVENOUS
  Filled 2015-03-16 (×3): qty 150

## 2015-03-16 MED ORDER — HYDRALAZINE HCL 20 MG/ML IJ SOLN: 5.0000 mg | Freq: Four times a day (QID) | INTRAMUSCULAR | Status: DC | PRN

## 2015-03-16 MED ORDER — INSULIN ASPART 100 UNIT/ML ~~LOC~~ SOLN
0.0000 [IU] | Freq: Three times a day (TID) | SUBCUTANEOUS | Status: DC
  Administered 2015-03-16 – 2015-03-17 (×2): 2 [IU] via SUBCUTANEOUS
  Administered 2015-03-18: 1 [IU] via SUBCUTANEOUS
  Filled 2015-03-16: qty 1

## 2015-03-16 MED ORDER — PANTOPRAZOLE SODIUM 40 MG PO TBEC
40.0000 mg | DELAYED_RELEASE_TABLET | Freq: Every day | ORAL | Status: DC
  Administered 2015-03-16 – 2015-03-19 (×4): 40 mg via ORAL
  Filled 2015-03-16 (×4): qty 1

## 2015-03-16 MED ORDER — METHYLPREDNISOLONE SODIUM SUCC 125 MG IJ SOLR
125.0000 mg | Freq: Once | INTRAMUSCULAR | Status: AC
  Administered 2015-03-16: 125 mg via INTRAVENOUS
  Filled 2015-03-16: qty 2

## 2015-03-16 MED ORDER — IPRATROPIUM BROMIDE 0.02 % IN SOLN
1.0000 mg | Freq: Once | RESPIRATORY_TRACT | Status: AC
  Administered 2015-03-16: 1 mg via RESPIRATORY_TRACT
  Filled 2015-03-16: qty 5

## 2015-03-16 MED ORDER — IPRATROPIUM-ALBUTEROL 0.5-2.5 (3) MG/3ML IN SOLN
3.0000 mL | Freq: Four times a day (QID) | RESPIRATORY_TRACT | Status: DC
  Administered 2015-03-17 – 2015-03-19 (×8): 3 mL via RESPIRATORY_TRACT
  Filled 2015-03-16 (×9): qty 3

## 2015-03-16 MED ORDER — FOLIC ACID 5 MG/ML IJ SOLN
1.0000 mg | Freq: Every day | INTRAMUSCULAR | Status: DC
  Administered 2015-03-16: 1 mg via INTRAVENOUS
  Filled 2015-03-16 (×3): qty 0.2

## 2015-03-16 MED ORDER — AMLODIPINE BESYLATE 5 MG PO TABS
5.0000 mg | ORAL_TABLET | Freq: Every day | ORAL | Status: DC
  Administered 2015-03-16 – 2015-03-19 (×4): 5 mg via ORAL
  Filled 2015-03-16 (×4): qty 1
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
## 2015-03-16 NOTE — Progress Notes (Signed)
Called report to ED.

## 2015-03-16 NOTE — ED Notes (Signed)
Attempted report 

## 2015-03-16 NOTE — Progress Notes (Signed)
Emily Moran is a 59 y.o. female patient admitted from ED awake, alert - oriented  X 4 - no acute distress noted.  VSS - Blood pressure 115/80, pulse 100, temperature 97.4 F (36.3 C), temperature source Oral, resp. rate 30, height '5\' 6"'$  (1.676 m), weight 56.7 kg (125 lb), SpO2 91 %.    IV in place, occlusive dsg intact without redness.  Orientation to room, and floor completed with information packet given to patient/family. Admission INP armband ID verified with patient/family, and in place.   SR up x 2, fall assessment complete, with patient and family able to verbalize understanding of risk associated with falls, and verbalized understanding to call nsg before up out of bed.  Call light within reach, patient able to voice, and demonstrate understanding.      Will cont to eval and treat per MD orders.  Henriette Combs, [place] 03/16/2015 6:56 PM

## 2015-03-16 NOTE — ED Notes (Addendum)
Shortness of breath, fever, productive cough, thick white sputum x 4 days.  89-90% on 2.5 liters nasal cannula. COPD

## 2015-03-16 NOTE — ED Notes (Signed)
Admitting provider at bedside.

## 2015-03-16 NOTE — ED Provider Notes (Signed)
CSN: [id number]     Arrival date & time 03/16/15  9735 History   First MD Initiated Contact with Patient 03/16/15 [phone]     Chief Complaint  Patient presents with  . Shortness of Breath     HPI Pt was seen at 0710.  Per pt, c/o gradual onset and worsening of persistent cough, wheezing and SOB for the past 3-4 days.  Describes her symptoms as "my COPD might be acting up." Has been associated with subjective home fevers/chills.  Has been using home MDI without relief.  Denies CP/palpitations, no back pain, no abd pain, no N/V/D, no objective fevers, no rash.     Past Medical History  Diagnosis Date  . COPD (chronic obstructive pulmonary disease)   . Degenerative disc disease   . Spinal stenosis   . DDD (degenerative disc disease)   . DDD (degenerative disc disease)   . Hypertension   . Degenerative disc disease   . DDD (degenerative disc disease), lumbar   . On home O2     qhs 2.5-3L prn  . Tobacco use   . Chronic pain   . Pain management    Past Surgical History  Procedure Laterality Date  . Neck surgery    . Abdominal hysterectomy    . Bladder surgery    . Femur im nail Right 11/28/2012    Procedure: INTRAMEDULLARY (IM) RETROGRADE FEMORAL NAILING wants jackson table , c-arm and biomet ;  Surgeon: Mauri Pole, MD;  Location: [facility];  Service: Orthopedics;  Laterality: Right;    History  Substance Use Topics  . Smoking status: Current Every Day Smoker -- 0.10 packs/day    Types: Cigarettes  . Smokeless tobacco: Never Used  . Alcohol Use: No     Comment: History of ETOH abuse    Review of Systems ROS: Statement: All systems negative except as marked or noted in the HPI; Constitutional: Negative for fever and +"chills." ; ; Eyes: Negative for eye pain, redness and discharge. ; ; ENMT: Negative for ear pain, hoarseness, nasal congestion, sinus pressure and sore throat. ; ; Cardiovascular: Negative for chest pain, palpitations, diaphoresis, and peripheral edema. ; ;  Respiratory: +cough, wheezing, SOB. Negative for stridor. ; ; Gastrointestinal: Negative for nausea, vomiting, diarrhea, abdominal pain, blood in stool, hematemesis, jaundice and rectal bleeding. . ; ; Genitourinary: Negative for dysuria, flank pain and hematuria. ; ; Musculoskeletal: Negative for back pain and neck pain. Negative for swelling and trauma.; ; Skin: Negative for pruritus, rash, abrasions, blisters, bruising and skin lesion.; ; Neuro: Negative for headache, lightheadedness and neck stiffness. Negative for weakness, altered level of consciousness , altered mental status, extremity weakness, paresthesias, involuntary movement, seizure and syncope.      Allergies  Aspirin  Home Medications   Prior to Admission medications   Medication Sig Start Date End Date Taking? Authorizing Provider  albuterol (PROVENTIL HFA;VENTOLIN HFA) 108 (90 BASE) MCG/ACT inhaler Inhale 2 puffs into the lungs every 6 (six) hours as needed for wheezing or shortness of breath.    Yes Historical Provider, MD  DULoxetine (CYMBALTA) 60 MG capsule Take 60 mg by mouth 2 (two) times daily.   Yes Historical Provider, MD  Fluticasone-Salmeterol (ADVAIR DISKUS) 250-50 MCG/DOSE AEPB Inhale 1 puff into the lungs 2 (two) times daily. Patient taking differently: Inhale 1 puff into the lungs once as needed (Shortness of breath).  05/07/14  Yes Kathie Dike, MD  gabapentin (NEURONTIN) 800 MG tablet Take 800 mg by mouth 3 (  three) times daily.   Yes Historical Provider, MD  tiotropium (SPIRIVA HANDIHALER) 18 MCG inhalation capsule Place 1 capsule (18 mcg total) into inhaler and inhale daily. Patient taking differently: Place 18 mcg into inhaler and inhale daily as needed (shortness of breathing).  05/07/14  Yes Kathie Dike, MD  tiZANidine (ZANAFLEX) 4 MG tablet Take 4 mg by mouth 2 (two) times daily.   Yes Historical Provider, MD  albuterol (PROVENTIL) (2.5 MG/3ML) 0.083% nebulizer solution Take 3 mLs (2.5 mg total) by  nebulization every 6 (six) hours as needed for wheezing or shortness of breath. Patient not taking: Reported on 03/16/2015 05/07/14   Kathie Dike, MD  gabapentin (NEURONTIN) 600 MG tablet Take 600 mg by mouth 3 (three) times daily.    Historical Provider, MD  levofloxacin (LEVAQUIN) 500 MG tablet Take 1 tablet (500 mg total) by mouth daily. Patient not taking: Reported on 09/14/2014 05/07/14   Kathie Dike, MD  nicotine polacrilex (EQ NICOTINE) 2 MG gum Take 1 each (2 mg total) by mouth as needed for smoking cessation. Patient not taking: Reported on 09/14/2014 05/07/14   Kathie Dike, MD  orphenadrine (NORFLEX) 100 MG tablet Take 1 tablet (100 mg total) by mouth 2 (two) times daily. Patient not taking: Reported on 02/15/2015 09/14/14   Charlesetta Shanks, MD  predniSONE (DELTASONE) 10 MG tablet Take '40mg'$  po daily for 2 days, then '30mg'$  po daily for 2 days then '20mg'$  po daily for 2days then '10mg'$  po daily for 2 days then stop Patient not taking: Reported on 09/14/2014 05/07/14   Kathie Dike, MD  predniSONE (DELTASONE) 20 MG tablet 3 tabs po daily x 3 days, then 2 tabs x 3 days, then 1.5 tabs x 3 days, then 1 tab x 3 days, then 0.5 tabs x 3 days Patient not taking: Reported on 02/15/2015 09/14/14   Charlesetta Shanks, MD   BP 161/107 mmHg  Pulse 79  Temp(Src) 97.4 F (36.3 C) (Oral)  Resp 18  Ht '5\' 6"'$  (1.676 m)  Wt 125 lb (56.7 kg)  BMI 20.19 kg/m2  SpO2 92% Physical Exam  0715: Physical examination:  Nursing notes reviewed; Vital signs and O2 SAT reviewed;  Constitutional: Well developed, Well nourished, Well hydrated, Uncomfortable appearing.; Head:  Normocephalic, atraumatic; Eyes: EOMI, PERRL, No scleral icterus; ENMT: Mouth and pharynx normal, Mucous membranes moist; Neck: Supple, Full range of motion, No lymphadenopathy; Cardiovascular: Regular rate and rhythm, No gallop; Respiratory: Breath sounds diminished & equal bilaterally, faint scattered wheezes. No audible wheezing. Sitting upright,  tachypneic. Speaking sentences. No retrax.;; Chest: Nontender, Movement normal; Abdomen: Soft, Nontender, Nondistended, Normal bowel sounds; Genitourinary: No CVA tenderness; Extremities: Pulses normal, No tenderness, No edema, No calf edema or asymmetry.; Neuro: AA&Ox3, Major CN grossly intact.  Speech clear. No gross focal motor or sensory deficits in extremities.; Skin: Color normal, Warm, Dry.   ED Course  Procedures     EKG Interpretation None      MDM  MDM Reviewed: previous chart, nursing note and vitals Reviewed previous: labs Interpretation: labs and x-ray Total time providing critical care: 30-74 minutes. This excludes time spent performing separately reportable procedures and services. Consults: admitting MD     CRITICAL CARE Performed by: Alfonzo Feller Total critical care time: 35 Critical care time was exclusive of separately billable procedures and treating other patients. Critical care was necessary to treat or prevent imminent or life-threatening deterioration. Critical care was time spent personally by me on the following activities: development of treatment plan with patient  and/or surrogate as well as nursing, discussions with consultants, evaluation of patient's response to treatment, examination of patient, obtaining history from patient or surrogate, ordering and performing treatments and interventions, ordering and review of laboratory studies, ordering and review of radiographic studies, pulse oximetry and re-evaluation of patient's condition.   Results for orders placed or performed during the hospital encounter of 41/66/06  Basic metabolic panel  Result Value Ref Range   Sodium 135 135 - 145 mmol/L   Potassium 3.9 3.5 - 5.1 mmol/L   Chloride 101 101 - 111 mmol/L   CO2 24 22 - 32 mmol/L   Glucose, Bld 205 (H) 65 - 99 mg/dL   BUN <5 (L) 6 - 20 mg/dL   Creatinine, Ser 0.82 0.44 - 1.00 mg/dL   Calcium 9.1 8.9 - 10.3 mg/dL   GFR calc non Af Amer >60  >60 mL/min   GFR calc Af Amer >60 >60 mL/min   Anion gap 10 5 - 15  CBC with Differential  Result Value Ref Range   WBC 10.6 (H) 4.0 - 10.5 K/uL   RBC 4.84 3.87 - 5.11 MIL/uL   Hemoglobin 15.0 12.0 - 15.0 g/dL   HCT 45.4 36.0 - 46.0 %   MCV 93.8 78.0 - 100.0 fL   MCH 31.0 26.0 - 34.0 pg   MCHC 33.0 30.0 - 36.0 g/dL   RDW 14.1 11.5 - 15.5 %   Platelets 185 150 - 400 K/uL   Neutrophils Relative % 78 (H) 43 - 77 %   Neutro Abs 8.3 (H) 1.7 - 7.7 K/uL   Lymphocytes Relative 12 12 - 46 %   Lymphs Abs 1.3 0.7 - 4.0 K/uL   Monocytes Relative 6 3 - 12 %   Monocytes Absolute 0.7 0.1 - 1.0 K/uL   Eosinophils Relative 3 0 - 5 %   Eosinophils Absolute 0.3 0.0 - 0.7 K/uL   Basophils Relative 1 0 - 1 %   Basophils Absolute 0.1 0.0 - 0.1 K/uL   Dg Chest 2 View 03/16/2015   CLINICAL DATA:  Cough, shortness of breath, smoker for 45 years, 1 pack per day  EXAM: CHEST  2 VIEW  COMPARISON:  CT chest 05/07/2014  FINDINGS: The lungs are hyperinflated likely secondary to COPD. There is severe right upper lobe bullous disease. There is no focal parenchymal opacity. There is no pleural effusion or pneumothorax. The heart and mediastinal contours are unremarkable.  The osseous structures are unremarkable.  IMPRESSION: No active cardiopulmonary disease.   Electronically Signed   By: Kathreen Devoid   On: 03/16/2015 08:05    1130:  On arrival: pt's O2 Sats were 88% R/A. IV solumedrol and hour long neb given. Pt ambulated after neb completed: Sats dropped to 79% R/A and pt c/o increasing SOB and RR. O2 N/C reapplied with Sats increasing to 92%. Dx and testing d/w pt.  Questions answered.  Verb understanding, agreeable to admit. T/C to [facility] Lissa Merlin, case discussed, including:  HPI, pertinent PM/SHx, VS/PE, dx testing, ED course and treatment:  Agreeable to admit, requests they will come to the ED for evaluation.   Francine Graven, DO 03/17/15 1610

## 2015-03-17 DIAGNOSIS — J441 Chronic obstructive pulmonary disease with (acute) exacerbation: Principal | ICD-10-CM

## 2015-03-17 DIAGNOSIS — J9601 Acute respiratory failure with hypoxia: Secondary | ICD-10-CM

## 2015-03-17 DIAGNOSIS — I10 Essential (primary) hypertension: Secondary | ICD-10-CM

## 2015-03-17 LAB — RESPIRATORY VIRUS PANEL
ADENOVIRUS: NEGATIVE
INFLUENZA A: NEGATIVE
Influenza B: NEGATIVE
Metapneumovirus: NEGATIVE
PARAINFLUENZA 1 A: NEGATIVE
PARAINFLUENZA 2 A: NEGATIVE
PARAINFLUENZA 3 A: NEGATIVE
RESPIRATORY SYNCYTIAL VIRUS A: NEGATIVE
Respiratory Syncytial Virus B: NEGATIVE
Rhinovirus: POSITIVE — AB

## 2015-03-17 LAB — HEMOGLOBIN A1C
Hgb A1c MFr Bld: 5.3 % (ref 4.8–5.6)
Mean Plasma Glucose: 105 mg/dL

## 2015-03-17 LAB — GLUCOSE, CAPILLARY
GLUCOSE-CAPILLARY: 102 mg/dL — AB (ref 65–99)
Glucose-Capillary: 81 mg/dL (ref 65–99)
Glucose-Capillary: 167 mg/dL — ABNORMAL HIGH (ref 65–99)
Glucose-Capillary: 123 mg/dL — ABNORMAL HIGH (ref 65–99)

## 2015-03-17 MED ORDER — FOLIC ACID 1 MG PO TABS
1.0000 mg | ORAL_TABLET | Freq: Every day | ORAL | Status: DC
  Administered 2015-03-17 – 2015-03-19 (×3): 1 mg via ORAL
  Filled 2015-03-17 (×3): qty 1

## 2015-03-17 MED ORDER — METHADONE HCL 10 MG PO TABS
110.0000 mg | ORAL_TABLET | Freq: Every day | ORAL | Status: DC
  Administered 2015-03-18 – 2015-03-19 (×2): 110 mg via ORAL
  Filled 2015-03-17 (×3): qty 11

## 2015-03-17 MED ORDER — CETYLPYRIDINIUM CHLORIDE 0.05 % MT LIQD
7.0000 mL | Freq: Two times a day (BID) | OROMUCOSAL | Status: DC
  Administered 2015-03-17 – 2015-03-19 (×5): 7 mL via OROMUCOSAL

## 2015-03-17 MED ORDER — DIPHENHYDRAMINE HCL 25 MG PO CAPS
25.0000 mg | ORAL_CAPSULE | Freq: Once | ORAL | Status: AC
Start: 2015-03-17 — End: 2015-03-17
  Administered 2015-03-17: 25 mg via ORAL
  Filled 2015-03-17: qty 1

## 2015-03-17 MED ORDER — ACETAMINOPHEN 325 MG PO TABS
650.0000 mg | ORAL_TABLET | Freq: Four times a day (QID) | ORAL | Status: DC | PRN
Start: 2015-03-17 — End: 2015-03-19
  Administered 2015-03-17: 650 mg via ORAL
  Filled 2015-03-17: qty 2

## 2015-03-17 MED ORDER — SODIUM CHLORIDE 0.9 % IJ SOLN: 10.0000 mL | INTRAMUSCULAR | Status: DC | PRN

## 2015-03-17 MED ORDER — HYDRALAZINE HCL 20 MG/ML IJ SOLN: 5.0000 mg | Freq: Four times a day (QID) | INTRAMUSCULAR | Status: DC | PRN

## 2015-03-17 MED ORDER — VITAMIN B-1 100 MG PO TABS
100.0000 mg | ORAL_TABLET | Freq: Every day | ORAL | Status: DC
  Administered 2015-03-17 – 2015-03-19 (×3): 100 mg via ORAL
  Filled 2015-03-17 (×3): qty 1

## 2015-03-17 MED ORDER — ENSURE ENLIVE PO LIQD
237.0000 mL | Freq: Two times a day (BID) | ORAL | Status: DC
  Administered 2015-03-17 – 2015-03-19 (×4): 237 mL via ORAL

## 2015-03-17 MED ORDER — METHADONE HCL 5 MG PO TABS
5.0000 mg | ORAL_TABLET | Freq: Every day | ORAL | Status: DC
Start: 2015-03-17 — End: 2015-03-19
  Administered 2015-03-18 – 2015-03-19 (×2): 5 mg via ORAL
  Filled 2015-03-17 (×3): qty 1

## 2015-03-17 MED ORDER — METHYLPREDNISOLONE SODIUM SUCC 125 MG IJ SOLR
60.0000 mg | Freq: Three times a day (TID) | INTRAMUSCULAR | Status: DC
  Administered 2015-03-17 – 2015-03-18 (×3): 60 mg via INTRAVENOUS
  Filled 2015-03-17 (×3): qty 0.96

## 2015-03-17 MED ORDER — METHADONE HCL 10 MG PO TABS: 115.0000 mg | ORAL_TABLET | Freq: Every day | ORAL | Status: DC

## 2015-03-17 NOTE — Evaluation (Signed)
Physical Therapy Evaluation Patient Details Name: Emily Moran MRN: [id number] DOB: Feb 11, 1956 Today's Date: 03/17/2015   History of Present Illness  59 y.o. female admitted for Acute respiratory failure with hypoxia / COPD with acute exacerbation / possible lobar pneumonia, unspecified / leukocytosis  Clinical Impression  Pt admitted with above complications. Pt currently with functional limitations due to the deficits listed below (see PT Problem List). Ambulates generally well without an assistive device. SpO2 maintained 93% on 4L supplemental O2 at rest and during ambulatory bout with minimal dyspnea reported. States she has 24/7 assistance available from family as needed. Patient may require supplemental O2 at d/c pending her recovery. Will continue to monitor and update recommendations appropriately. Pt will benefit from skilled PT to increase their independence and safety with mobility to allow discharge to the venue listed below.       Follow Up Recommendations No PT follow up    Equipment Recommendations  None recommended by PT    Recommendations for Other Services       Precautions / Restrictions Precautions Precautions: None Precaution Comments: monitor O2 Restrictions Weight Bearing Restrictions: No      Mobility  Bed Mobility Overal bed mobility: Modified Independent                Transfers Overall transfer level: Needs assistance Equipment used: None Transfers: Sit to/from Stand Sit to Stand: Supervision         General transfer comment: supervision for safety. no assist needed. Mild sway noted however did not reach for furniture to correct.  Ambulation/Gait Ambulation/Gait assistance: Supervision Ambulation Distance (Feet): 225 Feet Assistive device: None Gait Pattern/deviations: Step-through pattern;Decreased dorsiflexion - right;Drifts right/left;Antalgic Gait velocity: decreased   General Gait Details: SpO2 93% on 4L supplemental O2 while  ambulating.  Demonstrates mild Rt foot drop but compensates with good clearance during swing phase of gait cycle. No over loss of balance, but some drifting noted at times, able to self correct. Supervision provided for safety. Mildy antalgic  Stairs Stairs: Yes Stairs assistance: Supervision Stair Management: One rail Right;Alternating pattern;Step to pattern;Forwards Number of Stairs: 3 General stair comments: VC for sequencing, pt prefers alternating step pattern to ascend and step-to for descent. Relies on Rail but performed this task adqautely without need for physical assistance.  Wheelchair Mobility    Modified Rankin (Stroke Patients Only)       Balance Overall balance assessment: Needs assistance Sitting-balance support: No upper extremity supported;Feet supported Sitting balance-Leahy Scale: Good     Standing balance support: No upper extremity supported Standing balance-Leahy Scale: Good                               Pertinent Vitals/Pain Pain Assessment: No/denies pain    Home Living Family/patient expects to be discharged to:: Private residence Living Arrangements: Children;Non-relatives/Friends Available Help at Discharge: Family;Available 24 hours/day Type of Home: House Home Access: Stairs to enter Entrance Stairs-Rails: Psychiatric nurse of Steps: 3 Home Layout: Two level;Able to live on main level with bedroom/bathroom Home Equipment: None      Prior Function Level of Independence: Independent               Hand Dominance   Dominant Hand: Right    Extremity/Trunk Assessment   Upper Extremity Assessment: Defer to OT evaluation           Lower Extremity Assessment: Generalized weakness (Hx of RLE weakness)  Communication   Communication: No difficulties  Cognition Arousal/Alertness: Awake/alert Behavior During Therapy: WFL for tasks assessed/performed Overall Cognitive Status: Within Functional  Limits for tasks assessed                      General Comments General comments (skin integrity, edema, etc.): At rest on 4L supplemental O2 SpO2 was 93%, ambulating on 4L SpO2 maintained 93%. Pt reported mild dyspnea while ambulating. Educated on pursed lip breathing technique.    Exercises General Exercises - Lower Extremity Ankle Circles/Pumps: AROM;Both;5 reps;Seated Long Arc Quad: Strengthening;Both;5 reps;Seated Hip Flexion/Marching: Strengthening;Both;5 reps;Seated      Assessment/Plan    PT Assessment Patient needs continued PT services  PT Diagnosis Abnormality of gait   PT Problem List Decreased strength;Decreased activity tolerance;Decreased balance;Decreased mobility;Decreased coordination;Decreased knowledge of use of DME;Cardiopulmonary status limiting activity;Pain  PT Treatment Interventions DME instruction;Gait training;Functional mobility training;Therapeutic activities;Therapeutic exercise;Balance training;Neuromuscular re-education;Patient/family education;Modalities   PT Goals (Current goals can be found in the Care Plan section) Acute Rehab PT Goals Patient Stated Goal: none stated PT Goal Formulation: With patient Time For Goal Achievement: 03/31/15 Potential to Achieve Goals: Good    Frequency Min 3X/week   Barriers to discharge        Co-evaluation               End of Session Equipment Utilized During Treatment: Oxygen Activity Tolerance: Patient tolerated treatment well Patient left: in bed;with call bell/phone within reach Nurse Communication: Mobility status         Time: 0459-9774 PT Time Calculation (min) (ACUTE ONLY): 19 min   Charges:   PT Evaluation $Initial PT Evaluation Tier I: 1 Procedure     PT G CodesEllouise Newer 03/17/2015, 2:55 PM Camille Bal [place], [place]

## 2015-03-17 NOTE — Progress Notes (Signed)
Initial Nutrition Assessment  DOCUMENTATION CODES:  Not applicable  INTERVENTION:  Ensure Enlive (each supplement provides 350kcal and 20 grams of protein)  NUTRITION DIAGNOSIS:  Inadequate oral intake related to other (see comment) (decreased appetite) as evidenced by per patient/family report.   GOAL:  Patient will meet greater than or equal to 90% of their needs   MONITOR:  PO intake, Supplement acceptance, Labs, Weight trends, Skin, I & O's  REASON FOR ASSESSMENT:  Consult Assessment of nutrition requirement/status  ASSESSMENT: 59 year old female with past medical history of COPD on home oxygen, current smoker, hypertension, history of substance abuse (marijuana, cocaine in past). Patient [facility] with worsening shortness of breath over past 1 week prior to this admission.   Pt admitted with acute respiratory failure vs COPD exacerbation. Pt has hx of polysubstance abuse.   Pt reports poor appetite over the past month. Typical diet recall is two meals per day: egg sandwich at breakfast and hot dog for dinner. Pt reports she mainly drinks Coke as a beverage. However, she reports her appetite is returning and consumed approximately 75% of her breakfast.   Pt suspects that she has lost weight due to decreased appetite. She reveals UBW of 125#, which is confirmed by bedscale. No significant wt changes per wt hx.   Nutrition-Focused physical exam completed. Findings are mild fat depletion, mild muscle depletion, and no edema. Pt reports her legs are always thin and has seen no difference in the way her clothing fits. However, she is concerned about her weight and is requesting Ensure supplements. RD to order.   Discussed importance of good meal and supplement intake to promote healing.  Height:  Ht Readings from Last 1 Encounters:  03/16/15 '5\' 6"'$  (1.676 m)    Weight:  Wt Readings from Last 1 Encounters:  03/16/15 125 lb (56.7 kg)    Ideal  Body Weight:  59 kg  Wt Readings from Last 10 Encounters:  03/16/15 125 lb (56.7 kg)  02/15/15 125 lb (56.7 kg)  09/14/14 118 lb (53.524 kg)  05/07/14 125 lb 3.5 oz (56.8 kg)  12/30/13 125 lb (56.7 kg)  11/28/12 125 lb (56.7 kg)  05/14/11 131 lb 4.8 oz (59.557 kg)  12/17/07 133 lb 0.8 oz (60.351 kg)  09/11/07 121 lb 0.8 oz (54.908 kg)    BMI:  Body mass index is 20.19 kg/(m^2).  Estimated Nutritional Needs:  Kcal:  1500-1700  Protein:  65-75 grams  Fluid:  1.5-1.7 L  Skin:  Reviewed, no issues  Diet Order:  Diet Heart Room service appropriate?: Yes; Fluid consistency:: Thin  EDUCATION NEEDS:  Education needs addressed   Intake/Output Summary (Last 24 hours) at 03/17/15 1544 Last data filed at 03/17/15 1353  Gross per 24 hour  Intake    720 ml  Output   2300 ml  Net  -1580 ml    Last BM:  03/15/15  [place]in[place] Ingle A. Jimmye Norman, RD, LDN, CDE Pager: [phone] After hours Pager: [phone]

## 2015-03-17 NOTE — Progress Notes (Signed)
Chaplain responded to consult that pt requested prayer.  Pt shared that she feels closer to end of life and at first said she is "fine with that."  However, pt later shared her feelings of guilt over past life events and that she struggles to forgive self.  Pt also shared that she worries what will happen to her 57 and 57 year old children.  While she feels secure in their salvation with regards to Christianity, she expressed concern over their "futures."  Chaplain provided emotional and spiritual support as well as ministry of prayer.  Pt expressed appreciation of chaplain support and requested follow up.  Chaplain will follow up as needed.    03/17/15 1100  Clinical Encounter Type  Visited With Patient  Visit Type Initial;Psychological support;Spiritual support;Social support  Referral From Nurse  Spiritual Encounters  Spiritual Needs Prayer;Emotional  Stress Factors  Patient Stress Factors Exhausted;Family relationships;Health changes   Geralyn Flash 03/17/2015 11:37 AM

## 2015-03-17 NOTE — Progress Notes (Signed)
PROGRESS NOTE  ARYIANA KLINKNER LPF:[id number] DOB: 1955-10-22 DOA: 03/16/2015 PCP: Philis Fendt, MD  Brief History 59 year old female with past medical history of COPD on home oxygen (3L at night), current smoker, hypertension, history of substance abuse (marijuana, cocaine in past). Patient presented to [facility] with worsening shortness of breath over 1 week prior to this admission. Shortness of breath is worse with exertion but is present at rest as well. Patient reported associated chronic cough with whitish sputum, subjective fevers and chills.  Assessment/Plan:  Acute respiratory failure with hypoxia / COPD with acute exacerbation - Patient presented with hypoxia, oxygen saturation 79% on room air. It has improved to 92% with nasal cannula oxygen support. - Chest x-ray on the admission showed severe right upper lobe bullous disease - Continue nebulizer treatment, duoneb every 6 hours as scheduled and every 2 hours as needed for shortness of breath or wheezing. - Decrease Solu-Medrol 60 mg IV every 8 hours. - Patient was placed on empiric Levaquin -Patient is normally on 3 L nasal cannula at nighttime  Active Problems:  TOBACCO ABUSE - Nicotine patch ordered -pt has 30 pk year history -pt using E-cigarette presently   Substance abuse - Previous UDS remarkable for marijuana, cocaine.  -Check UDS on this admission--positive THC, Benzo - Continue methadone--pt is on methadone from [facility]   Accelerated hypertension - Systolic blood pressure in 180s on the admission.  -start Norvasc 5 mg daily.  - Added hydralazine for better blood pressure control if blood pressure stays 140/90 after receiving Norvasc.   Hyperglycemia  - Likely steroid-induced  - Continue sliding scale insulin  - Check A1c--5.3  Chronic hepatitis C without coma -Compensated at this time- -Has never been treated -Outpatient follow-up will be needed  DVT  prophylaxis:  - SCD's bilaterally    Family Communication:   Pt at beside Disposition Plan:   Home when medically stable       Procedures/Studies: Dg Chest 2 View  03/16/2015   CLINICAL DATA:  Cough, shortness of breath, smoker for 45 years, 1 pack per day  EXAM: CHEST  2 VIEW  COMPARISON:  CT chest 05/07/2014  FINDINGS: The lungs are hyperinflated likely secondary to COPD. There is severe right upper lobe bullous disease. There is no focal parenchymal opacity. There is no pleural effusion or pneumothorax. The heart and mediastinal contours are unremarkable.  The osseous structures are unremarkable.  IMPRESSION: No active cardiopulmonary disease.   Electronically Signed   By: Kathreen Devoid   On: 03/16/2015 08:05         Subjective: Patient states that she is breathing 25% better. Denies any fevers, chills, chest pain, nausea, vomiting, diarrhea, dysuria, hematuria. No abdominal pain.  Objective: Filed Vitals:   03/17/15 0835 03/17/15 1024 03/17/15 1220 03/17/15 1354  BP:  115/67  133/77  Pulse:  93  98  Temp:  98.7 F (37.1 C)  98 F (36.7 C)  TempSrc:  Oral  Oral  Resp:  16  18  Height:      Weight:      SpO2: 93% 95% 90% 94%    Intake/Output Summary (Last 24 hours) at 03/17/15 1532 Last data filed at 03/17/15 1353  Gross per 24 hour  Intake    720 ml  Output   2300 ml  Net  -1580 ml   Weight change:  Exam:   General:  Pt is alert, follows commands appropriately,  not in acute distress  HEENT: No icterus, No thrush, No neck mass, [place]/AT  Cardiovascular: RRR, S1/S2, no rubs, no gallops  Respiratory: CTA bilaterally, no wheezing, no crackles, no rhonchi  Abdomen: Soft/+BS, non tender, non distended, no guarding  Extremities: No edema, No lymphangitis, No petechiae, No rashes, no synovitis  Data Reviewed: Basic Metabolic Panel:  Recent Labs Lab 03/16/15 0726  NA 135  K 3.9  CL 101  CO2 24  GLUCOSE 205*  BUN <5*  CREATININE 0.82  CALCIUM 9.1    Liver Function Tests: No results for input(s): AST, ALT, ALKPHOS, BILITOT, PROT, ALBUMIN in the last 168 hours. No results for input(s): LIPASE, AMYLASE in the last 168 hours. No results for input(s): AMMONIA in the last 168 hours. CBC:  Recent Labs Lab 03/16/15 0726  WBC 10.6*  NEUTROABS 8.3*  HGB 15.0  HCT 45.4  MCV 93.8  PLT 185   Cardiac Enzymes: No results for input(s): CKTOTAL, CKMB, CKMBINDEX, TROPONINI in the last 168 hours. BNP: Invalid input(s): POCBNP CBG:  Recent Labs Lab 03/16/15 1218 03/16/15 2052 03/17/15 0803 03/17/15 1207  GLUCAP 169* 163* 102* 81    No results found for this or any previous visit (from the past 240 hour(s)).   Scheduled Meds: . amLODipine  5 mg Oral Daily  . antiseptic oral rinse  7 mL Mouth Rinse BID  . docusate sodium  100 mg Oral BID  . DULoxetine  60 mg Oral BID  . enoxaparin (LOVENOX) injection  40 mg Subcutaneous Q24H  . folic acid  1 mg Oral Daily  . gabapentin  800 mg Oral TID  . insulin aspart  0-5 Units Subcutaneous QHS  . insulin aspart  0-9 Units Subcutaneous TID WC  . ipratropium-albuterol  3 mL Nebulization QID  . levofloxacin (LEVAQUIN) IV  750 mg Intravenous Q24H  . methadone  110 mg Oral Daily  . methadone  5 mg Oral Daily  . methylPREDNISolone (SOLU-MEDROL) injection  60 mg Intravenous Q6H  . nicotine  21 mg Transdermal Daily  . pantoprazole  40 mg Oral Daily  . thiamine  100 mg Oral Daily   Continuous Infusions: . sodium chloride 10 mL/hr at 03/16/15 1223     Kristle Wesch, DO  [facility] Pager [phone]  If 7PM-7AM, please contact night-coverage [email or web address] Password [email or web address] 03/17/2015, 3:32 PM   LOS: 1 day

## 2015-03-17 NOTE — Progress Notes (Signed)
Peripherally Inserted Central Catheter/Midline Placement  The IV Nurse has discussed with the patient and/or persons authorized to consent for the patient, the purpose of this procedure and the potential benefits and risks involved with this procedure.  The benefits include less needle sticks, lab draws from the catheter and patient may be discharged home with the catheter.  Risks include, but not limited to, infection, bleeding, blood clot (thrombus formation), and puncture of an artery; nerve damage and irregular heat beat.  Alternatives to this procedure were also discussed.  PICC/Midline Placement Documentation        Emily Moran 03/17/2015, 3:04 PM

## 2015-03-17 NOTE — Progress Notes (Signed)
Utilization Review completed. Trei Schoch RN BSN CM 

## 2015-03-18 DIAGNOSIS — B182 Chronic viral hepatitis C: Secondary | ICD-10-CM

## 2015-03-18 LAB — GLUCOSE, CAPILLARY
GLUCOSE-CAPILLARY: 118 mg/dL — AB (ref 65–99)
GLUCOSE-CAPILLARY: 145 mg/dL — AB (ref 65–99)
Glucose-Capillary: 89 mg/dL (ref 65–99)
Glucose-Capillary: 129 mg/dL — ABNORMAL HIGH (ref 65–99)

## 2015-03-18 MED ORDER — BENZONATATE 100 MG PO CAPS
100.0000 mg | ORAL_CAPSULE | Freq: Three times a day (TID) | ORAL | Status: DC | PRN
  Administered 2015-03-18: 100 mg via ORAL
  Filled 2015-03-18 (×3): qty 1

## 2015-03-18 MED ORDER — LEVOFLOXACIN 750 MG PO TABS
750.0000 mg | ORAL_TABLET | Freq: Every day | ORAL | Status: DC
  Administered 2015-03-19: 750 mg via ORAL
  Filled 2015-03-18: qty 1

## 2015-03-18 MED ORDER — PREDNISONE 10 MG PO TABS
60.0000 mg | ORAL_TABLET | Freq: Every day | ORAL | Status: DC
  Administered 2015-03-19: 60 mg via ORAL
  Filled 2015-03-18 (×2): qty 1

## 2015-03-18 NOTE — Progress Notes (Signed)
PROGRESS NOTE  Emily Moran HWK:[id number] DOB: 12-09-1955 DOA: 03/16/2015 PCP: Philis Fendt, MD   Brief History 59 year old female with past medical history of COPD on home oxygen (3L at night), current smoker, hypertension, history of substance abuse (marijuana, cocaine in past). Patient presented to [facility] with worsening shortness of breath over 1 week prior to this admission. Shortness of breath is worse with exertion but is present at rest as well. Patient reported associated chronic cough with whitish sputum, subjective fevers and chills.  Assessment/Plan:  Acute respiratory failure with hypoxia / COPD with acute exacerbation - Patient presented with hypoxia, oxygen saturation 79% on room air. It has improved to 92% with nasal cannula oxygen support. - Chest x-ray on the admission showed severe right upper lobe bullous disease - Continue nebulizer treatment, duoneb every 6 hours as scheduled and every 2 hours as needed for shortness of breath or wheezing. - Discontinue intravenous solu-Medrol -Start prednisone taper - Patient was placed on empiric Levaquin-->change to po -Patient is normally on 3 L nasal cannula at nighttime, but suspect she needs it 24/7 -respiratory viral panel-->+rhinovirus Active Problems:  TOBACCO ABUSE - Nicotine patch ordered -pt has 30 pk year history -pt using E-cigarette presently   Substance abuse - Previous UDS remarkable for marijuana, cocaine.  -Check UDS on this admission--positive THC, Benzo - Continue methadone--pt is on methadone from [facility] which has been verified   Accelerated hypertension - Systolic blood pressure in 180s on the admission.  -start Norvasc 5 mg daily.  - Added hydralazine for better blood pressure control if blood pressure stays 140/90 after receiving Norvasc.   Hyperglycemia  - Likely steroid-induced  - Continue sliding scale insulin  - Check A1c--5.3  Chronic  hepatitis C without coma -Compensated at this time- -Has never been treated -Outpatient follow-up will be needed  DVT prophylaxis:  - SCD's bilaterally    Family Communication: Pt at beside Disposition Plan: Home when medically stable   Procedures/Studies: Dg Chest 2 View  03/16/2015   CLINICAL DATA:  Cough, shortness of breath, smoker for 45 years, 1 pack per day  EXAM: CHEST  2 VIEW  COMPARISON:  CT chest 05/07/2014  FINDINGS: The lungs are hyperinflated likely secondary to COPD. There is severe right upper lobe bullous disease. There is no focal parenchymal opacity. There is no pleural effusion or pneumothorax. The heart and mediastinal contours are unremarkable.  The osseous structures are unremarkable.  IMPRESSION: No active cardiopulmonary disease.   Electronically Signed   By: Kathreen Devoid   On: 03/16/2015 08:05         Subjective: Patient states that she is breathing better but still has some occasional dyspnea on exertion. Denies any fevers, chills, chest pain, short of breath, nausea, vomiting, diarrhea, abdominal pain. No dysuria.  Objective: Filed Vitals:   03/17/15 2152 03/18/15 0504 03/18/15 1026 03/18/15 1330  BP: 116/72 133/86 140/85 126/75  Pulse: 101 99 99 103  Temp: 98.4 F (36.9 C) 98.9 F (37.2 C)  98.3 F (36.8 C)  TempSrc: Oral Oral  Oral  Resp: '15 15  18  '$ Height:      Weight:      SpO2: 91% 93%  90%    Intake/Output Summary (Last 24 hours) at 03/18/15 1649 Last data filed at 03/18/15 0505  Gross per 24 hour  Intake    682 ml  Output   1850 ml  Net  -1168  ml   Weight change:  Exam:   General:  Pt is alert, follows commands appropriately, not in acute distress  HEENT: No icterus, No thrush, No neck mass, [place]/AT  Cardiovascular: RRR, S1/S2, no rubs, no gallops  Respiratory: Bilateral scattered rales without wheezing  Abdomen: Soft/+BS, non tender, non distended, no guarding  Extremities: No edema, No lymphangitis, No petechiae, No  rashes, no synovitis  Data Reviewed: Basic Metabolic Panel:  Recent Labs Lab 03/16/15 0726  NA 135  K 3.9  CL 101  CO2 24  GLUCOSE 205*  BUN <5*  CREATININE 0.82  CALCIUM 9.1   Liver Function Tests: No results for input(s): AST, ALT, ALKPHOS, BILITOT, PROT, ALBUMIN in the last 168 hours. No results for input(s): LIPASE, AMYLASE in the last 168 hours. No results for input(s): AMMONIA in the last 168 hours. CBC:  Recent Labs Lab 03/16/15 0726  WBC 10.6*  NEUTROABS 8.3*  HGB 15.0  HCT 45.4  MCV 93.8  PLT 185   Cardiac Enzymes: No results for input(s): CKTOTAL, CKMB, CKMBINDEX, TROPONINI in the last 168 hours. BNP: Invalid input(s): POCBNP CBG:  Recent Labs Lab 03/17/15 1207 03/17/15 1732 03/17/15 2121 03/18/15 0815 03/18/15 1224  GLUCAP 81 167* 123* 118* 89    Recent Results (from the past 240 hour(s))  Respiratory virus panel     Status: Abnormal   Collection Time: 03/16/15  6:47 PM  Result Value Ref Range Status   Respiratory Syncytial Virus A Negative Negative Final   Respiratory Syncytial Virus B Negative Negative Final   Influenza A Negative Negative Final   Influenza B Negative Negative Final   Parainfluenza 1 Negative Negative Final   Parainfluenza 2 Negative Negative Final   Parainfluenza 3 Negative Negative Final   Metapneumovirus Negative Negative Final   Rhinovirus Positive (A) Negative Final   Adenovirus Negative Negative Final    Comment: (NOTE) Performed At: [facility] [address] [place], [place] [id number] Lindon Romp MD WC:[id number]      Scheduled Meds: . amLODipine  5 mg Oral Daily  . antiseptic oral rinse  7 mL Mouth Rinse BID  . docusate sodium  100 mg Oral BID  . DULoxetine  60 mg Oral BID  . enoxaparin (LOVENOX) injection  40 mg Subcutaneous Q24H  . feeding supplement (ENSURE ENLIVE)  237 mL Oral BID BM  . folic acid  1 mg Oral Daily  . gabapentin  800 mg Oral TID  . insulin aspart  0-5 Units  Subcutaneous QHS  . insulin aspart  0-9 Units Subcutaneous TID WC  . ipratropium-albuterol  3 mL Nebulization QID  . levofloxacin (LEVAQUIN) IV  750 mg Intravenous Q24H  . methadone  110 mg Oral Daily  . methadone  5 mg Oral Daily  . methylPREDNISolone (SOLU-MEDROL) injection  60 mg Intravenous 3 times per day  . nicotine  21 mg Transdermal Daily  . pantoprazole  40 mg Oral Daily  . thiamine  100 mg Oral Daily   Continuous Infusions: . sodium chloride 10 mL/hr at 03/16/15 1223     Abrian Hanover, DO  [facility] Pager [phone]  If 7PM-7AM, please contact night-coverage [email or web address] Password [email or web address] 03/18/2015, 4:49 PM   LOS: 2 days

## 2015-03-18 NOTE — Progress Notes (Signed)
Educated pt. On Rhinovirus, Living wills and POA. Gave pt. And pt. Sister handouts about educated both on the differences and why they are important. Pt. And family were very appreciative of handouts. No further needs noted at this time

## 2015-03-19 DIAGNOSIS — G894 Chronic pain syndrome: Secondary | ICD-10-CM

## 2015-03-19 LAB — GLUCOSE, CAPILLARY: GLUCOSE-CAPILLARY: 72 mg/dL (ref 65–99)

## 2015-03-19 MED ORDER — LEVOFLOXACIN 750 MG PO TABS: 750.0000 mg | ORAL_TABLET | Freq: Every day | ORAL | Status: DC

## 2015-03-19 MED ORDER — PREDNISONE 10 MG PO TABS: 60.0000 mg | ORAL_TABLET | Freq: Every day | ORAL | Status: DC

## 2015-03-19 MED ORDER — BENZONATATE 100 MG PO CAPS: 100.0000 mg | ORAL_CAPSULE | Freq: Three times a day (TID) | ORAL | Status: DC | PRN

## 2015-03-19 MED ORDER — AMLODIPINE BESYLATE 5 MG PO TABS: 5.0000 mg | ORAL_TABLET | Freq: Every day | ORAL | Status: DC

## 2015-03-19 NOTE — Discharge Summary (Addendum)
Physician Discharge Summary  Emily Moran PNT:[id number] DOB: 05/06/1956 DOA: 03/16/2015  PCP: Philis Fendt, MD  Admit date: 03/16/2015 Discharge date: 03/19/2015  Recommendations for Outpatient Follow-up:  1. Pt will need to follow up with PCP in 1 week post discharge 2. Please obtain BMP and CBC in one week  Discharge Diagnoses:  Acute respiratory failure with hypoxia / COPD with acute exacerbation - Patient presented with hypoxia, oxygen saturation 79% on room air. It has improved to 92% with nasal cannula oxygen support. - Chest x-ray on the admission showed severe right upper lobe bullous disease - Continue nebulizer treatment, duoneb every 6 hours as scheduled and every 2 hours as needed for shortness of breath or wheezing. -The patient was started on levofloxacin and intravenous Solu-Medrol  - Discontinue intravenous solu-Medrol -Start prednisone taper 60 mg daily, decrease by 10 mg per day - Patient was placed on empiric Levaquin-->change to po -Although the patient initially claimed that she was on night time oxygen at home, it was later revealed by the patient that she has not been on any home oxygen for nearly 6 years. Ambulatory pulse oximetry was performed prior to discharge and the patient did not have desaturation <89% on room air. -respiratory viral panel-->+rhinovirus -Patient will continue on Advair and Spiriva after discharge  Active Problems:  TOBACCO ABUSE - Nicotine patch ordered -pt has 30 pk year history -pt using E-cigarette presently   Substance abuse - Previous UDS remarkable for marijuana, cocaine.  -Check UDS on this admission--positive THC, Benzo - Continue methadone--pt is on methadone from [facility] which has been verified -[facility], the patient is able to [facility]. She received one final dose of methadone during the inpatient hospitalization  on 03/19/2015.   Accelerated hypertension - Systolic blood pressure in 180s on the admission.  -start Norvasc 5 mg daily  with good control of her blood pressure    Hyperglycemia  - Likely steroid-induced  - Continue sliding scale insulin  - Check A1c--5.3  Chronic hepatitis C without coma -Compensated at this time- -Has never been treated -Outpatient follow-up will be needed  DVT prophylaxis:  - SCD's bilaterally    Discharge Condition: stable  Disposition:  Follow-up Information    Follow up with AVBUERE,EDWIN A, MD In 1 week.   Specialty:  Internal Medicine   Contact information:   [address] [place] [address] [phone]     home  Diet:regular Wt Readings from Last 3 Encounters:  03/19/15 57.4 kg (126 lb 8.7 oz)  02/15/15 56.7 kg (125 lb)  09/14/14 53.524 kg (118 lb)    History of present illness:  57.59 year old female with past medical history of COPD on home oxygen (3L at night), current smoker, hypertension, history of substance abuse (marijuana, cocaine in past). Patient presented to [facility] with worsening shortness of breath over 1 week prior to this admission. Shortness of breath is worse with exertion but is present at rest as well. Patient reported associated chronic cough with whitish sputum, subjective fevers and chills.   Respiratory viral panel showed rhinovirus. The patient was started on intravenous Solu-Medrol, aerosolized albuterol and Atrovent, and levofloxacin. The patient showed good clinical response. Her Solu-Medrol was tapered to prednisone. Levofloxacin was changed to oral formulation. The patient improved clinically and remained stable.     Discharge Exam: Filed Vitals:   03/19/15 0614  BP: 152/96  Pulse: 87  Temp: 98.5 F (36.9  C)  Resp: 20   Filed Vitals:   03/18/15 2235 03/19/15 0608 03/19/15 0614 03/19/15 0726  BP: 126/92  152/96   Pulse: 99 80 87   Temp: 98.1 F (36.7 C)  98.5 F (36.9 C)     TempSrc: Oral  Oral   Resp: 22  20   Height:      Weight:    57.4 kg (126 lb 8.7 oz)  SpO2: 94% 96% 92%    General: A&O x 3, NAD, pleasant, cooperative Cardiovascular: RRR, no rub, no gallop, no S3 Respiratory: bibasilar rales without any wheezing  Abdomen:soft, nontender, nondistended, positive bowel sounds Extremities: No edema, No lymphangitis, no petechiae  Discharge Instructions      Discharge Instructions    Diet - low sodium heart healthy    Complete by:  As directed      Increase activity slowly    Complete by:  As directed             Medication List    STOP taking these medications        nicotine polacrilex 2 MG gum  Commonly known as:  EQ NICOTINE     orphenadrine 100 MG tablet  Commonly known as:  NORFLEX      TAKE these medications        albuterol 108 (90 BASE) MCG/ACT inhaler  Commonly known as:  PROVENTIL HFA;VENTOLIN HFA  Inhale 2 puffs into the lungs every 6 (six) hours as needed for wheezing or shortness of breath.     amLODipine 5 MG tablet  Commonly known as:  NORVASC  Take 1 tablet (5 mg total) by mouth daily.     benzonatate 100 MG capsule  Commonly known as:  TESSALON  Take 1 capsule (100 mg total) by mouth 3 (three) times daily as needed for cough.     DULoxetine 60 MG capsule  Commonly known as:  CYMBALTA  Take 60 mg by mouth 2 (two) times daily.     Fluticasone-Salmeterol 250-50 MCG/DOSE Aepb  Commonly known as:  ADVAIR DISKUS  Inhale 1 puff into the lungs 2 (two) times daily.     gabapentin 800 MG tablet  Commonly known as:  NEURONTIN  Take 800 mg by mouth 3 (three) times daily.     levofloxacin 750 MG tablet  Commonly known as:  LEVAQUIN  Take 1 tablet (750 mg total) by mouth daily.  Start taking on:  03/20/2015     predniSONE 10 MG tablet  Commonly known as:  DELTASONE  Take 6 tablets (60 mg total) by mouth daily with breakfast. Start 03/20/15 and decrease by one tab daily  Start taking on:  03/20/2015      tiotropium 18 MCG inhalation capsule  Commonly known as:  SPIRIVA HANDIHALER  Place 1 capsule (18 mcg total) into inhaler and inhale daily.     tiZANidine 4 MG tablet  Commonly known as:  ZANAFLEX  Take 4 mg by mouth 2 (two) times daily.         The results of significant diagnostics from this hospitalization (including imaging, microbiology, ancillary and laboratory) are listed below for reference.    Significant Diagnostic Studies: Dg Chest 2 View  03/16/2015   CLINICAL DATA:  Cough, shortness of breath, smoker for 45 years, 1 pack per day  EXAM: CHEST  2 VIEW  COMPARISON:  CT chest 05/07/2014  FINDINGS: The lungs are hyperinflated likely secondary to COPD. There is severe right upper lobe bullous disease. There  is no focal parenchymal opacity. There is no pleural effusion or pneumothorax. The heart and mediastinal contours are unremarkable.  The osseous structures are unremarkable.  IMPRESSION: No active cardiopulmonary disease.   Electronically Signed   By: Kathreen Devoid   On: 03/16/2015 08:05     Microbiology: Recent Results (from the past 240 hour(s))  Respiratory virus panel     Status: Abnormal   Collection Time: 03/16/15  6:47 PM  Result Value Ref Range Status   Respiratory Syncytial Virus A Negative Negative Final   Respiratory Syncytial Virus B Negative Negative Final   Influenza A Negative Negative Final   Influenza B Negative Negative Final   Parainfluenza 1 Negative Negative Final   Parainfluenza 2 Negative Negative Final   Parainfluenza 3 Negative Negative Final   Metapneumovirus Negative Negative Final   Rhinovirus Positive (A) Negative Final   Adenovirus Negative Negative Final    Comment: (NOTE) Performed At: [facility] [place], [place] [id number] Lindon Romp MD AG:[id number]      Labs: Basic Metabolic Panel:  Recent Labs Lab 03/16/15 0726  NA 135  K 3.9  CL 101  CO2 24  GLUCOSE 205*  BUN <5*  CREATININE 0.82    CALCIUM 9.1   Liver Function Tests: No results for input(s): AST, ALT, ALKPHOS, BILITOT, PROT, ALBUMIN in the last 168 hours. No results for input(s): LIPASE, AMYLASE in the last 168 hours. No results for input(s): AMMONIA in the last 168 hours. CBC:  Recent Labs Lab 03/16/15 0726  WBC 10.6*  NEUTROABS 8.3*  HGB 15.0  HCT 45.4  MCV 93.8  PLT 185   Cardiac Enzymes: No results for input(s): CKTOTAL, CKMB, CKMBINDEX, TROPONINI in the last 168 hours. BNP: Invalid input(s): POCBNP CBG:  Recent Labs Lab 03/18/15 0815 03/18/15 1224 03/18/15 1745 03/18/15 2157 03/19/15 0754  GLUCAP 118* 89 145* 129* 72    Time coordinating discharge:  Greater than 30 minutes  Signed:  Brynnlie Unterreiner, DO [facility] Pager: [phone] 03/19/2015, 8:26 AM

## 2015-03-19 NOTE — Progress Notes (Signed)
SATURATION QUALIFICATIONS: (This note is used to comply with regulatory documentation for home oxygen)  Patient Saturations on Room Air at Rest = 96%  Patient Saturations on Room Air while Ambulating = 91%   Please briefly explain why patient needs home oxygen: does not qualify

## 2015-03-19 NOTE — Progress Notes (Signed)
Patient discharge teaching given, including activity, diet, follow-up appoints, and medications. Patient verbalized understanding of all discharge instructions. IV access was d/c'd. Vitals are stable. Skin is intact except as charted in most recent assessments. Pt to be escorted out by NT, to be driven home by family. 

## 2015-07-27 ENCOUNTER — Emergency Department (HOSPITAL_COMMUNITY)
Admission: EM | Admit: 2015-07-27 | Discharge: 2015-07-27 | Discharge status: Against Medical Advice | Payer: Medicare Other | Attending: Emergency Medicine | Admitting: Emergency Medicine

## 2015-07-27 ENCOUNTER — Emergency Department (HOSPITAL_COMMUNITY): Payer: Medicare Other

## 2015-07-27 ENCOUNTER — Ambulatory Visit (HOSPITAL_COMMUNITY): Admission: RE | Admit: 2015-07-27 | Payer: Medicare Other | Source: Ambulatory Visit

## 2015-07-27 ENCOUNTER — Encounter (HOSPITAL_COMMUNITY): Payer: Self-pay | Admitting: Emergency Medicine

## 2015-07-27 DIAGNOSIS — R109 Unspecified abdominal pain: Secondary | ICD-10-CM | POA: Insufficient documentation

## 2015-07-27 DIAGNOSIS — Z720 Tobacco use: Secondary | ICD-10-CM | POA: Insufficient documentation

## 2015-07-27 DIAGNOSIS — R1110 Vomiting, unspecified: Secondary | ICD-10-CM | POA: Diagnosis not present

## 2015-07-27 DIAGNOSIS — G8929 Other chronic pain: Secondary | ICD-10-CM | POA: Diagnosis not present

## 2015-07-27 DIAGNOSIS — R0602 Shortness of breath: Secondary | ICD-10-CM | POA: Diagnosis present

## 2015-07-27 DIAGNOSIS — J441 Chronic obstructive pulmonary disease with (acute) exacerbation: Secondary | ICD-10-CM | POA: Diagnosis not present

## 2015-07-27 DIAGNOSIS — I10 Essential (primary) hypertension: Secondary | ICD-10-CM | POA: Diagnosis not present

## 2015-07-27 LAB — CBC
HCT: 54.4 % — ABNORMAL HIGH (ref 36.0–46.0)
Hemoglobin: 18.6 g/dL — ABNORMAL HIGH (ref 12.0–15.0)
MCH: 31.6 pg (ref 26.0–34.0)
MCHC: 34.2 g/dL (ref 30.0–36.0)
MCV: 92.5 fL (ref 78.0–100.0)
PLATELETS: 218 10*3/uL (ref 150–400)
RBC: 5.88 MIL/uL — ABNORMAL HIGH (ref 3.87–5.11)
RDW: 13.1 % (ref 11.5–15.5)
WBC: 16.4 10*3/uL — ABNORMAL HIGH (ref 4.0–10.5)

## 2015-07-27 NOTE — ED Notes (Signed)
Staff now has called twice for pt, rn checked bathroom, outside, no response.

## 2015-07-27 NOTE — ED Notes (Signed)
No answer when patient name called in the lobby

## 2015-07-27 NOTE — ED Notes (Addendum)
Pt from home, per EMS, N/V/D since 0300 this am. No active vomiting in route. She asked EMS for pain medication for her stomach pain, epigastric cramping. Roommates were sick with these signs and symptoms last week.  Pt presents with bad cough, productive, moist, rhonchi throughout all lung lobes, states she does feel SOB. Complaining of central abdominal pain, nausea, and vomiting throughout the day. Moaning, breathing 26 and restless in triage.

## 2015-08-17 ENCOUNTER — Emergency Department (HOSPITAL_COMMUNITY)
Admission: EM | Admit: 2015-08-17 | Discharge: 2015-08-17 | Disposition: A | Discharge status: Home / Self Care | Payer: Medicare Other | Attending: Emergency Medicine | Admitting: Emergency Medicine

## 2015-08-17 ENCOUNTER — Encounter (HOSPITAL_COMMUNITY): Payer: Self-pay | Admitting: Emergency Medicine

## 2015-08-17 ENCOUNTER — Emergency Department (HOSPITAL_COMMUNITY): Payer: Medicare Other

## 2015-08-17 DIAGNOSIS — M1990 Unspecified osteoarthritis, unspecified site: Secondary | ICD-10-CM | POA: Diagnosis not present

## 2015-08-17 DIAGNOSIS — G8929 Other chronic pain: Secondary | ICD-10-CM | POA: Diagnosis not present

## 2015-08-17 DIAGNOSIS — Z720 Tobacco use: Secondary | ICD-10-CM | POA: Insufficient documentation

## 2015-08-17 DIAGNOSIS — F329 Major depressive disorder, single episode, unspecified: Secondary | ICD-10-CM | POA: Diagnosis not present

## 2015-08-17 DIAGNOSIS — I10 Essential (primary) hypertension: Secondary | ICD-10-CM | POA: Diagnosis not present

## 2015-08-17 DIAGNOSIS — R0602 Shortness of breath: Secondary | ICD-10-CM | POA: Diagnosis present

## 2015-08-17 DIAGNOSIS — Z8701 Personal history of pneumonia (recurrent): Secondary | ICD-10-CM | POA: Diagnosis not present

## 2015-08-17 DIAGNOSIS — Z862 Personal history of diseases of the blood and blood-forming organs and certain disorders involving the immune mechanism: Secondary | ICD-10-CM | POA: Diagnosis not present

## 2015-08-17 DIAGNOSIS — F419 Anxiety disorder, unspecified: Secondary | ICD-10-CM | POA: Diagnosis not present

## 2015-08-17 DIAGNOSIS — R059 Cough, unspecified: Secondary | ICD-10-CM

## 2015-08-17 DIAGNOSIS — Z87442 Personal history of urinary calculi: Secondary | ICD-10-CM | POA: Insufficient documentation

## 2015-08-17 DIAGNOSIS — Z79899 Other long term (current) drug therapy: Secondary | ICD-10-CM | POA: Insufficient documentation

## 2015-08-17 DIAGNOSIS — J441 Chronic obstructive pulmonary disease with (acute) exacerbation: Secondary | ICD-10-CM | POA: Insufficient documentation

## 2015-08-17 DIAGNOSIS — R05 Cough: Secondary | ICD-10-CM

## 2015-08-17 LAB — HEPATIC FUNCTION PANEL
ALBUMIN: 3.5 g/dL (ref 3.5–5.0)
ALT: 31 U/L (ref 14–54)
AST: 49 U/L — AB (ref 15–41)
Alkaline Phosphatase: 137 U/L — ABNORMAL HIGH (ref 38–126)
BILIRUBIN TOTAL: 0.6 mg/dL (ref 0.3–1.2)
Bilirubin, Direct: 0.1 mg/dL (ref 0.1–0.5)
Indirect Bilirubin: 0.5 mg/dL (ref 0.3–0.9)
TOTAL PROTEIN: 6.9 g/dL (ref 6.5–8.1)

## 2015-08-17 LAB — CBC WITH DIFFERENTIAL/PLATELET
Basophils Absolute: 0.1 10*3/uL (ref 0.0–0.1)
Basophils Relative: 1 %
EOS ABS: 0.4 10*3/uL (ref 0.0–0.7)
EOS PCT: 5 %
HCT: 42.2 % (ref 36.0–46.0)
Hemoglobin: 14 g/dL (ref 12.0–15.0)
Lymphocytes Relative: 22 %
Lymphs Abs: 2.1 10*3/uL (ref 0.7–4.0)
MCH: 31.5 pg (ref 26.0–34.0)
MCHC: 33.2 g/dL (ref 30.0–36.0)
MCV: 95 fL (ref 78.0–100.0)
Monocytes Absolute: 0.8 10*3/uL (ref 0.1–1.0)
Monocytes Relative: 8 %
Neutro Abs: 6.1 10*3/uL (ref 1.7–7.7)
Neutrophils Relative %: 64 %
PLATELETS: 200 10*3/uL (ref 150–400)
RBC: 4.44 MIL/uL (ref 3.87–5.11)
RDW: 13.4 % (ref 11.5–15.5)
WBC: 9.4 10*3/uL (ref 4.0–10.5)

## 2015-08-17 LAB — I-STAT TROPONIN, ED: TROPONIN I, POC: 0 ng/mL (ref 0.00–0.08)

## 2015-08-17 LAB — BASIC METABOLIC PANEL
Anion gap: 9 (ref 5–15)
BUN: <5 mg/dL — ABNORMAL LOW (ref 6–20)
CHLORIDE: 107 mmol/L (ref 101–111)
CO2: 23 mmol/L (ref 22–32)
CREATININE: 0.9 mg/dL (ref 0.44–1.00)
Calcium: 9.4 mg/dL (ref 8.9–10.3)
Glucose, Bld: 93 mg/dL (ref 65–99)
POTASSIUM: 3.9 mmol/L (ref 3.5–5.1)
SODIUM: 139 mmol/L (ref 135–145)

## 2015-08-17 MED ORDER — IPRATROPIUM BROMIDE 0.02 % IN SOLN
0.5000 mg | Freq: Once | RESPIRATORY_TRACT | Status: AC
  Administered 2015-08-17: 0.5 mg via RESPIRATORY_TRACT
  Filled 2015-08-17: qty 2.5

## 2015-08-17 MED ORDER — METHYLPREDNISOLONE SODIUM SUCC 125 MG IJ SOLR
125.0000 mg | Freq: Once | INTRAMUSCULAR | Status: AC
  Administered 2015-08-17: 125 mg via INTRAVENOUS
  Filled 2015-08-17: qty 2

## 2015-08-17 MED ORDER — ALBUTEROL SULFATE (2.5 MG/3ML) 0.083% IN NEBU: 5.0000 mg | INHALATION_SOLUTION | Freq: Four times a day (QID) | RESPIRATORY_TRACT | Status: DC | PRN

## 2015-08-17 MED ORDER — PREDNISONE 20 MG PO TABS: 40.0000 mg | ORAL_TABLET | Freq: Every day | ORAL | Status: DC

## 2015-08-17 MED ORDER — NEBULIZER DEVI: Status: DC

## 2015-08-17 MED ORDER — ALBUTEROL SULFATE (2.5 MG/3ML) 0.083% IN NEBU
5.0000 mg | INHALATION_SOLUTION | Freq: Once | RESPIRATORY_TRACT | Status: AC
  Administered 2015-08-17: 5 mg via RESPIRATORY_TRACT
  Filled 2015-08-17: qty 6

## 2015-08-17 NOTE — ED Notes (Signed)
Patient reports she has had increasing shortness of breath, as well as headache, runny nose, tightness in chest, and weakness in legs x 1 & 1/2 weeks. Patient reports hx of COPD.

## 2015-08-17 NOTE — ED Notes (Signed)
Quincy Carnes, PA at bedside at this time.

## 2015-08-17 NOTE — Discharge Instructions (Signed)
Take the prescribed medication as directed.  Recommend albuterol treatments every 4-6 hours as needed for shortness of breath/wheezing. Follow-up with your primary care physician. I have also provided a referral to pulmonology since you expressed interesting in seeing them. Return to the ED for new or worsening symptoms.

## 2015-08-17 NOTE — ED Provider Notes (Signed)
CSN: [id number]     Arrival date & time 08/17/15  0620 History   First MD Initiated Contact with Patient 08/17/15 [phone]     Chief Complaint  Patient presents with  . Shortness of Breath     (Consider location/radiation/quality/duration/timing/severity/associated sxs/prior Treatment) The history is provided by the patient and medical records.    59 year old female with history of COPD, hypertension, chronic pain on methadone, anemia, arthritis, hepatitis C, anxiety, depression, presenting to the ED for shortness of breath. Patient states the past week and a half she has had increased shortness of breath from her baseline. She admits to a productive cough with thick, brown sputum. She denies any hemoptysis. States she does have some chest tightness and feels a "rattle" in her chest. She endorses low-grade fever at home around 100F.  she denies any known sick contacts. She did not receive a flu or pneumonia vaccine this year. Additionally patient complains of nasal congestion, rhinorrhea, headache, and generalized weakness. Patient does use home oxygen-- she has a central home concentrator, does not have portable tanks at this time but states she is supposed to be set up with them shortly.  States she did not feel the need to use her home oxygen last night. Patient denies any nausea, vomiting, or diarrhea. She does admit to history of hepatitis C, states she's been taking her medications as directed. She denies any current abdominal pain. Vital signs stable on arrival.  Past Medical History  Diagnosis Date  . COPD (chronic obstructive pulmonary disease) ([place])   . Spinal stenosis   . Hypertension   . On home O2     "~ 3L once/wk and prn" (03/16/2015)  . Tobacco use   . Chronic pain   . Pain management   . Kidney stone   . Family history of adverse reaction to anesthesia     "my mother had an allergic reaction when she had kidney removed back in the '60's or '70's""  . Pneumonia     "@ least  once/yr for the past 8 yrs" (03/16/2015)  . Chronic bronchitis ([place])     "I basically keep it" (03/16/2015)  . Anemia     "as a child"  . History of stomach ulcers   . Hepatitis C   . Headache     "monthly; need glasses; comes on when I'm stressed or get too tired" (03/16/2015)  . Degenerative disc disease   . DDD (degenerative disc disease), lumbar   . Arthritis     "shoulders; back" (03/16/2015)  . Chronic back pain   . Anxiety   . Depression    Past Surgical History  Procedure Laterality Date  . Anterior cervical decomp/discectomy fusion  2002  . Total abdominal hysterectomy  1998  . Incontinence surgery  1998  . Femur im nail Right 11/28/2012    Procedure: INTRAMEDULLARY (IM) RETROGRADE FEMORAL NAILING wants jackson table , c-arm and biomet ;  Surgeon: Mauri Pole, MD;  Location: [facility];  Service: Orthopedics;  Laterality: Right;  . Back surgery    . Fracture surgery     No family history on file. Social History  Substance Use Topics  . Smoking status: Current Every Day Smoker -- 0.25 packs/day for 36 years    Types: Cigarettes  . Smokeless tobacco: Never Used  . Alcohol Use: No     Comment: denies h/o alcohol abuse on 03/16/2015   OB History    No data available     Review  of Systems  Respiratory: Positive for cough and shortness of breath.   All other systems reviewed and are negative.     Allergies  Aspirin  Home Medications   Prior to Admission medications   Medication Sig Start Date End Date Taking? Authorizing Provider  albuterol (PROVENTIL HFA;VENTOLIN HFA) 108 (90 BASE) MCG/ACT inhaler Inhale 2 puffs into the lungs every 6 (six) hours as needed for wheezing or shortness of breath.     Historical Provider, MD  amLODipine (NORVASC) 5 MG tablet Take 1 tablet (5 mg total) by mouth daily. 03/19/15   Orson Eva, MD  benzonatate (TESSALON) 100 MG capsule Take 1 capsule (100 mg total) by mouth 3 (three) times daily as needed for cough. 03/19/15   Orson Eva, MD   DULoxetine (CYMBALTA) 60 MG capsule Take 60 mg by mouth 2 (two) times daily.    Historical Provider, MD  Fluticasone-Salmeterol (ADVAIR DISKUS) 250-50 MCG/DOSE AEPB Inhale 1 puff into the lungs 2 (two) times daily. 05/07/14   Kathie Dike, MD  gabapentin (NEURONTIN) 800 MG tablet Take 800 mg by mouth 3 (three) times daily.    Historical Provider, MD  levofloxacin (LEVAQUIN) 750 MG tablet Take 1 tablet (750 mg total) by mouth daily. 03/20/15   Orson Eva, MD  predniSONE (DELTASONE) 10 MG tablet Take 6 tablets (60 mg total) by mouth daily with breakfast. Start 03/20/15 and decrease by one tab daily 03/20/15   Orson Eva, MD  tiotropium (SPIRIVA HANDIHALER) 18 MCG inhalation capsule Place 1 capsule (18 mcg total) into inhaler and inhale daily. 05/07/14   Kathie Dike, MD  tiZANidine (ZANAFLEX) 4 MG tablet Take 4 mg by mouth 2 (two) times daily.    Historical Provider, MD   BP 160/97 mmHg  Pulse 72  Temp(Src) 98.7 F (37.1 C) (Oral)  Resp 16  Ht '5\' 7"'$  (1.702 m)  Wt 125 lb (56.7 kg)  BMI 19.57 kg/m2  SpO2 96%   Physical Exam  Constitutional: She is oriented to person, place, and time. She appears well-developed and well-nourished. No distress.  HENT:  Head: Normocephalic and atraumatic.  Mouth/Throat: Oropharynx is clear and moist.  Eyes: Conjunctivae and EOM are normal. Pupils are equal, round, and reactive to light.  Neck: Normal range of motion. Neck supple.  Cardiovascular: Normal rate, regular rhythm and normal heart sounds.   Pulmonary/Chest: Effort normal. No respiratory distress. She has wheezes.  No distress, speaking in full sentences without difficulty, coarse breath sounds bilaterally with wheezes noted  Abdominal: Soft. Bowel sounds are normal. There is no tenderness. There is no guarding.  Musculoskeletal: Normal range of motion.  Neurological: She is alert and oriented to person, place, and time.  Skin: Skin is warm and dry. She is not diaphoretic.  Psychiatric: She has a  normal mood and affect.  Nursing note and vitals reviewed.   ED Course  Procedures (including critical care time) Labs Review Labs Reviewed  BASIC METABOLIC PANEL - Abnormal; Notable for the following:    BUN <5 (*)    All other components within normal limits  HEPATIC FUNCTION PANEL - Abnormal; Notable for the following:    AST 49 (*)    Alkaline Phosphatase 137 (*)    All other components within normal limits  CBC WITH DIFFERENTIAL/PLATELET  Randolm Idol, ED    Imaging Review Dg Chest 2 View  08/17/2015  CLINICAL DATA:  Cough and fever for 1 day. EXAM: CHEST  2 VIEW COMPARISON:  03/16/2015 FINDINGS: Hyperinflation. Lower cervical spine  fixation. Midline trachea. Normal heart size and mediastinal contours. No pleural fluid. Emphysema, including bullous disease in the right upper lobe and apex. Right apical scarring. No left and no convincing evidence of right-sided pneumothorax. Lower lobe predominant interstitial thickening. EKG button artifacts bilaterally. No lobar consolidation. IMPRESSION: No acute cardiopulmonary disease. Bullous type emphysema with chronic interstitial thickening. Electronically Signed   By: Abigail Miyamoto M.D.   On: 08/17/2015 07:27   I have personally reviewed and evaluated these images and lab results as part of my medical decision-making.   EKG Interpretation None      MDM   Final diagnoses:  Shortness of breath  Cough   58 year old female here with shortness of breath. She has history of end-stage COPD and has home oxygen PRN, however she has not used this in the past several days.  She also reports a cough with thick, brown sputum. Patient is afebrile, nontoxic. She is in no acute respiratory distress. She has coarse breath sounds bilaterally with wheezes noted. Remainder of exam is benign.  EKG largely unchanged from prior. Labwork is reassuring, troponin negative. Chest x-ray with chronic findings, no acute infiltrate to suggest pneumonia.  Patient will be treated with Solu-Medrol, albuterol/Atrovent nebulizer. Will reassess.  8:31 AM On re-check patient states she is feeling better after neb treatments and solu-medrol.  He O2 sats are 94% on RA without any supplemental oxygen.  She states her chest tightness has vastly improved.  Remainder of VS remain stable.  Patient states she is feeling better and would like to go home. Given that her vital signs have remained overall stable on room air and work-up is negative, I feel this is reasonable.  Sx most likely due to COPD, lower suspicion for ACS, PE, dissection, or other acute cardiac event.  I've encouraged her to use her home oxygen as needed.  I have also written her for home nebulizer machine with albuterol solution to use every 4-6 hours as needed.  Rx prednisone taper as well.  Patient to follow-up with PCP.  I have also provided her with referral to pulmonology as she expressed interesting in seeing them for more adequate control of her COPD.  Discussed plan with patient, he/she acknowledged understanding and agreed with plan of care.  Return precautions given for new or worsening symptoms.  Larene Pickett, PA-C 08/17/15 3734  Charlesetta Shanks, MD 08/18/15 [phone]

## 2015-09-13 IMAGING — CR DG CHEST 2V
2 series · 2 of 2 positions shown · non-contrast
Comparison: CT chest 05/07/2014

CLINICAL DATA: Cough, shortness of breath, smoker for 45 years, 1
pack per day

EXAM:
CHEST  2 VIEW

[chest pa]
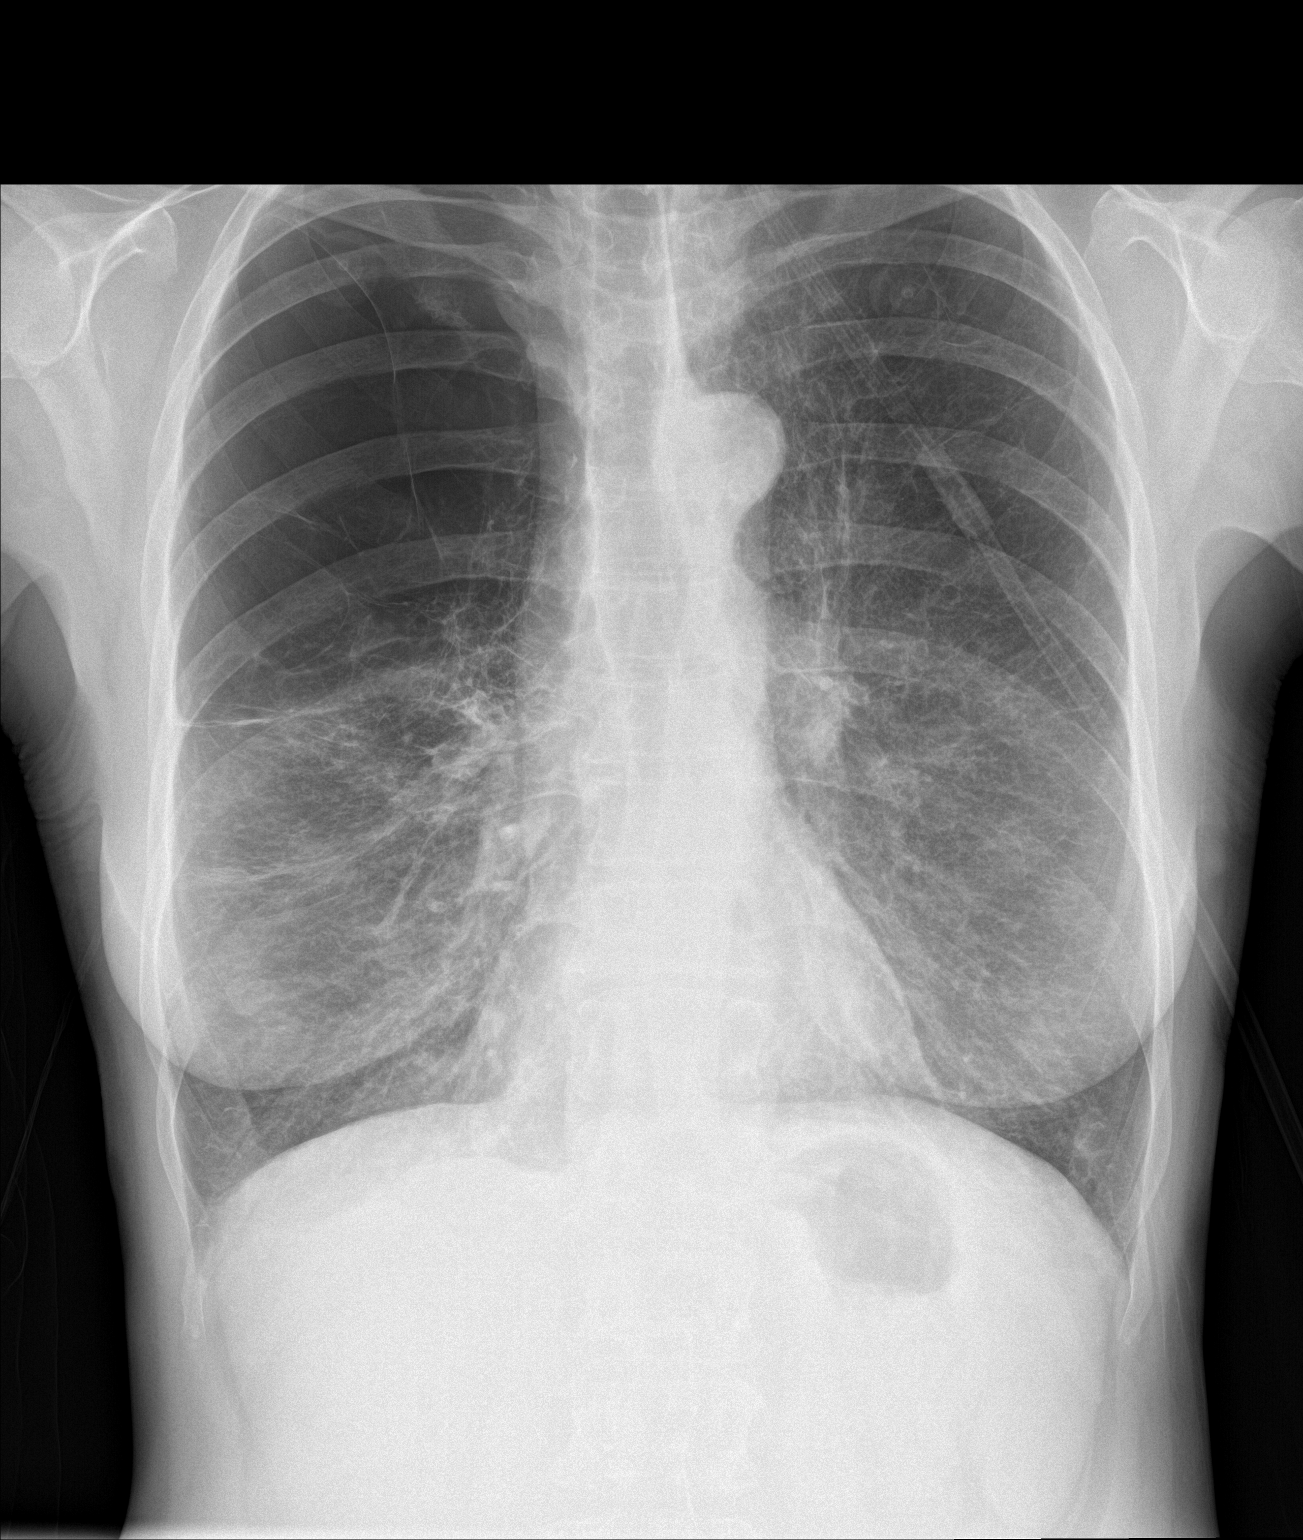

[chest lat]
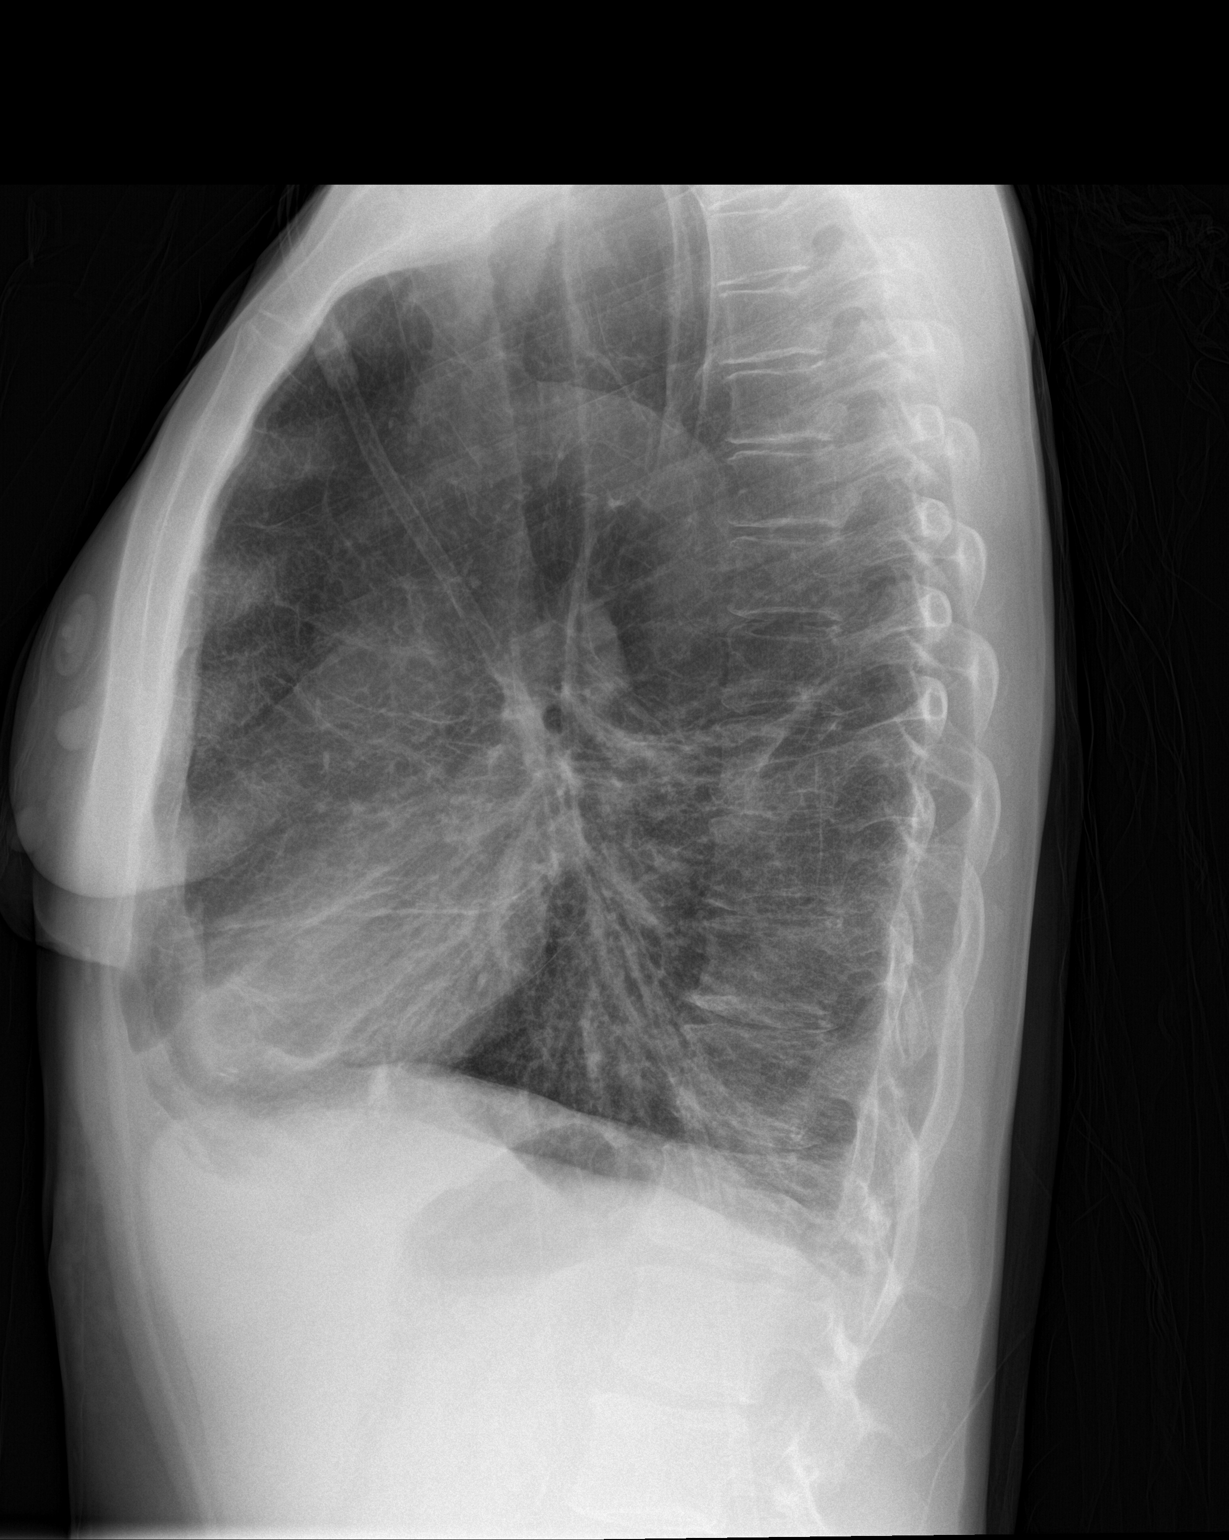

[2 of 2 positions shown; findings below may reference images not displayed]

FINDINGS: The lungs are hyperinflated likely secondary to COPD. There is
severe right upper lobe bullous disease. There is no focal
parenchymal opacity. There is no pleural effusion or pneumothorax.
The heart and mediastinal contours are unremarkable.

The osseous structures are unremarkable.
IMPRESSION: No active cardiopulmonary disease.

## 2016-01-17 ENCOUNTER — Other Ambulatory Visit (HOSPITAL_COMMUNITY): Payer: Self-pay | Admitting: Gastroenterology

## 2016-01-17 DIAGNOSIS — B1920 Unspecified viral hepatitis C without hepatic coma: Secondary | ICD-10-CM

## 2016-01-24 ENCOUNTER — Ambulatory Visit (HOSPITAL_COMMUNITY): Payer: Medicare Other

## 2016-02-15 ENCOUNTER — Ambulatory Visit (HOSPITAL_COMMUNITY)
Admission: RE | Admit: 2016-02-15 | Discharge: 2016-02-15 | Disposition: A | Discharge status: Home / Self Care | Payer: Medicare Other | Source: Ambulatory Visit | Attending: Gastroenterology | Admitting: Gastroenterology

## 2016-02-15 DIAGNOSIS — B1920 Unspecified viral hepatitis C without hepatic coma: Secondary | ICD-10-CM

## 2016-02-16 ENCOUNTER — Ambulatory Visit (HOSPITAL_COMMUNITY)
Admission: RE | Admit: 2016-02-16 | Discharge: 2016-02-16 | Disposition: A | Discharge status: Home / Self Care | Payer: Medicare Other | Source: Ambulatory Visit | Attending: Gastroenterology | Admitting: Gastroenterology

## 2016-02-16 DIAGNOSIS — N281 Cyst of kidney, acquired: Secondary | ICD-10-CM | POA: Diagnosis not present

## 2016-02-16 DIAGNOSIS — B1920 Unspecified viral hepatitis C without hepatic coma: Secondary | ICD-10-CM | POA: Insufficient documentation

## 2016-03-07 ENCOUNTER — Inpatient Hospital Stay (HOSPITAL_COMMUNITY)
Admission: EM | Admit: 2016-03-07 | Discharge: 2016-03-09 | DRG: 190 | Disposition: A | Discharge status: Home / Self Care | Payer: Medicare Other | Attending: Internal Medicine | Admitting: Internal Medicine

## 2016-03-07 ENCOUNTER — Encounter (HOSPITAL_COMMUNITY): Payer: Self-pay | Admitting: Emergency Medicine

## 2016-03-07 ENCOUNTER — Emergency Department (HOSPITAL_COMMUNITY): Payer: Medicare Other

## 2016-03-07 DIAGNOSIS — J449 Chronic obstructive pulmonary disease, unspecified: Secondary | ICD-10-CM | POA: Diagnosis present

## 2016-03-07 DIAGNOSIS — Z90710 Acquired absence of both cervix and uterus: Secondary | ICD-10-CM

## 2016-03-07 DIAGNOSIS — I10 Essential (primary) hypertension: Secondary | ICD-10-CM | POA: Diagnosis present

## 2016-03-07 DIAGNOSIS — F17210 Nicotine dependence, cigarettes, uncomplicated: Secondary | ICD-10-CM | POA: Diagnosis present

## 2016-03-07 DIAGNOSIS — Z79891 Long term (current) use of opiate analgesic: Secondary | ICD-10-CM | POA: Diagnosis not present

## 2016-03-07 DIAGNOSIS — R0602 Shortness of breath: Secondary | ICD-10-CM | POA: Diagnosis present

## 2016-03-07 DIAGNOSIS — Z8701 Personal history of pneumonia (recurrent): Secondary | ICD-10-CM | POA: Diagnosis not present

## 2016-03-07 DIAGNOSIS — Z8711 Personal history of peptic ulcer disease: Secondary | ICD-10-CM | POA: Diagnosis not present

## 2016-03-07 DIAGNOSIS — J189 Pneumonia, unspecified organism: Secondary | ICD-10-CM | POA: Diagnosis not present

## 2016-03-07 DIAGNOSIS — J441 Chronic obstructive pulmonary disease with (acute) exacerbation: Secondary | ICD-10-CM | POA: Diagnosis present

## 2016-03-07 DIAGNOSIS — J440 Chronic obstructive pulmonary disease with acute lower respiratory infection: Principal | ICD-10-CM | POA: Diagnosis present

## 2016-03-07 DIAGNOSIS — F17200 Nicotine dependence, unspecified, uncomplicated: Secondary | ICD-10-CM | POA: Diagnosis present

## 2016-03-07 DIAGNOSIS — J9601 Acute respiratory failure with hypoxia: Secondary | ICD-10-CM | POA: Diagnosis present

## 2016-03-07 DIAGNOSIS — Z9981 Dependence on supplemental oxygen: Secondary | ICD-10-CM

## 2016-03-07 DIAGNOSIS — Z981 Arthrodesis status: Secondary | ICD-10-CM

## 2016-03-07 DIAGNOSIS — R51 Headache: Secondary | ICD-10-CM | POA: Diagnosis present

## 2016-03-07 DIAGNOSIS — G8929 Other chronic pain: Secondary | ICD-10-CM | POA: Diagnosis present

## 2016-03-07 DIAGNOSIS — J9621 Acute and chronic respiratory failure with hypoxia: Secondary | ICD-10-CM | POA: Diagnosis present

## 2016-03-07 DIAGNOSIS — B1920 Unspecified viral hepatitis C without hepatic coma: Secondary | ICD-10-CM | POA: Diagnosis present

## 2016-03-07 DIAGNOSIS — T380X5A Adverse effect of glucocorticoids and synthetic analogues, initial encounter: Secondary | ICD-10-CM | POA: Diagnosis present

## 2016-03-07 DIAGNOSIS — F1910 Other psychoactive substance abuse, uncomplicated: Secondary | ICD-10-CM | POA: Diagnosis present

## 2016-03-07 LAB — URINALYSIS, ROUTINE W REFLEX MICROSCOPIC
BILIRUBIN URINE: NEGATIVE
GLUCOSE, UA: NEGATIVE mg/dL
Ketones, ur: NEGATIVE mg/dL
Leukocytes, UA: NEGATIVE
Nitrite: NEGATIVE
PROTEIN: NEGATIVE mg/dL
SPECIFIC GRAVITY, URINE: 1.004 — AB (ref 1.005–1.030)
pH: 6 (ref 5.0–8.0)

## 2016-03-07 LAB — BASIC METABOLIC PANEL
Anion gap: 9 (ref 5–15)
BUN: 11 mg/dL (ref 6–20)
CALCIUM: 8.8 mg/dL — AB (ref 8.9–10.3)
CO2: 24 mmol/L (ref 22–32)
Chloride: 102 mmol/L (ref 101–111)
Creatinine, Ser: 1.11 mg/dL — ABNORMAL HIGH (ref 0.44–1.00)
GFR calc Af Amer: >60 mL/min (ref >60)
GFR, EST NON AFRICAN AMERICAN: 54 mL/min — AB (ref >60)
GLUCOSE: 114 mg/dL — AB (ref 65–99)
Potassium: 3.5 mmol/L (ref 3.5–5.1)
Sodium: 135 mmol/L (ref 135–145)

## 2016-03-07 LAB — CBC
HCT: 38.3 % (ref 36.0–46.0)
Hemoglobin: 13.2 g/dL (ref 12.0–15.0)
MCH: 31.1 pg (ref 26.0–34.0)
MCHC: 34.5 g/dL (ref 30.0–36.0)
MCV: 90.3 fL (ref 78.0–100.0)
PLATELETS: 248 10*3/uL (ref 150–400)
RBC: 4.24 MIL/uL (ref 3.87–5.11)
RDW: 13.6 % (ref 11.5–15.5)
WBC: 15.7 10*3/uL — ABNORMAL HIGH (ref 4.0–10.5)

## 2016-03-07 LAB — I-STAT TROPONIN, ED: TROPONIN I, POC: 0.01 ng/mL (ref 0.00–0.08)

## 2016-03-07 LAB — DIFFERENTIAL

## 2016-03-07 LAB — URINE MICROSCOPIC-ADD ON: BACTERIA UA: NONE SEEN

## 2016-03-07 LAB — I-STAT CG4 LACTIC ACID, ED: LACTIC ACID, VENOUS: 1.22 mmol/L (ref 0.5–2.0)

## 2016-03-07 MED ORDER — MOMETASONE FURO-FORMOTEROL FUM 200-5 MCG/ACT IN AERO
2.0000 | INHALATION_SPRAY | Freq: Two times a day (BID) | RESPIRATORY_TRACT | Status: DC
  Administered 2016-03-07 – 2016-03-09 (×4): 2 via RESPIRATORY_TRACT
  Filled 2016-03-07: qty 8.8

## 2016-03-07 MED ORDER — HYDRALAZINE HCL 20 MG/ML IJ SOLN: 10.0000 mg | Freq: Four times a day (QID) | INTRAMUSCULAR | Status: DC | PRN

## 2016-03-07 MED ORDER — DEXTROSE 5 % IV SOLN
1.0000 g | Freq: Once | INTRAVENOUS | Status: AC
  Administered 2016-03-07: 1 g via INTRAVENOUS
  Filled 2016-03-07: qty 10

## 2016-03-07 MED ORDER — TIZANIDINE HCL 4 MG PO TABS
4.0000 mg | ORAL_TABLET | Freq: Two times a day (BID) | ORAL | Status: DC
  Administered 2016-03-07 – 2016-03-09 (×4): 4 mg via ORAL
  Filled 2016-03-07 (×5): qty 1

## 2016-03-07 MED ORDER — ONDANSETRON HCL 4 MG/2ML IJ SOLN
4.0000 mg | Freq: Once | INTRAMUSCULAR | Status: AC
  Administered 2016-03-07: 4 mg via INTRAVENOUS
  Filled 2016-03-07: qty 2

## 2016-03-07 MED ORDER — ONDANSETRON HCL 4 MG/2ML IJ SOLN: 4.0000 mg | Freq: Four times a day (QID) | INTRAMUSCULAR | Status: DC | PRN

## 2016-03-07 MED ORDER — ENOXAPARIN SODIUM 40 MG/0.4ML ~~LOC~~ SOLN
40.0000 mg | SUBCUTANEOUS | Status: DC
  Administered 2016-03-07 – 2016-03-08 (×2): 40 mg via SUBCUTANEOUS
  Filled 2016-03-07 (×2): qty 0.4

## 2016-03-07 MED ORDER — BENZONATATE 100 MG PO CAPS
100.0000 mg | ORAL_CAPSULE | Freq: Three times a day (TID) | ORAL | Status: DC | PRN
Start: 2016-03-07 — End: 2016-03-09

## 2016-03-07 MED ORDER — AZITHROMYCIN 250 MG PO TABS: 500.0000 mg | ORAL_TABLET | ORAL | Status: DC

## 2016-03-07 MED ORDER — SODIUM CHLORIDE 0.9 % IV SOLN: INTRAVENOUS | Status: AC

## 2016-03-07 MED ORDER — ACETAMINOPHEN 500 MG PO TABS
1000.0000 mg | ORAL_TABLET | Freq: Once | ORAL | Status: AC
  Administered 2016-03-07: 1000 mg via ORAL
  Filled 2016-03-07: qty 2

## 2016-03-07 MED ORDER — METHYLPREDNISOLONE SODIUM SUCC 125 MG IJ SOLR
125.0000 mg | Freq: Once | INTRAMUSCULAR | Status: AC
  Administered 2016-03-07: 125 mg via INTRAVENOUS
  Filled 2016-03-07: qty 2

## 2016-03-07 MED ORDER — AZITHROMYCIN 250 MG PO TABS
500.0000 mg | ORAL_TABLET | ORAL | Status: DC
  Administered 2016-03-08 – 2016-03-09 (×2): 500 mg via ORAL
  Filled 2016-03-07 (×2): qty 2

## 2016-03-07 MED ORDER — DEXTROSE 5 % IV SOLN
500.0000 mg | Freq: Once | INTRAVENOUS | Status: AC
  Administered 2016-03-07: 500 mg via INTRAVENOUS
  Filled 2016-03-07: qty 500

## 2016-03-07 MED ORDER — METHADONE HCL 5 MG PO TABS: 10.0000 mg | ORAL_TABLET | Freq: Every day | ORAL | Status: DC

## 2016-03-07 MED ORDER — GABAPENTIN 300 MG PO CAPS
600.0000 mg | ORAL_CAPSULE | Freq: Three times a day (TID) | ORAL | Status: DC
  Administered 2016-03-07 – 2016-03-09 (×5): 600 mg via ORAL
  Filled 2016-03-07 (×11): qty 2

## 2016-03-07 MED ORDER — IPRATROPIUM-ALBUTEROL 0.5-2.5 (3) MG/3ML IN SOLN: 3.0000 mL | Freq: Four times a day (QID) | RESPIRATORY_TRACT | Status: DC | PRN

## 2016-03-07 MED ORDER — DULOXETINE HCL 60 MG PO CPEP
60.0000 mg | ORAL_CAPSULE | Freq: Two times a day (BID) | ORAL | Status: DC
  Administered 2016-03-07 – 2016-03-09 (×4): 60 mg via ORAL
  Filled 2016-03-07 (×4): qty 1

## 2016-03-07 MED ORDER — IPRATROPIUM-ALBUTEROL 0.5-2.5 (3) MG/3ML IN SOLN
3.0000 mL | Freq: Three times a day (TID) | RESPIRATORY_TRACT | Status: DC
  Administered 2016-03-08 – 2016-03-09 (×4): 3 mL via RESPIRATORY_TRACT
  Filled 2016-03-07 (×4): qty 3

## 2016-03-07 MED ORDER — DEXTROSE 5 % IV SOLN: 1.0000 g | INTRAVENOUS | Status: DC

## 2016-03-07 MED ORDER — DEXTROSE 5 % IV SOLN: 500.0000 mg | INTRAVENOUS | Status: DC

## 2016-03-07 MED ORDER — SODIUM CHLORIDE 0.9 % IV BOLUS (SEPSIS)
1000.0000 mL | Freq: Once | INTRAVENOUS | Status: AC
  Administered 2016-03-07: 1000 mL via INTRAVENOUS

## 2016-03-07 MED ORDER — METHYLPREDNISOLONE SODIUM SUCC 125 MG IJ SOLR
60.0000 mg | Freq: Three times a day (TID) | INTRAMUSCULAR | Status: DC
  Administered 2016-03-08 – 2016-03-09 (×5): 60 mg via INTRAVENOUS
  Filled 2016-03-07 (×6): qty 2

## 2016-03-07 MED ORDER — ALBUTEROL SULFATE (2.5 MG/3ML) 0.083% IN NEBU
5.0000 mg | INHALATION_SOLUTION | RESPIRATORY_TRACT | Status: DC | PRN
Start: 2016-03-07 — End: 2016-03-09

## 2016-03-07 MED ORDER — ACETAMINOPHEN 325 MG PO TABS: 650.0000 mg | ORAL_TABLET | Freq: Four times a day (QID) | ORAL | Status: DC | PRN

## 2016-03-07 MED ORDER — DEXTROSE 5 % IV SOLN
1.0000 g | INTRAVENOUS | Status: DC
  Administered 2016-03-08: 1 g via INTRAVENOUS
  Filled 2016-03-07: qty 10

## 2016-03-07 MED ORDER — AMLODIPINE BESYLATE 5 MG PO TABS
5.0000 mg | ORAL_TABLET | Freq: Every day | ORAL | Status: DC
  Administered 2016-03-07 – 2016-03-09 (×3): 5 mg via ORAL
  Filled 2016-03-07 (×3): qty 1

## 2016-03-07 MED ORDER — ALBUTEROL SULFATE (2.5 MG/3ML) 0.083% IN NEBU
5.0000 mg | INHALATION_SOLUTION | Freq: Once | RESPIRATORY_TRACT | Status: AC
  Administered 2016-03-07: 5 mg via RESPIRATORY_TRACT
  Filled 2016-03-07: qty 6

## 2016-03-07 NOTE — ED Notes (Signed)
Patient presents SOB, HA, subjective fever, productive cough with yellow sputum x3-4 days.

## 2016-03-07 NOTE — Progress Notes (Signed)
Utilization Review completed.  Amaziah Raisanen RN CM  

## 2016-03-07 NOTE — Progress Notes (Signed)
Pharmacy Antibiotic Note  GERENE NEDD is a 60 y.o. female admitted on 03/07/2016 with SOB, sepsis, CAP.  Pharmacy has been consulted for Ceftriaxone and Azithromycin dosing.  Plan:  Ceftriaxone 1g IV q24h  Azithromycin '500mg'$  IV q24h  Dosage remains stable and need for further dosage adjustment appears unlikely at present.    Pharmacy will sign off at this time.  Please reconsult if a change in clinical status warrants re-evaluation of dosage.  Height: '5\' 6"'$  (167.6 cm) Weight: 130 lb (58.968 kg) IBW/kg (Calculated) : 59.3  Temp (24hrs), Avg:101.3 F (38.5 C), Min:101.3 F (38.5 C), Max:101.3 F (38.5 C)   Recent Labs Lab 03/07/16 1602  WBC 15.7*    CrCl cannot be calculated (Patient has no serum creatinine result on file.).    Allergies  Allergen Reactions  . Aspirin     Upset stomach    Thank you for allowing pharmacy to be a part of this patient's care.  Gretta Arab PharmD, BCPS Pager [phone] 03/07/2016 4:36 PM

## 2016-03-07 NOTE — ED Notes (Signed)
resp paged

## 2016-03-07 NOTE — ED Notes (Signed)
Per admitting Dr. Stop fluids. Call resp for flutter valve bed being change to stepdown.

## 2016-03-07 NOTE — ED Notes (Signed)
N/V with mucous producing cough. Order for zofran will be requested

## 2016-03-07 NOTE — H&P (Signed)
TRH H&P   Patient Demographics:    Emily Moran, is a 60 y.o. female  MRN: [id number]   DOB - 09-20-1956  Admit Date - 03/07/2016  Outpatient Primary MD for the patient is Philis Fendt, MD  Outpatient Specialists: None   Patient coming from: Home  Chief Complaint  Patient presents with  . Shortness of Breath  . Headache      HPI:    Emily Moran  is a 60 y.o. female, With history of COPD on 2-3 L nasal cannula oxygen at home, does not have a pulmonologist, chronic pain on methadone, ongoing smoking counseled to quit, kidney stones, essential hypertension, spinal stenosis, who comes to the hospital with 1 week history of productive cough and gradually progressive shortness of breath. She also had subjective fevers at home. Mild headaches from persistent coughing, no exposure to sick contacts, no recent travel, no chest pain, no other subjective complaints.  In the ER clinical diagnosis of community-acquired pneumonia with acute on chronic hypoxic respiratory failure and I was called to admit the patient. Chest x-ray surprisingly is clean, she did have fever along with leukocytosis and productive cough.    Review of systems:    In addition to the HPI above,   Positive fevers but no chills, No Headache, No changes with Vision or hearing, No problems swallowing food or Liquids, No Chest pain, Positive productive cough and shortness of breath   No Abdominal pain, No Nausea or Vommitting, Bowel movements are regular, No Blood in stool or Urine, No dysuria, No new skin rashes or bruises, No new joints pains-aches,  No new weakness, tingling, numbness in any extremity, No recent weight gain or loss, No  polyuria, polydypsia or polyphagia, No significant Mental Stressors.  A full 10 point Review of Systems was done, except as stated above, all other Review of Systems were negative.   With Past History of the following :    Past Medical History  Diagnosis Date  . COPD (chronic obstructive pulmonary disease) ([place])   . Spinal stenosis   . Hypertension   . On home O2     "~ 3L once/wk and prn" (03/16/2015)  . Tobacco use   . Chronic pain   . Pain management   . Kidney stone   . Family history of adverse reaction to  anesthesia     "my mother had an allergic reaction when she had kidney removed back in the '60's or '70's""  . Pneumonia     "@ least once/yr for the past 8 yrs" (03/16/2015)  . Chronic bronchitis ([place])     "I basically keep it" (03/16/2015)  . Anemia     "as a child"  . History of stomach ulcers   . Hepatitis C   . Headache     "monthly; need glasses; comes on when I'm stressed or get too tired" (03/16/2015)  . Degenerative disc disease   . DDD (degenerative disc disease), lumbar   . Arthritis     "shoulders; back" (03/16/2015)  . Chronic back pain   . Anxiety   . Depression       Past Surgical History  Procedure Laterality Date  . Anterior cervical decomp/discectomy fusion  2002  . Total abdominal hysterectomy  1998  . Incontinence surgery  1998  . Femur im nail Right 11/28/2012    Procedure: INTRAMEDULLARY (IM) RETROGRADE FEMORAL NAILING wants jackson table , c-arm and biomet ;  Surgeon: Mauri Pole, MD;  Location: [facility];  Service: Orthopedics;  Laterality: Right;  . Back surgery    . Fracture surgery        Social History:     Social History  Substance Use Topics  . Smoking status: Current Every Day Smoker -- 0.25 packs/day for 36 years    Types: Cigarettes  . Smokeless tobacco: Never Used  . Alcohol Use: No     Comment: denies h/o alcohol abuse on 03/16/2015     Lives - At home and fairly mobile      Family History :   Family history negative  for COPD   Home Medications:   Prior to Admission medications   Medication Sig Start Date End Date Taking? Authorizing Provider  albuterol (PROVENTIL) (2.5 MG/3ML) 0.083% nebulizer solution Take 6 mLs (5 mg total) by nebulization every 6 (six) hours as needed for wheezing or shortness of breath. 08/17/15  Yes Larene Pickett, PA-C  DULoxetine (CYMBALTA) 60 MG capsule Take 60 mg by mouth 2 (two) times daily.   Yes Historical Provider, MD  gabapentin (NEURONTIN) 800 MG tablet Take 800 mg by mouth 3 (three) times daily.   Yes Historical Provider, MD  methadone (DOLOPHINE) 10 MG/ML solution Take 20 mg by mouth daily.   Yes Historical Provider, MD  tiotropium (SPIRIVA HANDIHALER) 18 MCG inhalation capsule Place 1 capsule (18 mcg total) into inhaler and inhale daily. 05/07/14  Yes Kathie Dike, MD  tiZANidine (ZANAFLEX) 4 MG tablet Take 4 mg by mouth 2 (two) times daily.   Yes Historical Provider, MD  amLODipine (NORVASC) 5 MG tablet Take 1 tablet (5 mg total) by mouth daily. Patient not taking: Reported on 03/07/2016 03/19/15   Orson Eva, MD  benzonatate (TESSALON) 100 MG capsule Take 1 capsule (100 mg total) by mouth 3 (three) times daily as needed for cough. Patient not taking: Reported on 08/17/2015 03/19/15   Orson Eva, MD  Fluticasone-Salmeterol (ADVAIR DISKUS) 250-50 MCG/DOSE AEPB Inhale 1 puff into the lungs 2 (two) times daily. Patient not taking: Reported on 03/07/2016 05/07/14   Kathie Dike, MD  levofloxacin (LEVAQUIN) 750 MG tablet Take 1 tablet (750 mg total) by mouth daily. Patient not taking: Reported on 08/17/2015 03/20/15   Orson Eva, MD  predniSONE (DELTASONE) 20 MG tablet Take 2 tablets (40 mg total) by mouth daily. Take 40 mg by  mouth daily for 3 days, then '20mg'$  by mouth daily for 3 days, then '10mg'$  daily for 3 days Patient not taking: Reported on 03/07/2016 08/17/15   Larene Pickett, PA-C  Respiratory Therapy Supplies (NEBULIZER) DEVI Nebulizer for home use. 08/17/15   Larene Pickett, PA-C      Allergies:     Allergies  Allergen Reactions  . Aspirin     Upset stomach     Physical Exam:   Vitals  Blood pressure 150/95, pulse 95, temperature 100.3 F (37.9 C), temperature source Oral, resp. rate 13, height '5\' 6"'$  (1.676 m), weight 58.968 kg (130 lb), SpO2 90 %.   1. General Frail middle-aged white female sitting in hospital bed in mild shortness of breath,  2. Normal affect and insight, Not Suicidal or Homicidal, Awake Alert, Oriented X 3.  3. No F.N deficits, ALL C.Nerves Intact, Strength 5/5 all 4 extremities, Sensation intact all 4 extremities, Plantars down going.  4. Ears and Eyes appear Normal, Conjunctivae clear, PERRLA. Moist Oral Mucosa.  5. Supple Neck, No JVD, No cervical lymphadenopathy appriciated, No Carotid Bruits.  6. Symmetrical Chest wall movement, moderate air movement bilaterally, few bibasilar rales and minimal wheezing  7. RRR, No Gallops, Rubs or Murmurs, No Parasternal Heave.  8. Positive Bowel Sounds, Abdomen Soft, No tenderness, No organomegaly appriciated,No rebound -guarding or rigidity.  9.  No Cyanosis, Normal Skin Turgor, No Skin Rash or Bruise.  10. Good muscle tone,  joints appear normal , no effusions, Normal ROM.  11. No Palpable Lymph Nodes in Neck or Axillae    Data Review:    CBC  Recent Labs Lab 03/07/16 1602  WBC 15.7*  HGB 13.2  HCT 38.3  PLT 248  MCV 90.3  MCH 31.1  MCHC 34.5  RDW 13.6  LYMPHSABS PENDING  MONOABS PENDING  EOSABS PENDING  BASOSABS PENDING   ------------------------------------------------------------------------------------------------------------------  Chemistries   Recent Labs Lab 03/07/16 1602  NA 135  K 3.5  CL 102  CO2 24  GLUCOSE 114*  BUN 11  CREATININE 1.11*  CALCIUM 8.8*   ------------------------------------------------------------------------------------------------------------------ estimated creatinine clearance is 52.1 mL/min (by C-G formula based on Cr  of 1.11). ------------------------------------------------------------------------------------------------------------------ No results for input(s): TSH, T4TOTAL, T3FREE, THYROIDAB in the last 72 hours.  Invalid input(s): FREET3  Coagulation profile No results for input(s): INR, PROTIME in the last 168 hours. ------------------------------------------------------------------------------------------------------------------- No results for input(s): DDIMER in the last 72 hours. -------------------------------------------------------------------------------------------------------------------  Cardiac Enzymes No results for input(s): CKMB, TROPONINI, MYOGLOBIN in the last 168 hours.  Invalid input(s): CK ------------------------------------------------------------------------------------------------------------------ No results found for: BNP  Lactic Acid, Venous    Component Value Date/Time   LATICACIDVEN 1.22 03/07/2016 1647    ---------------------------------------------------------------------------------------------------------------  Urinalysis    Component Value Date/Time   COLORURINE YELLOW 03/07/2016 1706   APPEARANCEUR CLEAR 03/07/2016 1706   LABSPEC 1.004* 03/07/2016 1706   PHURINE 6.0 03/07/2016 1706   GLUCOSEU NEGATIVE 03/07/2016 1706   HGBUR TRACE* 03/07/2016 1706   BILIRUBINUR NEGATIVE 03/07/2016 1706   KETONESUR NEGATIVE 03/07/2016 1706   PROTEINUR NEGATIVE 03/07/2016 1706   UROBILINOGEN 4.0* 06/03/2012 1648   NITRITE NEGATIVE 03/07/2016 1706   LEUKOCYTESUR NEGATIVE 03/07/2016 1706    ----------------------------------------------------------------------------------------------------------------   Imaging Results:    Dg Chest 2 View  03/07/2016  CLINICAL DATA:  Increasing shortness of breath for 2 days. Chest pain. History of COPD. EXAM: CHEST  2 VIEW COMPARISON:  08/17/2015 FINDINGS: The cardiomediastinal silhouette is unchanged and within normal  limits. The lungs are hyperinflated  with severe bullous change again seen in the right upper lobe. No definite pneumothorax is identified. Bilateral lower lobe predominant interstitial densities are chronic and unchanged. No definite acute airspace consolidation, overt pulmonary edema, or pleural effusion is identified. Prior ACDF. IMPRESSION: Chronic lung disease including bullous emphysema. No definite acute abnormality identified. Electronically Signed   By: Logan Bores M.D.   On: 03/07/2016 16:22    My personal review of EKG: Rhythm NSR,  no Acute ST changes   Assessment & Plan:     1. Community-acquired pneumonia causing acute on chronic hypoxic respiratory failure in a patient with advanced COPD and ongoing smoking. Will be admitted to a telemetry bed, continuous pulse ox monitoring, she is currently requiring 5-6 L nasal cannula oxygen but appears comfortable on that, will place on appropriate IV antibiotics, sputum and blood cultures, nebulizer treatments scheduled along with as needed, oxygen supplementation, add flutter valve for pulmonary toiletry. Monitor closely. Goal for her pulse ox will be 88-89%.  2. COPD. With minimal exacerbation, minimal wheezing. However she has predominantly emphysematous changes hence wheezing could be misleading, as she short of breath will place her on steroids along with treatment as above.  3. Smoking. Ongoing counseled to quit.  4. Chronic pain. Home medications to be continued. No additional narcotics patient was told clearly, Tylenol for headache.  5. Essential hypertension. Continue Norvasc, and as needed IV hydralazine.    DVT Prophylaxis  Lovenox   AM Labs Ordered, also please review Full Orders  Family Communication: Admission, patients condition and plan of care including tests being ordered have been discussed with the patient who indicates understanding and agree with the plan and Code Status.  Code Status Full  Likely DC to  Home 2-3  days  Condition GUARDED     Consults called: None    Admission status: Inpt  Time spent in minutes : 35   Sunset Joshi K M.D on 03/07/2016 at 5:58 PM  Between 7am to 7pm - Pager - [phone]. After 7pm go to [email or web address] - password [facility]  [facility] - Office  [phone]

## 2016-03-07 NOTE — ED Notes (Signed)
Patient transported to X-ray 

## 2016-03-07 NOTE — ED Provider Notes (Addendum)
CSN: [id number]     Arrival date & time 03/07/16  1535 History   First MD Initiated Contact with Patient 03/07/16 1550     Chief Complaint  Patient presents with  . Shortness of Breath  . Headache     (Consider location/radiation/quality/duration/timing/severity/associated sxs/prior Treatment) HPI Comments: Patient history of COPD presents with cough and shortness of breath. She states over the last week she's had worsening cough which is productive of yellow sputum. She's having worsening shortness of breath where she can only walk short distances without becoming short of breath. She's had subjective fevers. She denies any nausea or vomiting. She does have runny nose and nasal congestion. She has some soreness in the center of her chest which is worse with coughing and movement. She denies any leg pain or swelling. She normally uses oxygen at home only at night but has been using it all the time over the last few days.  Patient is a 60 y.o. female presenting with shortness of breath and headaches.  Shortness of Breath Associated symptoms: chest pain, cough, fever and headaches   Associated symptoms: no abdominal pain, no diaphoresis, no rash and no vomiting   Headache Associated symptoms: cough, fatigue and fever   Associated symptoms: no abdominal pain, no back pain, no congestion, no diarrhea, no dizziness, no nausea, no numbness, no vomiting and no weakness     Past Medical History  Diagnosis Date  . COPD (chronic obstructive pulmonary disease) ([place])   . Spinal stenosis   . Hypertension   . On home O2     "~ 3L once/wk and prn" (03/16/2015)  . Tobacco use   . Chronic pain   . Pain management   . Kidney stone   . Family history of adverse reaction to anesthesia     "my mother had an allergic reaction when she had kidney removed back in the '60's or '70's""  . Pneumonia     "@ least once/yr for the past 8 yrs" (03/16/2015)  . Chronic bronchitis ([place])     "I basically keep it"  (03/16/2015)  . Anemia     "as a child"  . History of stomach ulcers   . Hepatitis C   . Headache     "monthly; need glasses; comes on when I'm stressed or get too tired" (03/16/2015)  . Degenerative disc disease   . DDD (degenerative disc disease), lumbar   . Arthritis     "shoulders; back" (03/16/2015)  . Chronic back pain   . Anxiety   . Depression    Past Surgical History  Procedure Laterality Date  . Anterior cervical decomp/discectomy fusion  2002  . Total abdominal hysterectomy  1998  . Incontinence surgery  1998  . Femur im nail Right 11/28/2012    Procedure: INTRAMEDULLARY (IM) RETROGRADE FEMORAL NAILING wants jackson table , c-arm and biomet ;  Surgeon: Mauri Pole, MD;  Location: [facility];  Service: Orthopedics;  Laterality: Right;  . Back surgery    . Fracture surgery     No family history on file. Social History  Substance Use Topics  . Smoking status: Current Every Day Smoker -- 0.25 packs/day for 36 years    Types: Cigarettes  . Smokeless tobacco: Never Used  . Alcohol Use: No     Comment: denies h/o alcohol abuse on 03/16/2015   OB History    No data available     Review of Systems  Constitutional: Positive for fever and fatigue. Negative  for chills and diaphoresis.  HENT: Negative for congestion, rhinorrhea and sneezing.   Eyes: Negative.   Respiratory: Positive for cough and shortness of breath. Negative for chest tightness.   Cardiovascular: Positive for chest pain. Negative for leg swelling.  Gastrointestinal: Negative for nausea, vomiting, abdominal pain, diarrhea and blood in stool.  Genitourinary: Negative for frequency, hematuria, flank pain and difficulty urinating.  Musculoskeletal: Negative for back pain and arthralgias.  Skin: Negative for rash.  Neurological: Positive for headaches. Negative for dizziness, speech difficulty, weakness and numbness.      Allergies  Aspirin  Home Medications   Prior to Admission medications   Medication Sig  Start Date End Date Taking? Authorizing Provider  albuterol (PROVENTIL) (2.5 MG/3ML) 0.083% nebulizer solution Take 6 mLs (5 mg total) by nebulization every 6 (six) hours as needed for wheezing or shortness of breath. 08/17/15  Yes Larene Pickett, PA-C  DULoxetine (CYMBALTA) 60 MG capsule Take 60 mg by mouth 2 (two) times daily.   Yes Historical Provider, MD  gabapentin (NEURONTIN) 800 MG tablet Take 800 mg by mouth 3 (three) times daily.   Yes Historical Provider, MD  methadone (DOLOPHINE) 10 MG/ML solution Take 20 mg by mouth daily.   Yes Historical Provider, MD  tiotropium (SPIRIVA HANDIHALER) 18 MCG inhalation capsule Place 1 capsule (18 mcg total) into inhaler and inhale daily. 05/07/14  Yes Kathie Dike, MD  tiZANidine (ZANAFLEX) 4 MG tablet Take 4 mg by mouth 2 (two) times daily.   Yes Historical Provider, MD  amLODipine (NORVASC) 5 MG tablet Take 1 tablet (5 mg total) by mouth daily. Patient not taking: Reported on 03/07/2016 03/19/15   Orson Eva, MD  benzonatate (TESSALON) 100 MG capsule Take 1 capsule (100 mg total) by mouth 3 (three) times daily as needed for cough. Patient not taking: Reported on 08/17/2015 03/19/15   Orson Eva, MD  Fluticasone-Salmeterol (ADVAIR DISKUS) 250-50 MCG/DOSE AEPB Inhale 1 puff into the lungs 2 (two) times daily. Patient not taking: Reported on 03/07/2016 05/07/14   Kathie Dike, MD  levofloxacin (LEVAQUIN) 750 MG tablet Take 1 tablet (750 mg total) by mouth daily. Patient not taking: Reported on 08/17/2015 03/20/15   Orson Eva, MD  predniSONE (DELTASONE) 20 MG tablet Take 2 tablets (40 mg total) by mouth daily. Take 40 mg by mouth daily for 3 days, then '20mg'$  by mouth daily for 3 days, then '10mg'$  daily for 3 days Patient not taking: Reported on 03/07/2016 08/17/15   Larene Pickett, PA-C  Respiratory Therapy Supplies (NEBULIZER) DEVI Nebulizer for home use. 08/17/15   Larene Pickett, PA-C   BP 141/85 mmHg  Pulse 94  Temp(Src) 100.3 F (37.9 C) (Oral)  Resp 15  Ht  '5\' 6"'$  (1.676 m)  Wt 130 lb (58.968 kg)  BMI 20.99 kg/m2  SpO2 90% Physical Exam  Constitutional: She is oriented to person, place, and time. She appears well-developed and well-nourished.  HENT:  Head: Normocephalic and atraumatic.  Eyes: Pupils are equal, round, and reactive to light.  Neck: Normal range of motion. Neck supple.  Cardiovascular: Normal rate, regular rhythm and normal heart sounds.   Pulmonary/Chest: Effort normal. No respiratory distress. She has no wheezes. She has rales. She exhibits tenderness.  Abdominal: Soft. Bowel sounds are normal. There is no tenderness. There is no rebound and no guarding.  Musculoskeletal: Normal range of motion. She exhibits no edema.  No edema or calf tenderness  Lymphadenopathy:    She has no cervical adenopathy.  Neurological:  She is alert and oriented to person, place, and time.  Skin: Skin is warm and dry. No rash noted.  Psychiatric: She has a normal mood and affect.    ED Course  Procedures (including critical care time) Labs Review Results for orders placed or performed during the hospital encounter of 41/74/08  Basic metabolic panel  Result Value Ref Range   Sodium 135 135 - 145 mmol/L   Potassium 3.5 3.5 - 5.1 mmol/L   Chloride 102 101 - 111 mmol/L   CO2 24 22 - 32 mmol/L   Glucose, Bld 114 (H) 65 - 99 mg/dL   BUN 11 6 - 20 mg/dL   Creatinine, Ser 1.11 (H) 0.44 - 1.00 mg/dL   Calcium 8.8 (L) 8.9 - 10.3 mg/dL   GFR calc non Af Amer 54 (L) >60 mL/min   GFR calc Af Amer >60 >60 mL/min   Anion gap 9 5 - 15  CBC  Result Value Ref Range   WBC 15.7 (H) 4.0 - 10.5 K/uL   RBC 4.24 3.87 - 5.11 MIL/uL   Hemoglobin 13.2 12.0 - 15.0 g/dL   HCT 38.3 36.0 - 46.0 %   MCV 90.3 78.0 - 100.0 fL   MCH 31.1 26.0 - 34.0 pg   MCHC 34.5 30.0 - 36.0 g/dL   RDW 13.6 11.5 - 15.5 %   Platelets 248 150 - 400 K/uL  Differential  Result Value Ref Range   Neutrophils Relative % PENDING %   Neutro Abs PENDING 1.7 - 7.7 K/uL   Band  Neutrophils PENDING %   Lymphocytes Relative PENDING %   Lymphs Abs PENDING 0.7 - 4.0 K/uL   Monocytes Relative PENDING %   Monocytes Absolute PENDING 0.1 - 1.0 K/uL   Eosinophils Relative PENDING %   Eosinophils Absolute PENDING 0.0 - 0.7 K/uL   Basophils Relative PENDING %   Basophils Absolute PENDING 0.0 - 0.1 K/uL   WBC Morphology PENDING    RBC Morphology PENDING    Smear Review PENDING    nRBC PENDING 0 /100 WBC   Metamyelocytes Relative PENDING %   Myelocytes PENDING %   Promyelocytes Absolute PENDING %   Blasts PENDING %  I-stat troponin, ED  Result Value Ref Range   Troponin i, poc 0.01 0.00 - 0.08 ng/mL   Comment 3          I-Stat CG4 Lactic Acid, ED  (not at  [facility])  Result Value Ref Range   Lactic Acid, Venous 1.22 0.5 - 2.0 mmol/L   Dg Chest 2 View  03/07/2016  CLINICAL DATA:  Increasing shortness of breath for 2 days. Chest pain. History of COPD. EXAM: CHEST  2 VIEW COMPARISON:  08/17/2015 FINDINGS: The cardiomediastinal silhouette is unchanged and within normal limits. The lungs are hyperinflated with severe bullous change again seen in the right upper lobe. No definite pneumothorax is identified. Bilateral lower lobe predominant interstitial densities are chronic and unchanged. No definite acute airspace consolidation, overt pulmonary edema, or pleural effusion is identified. Prior ACDF. IMPRESSION: Chronic lung disease including bullous emphysema. No definite acute abnormality identified. Electronically Signed   By: Logan Bores M.D.   On: 03/07/2016 16:22   US Abdomen Complete W/elastography  02/16/2016  CLINICAL DATA:  Hepatitis C, diagnosed 4-5 years ago EXAM: ULTRASOUND ABDOMEN COMPLETE ULTRASOUND HEPATIC ELASTOGRAPHY TECHNIQUE: Sonography of the upper abdomen was performed. In addition, ultrasound elastography evaluation of the liver was performed. A region of interest was placed within the right lobe  of the liver. Following application of a compressive sonographic  pulse, shear waves were detected in the adjacent hepatic tissue and the shear wave velocity was calculated. Multiple assessments were performed at the selected site. Median shear wave velocity is correlated to a Metavir fibrosis score. COMPARISON:  None. FINDINGS: ULTRASOUND ABDOMEN Gallbladder: No gallstones, gallbladder wall thickening, or pericholecystic fluid. Negative sonographic Murphy's sign. Common bile duct: Diameter: 7 mm Liver: No focal lesion identified. Within normal limits in parenchymal echogenicity. IVC: No abnormality visualized. Pancreas: Visualized portion unremarkable. Spleen: Size and appearance within normal limits. Right Kidney: Length: 12.0 cm. 6 x 6 x 7 mm interpolar cyst. No hydronephrosis. Left Kidney: Length: 11.1 cm. Multiple cysts, measuring up to 2.1 x 2.3 x 1.9 cm in the lower pole. No hydronephrosis. Abdominal aorta: No aneurysm visualized.  Atherosclerosis. Other findings: None. ULTRASOUND HEPATIC ELASTOGRAPHY Device: Siemens Helix VTQ Patient position:  Left Lateral Decubitus Transducer 6C1 Number of measurements:  10 Hepatic Segment:  8 Median velocity:   1.31  m/sec IQR: 0.23 IQR/Median velocity ratio 0.175 Corresponding Metavir fibrosis score:  F2 + some F3 Risk of fibrosis: Moderate Limitations of exam: None Pertinent findings noted on other imaging exams:  None Please note that abnormal shear wave velocities may also be identified in clinical settings other than with hepatic fibrosis, such as: acute hepatitis, elevated right heart and central venous pressures including use of beta blockers, veno-occlusive disease (Budd-Chiari), infiltrative processes such as mastocytosis/amyloidosis/infiltrative tumor, extrahepatic cholestasis, in the post-prandial state, and liver transplantation. Correlation with patient history, laboratory data, and clinical condition recommended. IMPRESSION: Bilateral renal cysts, measuring up to 2.3 cm in the left lower pole, predominantly simple. No  hydronephrosis. Median hepatic shear wave velocity is calculated at 1.31 m/sec. Corresponding Metavir fibrosis score is F2 + some F3. Risk of fibrosis is moderate. Follow-up:  Additional testing appropriate Electronically Signed   By: Julian Hy M.D.   On: 02/16/2016 08:12      Imaging Review Dg Chest 2 View  03/07/2016  CLINICAL DATA:  Increasing shortness of breath for 2 days. Chest pain. History of COPD. EXAM: CHEST  2 VIEW COMPARISON:  08/17/2015 FINDINGS: The cardiomediastinal silhouette is unchanged and within normal limits. The lungs are hyperinflated with severe bullous change again seen in the right upper lobe. No definite pneumothorax is identified. Bilateral lower lobe predominant interstitial densities are chronic and unchanged. No definite acute airspace consolidation, overt pulmonary edema, or pleural effusion is identified. Prior ACDF. IMPRESSION: Chronic lung disease including bullous emphysema. No definite acute abnormality identified. Electronically Signed   By: Logan Bores M.D.   On: 03/07/2016 16:22   I have personally reviewed and evaluated these images and lab results as part of my medical decision-making.   EKG Interpretation   Date/Time:  Wednesday Mar 07 2016 15:45:12 EDT Ventricular Rate:  90 PR Interval:  131 QRS Duration: 105 QT Interval:  374 QTC Calculation: 458 R Axis:   85 Text Interpretation:  Sinus rhythm Probable left atrial enlargement  Borderline T wave abnormalities since last tracing no significant change  Confirmed by Deleah Tison  MD, Airanna Partin ([id number]) on 03/07/2016 4:39:04 PM      MDM   Final diagnoses:  CAP (community acquired pneumonia)  COPD exacerbation ([place])    Pt meets SIRS criteria for sepsis with fever, tachycardia and tachypnea.  Started sepsis protocol with abx.  Chest x-ray is clear. Patient has clinical symptoms of pneumonia and was treated with antibiotics for likely community acquired pneumonia. Her oxygen  saturation  saturations have been hovering in the upper 80s. She was given a nebulizer treatment and improved her sats to the lower 90s. She does feel better and she is talking in full sentences. I spoke with the hospitalist, Dr. Candiss Norse to admit the patient to telemetry bed. Given her normal lactate, patient was given a 1 L fluid bolus but not the 30 cc/kg bolus.  Malvin Johns, MD 03/07/16 1745  Malvin Johns, MD 03/07/16 [phone]

## 2016-03-08 LAB — BASIC METABOLIC PANEL
Anion gap: 10 (ref 5–15)
BUN: 10 mg/dL (ref 6–20)
CHLORIDE: 107 mmol/L (ref 101–111)
CO2: 25 mmol/L (ref 22–32)
CREATININE: 0.93 mg/dL (ref 0.44–1.00)
Calcium: 9.1 mg/dL (ref 8.9–10.3)
GFR calc Af Amer: >60 mL/min (ref >60)
GFR calc non Af Amer: >60 mL/min (ref >60)
GLUCOSE: 139 mg/dL — AB (ref 65–99)
POTASSIUM: 3.4 mmol/L — AB (ref 3.5–5.1)
SODIUM: 142 mmol/L (ref 135–145)

## 2016-03-08 LAB — CBC
HCT: 40 % (ref 36.0–46.0)
HEMOGLOBIN: 13 g/dL (ref 12.0–15.0)
MCH: 30.3 pg (ref 26.0–34.0)
MCHC: 32.5 g/dL (ref 30.0–36.0)
MCV: 93.2 fL (ref 78.0–100.0)
Platelets: 247 10*3/uL (ref 150–400)
RBC: 4.29 MIL/uL (ref 3.87–5.11)
RDW: 14.1 % (ref 11.5–15.5)
WBC: 16.5 10*3/uL — ABNORMAL HIGH (ref 4.0–10.5)

## 2016-03-08 LAB — URINE CULTURE: Culture: 6000 — AB

## 2016-03-08 LAB — HIV ANTIBODY (ROUTINE TESTING W REFLEX): HIV Screen 4th Generation wRfx: NONREACTIVE

## 2016-03-08 MED ORDER — POTASSIUM CHLORIDE CRYS ER 20 MEQ PO TBCR
40.0000 meq | EXTENDED_RELEASE_TABLET | Freq: Four times a day (QID) | ORAL | Status: AC
  Administered 2016-03-08 (×2): 40 meq via ORAL
  Filled 2016-03-08 (×2): qty 2

## 2016-03-08 MED ORDER — CETYLPYRIDINIUM CHLORIDE 0.05 % MT LIQD
7.0000 mL | Freq: Two times a day (BID) | OROMUCOSAL | Status: DC
Start: 2016-03-08 — End: 2016-03-09
  Administered 2016-03-08: 7 mL via OROMUCOSAL

## 2016-03-08 MED ORDER — METHADONE HCL 10 MG/ML PO CONC
70.0000 mg | Freq: Every day | ORAL | Status: DC
  Administered 2016-03-08 – 2016-03-09 (×2): 70 mg via ORAL
  Filled 2016-03-08 (×3): qty 7

## 2016-03-08 NOTE — Consult Note (Signed)
   [facility] CM Inpatient Consult   03/08/2016  Emily Moran 01-15-1956 [id number]    Patient screened for [place] Management services for history of COPD. Noted Primary Care MD is Dr. Jeanie Cooks. Confirmed with patient at bedside that her Primary Care MD is Dr. Jeanie Cooks. She is not eligible for [facility] Care Management program at this time as Dr. Jeanie Cooks is no longer at [facility] provider. Patient expresses understanding. Will make inpatient RNCM aware.   Marthenia Rolling, MSN-Ed, RN,BSN [facility] General Par Liaison [phone]

## 2016-03-08 NOTE — Progress Notes (Signed)
PROGRESS NOTE                                                                                                                                                                                                             Patient Demographics:    Emily Moran, is a 60 y.o. female, DOB - [birth year], WHQ:[id number]  Admit date - 03/07/2016   Admitting Physician Thurnell Lose, MD  Outpatient Primary MD for the patient is Philis Fendt, MD  LOS - 1  Chief Complaint  Patient presents with  . Shortness of Breath  . Headache       Brief Narrative: Emily Moran 60 y.o. Female presented with a history of COPD on 2-3 L nasal cannula oxygen at home, 9 pack/year smoking history, essential hypertension, chronic pain on methadone, and spinal stenosis.  Came to the ED yesterday with a productive cough x 1 week. Associates SOB, fever, and chest pain. Clinical diagnosis of community-acquired pneumonia was made in the ED with CXR only showing chronic lung disease. Admitted yesterday and began antibiotic therapy.    Subjective:    [name], No headache, No chest pain, No abdominal pain - No Nausea, No new weakness tingling or numbness, No Cough - SOB. States that she is improving.     Assessment  & Plan :     1.CAP Community acquired pneumonia presenting in the ED yesterday with SOB, chest pain, cough, fever, and bibasilar rales. CXR shows chronic lung disease. CBC shows leukocytosis.  Blood cultures have been done and awaiting results.  Pulse ox monitoring, 4 L of oxygen nasal cannula, IV Rochephin, and oral Azithromycin have been implemented. We will continue current medical treatment, having the patient sit up in chair through out the day.     2. Hepatitis C No acute problems. Follow up with outpatient GI consult.  3. Tobacco Abuse Counseled to quit smoking  4. Substance abuseAnd chronic pain She is on very high  doses of oral methadone, will continue but at her lower dose. Monitor for any withdrawal.  5. Acute Respiratory Failure with Hypoxia Presented to the ED with an oxygen saturation of 90%. Currently on 4 L oxygen by nasal cannula. Continue current medical treatment as long as her saturation maintains 90% with a goal of 88-90%  6. COPD with acute exacerbation  Presented with mild wheezing. Current treatment is methylpredinsolone. Patient states she is improving and will continue current treatment    Code Status :  Full  Family Communication  :  No family in room  Disposition Plan  :  Acute home in 1-2 days  Consults  :  None  Procedures  :  none  DVT Prophylaxis  :  Lovenox  Lab Results  Component Value Date   PLT 247 03/08/2016    Inpatient Medications  Scheduled Meds: . amLODipine  5 mg Oral Daily  . antiseptic oral rinse  7 mL Mouth Rinse BID  . azithromycin  500 mg Oral Q24H  . cefTRIAXone (ROCEPHIN)  IV  1 g Intravenous Q24H  . DULoxetine  60 mg Oral BID  . enoxaparin (LOVENOX) injection  40 mg Subcutaneous Q24H  . gabapentin  600 mg Oral TID  . ipratropium-albuterol  3 mL Nebulization TID  . methadone  70 mg Oral Daily  . methylPREDNISolone (SOLU-MEDROL) injection  60 mg Intravenous Q8H  . mometasone-formoterol  2 puff Inhalation BID  . potassium chloride  40 mEq Oral Q6H  . tiZANidine  4 mg Oral BID   Continuous Infusions: . sodium chloride     PRN Meds:.acetaminophen, albuterol, benzonatate, hydrALAZINE, ondansetron (ZOFRAN) IV  Antibiotics  :    Anti-infectives    Start     Dose/Rate Route Frequency Ordered Stop   03/08/16 1800  cefTRIAXone (ROCEPHIN) 1 g in dextrose 5 % 50 mL IVPB     1 g 100 mL/hr over 30 Minutes Intravenous Every 24 hours 03/07/16 2033 03/15/16 1759   03/08/16 1700  azithromycin (ZITHROMAX) 500 mg in dextrose 5 % 250 mL IVPB  Status:  Discontinued     500 mg 250 mL/hr over 60 Minutes Intravenous Every 24 hours 03/07/16 1645 03/07/16  2031   03/08/16 1600  cefTRIAXone (ROCEPHIN) 1 g in dextrose 5 % 50 mL IVPB  Status:  Discontinued     1 g 100 mL/hr over 30 Minutes Intravenous Every 24 hours 03/07/16 1645 03/07/16 2032   03/08/16 0900  azithromycin (ZITHROMAX) tablet 500 mg     500 mg Oral Every 24 hours 03/07/16 1744 03/15/16 0859   03/07/16 1745  cefTRIAXone (ROCEPHIN) 1 g in dextrose 5 % 50 mL IVPB  Status:  Discontinued     1 g 100 mL/hr over 30 Minutes Intravenous Every 24 hours 03/07/16 1743 03/07/16 2033   03/07/16 1745  azithromycin (ZITHROMAX) tablet 500 mg  Status:  Discontinued     500 mg Oral Every 24 hours 03/07/16 1743 03/07/16 1744   03/07/16 1615  cefTRIAXone (ROCEPHIN) 1 g in dextrose 5 % 50 mL IVPB     1 g 100 mL/hr over 30 Minutes Intravenous  Once 03/07/16 1612 03/07/16 1816   03/07/16 1615  azithromycin (ZITHROMAX) 500 mg in dextrose 5 % 250 mL IVPB     500 mg 250 mL/hr over 60 Minutes Intravenous  Once 03/07/16 1612 03/07/16 1745         Objective:   Filed Vitals:   03/07/16 2239 03/08/16 0429 03/08/16 0755 03/08/16 0829  BP: 142/85 145/88 150/89   Pulse:  78 89   Temp:  98 F (36.7 C) 97.9 F (36.6 C)   TempSrc:  Oral Oral   Resp:  18 20   Height:      Weight:      SpO2:  95% 96% 95%    Wt Readings from Last 3 Encounters:  03/07/16 58.287 kg (128 lb 8 oz)  08/17/15 56.7 kg (125 lb)  03/19/15 57.4 kg (126 lb 8.7 oz)     Intake/Output Summary (Last 24 hours) at 03/08/16 1011 Last data filed at 03/07/16 2300  Gross per 24 hour  Intake    290 ml  Output      0 ml  Net    290 ml     Physical Exam  Awake Alert, Oriented X 3, No new F.N deficits, Normal affect [place].AT,PERRAL Supple Neck,No JVD, No cervical lymphadenopathy appriciated.  Symmetrical Chest wall movement, improved air movement with mild bibasilar rales and wheezing. RRR,No Gallops,Rubs or new Murmurs, No Parasternal Heave +ve B.Sounds, Abd Soft, No tenderness, No organomegaly appriciated, No rebound - guarding  or rigidity. No Cyanosis, Clubbing or edema, No new Rash or bruise      Data Review:    CBC  Recent Labs Lab 03/07/16 1602 03/08/16 0516  WBC 15.7* 16.5*  HGB 13.2 13.0  HCT 38.3 40.0  PLT 248 247  MCV 90.3 93.2  MCH 31.1 30.3  MCHC 34.5 32.5  RDW 13.6 14.1  LYMPHSABS PENDING  --   MONOABS PENDING  --   EOSABS PENDING  --   BASOSABS PENDING  --     Chemistries   Recent Labs Lab 03/07/16 1602 03/08/16 0516  NA 135 142  K 3.5 3.4*  CL 102 107  CO2 24 25  GLUCOSE 114* 139*  BUN 11 10  CREATININE 1.11* 0.93  CALCIUM 8.8* 9.1   ------------------------------------------------------------------------------------------------------------------ No results for input(s): CHOL, HDL, LDLCALC, TRIG, CHOLHDL, LDLDIRECT in the last 72 hours.  Lab Results  Component Value Date   HGBA1C 5.3 03/16/2015   ------------------------------------------------------------------------------------------------------------------ No results for input(s): TSH, T4TOTAL, T3FREE, THYROIDAB in the last 72 hours.  Invalid input(s): FREET3 ------------------------------------------------------------------------------------------------------------------ No results for input(s): VITAMINB12, FOLATE, FERRITIN, TIBC, IRON, RETICCTPCT in the last 72 hours.  Coagulation profile No results for input(s): INR, PROTIME in the last 168 hours.  No results for input(s): DDIMER in the last 72 hours.  Cardiac Enzymes No results for input(s): CKMB, TROPONINI, MYOGLOBIN in the last 168 hours.  Invalid input(s): CK ------------------------------------------------------------------------------------------------------------------ No results found for: BNP  Micro Results Recent Results (from the past 240 hour(s))  Blood Culture (routine x 2)     Status: None (Preliminary result)   Collection Time: 03/07/16  4:38 PM  Result Value Ref Range Status   Specimen Description   Final    BLOOD RIGHT  WRIST Performed at [facility]    Special Requests BOTTLES DRAWN AEROBIC AND ANAEROBIC 5CC  Final   Culture PENDING  Incomplete   Report Status PENDING  Incomplete    Radiology Reports Dg Chest 2 View  03/07/2016  CLINICAL DATA:  Increasing shortness of breath for 2 days. Chest pain. History of COPD. EXAM: CHEST  2 VIEW COMPARISON:  08/17/2015 FINDINGS: The cardiomediastinal silhouette is unchanged and within normal limits. The lungs are hyperinflated with severe bullous change again seen in the right upper lobe. No definite pneumothorax is identified. Bilateral lower lobe predominant interstitial densities are chronic and unchanged. No definite acute airspace consolidation, overt pulmonary edema, or pleural effusion is identified. Prior ACDF. IMPRESSION: Chronic lung disease including bullous emphysema. No definite acute abnormality identified. Electronically Signed   By: Logan Bores M.D.   On: 03/07/2016 16:22   US Abdomen Complete W/elastography  02/16/2016  CLINICAL DATA:  Hepatitis C, diagnosed 4-5 years ago EXAM: [place]  ELASTOGRAPHY TECHNIQUE: Sonography of the upper abdomen was performed. In addition, ultrasound elastography evaluation of the liver was performed. A region of interest was placed within the right lobe of the liver. Following application of a compressive sonographic pulse, shear waves were detected in the adjacent hepatic tissue and the shear wave velocity was calculated. Multiple assessments were performed at the selected site. Median shear wave velocity is correlated to a Metavir fibrosis score. COMPARISON:  None. FINDINGS: ULTRASOUND ABDOMEN Gallbladder: No gallstones, gallbladder wall thickening, or pericholecystic fluid. Negative sonographic Murphy's sign. Common bile duct: Diameter: 7 mm Liver: No focal lesion identified. Within normal limits in parenchymal echogenicity. IVC: No abnormality visualized. Pancreas: Visualized  portion unremarkable. Spleen: Size and appearance within normal limits. Right Kidney: Length: 12.0 cm. 6 x 6 x 7 mm interpolar cyst. No hydronephrosis. Left Kidney: Length: 11.1 cm. Multiple cysts, measuring up to 2.1 x 2.3 x 1.9 cm in the lower pole. No hydronephrosis. Abdominal aorta: No aneurysm visualized.  Atherosclerosis. Other findings: None. ULTRASOUND HEPATIC ELASTOGRAPHY Device: Siemens Helix VTQ Patient position:  Left Lateral Decubitus Transducer 6C1 Number of measurements:  10 Hepatic Segment:  8 Median velocity:   1.31  m/sec IQR: 0.23 IQR/Median velocity ratio 0.175 Corresponding Metavir fibrosis score:  F2 + some F3 Risk of fibrosis: Moderate Limitations of exam: None Pertinent findings noted on other imaging exams:  None Please note that abnormal shear wave velocities may also be identified in clinical settings other than with hepatic fibrosis, such as: acute hepatitis, elevated right heart and central venous pressures including use of beta blockers, veno-occlusive disease (Budd-Chiari), infiltrative processes such as mastocytosis/amyloidosis/infiltrative tumor, extrahepatic cholestasis, in the post-prandial state, and liver transplantation. Correlation with patient history, laboratory data, and clinical condition recommended. IMPRESSION: Bilateral renal cysts, measuring up to 2.3 cm in the left lower pole, predominantly simple. No hydronephrosis. Median hepatic shear wave velocity is calculated at 1.31 m/sec. Corresponding Metavir fibrosis score is F2 + some F3. Risk of fibrosis is moderate. Follow-up:  Additional testing appropriate Electronically Signed   By: Julian Hy M.D.   On: 02/16/2016 08:12    Time Spent in minutes  30   SINGH,PRASHANT K M.D on 03/08/2016 at 10:11 AM  I have directly reviewed the clinical findings, lab results and imaging studies. I have interviewed and examined the patient and agree with the documentation and management as recorded by the PA  student.  Patient admitted with COPD exacerbation along with community-acquired pneumonia, much improved with empiric IV antibiotics, continue. Also on very high doses of methadone, methadone resume but at a lower dose we'll closely monitor for any signs of withdrawal. Increase activity likely discharge tomorrow.  Thurnell Lose M.D on 03/08/2016 at 10:11 AM  [facility] Office  [phone]

## 2016-03-09 ENCOUNTER — Inpatient Hospital Stay (HOSPITAL_COMMUNITY): Payer: Medicare Other

## 2016-03-09 LAB — BASIC METABOLIC PANEL
ANION GAP: 10 (ref 5–15)
BUN: 12 mg/dL (ref 6–20)
CHLORIDE: 108 mmol/L (ref 101–111)
CO2: 23 mmol/L (ref 22–32)
Calcium: 9.4 mg/dL (ref 8.9–10.3)
Creatinine, Ser: 0.92 mg/dL (ref 0.44–1.00)
GFR calc non Af Amer: >60 mL/min (ref >60)
Glucose, Bld: 104 mg/dL — ABNORMAL HIGH (ref 65–99)
Potassium: 4.9 mmol/L (ref 3.5–5.1)
SODIUM: 141 mmol/L (ref 135–145)

## 2016-03-09 LAB — CBC
HEMATOCRIT: 39.4 % (ref 36.0–46.0)
HEMOGLOBIN: 12.7 g/dL (ref 12.0–15.0)
MCH: 30.2 pg (ref 26.0–34.0)
MCHC: 32.2 g/dL (ref 30.0–36.0)
MCV: 93.8 fL (ref 78.0–100.0)
Platelets: 261 10*3/uL (ref 150–400)
RBC: 4.2 MIL/uL (ref 3.87–5.11)
RDW: 14.2 % (ref 11.5–15.5)
WBC: 18.4 10*3/uL — AB (ref 4.0–10.5)

## 2016-03-09 LAB — MAGNESIUM: MAGNESIUM: 2 mg/dL (ref 1.7–2.4)

## 2016-03-09 LAB — STREP PNEUMONIAE URINARY ANTIGEN: STREP PNEUMO URINARY ANTIGEN: NEGATIVE

## 2016-03-09 MED ORDER — AZITHROMYCIN 250 MG PO TABS
250.0000 mg | ORAL_TABLET | Freq: Every day | ORAL | Status: DC
Start: 2016-03-09 — End: 2016-04-08

## 2016-03-09 MED ORDER — CEFPODOXIME PROXETIL 200 MG PO TABS: 200.0000 mg | ORAL_TABLET | Freq: Two times a day (BID) | ORAL | Status: DC

## 2016-03-09 MED ORDER — PREDNISONE 5 MG PO TABS: ORAL_TABLET | ORAL | Status: DC

## 2016-03-09 MED ORDER — IPRATROPIUM-ALBUTEROL 0.5-2.5 (3) MG/3ML IN SOLN: 3.0000 mL | Freq: Two times a day (BID) | RESPIRATORY_TRACT | Status: DC

## 2016-03-09 NOTE — Discharge Instructions (Signed)
Follow with Primary MD Philis Fendt, MD in 3-5 days   Get CBC, CMP, 2 view Chest X ray checked  by Primary MD next visit.    Activity: As tolerated with Full fall precautions use walker/cane & assistance as needed   Disposition Home     Diet:   Heart Healthy    For Heart failure patients - Check your Weight same time everyday, if you gain over 2 pounds, or you develop in leg swelling, experience more shortness of breath or chest pain, call your Primary MD immediately. Follow Cardiac Low Salt Diet and 1.5 lit/day fluid restriction.   On your next visit with your primary care physician please Get Medicines reviewed and adjusted.   Please request your Prim.MD to go over all Hospital Tests and Procedure/Radiological results at the follow up, please get all Hospital records sent to your Prim MD by signing hospital release before you go home.   If you experience worsening of your admission symptoms, develop shortness of breath, life threatening emergency, suicidal or homicidal thoughts you must seek medical attention immediately by calling 911 or calling your MD immediately  if symptoms less severe.  You Must read complete instructions/literature along with all the possible adverse reactions/side effects for all the Medicines you take and that have been prescribed to you. Take any new Medicines after you have completely understood and accpet all the possible adverse reactions/side effects.   Do not drive, operate heavy machinery, perform activities at heights, swimming or participation in water activities or provide baby sitting services if your were admitted for syncope or siezures until you have seen by Primary MD or a Neurologist and advised to do so again.  Do not drive when taking Pain medications.    Do not take more than prescribed Pain, Sleep and Anxiety Medications  Special Instructions: If you have smoked or chewed Tobacco  in the last 2 yrs please stop smoking, stop any  regular Alcohol  and or any Recreational drug use.  Wear Seat belts while driving.   Please note  You were cared for by a hospitalist during your hospital stay. If you have any questions about your discharge medications or the care you received while you were in the hospital after you are discharged, you can call the unit and asked to speak with the hospitalist on call if the hospitalist that took care of you is not available. Once you are discharged, your primary care physician will handle any further medical issues. Please note that NO REFILLS for any discharge medications will be authorized once you are discharged, as it is imperative that you return to your primary care physician (or establish a relationship with a primary care physician if you do not have one) for your aftercare needs so that they can reassess your need for medications and monitor your lab values.

## 2016-03-09 NOTE — Progress Notes (Signed)
WEnt over all discharge information with patient.  All questions answered.  Pt stated she did not need a portable o2 tank to go home on.  States she lives 5 minutes away and will put o2 on when she gets home.  Monitored patient saturation on RA, sustained in the 90's.  Prescriptions and discharge information given.  Pt wheeled out by NT.

## 2016-03-09 NOTE — Discharge Summary (Signed)
Emily Moran, is a 60 y.o. female  DOB 1956/03/08  MRN [id number].  Admission date:  03/07/2016  Admitting Physician  Thurnell Lose, MD  Discharge Date:  03/09/2016   Primary MD  Philis Fendt, MD  Recommendations for primary care physician for things to follow:   Patient Discharge with community acquired pneumonia on  Azithromycin, Vantin, and steroid taper. Follow up with patient in 1 week to check infection status obtaining a CBC and CMP. Continue to manage smoking cessation. Follow Hepatitis C with GI outpatient. Taper methadone dose, patient states she is on 150 mg of methadone daily.    Admission Diagnosis  COPD exacerbation ([place]) [J44.1] CAP (community acquired pneumonia) [J18.9]   Discharge Diagnosis  COPD exacerbation ([place]) [J44.1] CAP (community acquired pneumonia) [J18.9]     Principal Problem:   CAP (community acquired pneumonia) Active Problems:   Hepatitis C   TOBACCO ABUSE   Substance abuse   Acute respiratory failure with hypoxia ([facility])   COPD with acute exacerbation ([place])       Past Medical History  Diagnosis Date  . COPD (chronic obstructive pulmonary disease) ([place])   . Spinal stenosis   . Hypertension   . On home O2     "~ 3L once/wk and prn" (03/16/2015)  . Tobacco use   . Chronic pain   . Pain management   . Kidney stone   . Family history of adverse reaction to anesthesia     "my mother had an allergic reaction when she had kidney removed back in the '60's or '70's""  . Pneumonia     "@ least once/yr for the past 8 yrs" (03/16/2015)  . Chronic bronchitis ([place])     "I basically keep it" (03/16/2015)  . Anemia     "as a child"  . History of stomach ulcers   . Hepatitis C   . Headache     "monthly; need glasses; comes on when I'm stressed or get too tired" (03/16/2015)  .  Degenerative disc disease   . DDD (degenerative disc disease), lumbar   . Arthritis     "shoulders; back" (03/16/2015)  . Chronic back pain   . Anxiety   . Depression     Past Surgical History  Procedure Laterality Date  . Anterior cervical decomp/discectomy fusion  2002  . Total abdominal hysterectomy  1998  . Incontinence surgery  1998  . Femur im nail Right 11/28/2012    Procedure: INTRAMEDULLARY (IM) RETROGRADE FEMORAL NAILING wants jackson table , c-arm and biomet ;  Surgeon: Mauri Pole, MD;  Location: [facility];  Service: Orthopedics;  Laterality: Right;  . Back surgery    . Fracture surgery         HPI  from the history and physical done on the day of admission:    Emily Moran is a 60 y.o. female, With history of COPD on 2-3 L nasal cannula oxygen at home, does not have a pulmonologist, chronic pain on methadone, ongoing smoking counseled  to quit, kidney stones, essential hypertension, spinal stenosis, who comes to the hospital with 1 week history of productive cough and gradually progressive shortness of breath. She also had subjective fevers at home. Mild headaches from persistent coughing, no exposure to sick contacts, no recent travel, no chest pain, no other subjective complaints.  In the ER clinical diagnosis of community-acquired pneumonia with acute on chronic hypoxic respiratory failure and I was called to admit the patient. Chest x-ray surprisingly is clean, she did have fever along with leukocytosis and productive cough.    Hospital Course:    1. CAP (Community Acquired Pneumonia) SOB, productive cough, fever, and bibasilar rales on presentation. CXR, CBC, BMP were monitored during care. .  Given Rochephin and Azithromycin during stay. Patient has improved and is stable to discharge. WBC on discharge is 18,400 due to steroids and infection. Will discharge on Azithromycin and Vantin for 5 days. In addition we will have her on a prednisone taper. Follow up with PCP in  1 week getting a CBC and CMP & a 2 view CXR.   2. Hepatitis C No acute problems. Follow up with outpatient GI consult.  3. Tobacco Abuse Counseled to quit. Follow up with PCP for continued management.  4. Substance abuseAnd chronic pain She is on high dose of methadone. Need to follow up with PCP & MD at [facility]  to taper methadone.   5. Acute Respiratory Failure with hypoxia Continue to use home oxygen of 2-3 liters by nasal cannula, back to baseline.  6. COPD with acute exacerbation Presented with wheezing. Wheezing has improved. Will will continue treatment of prednisone taper and antibiotic therapy that is consistent  with CAP (see above).    Follow UP  Follow-up Information    Follow up with Philis Fendt, MD. Schedule an appointment as soon as possible for a visit in 1 week.   Specialty:  Internal Medicine   Contact information:   [address] [place] [address] [phone]       Follow up with [place] In 1 day.   Specialty:  Behavioral Health   Contact information:   [place]  [id number] [phone]        Consults obtained - none  Discharge Condition: Stable  Diet and Activity recommendation: See Discharge Instructions below  Discharge Instructions       Discharge Instructions    Diet - low sodium heart healthy    Complete by:  As directed      Discharge instructions    Complete by:  As directed   Follow with Primary MD Philis Fendt, MD in 3-5 days   Get CBC, CMP, 2 view Chest X ray checked  by Primary MD next visit.    Activity: As tolerated with Full fall precautions use walker/cane & assistance as needed   Disposition Home     Diet:   Heart Healthy    For Heart failure patients - Check your Weight same time everyday, if you gain over 2 pounds, or you develop in leg swelling, experience more shortness of breath or chest pain, call your Primary MD immediately. Follow Cardiac  Low Salt Diet and 1.5 lit/day fluid restriction.   On your next visit with your primary care physician please Get Medicines reviewed and adjusted.   Please request your Prim.MD to go over all Hospital Tests and Procedure/Radiological results at the follow up, please get all Hospital records sent to your Prim MD  by signing hospital release before you go home.   If you experience worsening of your admission symptoms, develop shortness of breath, life threatening emergency, suicidal or homicidal thoughts you must seek medical attention immediately by calling 911 or calling your MD immediately  if symptoms less severe.  You Must read complete instructions/literature along with all the possible adverse reactions/side effects for all the Medicines you take and that have been prescribed to you. Take any new Medicines after you have completely understood and accpet all the possible adverse reactions/side effects.   Do not drive, operate heavy machinery, perform activities at heights, swimming or participation in water activities or provide baby sitting services if your were admitted for syncope or siezures until you have seen by Primary MD or a Neurologist and advised to do so again.  Do not drive when taking Pain medications.    Do not take more than prescribed Pain, Sleep and Anxiety Medications  Special Instructions: If you have smoked or chewed Tobacco  in the last 2 yrs please stop smoking, stop any regular Alcohol  and or any Recreational drug use.  Wear Seat belts while driving.   Please note  You were cared for by a hospitalist during your hospital stay. If you have any questions about your discharge medications or the care you received while you were in the hospital after you are discharged, you can call the unit and asked to speak with the hospitalist on call if the hospitalist that took care of you is not available. Once you are discharged, your primary care physician will handle any  further medical issues. Please note that NO REFILLS for any discharge medications will be authorized once you are discharged, as it is imperative that you return to your primary care physician (or establish a relationship with a primary care physician if you do not have one) for your aftercare needs so that they can reassess your need for medications and monitor your lab values.     Increase activity slowly    Complete by:  As directed              Discharge Medications       Medication List    STOP taking these medications        levofloxacin 750 MG tablet  Commonly known as:  LEVAQUIN     predniSONE 20 MG tablet  Commonly known as:  DELTASONE  Replaced by:  predniSONE 5 MG tablet      TAKE these medications        albuterol (2.5 MG/3ML) 0.083% nebulizer solution  Commonly known as:  PROVENTIL  Take 6 mLs (5 mg total) by nebulization every 6 (six) hours as needed for wheezing or shortness of breath.     amLODipine 5 MG tablet  Commonly known as:  NORVASC  Take 1 tablet (5 mg total) by mouth daily.     azithromycin 250 MG tablet  Commonly known as:  ZITHROMAX  Take 1 tablet (250 mg total) by mouth daily.     benzonatate 100 MG capsule  Commonly known as:  TESSALON  Take 1 capsule (100 mg total) by mouth 3 (three) times daily as needed for cough.     cefpodoxime 200 MG tablet  Commonly known as:  VANTIN  Take 1 tablet (200 mg total) by mouth 2 (two) times daily.     DULoxetine 60 MG capsule  Commonly known as:  CYMBALTA  Take 60 mg by mouth 2 (two) times daily.  Fluticasone-Salmeterol 250-50 MCG/DOSE Aepb  Commonly known as:  ADVAIR DISKUS  Inhale 1 puff into the lungs 2 (two) times daily.     gabapentin 800 MG tablet  Commonly known as:  NEURONTIN  Take 800 mg by mouth 3 (three) times daily.     methadone 10 MG/ML solution  Commonly known as:  DOLOPHINE  Take 145 mg by mouth daily.     Nebulizer [employer] for home use.     predniSONE 5 MG  tablet  Commonly known as:  DELTASONE  Label  & dispense according to the schedule below. 10 Pills PO for 3 days then, 8 Pills PO for 3 days, 6 Pills PO for 3 days, 4 Pills PO for 3 days, 2 Pills PO for 3 days, 1 Pills PO for 3 days, 1/2 Pill  PO for 3 days then STOP. Total 95 pills.     tiotropium 18 MCG inhalation capsule  Commonly known as:  SPIRIVA HANDIHALER  Place 1 capsule (18 mcg total) into inhaler and inhale daily.     tiZANidine 4 MG tablet  Commonly known as:  ZANAFLEX  Take 4 mg by mouth 2 (two) times daily.        Major procedures and Radiology Reports - PLEASE review detailed and final reports for all details, in brief -       Dg Chest 2 View  03/07/2016  CLINICAL DATA:  Increasing shortness of breath for 2 days. Chest pain. History of COPD. EXAM: CHEST  2 VIEW COMPARISON:  08/17/2015 FINDINGS: The cardiomediastinal silhouette is unchanged and within normal limits. The lungs are hyperinflated with severe bullous change again seen in the right upper lobe. No definite pneumothorax is identified. Bilateral lower lobe predominant interstitial densities are chronic and unchanged. No definite acute airspace consolidation, overt pulmonary edema, or pleural effusion is identified. Prior ACDF. IMPRESSION: Chronic lung disease including bullous emphysema. No definite acute abnormality identified. Electronically Signed   By: Logan Bores M.D.   On: 03/07/2016 16:22   Dg Chest Port 1 View  03/09/2016  CLINICAL DATA:  Shortness of breath, history of pneumonia in COPD EXAM: PORTABLE CHEST 1 VIEW COMPARISON:  03/07/2016 FINDINGS: Severe upper lobe emphysema with large bull a over the right upper lobe. This is stable on studies dating back to 03/16/2015. Interstitial opacities in the mid to lower lung zones consistent with fibrosis also stable along with mild bronchiectasis. More focal opacity left lower lobe is new from 2016 and mildly increased when compared to most recent prior study  03/07/2016. IMPRESSION: Findings of severe COPD. Cannot exclude superimposed developing left lower lobe pneumonia. Electronically Signed   By: Skipper Cliche M.D.   On: 03/09/2016 07:57   US Abdomen Complete W/elastography  02/16/2016  CLINICAL DATA:  Hepatitis C, diagnosed 4-5 years ago EXAM: ULTRASOUND ABDOMEN COMPLETE ULTRASOUND HEPATIC ELASTOGRAPHY TECHNIQUE: Sonography of the upper abdomen was performed. In addition, ultrasound elastography evaluation of the liver was performed. A region of interest was placed within the right lobe of the liver. Following application of a compressive sonographic pulse, shear waves were detected in the adjacent hepatic tissue and the shear wave velocity was calculated. Multiple assessments were performed at the selected site. Median shear wave velocity is correlated to a Metavir fibrosis score. COMPARISON:  None. FINDINGS: ULTRASOUND ABDOMEN Gallbladder: No gallstones, gallbladder wall thickening, or pericholecystic fluid. Negative sonographic Murphy's sign. Common bile duct: Diameter: 7 mm Liver: No focal lesion identified. Within normal limits in parenchymal echogenicity. IVC:  No abnormality visualized. Pancreas: Visualized portion unremarkable. Spleen: Size and appearance within normal limits. Right Kidney: Length: 12.0 cm. 6 x 6 x 7 mm interpolar cyst. No hydronephrosis. Left Kidney: Length: 11.1 cm. Multiple cysts, measuring up to 2.1 x 2.3 x 1.9 cm in the lower pole. No hydronephrosis. Abdominal aorta: No aneurysm visualized.  Atherosclerosis. Other findings: None. ULTRASOUND HEPATIC ELASTOGRAPHY Device: Siemens Helix VTQ Patient position:  Left Lateral Decubitus Transducer 6C1 Number of measurements:  10 Hepatic Segment:  8 Median velocity:   1.31  m/sec IQR: 0.23 IQR/Median velocity ratio 0.175 Corresponding Metavir fibrosis score:  F2 + some F3 Risk of fibrosis: Moderate Limitations of exam: None Pertinent findings noted on other imaging exams:  None Please note  that abnormal shear wave velocities may also be identified in clinical settings other than with hepatic fibrosis, such as: acute hepatitis, elevated right heart and central venous pressures including use of beta blockers, veno-occlusive disease (Budd-Chiari), infiltrative processes such as mastocytosis/amyloidosis/infiltrative tumor, extrahepatic cholestasis, in the post-prandial state, and liver transplantation. Correlation with patient history, laboratory data, and clinical condition recommended. IMPRESSION: Bilateral renal cysts, measuring up to 2.3 cm in the left lower pole, predominantly simple. No hydronephrosis. Median hepatic shear wave velocity is calculated at 1.31 m/sec. Corresponding Metavir fibrosis score is F2 + some F3. Risk of fibrosis is moderate. Follow-up:  Additional testing appropriate Electronically Signed   By: Julian Hy M.D.   On: 02/16/2016 08:12    Micro Results      Recent Results (from the past 240 hour(s))  Blood Culture (routine x 2)     Status: None (Preliminary result)   Collection Time: 03/07/16  4:36 PM  Result Value Ref Range Status   Specimen Description BLOOD LEFT HAND  Final   Special Requests BOTTLES DRAWN AEROBIC AND ANAEROBIC 5CC  Final   Culture   Final    NO GROWTH < 24 HOURS Performed at [facility]    Report Status PENDING  Incomplete  Blood Culture (routine x 2)     Status: None (Preliminary result)   Collection Time: 03/07/16  4:38 PM  Result Value Ref Range Status   Specimen Description BLOOD RIGHT WRIST  Final   Special Requests BOTTLES DRAWN AEROBIC AND ANAEROBIC 5CC  Final   Culture   Final    NO GROWTH < 24 HOURS Performed at [facility]    Report Status PENDING  Incomplete  Urine culture     Status: Abnormal   Collection Time: 03/07/16  5:07 PM  Result Value Ref Range Status   Specimen Description URINE, RANDOM  Final   Special Requests NONE  Final   Culture (A)  Final    6,000 COLONIES/mL INSIGNIFICANT  GROWTH Performed at [facility]    Report Status 03/08/2016 FINAL  Final    Today   Subjective    Alora Bitton today has no headache,no chest abdominal pain,no new weakness tingling or numbness, feels much better wants to go home today.    Objective   Blood pressure 140/95, pulse 85, temperature 98.3 F (36.8 C), temperature source Oral, resp. rate 18, height '5\' 6"'$  (1.676 m), weight 58.287 kg (128 lb 8 oz), SpO2 98 %.   Intake/Output Summary (Last 24 hours) at 03/09/16 0851 Last data filed at 03/09/16 0202  Gross per 24 hour  Intake    290 ml  Output    700 ml  Net   -410 ml    Exam Awake Alert,  Oriented x 3, No new F.N deficits, Normal affect [place].AT,PERRAL Supple Neck,No JVD, No cervical lymphadenopathy appriciated.  Symmetrical Chest wall movement, Good air movement bilaterally, few rales RRR,No Gallops,Rubs or new Murmurs, No Parasternal Heave +ve B.Sounds, Abd Soft, Non tender, No organomegaly appriciated, No rebound -guarding or rigidity. No Cyanosis, Clubbing or edema, No new Rash or bruise   Data Review   CBC w Diff: Lab Results  Component Value Date   WBC 18.4* 03/09/2016   HGB 12.7 03/09/2016   HCT 39.4 03/09/2016   PLT 261 03/09/2016   LYMPHOPCT PENDING 03/07/2016   BANDSPCT PENDING 03/07/2016   MONOPCT PENDING 03/07/2016   EOSPCT PENDING 03/07/2016   BASOPCT PENDING 03/07/2016    CMP: Lab Results  Component Value Date   NA 141 03/09/2016   K 4.9 03/09/2016   CL 108 03/09/2016   CO2 23 03/09/2016   BUN 12 03/09/2016   CREATININE 0.92 03/09/2016   PROT 6.9 08/17/2015   ALBUMIN 3.5 08/17/2015   BILITOT 0.6 08/17/2015   ALKPHOS 137* 08/17/2015   AST 49* 08/17/2015   ALT 31 08/17/2015  .   Total Time in preparing paper work, data evaluation and todays exam - 44 minutes  Grayland Ormond M.D on 03/09/2016 at 8:51 AM   I have directly reviewed the clinical findings, lab results and imaging studies. I have interviewed and examined the  patient and agree with the documentation and management as recorded by the NP student.  Patient was admitted for community-acquired pneumonia along with mild COPD exacerbation, she is back to her baseline after few days of IV antibiotics and IV steroids. She was counseled to quit smoking. Off note she is on very high doses of methadone will request PCP and the M.D. [facility] to continue tapering Methadose does down.  Thurnell Lose M.D on 03/09/2016 at 10:37 AM  [facility] Office  [phone]

## 2016-03-09 NOTE — Care Management Note (Signed)
Case Management Note  Patient Details  Name: Emily Moran MRN: [id number] Date of Birth: 18-Mar-1956  Subjective/Objective:  60 y/o f admitted w/PNA. Has home 02 by [facility] only HS-has no travel tank.. If home 02 needed more often, then will need 02 sats checked & documented in progress note to confirm if qualifies, & ordered to have travel tank brought to rm by [facility]. Nsg notified.                  Action/Plan:d/c plan home.   Expected Discharge Date:                 Expected Discharge Plan:  Home/Self Care  In-House Referral:     Discharge planning Services  CM Consult  Post Acute Care Choice:  Durable Medical Equipment [facility] home 02 @ HS.) Choice offered to:     DME Arranged:    DME Agency:     HH Arranged:    [place] Agency:     Status of Service:  Completed, signed off  Medicare Important Message Given:    Date Medicare IM Given:    Medicare IM give by:    Date Additional Medicare IM Given:    Additional Medicare Important Message give by:     If discussed at [place] of Stay Meetings, dates discussed:    Additional Comments:  Dessa Phi, RN 03/09/2016, 9:26 AM

## 2016-03-12 LAB — CULTURE, BLOOD (ROUTINE X 2)
Culture: NO GROWTH
Culture: NO GROWTH

## 2016-04-05 ENCOUNTER — Encounter (HOSPITAL_COMMUNITY): Payer: Self-pay

## 2016-04-05 ENCOUNTER — Inpatient Hospital Stay (HOSPITAL_COMMUNITY)
Admission: EM | Admit: 2016-04-05 | Discharge: 2016-04-08 | DRG: 871 | Disposition: A | Discharge status: Home / Self Care | Payer: Medicare Other | Attending: Internal Medicine | Admitting: Internal Medicine

## 2016-04-05 ENCOUNTER — Emergency Department (HOSPITAL_COMMUNITY): Payer: Medicare Other

## 2016-04-05 DIAGNOSIS — Z886 Allergy status to analgesic agent status: Secondary | ICD-10-CM | POA: Diagnosis not present

## 2016-04-05 DIAGNOSIS — Y95 Nosocomial condition: Secondary | ICD-10-CM | POA: Diagnosis present

## 2016-04-05 DIAGNOSIS — R6521 Severe sepsis with septic shock: Secondary | ICD-10-CM | POA: Diagnosis present

## 2016-04-05 DIAGNOSIS — E876 Hypokalemia: Secondary | ICD-10-CM | POA: Diagnosis present

## 2016-04-05 DIAGNOSIS — Z90710 Acquired absence of both cervix and uterus: Secondary | ICD-10-CM

## 2016-04-05 DIAGNOSIS — J440 Chronic obstructive pulmonary disease with acute lower respiratory infection: Secondary | ICD-10-CM | POA: Diagnosis present

## 2016-04-05 DIAGNOSIS — E872 Acidosis: Secondary | ICD-10-CM | POA: Diagnosis present

## 2016-04-05 DIAGNOSIS — J449 Chronic obstructive pulmonary disease, unspecified: Secondary | ICD-10-CM | POA: Diagnosis not present

## 2016-04-05 DIAGNOSIS — A419 Sepsis, unspecified organism: Principal | ICD-10-CM | POA: Diagnosis present

## 2016-04-05 DIAGNOSIS — F329 Major depressive disorder, single episode, unspecified: Secondary | ICD-10-CM | POA: Diagnosis present

## 2016-04-05 DIAGNOSIS — B1920 Unspecified viral hepatitis C without hepatic coma: Secondary | ICD-10-CM | POA: Diagnosis present

## 2016-04-05 DIAGNOSIS — I10 Essential (primary) hypertension: Secondary | ICD-10-CM | POA: Diagnosis present

## 2016-04-05 DIAGNOSIS — N170 Acute kidney failure with tubular necrosis: Secondary | ICD-10-CM | POA: Diagnosis present

## 2016-04-05 DIAGNOSIS — Z79899 Other long term (current) drug therapy: Secondary | ICD-10-CM | POA: Diagnosis not present

## 2016-04-05 DIAGNOSIS — M542 Cervicalgia: Secondary | ICD-10-CM | POA: Diagnosis present

## 2016-04-05 DIAGNOSIS — F1210 Cannabis abuse, uncomplicated: Secondary | ICD-10-CM | POA: Diagnosis present

## 2016-04-05 DIAGNOSIS — Z9981 Dependence on supplemental oxygen: Secondary | ICD-10-CM | POA: Diagnosis not present

## 2016-04-05 DIAGNOSIS — B182 Chronic viral hepatitis C: Secondary | ICD-10-CM | POA: Diagnosis not present

## 2016-04-05 DIAGNOSIS — M545 Low back pain: Secondary | ICD-10-CM | POA: Diagnosis present

## 2016-04-05 DIAGNOSIS — F17200 Nicotine dependence, unspecified, uncomplicated: Secondary | ICD-10-CM | POA: Diagnosis present

## 2016-04-05 DIAGNOSIS — J189 Pneumonia, unspecified organism: Secondary | ICD-10-CM | POA: Diagnosis present

## 2016-04-05 DIAGNOSIS — F1110 Opioid abuse, uncomplicated: Secondary | ICD-10-CM | POA: Diagnosis present

## 2016-04-05 DIAGNOSIS — F17210 Nicotine dependence, cigarettes, uncomplicated: Secondary | ICD-10-CM | POA: Diagnosis present

## 2016-04-05 DIAGNOSIS — Z87442 Personal history of urinary calculi: Secondary | ICD-10-CM

## 2016-04-05 DIAGNOSIS — R0602 Shortness of breath: Secondary | ICD-10-CM | POA: Diagnosis not present

## 2016-04-05 DIAGNOSIS — Z79891 Long term (current) use of opiate analgesic: Secondary | ICD-10-CM | POA: Diagnosis not present

## 2016-04-05 DIAGNOSIS — E860 Dehydration: Secondary | ICD-10-CM | POA: Diagnosis present

## 2016-04-05 DIAGNOSIS — F419 Anxiety disorder, unspecified: Secondary | ICD-10-CM | POA: Diagnosis present

## 2016-04-05 DIAGNOSIS — Z8711 Personal history of peptic ulcer disease: Secondary | ICD-10-CM

## 2016-04-05 DIAGNOSIS — G8929 Other chronic pain: Secondary | ICD-10-CM | POA: Diagnosis present

## 2016-04-05 DIAGNOSIS — F1910 Other psychoactive substance abuse, uncomplicated: Secondary | ICD-10-CM | POA: Diagnosis present

## 2016-04-05 LAB — BASIC METABOLIC PANEL
ANION GAP: 12 (ref 5–15)
BUN: 35 mg/dL — AB (ref 6–20)
CHLORIDE: 100 mmol/L — AB (ref 101–111)
CO2: 21 mmol/L — ABNORMAL LOW (ref 22–32)
Calcium: 7.9 mg/dL — ABNORMAL LOW (ref 8.9–10.3)
Creatinine, Ser: 2.84 mg/dL — ABNORMAL HIGH (ref 0.44–1.00)
GFR calc Af Amer: 20 mL/min — ABNORMAL LOW (ref >60)
GFR calc non Af Amer: 17 mL/min — ABNORMAL LOW (ref >60)
Glucose, Bld: 98 mg/dL (ref 65–99)
POTASSIUM: 3.4 mmol/L — AB (ref 3.5–5.1)
SODIUM: 133 mmol/L — AB (ref 135–145)

## 2016-04-05 LAB — CBC WITH DIFFERENTIAL/PLATELET
BASOS ABS: 0.1 10*3/uL (ref 0.0–0.1)
BASOS PCT: 0 %
Basophils Absolute: 0.1 10*3/uL (ref 0.0–0.1)
Basophils Relative: 1 %
EOS ABS: 0.3 10*3/uL (ref 0.0–0.7)
EOS ABS: 0.3 10*3/uL (ref 0.0–0.7)
Eosinophils Relative: 2 %
Eosinophils Relative: 2 %
HCT: 36.9 % (ref 36.0–46.0)
HEMATOCRIT: 30.9 % — AB (ref 36.0–46.0)
HEMOGLOBIN: 12.5 g/dL (ref 12.0–15.0)
HEMOGLOBIN: 10.5 g/dL — AB (ref 12.0–15.0)
LYMPHS ABS: 4.1 10*3/uL — AB (ref 0.7–4.0)
LYMPHS PCT: 28 %
Lymphocytes Relative: 30 %
Lymphs Abs: 3.9 10*3/uL (ref 0.7–4.0)
MCH: 30.5 pg (ref 26.0–34.0)
MCH: 30.8 pg (ref 26.0–34.0)
MCHC: 33.9 g/dL (ref 30.0–36.0)
MCHC: 34 g/dL (ref 30.0–36.0)
MCV: 89.8 fL (ref 78.0–100.0)
MCV: 90.9 fL (ref 78.0–100.0)
MONOS PCT: 8 %
Monocytes Absolute: 1 10*3/uL (ref 0.1–1.0)
Monocytes Absolute: 1.1 10*3/uL — ABNORMAL HIGH (ref 0.1–1.0)
Monocytes Relative: 7 %
NEUTROS ABS: 7.7 10*3/uL (ref 1.7–7.7)
NEUTROS PCT: 62 %
NEUTROS PCT: 60 %
Neutro Abs: 9.2 10*3/uL — ABNORMAL HIGH (ref 1.7–7.7)
Platelets: 170 10*3/uL (ref 150–400)
Platelets: 225 10*3/uL (ref 150–400)
RBC: 4.06 MIL/uL (ref 3.87–5.11)
RBC: 3.44 MIL/uL — AB (ref 3.87–5.11)
RDW: 14.6 % (ref 11.5–15.5)
RDW: 14.6 % (ref 11.5–15.5)
WBC: 14.7 10*3/uL — ABNORMAL HIGH (ref 4.0–10.5)
WBC: 12.9 10*3/uL — AB (ref 4.0–10.5)

## 2016-04-05 LAB — I-STAT CHEM 8, ED
BUN: 38 mg/dL — ABNORMAL HIGH (ref 6–20)
CREATININE: 2.8 mg/dL — AB (ref 0.44–1.00)
Calcium, Ion: 0.95 mmol/L — ABNORMAL LOW (ref 1.13–1.30)
Chloride: 100 mmol/L — ABNORMAL LOW (ref 101–111)
Glucose, Bld: 92 mg/dL (ref 65–99)
HEMATOCRIT: 39 % (ref 36.0–46.0)
HEMOGLOBIN: 13.3 g/dL (ref 12.0–15.0)
POTASSIUM: 3.5 mmol/L (ref 3.5–5.1)
SODIUM: 134 mmol/L — AB (ref 135–145)
TCO2: 23 mmol/L (ref 0–100)

## 2016-04-05 LAB — ETHANOL

## 2016-04-05 LAB — URINALYSIS, ROUTINE W REFLEX MICROSCOPIC
GLUCOSE, UA: NEGATIVE mg/dL
Ketones, ur: NEGATIVE mg/dL
NITRITE: NEGATIVE
PROTEIN: 100 mg/dL — AB
Specific Gravity, Urine: 1.021 (ref 1.005–1.030)
pH: 5.5 (ref 5.0–8.0)

## 2016-04-05 LAB — URINE MICROSCOPIC-ADD ON

## 2016-04-05 LAB — RAPID URINE DRUG SCREEN, HOSP PERFORMED
Amphetamines: NOT DETECTED
BENZODIAZEPINES: NOT DETECTED
Barbiturates: NOT DETECTED
COCAINE: NOT DETECTED
OPIATES: NOT DETECTED
Tetrahydrocannabinol: POSITIVE — AB

## 2016-04-05 LAB — I-STAT CG4 LACTIC ACID, ED: LACTIC ACID, VENOUS: 1.96 mmol/L — AB (ref 0.5–1.9)

## 2016-04-05 MED ORDER — METHADONE HCL 10 MG/ML PO CONC
110.0000 mg | Freq: Every day | ORAL | Status: DC
  Filled 2016-04-05 (×2): qty 11

## 2016-04-05 MED ORDER — HYDROCORTISONE NA SUCCINATE PF 100 MG IJ SOLR
50.0000 mg | Freq: Once | INTRAMUSCULAR | Status: AC
  Administered 2016-04-06: 50 mg via INTRAVENOUS
  Filled 2016-04-05: qty 2

## 2016-04-05 MED ORDER — ALBUTEROL SULFATE (2.5 MG/3ML) 0.083% IN NEBU: 5.0000 mg | INHALATION_SOLUTION | RESPIRATORY_TRACT | Status: DC | PRN

## 2016-04-05 MED ORDER — GABAPENTIN 400 MG PO CAPS
800.0000 mg | ORAL_CAPSULE | Freq: Three times a day (TID) | ORAL | Status: DC
  Administered 2016-04-06 – 2016-04-07 (×5): 800 mg via ORAL
  Filled 2016-04-05 (×5): qty 2

## 2016-04-05 MED ORDER — DULOXETINE HCL 30 MG PO CPEP
60.0000 mg | ORAL_CAPSULE | Freq: Two times a day (BID) | ORAL | Status: DC
  Administered 2016-04-06 – 2016-04-07 (×5): 60 mg via ORAL
  Filled 2016-04-05 (×5): qty 2

## 2016-04-05 MED ORDER — SODIUM CHLORIDE 0.9 % IV BOLUS (SEPSIS)
1000.0000 mL | Freq: Once | INTRAVENOUS | Status: AC
  Administered 2016-04-05: 1000 mL via INTRAVENOUS

## 2016-04-05 MED ORDER — DM-GUAIFENESIN ER 30-600 MG PO TB12
1.0000 | ORAL_TABLET | Freq: Two times a day (BID) | ORAL | Status: DC
  Administered 2016-04-06 – 2016-04-07 (×5): 1 via ORAL
  Filled 2016-04-05 (×5): qty 1

## 2016-04-05 MED ORDER — POTASSIUM CHLORIDE 20 MEQ/15ML (10%) PO SOLN
20.0000 meq | Freq: Once | ORAL | Status: AC
Start: 2016-04-05 — End: 2016-04-06
  Administered 2016-04-06: 20 meq via ORAL
  Filled 2016-04-05: qty 15

## 2016-04-05 MED ORDER — ENOXAPARIN SODIUM 40 MG/0.4ML ~~LOC~~ SOLN
40.0000 mg | SUBCUTANEOUS | Status: DC
  Administered 2016-04-06 – 2016-04-07 (×2): 40 mg via SUBCUTANEOUS
  Filled 2016-04-05 (×2): qty 0.4

## 2016-04-05 MED ORDER — SODIUM CHLORIDE 0.9 % IV BOLUS (SEPSIS)
1000.0000 mL | Freq: Once | INTRAVENOUS | Status: AC
  Administered 2016-04-06: 1000 mL via INTRAVENOUS

## 2016-04-05 MED ORDER — PIPERACILLIN-TAZOBACTAM 3.375 G IVPB
3.3750 g | Freq: Once | INTRAVENOUS | Status: AC
  Administered 2016-04-05: 3.375 g via INTRAVENOUS
  Filled 2016-04-05: qty 50

## 2016-04-05 MED ORDER — NICOTINE 21 MG/24HR TD PT24: 21.0000 mg | MEDICATED_PATCH | Freq: Every day | TRANSDERMAL | Status: DC

## 2016-04-05 MED ORDER — SOFOSBUVIR-VELPATASVIR 400-100 MG PO TABS
1.0000 | ORAL_TABLET | Freq: Every day | ORAL | Status: DC
  Administered 2016-04-06 – 2016-04-07 (×2): 1 via ORAL

## 2016-04-05 MED ORDER — PIPERACILLIN-TAZOBACTAM 3.375 G IVPB
3.3750 g | Freq: Three times a day (TID) | INTRAVENOUS | Status: DC
  Administered 2016-04-06 – 2016-04-07 (×4): 3.375 g via INTRAVENOUS
  Filled 2016-04-05 (×4): qty 50

## 2016-04-05 MED ORDER — VANCOMYCIN HCL IN DEXTROSE 1-5 GM/200ML-% IV SOLN
1000.0000 mg | INTRAVENOUS | Status: AC
  Administered 2016-04-05: 1000 mg via INTRAVENOUS
  Filled 2016-04-05: qty 200

## 2016-04-05 MED ORDER — IPRATROPIUM BROMIDE 0.02 % IN SOLN: 0.5000 mg | RESPIRATORY_TRACT | Status: DC

## 2016-04-05 MED ORDER — SODIUM CHLORIDE 0.9 % IV SOLN
INTRAVENOUS | Status: DC
  Administered 2016-04-06 (×2): via INTRAVENOUS

## 2016-04-05 MED ORDER — VANCOMYCIN HCL 500 MG IV SOLR
500.0000 mg | INTRAVENOUS | Status: DC
  Filled 2016-04-05: qty 500

## 2016-04-05 NOTE — ED Notes (Addendum)
Per EMS- Pt c/o dizziness upon standing after having several falls with no LOC or head injury. Pt was at methadone clinic this morning. BP originally found to be 50/ palpable. After 643m bolus, BP was 80/48. Hx of drug abuse.

## 2016-04-05 NOTE — H&P (Addendum)
History and Physical    Emily Moran HYW:[id number] DOB: 01/04/1956 DOA: 04/05/2016  Referring MD/NP/PA:   PCP: Philis Fendt, MD   Patient coming from:  The patient is coming from home.  At baseline, pt is independent for most of ADL.   Chief Complaint: dizziness, cough and SOB  HPI: Emily Moran is a 60 y.o. female with medical history significant of drug abuse on methadone, hypertension, COPD, depression, anxiety, tobacco abuse, stomach ulcer, HCV, DDD, chronic neck pain and back pain, who presents with dizziness, cough and shortness of breath.  Pt reports that she feels dizzy, particularly upon standing up. She had several falls, with no LOC or head injury. She has chronic neck pain which has not changed. No headache. Per report, pt was at methadone clinic this morning and was found to have SBP of 50. After 621m bolus, BP became 80/48. When I sam pt in ED, she is drowsy, but oriented x 3. She states that she has cough and SOB. She coughs up brownish colored sputum. She denies chest pain, fever or chills. Patient has nausea, but no vomiting, diarrhea or abdominal pain. Denies symptoms of UTI. No unilateral weakness. Of note, patient was recently hospitalized from 5/31 to 03/09/16, and was treated for CAP. She completed 5 day course of azithromycin and Vantin after discharged home.  ED Course: pt was found to have blood pressure 75/45 which improved to 109/79 after 2 L of normal sitting bolus. WBC 14.7, temperature normal, bradycardia, oxygen desaturated to 85% on room air, lactate 1.96, positive UDS for THC, urinalysis with trace amount of leukocytes, AKi with cre 2.84. CXR showed emphysematous changes; a nodular density projects over the left upper lobe, not seen on other recent priors. It is possible this is artifactual; known fibrotic changes in the lung bases. The opacity is a little more prominent and focal in the right base suggesting an acute on chronic process. Pt is admitted to  stepdown as inpatient for further evaluation treatment.  Review of Systems:   General: no fevers, chills, no changes in body weight, has poor appetite, has fatigue HEENT: no blurry vision, hearing changes or sore throat Pulm: has dyspnea, coughing, no wheezing CV: no chest pain, no palpitations Abd: has nausea, no vomiting, abdominal pain, diarrhea, constipation GU: no dysuria, burning on urination, increased urinary frequency, hematuria  Ext: no leg edema Neuro: no unilateral weakness, numbness, or tingling, no vision change or hearing loss. Has dizziness. Skin: no rash MSK: No muscle spasm, no deformity, no limitation of range of movement in spin Heme: No easy bruising.  Travel history: No recent long distant travel.  Allergy:  Allergies  Allergen Reactions  . Aspirin     Upset stomach    Past Medical History  Diagnosis Date  . COPD [place] disease[place]   . Spinal stenosis   . Hypertension   . On home O2     "~ 3L once/wk and prn" (03/16/2015)  . Tobacco use   . Chronic pain   . Pain management   . Kidney stone   . Family history of adverse reaction to anesthesia     "my mother had an allergic reaction when she had kidney removed back in the '60's or '70's""  . Pneumonia     "@ least once/yr for the past 8 yrs" (03/16/2015)  . [place]     "I basically keep it" (03/16/2015)  . Anemia     "as a  child"  . History of stomach ulcers   . Hepatitis C   . Headache     "monthly; need glasses; comes on when I'm stressed or get too tired" (03/16/2015)  . Degenerative disc disease   . DDD (degenerative disc disease), lumbar   . Arthritis     "shoulders; back" (03/16/2015)  . Chronic back pain   . Anxiety   . Depression     Past Surgical History  Procedure Laterality Date  . Anterior cervical decomp/discectomy fusion  2002  . Total abdominal hysterectomy  1998  . Incontinence surgery  1998  . Femur im nail Right 11/28/2012     Procedure: INTRAMEDULLARY (IM) RETROGRADE FEMORAL NAILING wants jackson table , c-arm and biomet ;  Surgeon: Mauri Pole, MD;  Location: [facility];  Service: Orthopedics;  Laterality: Right;  . Back surgery    . Fracture surgery      Social History:  reports that she has been smoking Cigarettes.  She has a 9 pack-year smoking history. She has never used smokeless tobacco. She reports that she uses illicit drugs. She reports that she does not drink alcohol.  Family History: No family history on file.   Prior to Admission medications   Medication Sig Start Date End Date Taking? Authorizing Provider  albuterol (PROVENTIL) (2.5 MG/3ML) 0.083% nebulizer solution Take 6 mLs (5 mg total) by nebulization every 6 (six) hours as needed for wheezing or shortness of breath. 08/17/15  Yes Larene Pickett, PA-C  DULoxetine (CYMBALTA) 60 MG capsule Take 60 mg by mouth 2 (two) times daily.   Yes Historical Provider, MD  EPCLUSA 400-100 MG TABS Take 1 tablet by mouth daily. 03/12/16  Yes Historical Provider, MD  gabapentin (NEURONTIN) 800 MG tablet Take 800 mg by mouth 3 (three) times daily.   Yes Historical Provider, MD  methadone (DOLOPHINE) 10 MG/ML solution Take 110 mg by mouth daily.    Yes Historical Provider, MD  Respiratory Therapy Supplies (NEBULIZER) DEVI Nebulizer for home use. 08/17/15  Yes Larene Pickett, PA-C  amLODipine (NORVASC) 5 MG tablet Take 1 tablet (5 mg total) by mouth daily. Patient not taking: Reported on 03/07/2016 03/19/15   Orson Eva, MD  azithromycin (ZITHROMAX) 250 MG tablet Take 1 tablet (250 mg total) by mouth daily. Patient not taking: Reported on 04/05/2016 03/09/16   Thurnell Lose, MD  benzonatate (TESSALON) 100 MG capsule Take 1 capsule (100 mg total) by mouth 3 (three) times daily as needed for cough. Patient not taking: Reported on 08/17/2015 03/19/15   Orson Eva, MD  cefpodoxime (VANTIN) 200 MG tablet Take 1 tablet (200 mg total) by mouth 2 (two) times daily. Patient not taking:  Reported on 04/05/2016 03/09/16   Thurnell Lose, MD  Fluticasone-Salmeterol (ADVAIR DISKUS) 250-50 MCG/DOSE AEPB Inhale 1 puff into the lungs 2 (two) times daily. Patient not taking: Reported on 03/07/2016 05/07/14   Kathie Dike, MD  predniSONE (DELTASONE) 5 MG tablet Label  & dispense according to the schedule below. 10 Pills PO for 3 days then, 8 Pills PO for 3 days, 6 Pills PO for 3 days, 4 Pills PO for 3 days, 2 Pills PO for 3 days, 1 Pills PO for 3 days, 1/2 Pill  PO for 3 days then STOP. Total 95 pills. Patient not taking: Reported on 04/05/2016 03/09/16   Thurnell Lose, MD  tiotropium (SPIRIVA HANDIHALER) 18 MCG inhalation capsule Place 1 capsule (18 mcg total) into inhaler and inhale daily. Patient not  taking: Reported on 04/05/2016 05/07/14   Kathie Dike, MD    Physical Exam: Filed Vitals:   04/05/16 2115 04/05/16 2201 04/05/16 2215 04/05/16 2300  BP: 77/52  109/79 97/68  Pulse: 60  60 62  Temp:      TempSrc:      Resp: '20  12 16  '$ Height:  '5\' 6"'$  (1.676 m)    Weight:  61.236 kg (135 lb)    SpO2: 90%  94% 94%   General: Not in acute distress HEENT:       Eyes: PERRL, EOMI, no scleral icterus.       ENT: No discharge from the ears and nose, no pharynx injection, no tonsillar enlargement.        Neck: No JVD, no bruit, no mass felt. Heme: No neck lymph node enlargement. Cardiac: S1/S2, RRR, No murmurs, No gallops or rubs. Pulm: has decreased air movement bilaterally. No rales, wheezing, rhonchi or rubs. Abd: Soft, nondistended, nontender, no rebound pain, no organomegaly, BS present. GU: No hematuria Ext: No pitting leg edema bilaterally. 2+DP/PT pulse bilaterally. Musculoskeletal: No joint deformities, No joint redness or warmth, no limitation of ROM in spin. Skin: No rashes.  Neuro: drowsy, but oriented X3, cranial nerves II-XII grossly intact, moves all extremities normally.  Psych: Patient is not psychotic, no suicidal or hemocidal ideation.  Labs on Admission: I have  personally reviewed following labs and imaging studies  CBC:  Recent Labs Lab 04/05/16 2028 04/05/16 2045  WBC 14.7*  --   NEUTROABS 9.2*  --   HGB 12.5 13.3  HCT 36.9 39.0  MCV 90.9  --   PLT 225  --    Basic Metabolic Panel:  Recent Labs Lab 04/05/16 2028 04/05/16 2045  NA 133* 134*  K 3.4* 3.5  CL 100* 100*  CO2 21*  --   GLUCOSE 98 92  BUN 35* 38*  CREATININE 2.84* 2.80*  CALCIUM 7.9*  --    GFR: Estimated Creatinine Clearance: 20.5 mL/min (by C-G formula based on Cr of 2.8). Liver Function Tests: No results for input(s): AST, ALT, ALKPHOS, BILITOT, PROT, ALBUMIN in the last 168 hours. No results for input(s): LIPASE, AMYLASE in the last 168 hours. No results for input(s): AMMONIA in the last 168 hours. Coagulation Profile: No results for input(s): INR, PROTIME in the last 168 hours. Cardiac Enzymes: No results for input(s): CKTOTAL, CKMB, CKMBINDEX, TROPONINI in the last 168 hours. BNP (last 3 results) No results for input(s): PROBNP in the last 8760 hours. HbA1C: No results for input(s): HGBA1C in the last 72 hours. CBG: No results for input(s): GLUCAP in the last 168 hours. Lipid Profile: No results for input(s): CHOL, HDL, LDLCALC, TRIG, CHOLHDL, LDLDIRECT in the last 72 hours. Thyroid Function Tests: No results for input(s): TSH, T4TOTAL, FREET4, T3FREE, THYROIDAB in the last 72 hours. Anemia Panel: No results for input(s): VITAMINB12, FOLATE, FERRITIN, TIBC, IRON, RETICCTPCT in the last 72 hours. Urine analysis:    Component Value Date/Time   COLORURINE AMBER* 04/05/2016 2127   APPEARANCEUR CLOUDY* 04/05/2016 2127   LABSPEC 1.021 04/05/2016 2127   PHURINE 5.5 04/05/2016 2127   GLUCOSEU NEGATIVE 04/05/2016 2127   HGBUR TRACE* 04/05/2016 2127   BILIRUBINUR SMALL* 04/05/2016 2127   KETONESUR NEGATIVE 04/05/2016 2127   PROTEINUR 100* 04/05/2016 2127   UROBILINOGEN 4.0* 06/03/2012 1648   NITRITE NEGATIVE 04/05/2016 2127   LEUKOCYTESUR TRACE*  04/05/2016 2127   Sepsis Labs: '@LABRCNTIP'$ (procalcitonin:4,lacticidven:4) )No results found for this or any previous visit (  from the past 240 hour(s)).   Radiological Exams on Admission: Dg Chest Port 1 View  04/05/2016  CLINICAL DATA:  Dizziness.  Loss of consciousness. EXAM: PORTABLE CHEST 1 VIEW COMPARISON:  March 09, 2016 FINDINGS: Emphysematous changes with a large bulla in the right upper lung are stable. There is a nodular density in the left upper lung measuring 9 mm. Increased interstitial markings in the bases are largely a due to the patient's known fibrosis reported on the previous study. No acute findings seen on the left. The opacity in the right base is a little more prominent in the interval and an acute on chronic process is not excluded. The cardiomediastinal silhouette is stable. IMPRESSION: 1. Emphysematous changes. 2. A nodular density projects over the left upper lobe, not seen on other recent priors. It is possible this is artifactual. Recommend short-term follow-up to ensure resolution. 3. Known fibrotic changes in the lung bases. The opacity is a little more prominent and focal in the right base suggesting an acute on chronic process. Electronically Signed   By: Dorise Bullion III M.D   On: 04/05/2016 21:02     EKG: Independently reviewed.  Not done in ED, will get one.   Assessment/Plan Principal Problem:   Septic shock (HCC) Active Problems:   Hepatitis C   TOBACCO ABUSE   Substance abuse   COPD (chronic obstructive pulmonary disease) (HCC)   HTN (hypertension)   HCAP (healthcare-associated pneumonia)  Septic shock [facility]): Patient has hypotension and leukocytosis, consistent with septic shock. This is likely due to possible HCAP given her productive cough, shortness of breath plus chest x-ray findings. Her blood pressure has responded to the IV fluid treatment, currently blood pressure is 109/79. She is drowsy, but is oriented 3.  - Will admit to SDU as inpt - IV  Vancomycin and Zosyn - Mucinex for cough  - prn Albuterol Nebs, DuoNeb for SOB - Urine legionella and S. pneumococcal antigen - Follow up blood culture x2, sputum culture and respiratory virus panel - will get Procalcitonin and trend lactic acid level per sepsis protocol - IVF: 3L of NS bolus in ED, followed by 100 mL per hour of NS  - Give Solu Cortef 50 mg 1 and stress dose - Check cortisol level  COPD (chronic obstructive pulmonary disease) ([place]): has decreased and her movement, but no wheezing or rhonchi on auscultation. -see above  HCV: On EPCLUSA 400-100 MG tab -will continue home meds -check LFT  Substance abuse: including tobacco abuse and marijuana: UDS positive for THC today. -Did counseling about importance of quitting substance -Nicotine patch -on methadone  HTN: now has hypotension -Hold amlodipine  AKI: Likely due to ATN 2/2 hypotension and possible prerenal secondary to dehydration - IVF as above - Check FeNa - Follow up renal function by BMP  Dizziness and falls: Patient strongly denies head injury and neck injury. She has chronic neck pain, which has not changed. The dizziness is most likely due to hypotension. -IV fluid as above -PT/OT   Sepsis -Assessment  Performed at:   10:40 PM  Vitals     Blood pressure 109/79, pulse 60, temperature 98.2 F (36.8 C), temperature source Oral, resp. rate 12, height '5\' 6"'$  (1.676 m), weight 61.236 kg (135 lb), SpO2 94 %.  Heart:     Regular rate and rhythm  Lungs:    Decreased air movement bilaterally, no wheezing or rhonchi or rales.  Capillary Refill:   <2 seconds (toes)  Peripheral Pulse:  Radial pulse palpable  Skin:     Pale   DVT ppx: SQ Lovenox Code Status: Full code Family Communication: None at bed side. Disposition Plan:  Anticipate discharge back to previous home environment Consults called:  none Admission status:  SDU/inpation       Date of Service 04/05/2016    Ivor [facility] Pager [phone]  If 7PM-7AM, please contact night-coverage [email or web address] Password [email or web address] 04/05/2016, 11:14 PM

## 2016-04-05 NOTE — ED Notes (Signed)
Bed: RESB Expected date:  Expected time:  Means of arrival:  Comments: EMS-Hypotensive

## 2016-04-05 NOTE — Progress Notes (Signed)
Pharmacy Antibiotic Note  Emily Moran is a 60 y.o. female admitted on 04/05/2016 with pneumonia.  Pharmacy has been consulted for vancomycin dosing. Pt had recent admission for CAP from 5/31 to 6/2. No MD note yet.   Plan: Vancomycin 1000 mg IV x1, then vancomycin 500 mg IV q24h.   Height: '5\' 6"'$  (167.6 cm) Weight: 135 lb (61.236 kg) IBW/kg (Calculated) : 59.3  Temp (24hrs), Avg:98.2 F (36.8 C), Min:98.2 F (36.8 C), Max:98.2 F (36.8 C)   Recent Labs Lab 04/05/16 2028 04/05/16 2045  WBC 14.7*  --   CREATININE 2.84* 2.80*  LATICACIDVEN  --  1.96*    Estimated Creatinine Clearance: 20.5 mL/min (by C-G formula based on Cr of 2.8).    Allergies  Allergen Reactions  . Aspirin     Upset stomach    Antimicrobials this admission: Vancomycin 6/29 >>    Dose adjustments this admission:   Microbiology results:    Thank you for allowing pharmacy to be a part of this patient's care.  Royetta Asal, PharmD, BCPS Pager [phone] 04/05/2016 10:14 PM

## 2016-04-06 ENCOUNTER — Inpatient Hospital Stay (HOSPITAL_COMMUNITY): Payer: Medicare Other

## 2016-04-06 DIAGNOSIS — B182 Chronic viral hepatitis C: Secondary | ICD-10-CM

## 2016-04-06 DIAGNOSIS — J189 Pneumonia, unspecified organism: Secondary | ICD-10-CM

## 2016-04-06 DIAGNOSIS — R6521 Severe sepsis with septic shock: Secondary | ICD-10-CM

## 2016-04-06 DIAGNOSIS — F1910 Other psychoactive substance abuse, uncomplicated: Secondary | ICD-10-CM

## 2016-04-06 DIAGNOSIS — J449 Chronic obstructive pulmonary disease, unspecified: Secondary | ICD-10-CM

## 2016-04-06 DIAGNOSIS — A419 Sepsis, unspecified organism: Principal | ICD-10-CM

## 2016-04-06 LAB — MRSA PCR SCREENING: MRSA BY PCR: POSITIVE — AB

## 2016-04-06 LAB — CBC
HCT: 36.8 % (ref 36.0–46.0)
Hemoglobin: 12.3 g/dL (ref 12.0–15.0)
MCH: 29.9 pg (ref 26.0–34.0)
MCHC: 33.4 g/dL (ref 30.0–36.0)
MCV: 89.5 fL (ref 78.0–100.0)
PLATELETS: 230 10*3/uL (ref 150–400)
RBC: 4.11 MIL/uL (ref 3.87–5.11)
RDW: 14.3 % (ref 11.5–15.5)
WBC: 13.1 10*3/uL — AB (ref 4.0–10.5)

## 2016-04-06 LAB — LACTIC ACID, PLASMA
LACTIC ACID, VENOUS: 1.1 mmol/L (ref 0.5–1.9)
Lactic Acid, Venous: 1.2 mmol/L (ref 0.5–1.9)

## 2016-04-06 LAB — HIV ANTIBODY (ROUTINE TESTING W REFLEX): HIV Screen 4th Generation wRfx: NONREACTIVE

## 2016-04-06 LAB — BASIC METABOLIC PANEL
Anion gap: 11 (ref 5–15)
BUN: 26 mg/dL — AB (ref 6–20)
CALCIUM: 8.3 mg/dL — AB (ref 8.9–10.3)
CO2: 19 mmol/L — ABNORMAL LOW (ref 22–32)
Chloride: 104 mmol/L (ref 101–111)
Creatinine, Ser: 1.83 mg/dL — ABNORMAL HIGH (ref 0.44–1.00)
GFR calc Af Amer: 34 mL/min — ABNORMAL LOW (ref >60)
GFR, EST NON AFRICAN AMERICAN: 29 mL/min — AB (ref >60)
GLUCOSE: 163 mg/dL — AB (ref 65–99)
Potassium: 3.2 mmol/L — ABNORMAL LOW (ref 3.5–5.1)
SODIUM: 134 mmol/L — AB (ref 135–145)

## 2016-04-06 LAB — COMPREHENSIVE METABOLIC PANEL
ALK PHOS: 89 U/L (ref 38–126)
ALT: 11 U/L — AB (ref 14–54)
ANION GAP: 5 (ref 5–15)
AST: 22 U/L (ref 15–41)
Albumin: 2.7 g/dL — ABNORMAL LOW (ref 3.5–5.0)
BILIRUBIN TOTAL: 0.6 mg/dL (ref 0.3–1.2)
BUN: 31 mg/dL — ABNORMAL HIGH (ref 6–20)
CALCIUM: 6.5 mg/dL — AB (ref 8.9–10.3)
CO2: 21 mmol/L — AB (ref 22–32)
CREATININE: 2.41 mg/dL — AB (ref 0.44–1.00)
Chloride: 110 mmol/L (ref 101–111)
GFR, EST AFRICAN AMERICAN: 24 mL/min — AB (ref >60)
GFR, EST NON AFRICAN AMERICAN: 21 mL/min — AB (ref >60)
Glucose, Bld: 93 mg/dL (ref 65–99)
Potassium: 3.1 mmol/L — ABNORMAL LOW (ref 3.5–5.1)
Sodium: 136 mmol/L (ref 135–145)
TOTAL PROTEIN: 4.9 g/dL — AB (ref 6.5–8.1)

## 2016-04-06 LAB — PROTIME-INR
INR: 1.33 (ref 0.00–1.49)
Prothrombin Time: 16.1 seconds — ABNORMAL HIGH (ref 11.6–15.2)

## 2016-04-06 LAB — RESPIRATORY PANEL BY PCR
Adenovirus: NOT DETECTED
Bordetella pertussis: NOT DETECTED
CORONAVIRUS 229E-RVPPCR: NOT DETECTED
CORONAVIRUS HKU1-RVPPCR: NOT DETECTED
CORONAVIRUS NL63-RVPPCR: NOT DETECTED
CORONAVIRUS OC43-RVPPCR: NOT DETECTED
Chlamydophila pneumoniae: NOT DETECTED
INFLUENZA A H1 2009-RVPPR: NOT DETECTED
INFLUENZA A H3-RVPPCR: NOT DETECTED
INFLUENZA B-RVPPCR: NOT DETECTED
Influenza A H1: NOT DETECTED
Influenza A: NOT DETECTED
MYCOPLASMA PNEUMONIAE-RVPPCR: NOT DETECTED
Metapneumovirus: NOT DETECTED
PARAINFLUENZA VIRUS 1-RVPPCR: NOT DETECTED
Parainfluenza Virus 2: NOT DETECTED
Parainfluenza Virus 3: NOT DETECTED
Parainfluenza Virus 4: NOT DETECTED
Respiratory Syncytial Virus: NOT DETECTED
Rhinovirus / Enterovirus: NOT DETECTED

## 2016-04-06 LAB — MAGNESIUM
MAGNESIUM: 2 mg/dL (ref 1.7–2.4)
Magnesium: 1.6 mg/dL — ABNORMAL LOW (ref 1.7–2.4)

## 2016-04-06 LAB — STREP PNEUMONIAE URINARY ANTIGEN: STREP PNEUMO URINARY ANTIGEN: NEGATIVE

## 2016-04-06 LAB — APTT: APTT: 27 s (ref 24–37)

## 2016-04-06 LAB — PROCALCITONIN: PROCALCITONIN: 0.12 ng/mL

## 2016-04-06 LAB — SODIUM, URINE, RANDOM: SODIUM UR: 59 mmol/L

## 2016-04-06 LAB — CREATININE, URINE, RANDOM: Creatinine, Urine: 33.33 mg/dL

## 2016-04-06 LAB — CORTISOL-AM, BLOOD: Cortisol - AM: 66.1 ug/dL — ABNORMAL HIGH (ref 6.7–22.6)

## 2016-04-06 LAB — BRAIN NATRIURETIC PEPTIDE: B Natriuretic Peptide: 62.6 pg/mL (ref 0.0–100.0)

## 2016-04-06 MED ORDER — MAGNESIUM SULFATE 2 GM/50ML IV SOLN
2.0000 g | Freq: Once | INTRAVENOUS | Status: AC
  Administered 2016-04-06: 2 g via INTRAVENOUS
  Filled 2016-04-06: qty 50

## 2016-04-06 MED ORDER — CETYLPYRIDINIUM CHLORIDE 0.05 % MT LIQD
7.0000 mL | Freq: Two times a day (BID) | OROMUCOSAL | Status: DC
  Administered 2016-04-06 – 2016-04-07 (×4): 7 mL via OROMUCOSAL

## 2016-04-06 MED ORDER — DOXYCYCLINE HYCLATE 100 MG PO TABS
100.0000 mg | ORAL_TABLET | Freq: Two times a day (BID) | ORAL | Status: DC
  Administered 2016-04-06 – 2016-04-07 (×4): 100 mg via ORAL
  Filled 2016-04-06 (×4): qty 1

## 2016-04-06 MED ORDER — IPRATROPIUM-ALBUTEROL 0.5-2.5 (3) MG/3ML IN SOLN: 3.0000 mL | Freq: Four times a day (QID) | RESPIRATORY_TRACT | Status: DC

## 2016-04-06 MED ORDER — VANCOMYCIN HCL IN DEXTROSE 1-5 GM/200ML-% IV SOLN
1000.0000 mg | INTRAVENOUS | Status: DC
  Administered 2016-04-06: 1000 mg via INTRAVENOUS
  Filled 2016-04-06: qty 200

## 2016-04-06 MED ORDER — NICOTINE 7 MG/24HR TD PT24
7.0000 mg | MEDICATED_PATCH | Freq: Every day | TRANSDERMAL | Status: DC
  Administered 2016-04-06 – 2016-04-07 (×2): 7 mg via TRANSDERMAL
  Filled 2016-04-06 (×3): qty 1

## 2016-04-06 MED ORDER — ONDANSETRON HCL 4 MG/2ML IJ SOLN: 4.0000 mg | Freq: Four times a day (QID) | INTRAMUSCULAR | Status: DC | PRN

## 2016-04-06 MED ORDER — METHADONE HCL 10 MG/ML PO CONC
110.0000 mg | Freq: Every day | ORAL | Status: DC
  Administered 2016-04-06 – 2016-04-08 (×3): 110 mg via ORAL
  Filled 2016-04-06 (×4): qty 11

## 2016-04-06 MED ORDER — IPRATROPIUM-ALBUTEROL 0.5-2.5 (3) MG/3ML IN SOLN
3.0000 mL | Freq: Three times a day (TID) | RESPIRATORY_TRACT | Status: DC
  Administered 2016-04-06 – 2016-04-08 (×5): 3 mL via RESPIRATORY_TRACT
  Filled 2016-04-06 (×5): qty 3

## 2016-04-06 MED ORDER — ONDANSETRON HCL 4 MG/2ML IJ SOLN
4.0000 mg | Freq: Four times a day (QID) | INTRAMUSCULAR | Status: DC | PRN
  Administered 2016-04-06: 4 mg via INTRAVENOUS
  Filled 2016-04-06: qty 2

## 2016-04-06 NOTE — Progress Notes (Signed)
PROGRESS NOTE    Emily Moran  SJG:[id number] DOB: July 26, 1956 DOA: 04/05/2016  PCP: Philis Fendt, MD   Brief Narrative:  60 y.o. female with medical history significant of drug abuse on methadone, hypertension, COPD, depression, anxiety, tobacco abuse, stomach ulcer, HCV, DDD, chronic neck pain and back pain. She began to have increase in cough and dyspnea about 2 days ago and then developed vomiting and abdominal pain that save evening which continued until the next day. She has started treatment for Hep C about 1 mo ago and tells me that the medication occasionally causes vomiting.   She was hypotensive with mild lactic acidosis. Found to have AKI.   Coughing up green mucous. No fevers and no chest pain. Feels better since being admitted. No vomiting today.   Subjective: See above.  Assessment & Plan:   Principal Problem:   Septic shock- possibly due to resp infection ? Pneumonia - was here in May for CAP - BP improved with fluids - strep pneumo negative - MRSA PCR + -  blood cultures pending - CXR cannot rule out a pneumonia as she has increased Insterstitial markings (fibrosis)  - add Doxy to Vanc and Zosyn, add Duonebs Q 6  Active Problems:  COPD - no wheezing at this time - on 2.5 L of O2 at baseline-   has a chronic cough with is worse for 2 days - Cont nebs-  Leukocytosis - WBC has gone up- received Solu-cortef in ER  Vomiting and abdominal pain  - resolved - full liquids for now    Hepatitis C - on Epclusa per GI- Dr Paulita Fujita    TOBACCO ABUSE - smokes 5 cigarettes / day- advised to stop    Substance abuse - marijuana + UDS- discussed with patient and advised to stop    HTN - hold Norvasc  Heroin abuse - this is why she was initiated on Methadone 2 yrs ago but she states she essentially needs it now for neck and lower back pain    DVT prophylaxis: Lovenox Code Status: full code Family Communication:  Disposition Plan: home in 2-3  days Consultants:   none Procedures:   none Antimicrobials:  Anti-infectives    Start     Dose/Rate Route Frequency Ordered Stop   04/06/16 2200  vancomycin (VANCOCIN) 500 mg in sodium chloride 0.9 % 100 mL IVPB     500 mg 100 mL/hr over 60 Minutes Intravenous Every 24 hours 04/05/16 2206     04/06/16 1000  Sofosbuvir-Velpatasvir 400-100 MG TABS 1 tablet     1 tablet Oral Daily 04/05/16 2253     04/06/16 0900  doxycycline (VIBRA-TABS) tablet 100 mg     100 mg Oral Every 12 hours 04/06/16 0849     04/06/16 0600  piperacillin-tazobactam (ZOSYN) IVPB 3.375 g     3.375 g 12.5 mL/hr over 240 Minutes Intravenous Every 8 hours 04/05/16 2253     04/05/16 2215  vancomycin (VANCOCIN) IVPB 1000 mg/200 mL premix     1,000 mg 200 mL/hr over 60 Minutes Intravenous STAT 04/05/16 2206 04/05/16 2319   04/05/16 2200  piperacillin-tazobactam (ZOSYN) IVPB 3.375 g     3.375 g 12.5 mL/hr over 240 Minutes Intravenous  Once 04/05/16 2145 04/06/16 0224       Objective: Filed Vitals:   04/06/16 0800 04/06/16 1000 04/06/16 1200 04/06/16 1300  BP: 151/94 129/65 126/88   Pulse: 56 82 84   Temp: 98.4 F (36.9 C)  TempSrc: Oral     Resp: '17 11 6 11  '$ Height:      Weight:      SpO2: 98% 94% 87% 91%    Intake/Output Summary (Last 24 hours) at 04/06/16 1320 Last data filed at 04/06/16 1300  Gross per 24 hour  Intake   1300 ml  Output   3250 ml  Net  -1950 ml   Filed Weights   04/05/16 2201 04/06/16 0429  Weight: 61.236 kg (135 lb) 61.9 kg (136 lb 7.4 oz)    Examination: General exam: Appears comfortable  HEENT: PERRLA, oral mucosa moist, no sclera icterus or thrush Respiratory system: Clear to auscultation but poor air entry  Respiratory effort normal. Cardiovascular system: S1 & S2 heard, RRR.  No murmurs  Gastrointestinal system: Abdomen soft, non-tender, nondistended. Normal bowel sound. No organomegaly Central nervous system: Alert and oriented. No focal neurological  deficits. Extremities: No cyanosis, clubbing or edema Skin: No rashes or ulcers Psychiatry:  Mood & affect appropriate.     Data Reviewed: I have personally reviewed following labs and imaging studies  CBC:  Recent Labs Lab 04/05/16 2028 04/05/16 2045 04/05/16 2337 04/06/16 0919  WBC 14.7*  --  12.9* 13.1*  NEUTROABS 9.2*  --  7.7  --   HGB 12.5 13.3 10.5* 12.3  HCT 36.9 39.0 30.9* 36.8  MCV 90.9  --  89.8 89.5  PLT 225  --  170 938   Basic Metabolic Panel:  Recent Labs Lab 04/05/16 2028 04/05/16 2045 04/05/16 2337 04/06/16 0208 04/06/16 0919  NA 133* 134* 136  --  134*  K 3.4* 3.5 3.1*  --  3.2*  CL 100* 100* 110  --  104  CO2 21*  --  21*  --  19*  GLUCOSE 98 92 93  --  163*  BUN 35* 38* 31*  --  26*  CREATININE 2.84* 2.80* 2.41*  --  1.83*  CALCIUM 7.9*  --  6.5*  --  8.3*  MG  --   --   --  1.6* 2.0   GFR: Estimated Creatinine Clearance: 31.4 mL/min (by C-G formula based on Cr of 1.83). Liver Function Tests:  Recent Labs Lab 04/05/16 2337  AST 22  ALT 11*  ALKPHOS 89  BILITOT 0.6  PROT 4.9*  ALBUMIN 2.7*   No results for input(s): LIPASE, AMYLASE in the last 168 hours. No results for input(s): AMMONIA in the last 168 hours. Coagulation Profile:  Recent Labs Lab 04/05/16 2337  INR 1.33   Cardiac Enzymes: No results for input(s): CKTOTAL, CKMB, CKMBINDEX, TROPONINI in the last 168 hours. BNP (last 3 results) No results for input(s): PROBNP in the last 8760 hours. HbA1C: No results for input(s): HGBA1C in the last 72 hours. CBG: No results for input(s): GLUCAP in the last 168 hours. Lipid Profile: No results for input(s): CHOL, HDL, LDLCALC, TRIG, CHOLHDL, LDLDIRECT in the last 72 hours. Thyroid Function Tests: No results for input(s): TSH, T4TOTAL, FREET4, T3FREE, THYROIDAB in the last 72 hours. Anemia Panel: No results for input(s): VITAMINB12, FOLATE, FERRITIN, TIBC, IRON, RETICCTPCT in the last 72 hours. Urine analysis:     Component Value Date/Time   COLORURINE AMBER* 04/05/2016 2127   APPEARANCEUR CLOUDY* 04/05/2016 2127   LABSPEC 1.021 04/05/2016 2127   PHURINE 5.5 04/05/2016 2127   GLUCOSEU NEGATIVE 04/05/2016 2127   HGBUR TRACE* 04/05/2016 2127   BILIRUBINUR SMALL* 04/05/2016 2127   [place] NEGATIVE 04/05/2016 2127   PROTEINUR 100* 04/05/2016 2127  UROBILINOGEN 4.0* 06/03/2012 1648   NITRITE NEGATIVE 04/05/2016 2127   LEUKOCYTESUR TRACE* 04/05/2016 2127   Sepsis Labs: '@LABRCNTIP'$ (procalcitonin:4,lacticidven:4) ) Recent Results (from the past 240 hour(s))  MRSA PCR Screening     Status: Abnormal   Collection Time: 04/06/16  1:00 AM  Result Value Ref Range Status   MRSA by PCR POSITIVE (A) NEGATIVE Final    Comment:        The GeneXpert MRSA Assay (FDA approved for NASAL specimens only), is one component of a comprehensive MRSA colonization surveillance program. It is not intended to diagnose MRSA infection nor to guide or monitor treatment for MRSA infections. RESULT CALLED TO, READ BACK BY AND VERIFIED WITH: N.PALANIVEL,RN 0251 04/06/16 [facility]          Radiology Studies: Dg Chest Port 1 View  04/06/2016  CLINICAL DATA:  Suspected pneumonia, history of COPD, current smoker, hepatitis-C. EXAM: PORTABLE CHEST 1 VIEW COMPARISON:  Portable chest x-ray of April 05, 2016 FINDINGS: Severe bullous change in the right upper lobe is again demonstrated. The interstitial markings in the right mid and lower lung remain increased. The interstitial markings on the left are diffusely increased. There is no alveolar infiltrate. There is no pleural effusion. The heart and pulmonary vascularity are normal. The observed bony thorax is unremarkable. IMPRESSION: Bullous emphysematous change greatest in the left upper lobe. Persistently increased interstitial markings diffusely consistent with pulmonary fibrotic change. One cannot exclude superimposed interstitial pneumonia. No alveolar pneumonia nor CHF.  Electronically Signed   By: David  Martinique M.D.   On: 04/06/2016 09:14   Dg Chest Port 1 View  04/05/2016  CLINICAL DATA:  Dizziness.  Loss of consciousness. EXAM: PORTABLE CHEST 1 VIEW COMPARISON:  March 09, 2016 FINDINGS: Emphysematous changes with a large bulla in the right upper lung are stable. There is a nodular density in the left upper lung measuring 9 mm. Increased interstitial markings in the bases are largely a due to the patient's known fibrosis reported on the previous study. No acute findings seen on the left. The opacity in the right base is a little more prominent in the interval and an acute on chronic process is not excluded. The cardiomediastinal silhouette is stable. IMPRESSION: 1. Emphysematous changes. 2. A nodular density projects over the left upper lobe, not seen on other recent priors. It is possible this is artifactual. Recommend short-term follow-up to ensure resolution. 3. Known fibrotic changes in the lung bases. The opacity is a little more prominent and focal in the right base suggesting an acute on chronic process. Electronically Signed   By: Dorise Bullion III M.D   On: 04/05/2016 21:02      Scheduled Meds: . antiseptic oral rinse  7 mL Mouth Rinse BID  . dextromethorphan-guaiFENesin  1 tablet Oral BID  . doxycycline  100 mg Oral Q12H  . DULoxetine  60 mg Oral BID  . enoxaparin (LOVENOX) injection  40 mg Subcutaneous Q24H  . gabapentin  800 mg Oral TID  . methadone  110 mg Oral Daily  . nicotine  7 mg Transdermal Daily  . piperacillin-tazobactam (ZOSYN)  IV  3.375 g Intravenous Q8H  . Sofosbuvir-Velpatasvir  1 tablet Oral Daily  . vancomycin  500 mg Intravenous Q24H   Continuous Infusions: . sodium chloride 100 mL/hr at 04/06/16 0130     LOS: 1 day    Time spent in minutes: 66    [place], MD [facility] Pager: [email or web address] Password [email or web address] 04/06/2016, 1:20 PM

## 2016-04-06 NOTE — Progress Notes (Signed)
Pharmacy Antibiotic Note  Emily Moran is a 60 y.o. female admitted on 04/05/2016 with septic shock possibly due to ? pneumonia.  Pharmacy has been consulted for vancomycin dosing. Pt had recent admission for CAP from 5/31 to 6/2. Patient also placed on Zosyn and Doxycycline per MD.  Plan: Adjust Vancomycin to 1g IV q24h for improved renal function. Plan for Vancomycin trough level at steady state. Goal trough level 15-20 mcg/mL. Continue to monitor renal function, cultures, clinical course.   Height: '5\' 6"'$  (167.6 cm) Weight: 136 lb 7.4 oz (61.9 kg) IBW/kg (Calculated) : 59.3  Temp (24hrs), Avg:98.5 F (36.9 C), Min:98.2 F (36.8 C), Max:99.1 F (37.3 C)   Recent Labs Lab 04/05/16 2028 04/05/16 2045 04/05/16 2337 04/06/16 0208 04/06/16 0919  WBC 14.7*  --  12.9*  --  13.1*  CREATININE 2.84* 2.80* 2.41*  --  1.83*  LATICACIDVEN  --  1.96* 1.2 1.1  --     Estimated Creatinine Clearance: 31.4 mL/min (by C-G formula based on Cr of 1.83).    Allergies  Allergen Reactions  . Aspirin     Upset stomach    Antimicrobials this admission: Vancomycin 6/29 >>  Zosyn 6/29 >>  Doxy 6/30 >>  Dose adjustments this admission: 6/30: Vancomycin dose adjusted to 1g q24h for improved renal function  Microbiology results: 6/29 BCx: sent 6/30 resp panel PCR: sent 6/30 MRSA PCR: + 6/30 strep pneumo ur ag: neg 6/30 legionella ur ag: IP   Thank you for allowing pharmacy to be a part of this patient's care.  Lindell Spar, PharmD, BCPS Pager: [phone] 04/06/2016 3:51 PM

## 2016-04-06 NOTE — Evaluation (Signed)
Physical Therapy Evaluation Patient Details Name: Emily Moran MRN: [id number] DOB: 11/24/1955 Today's Date: 04/06/2016   History of Present Illness  Pt presented with dizziness, cough, and SOB.  She was diagnosed with septic shock.  She has a PMH signficant for COPD, HTN, depression, anxiety, DDD, chronic back and neck pain and drug abuse  Clinical Impression  Pt admitted as above and presenting with functional mobility limitations 2* limited endurance, chronic back pain and balance deficits.  Pt should progress to dc home with family assist.    Follow Up Recommendations No PT follow up    Equipment Recommendations  Rolling walker with 5" wheels    Recommendations for Other Services OT consult     Precautions / Restrictions Precautions Precautions: Fall Precaution Comments: check sats Restrictions Weight Bearing Restrictions: No      Mobility  Bed Mobility Overal bed mobility: Independent             General bed mobility comments: assistance only for lines  Transfers Overall transfer level: Needs assistance Equipment used: None Transfers: Sit to/from Stand Sit to Stand: Min guard         General transfer comment: min guard for stability with initial standing  Ambulation/Gait Ambulation/Gait assistance: Min assist;Min guard Ambulation Distance (Feet): 150 Feet Assistive device: Rolling walker (2 wheeled);1 person hand held assist Gait Pattern/deviations: Step-through pattern;Decreased step length - right;Decreased step length - left;Shuffle;Trunk flexed;Wide base of support Gait velocity: decr Gait velocity interpretation: Below normal speed for age/gender General Gait Details: Pt ambulated 68' with HHA and additional 120' with RW with marked improvement in stability noted.  Stairs            Wheelchair Mobility    Modified Rankin (Stroke Patients Only)       Balance Overall balance assessment: Needs assistance Sitting-balance support: No  upper extremity supported;Feet supported Sitting balance-Leahy Scale: Good     Standing balance support: No upper extremity supported Standing balance-Leahy Scale: Fair                               Pertinent Vitals/Pain Pain Assessment: 0-10 Pain Score: 8  Pain Location: back Pain Descriptors / Indicators: Aching Pain Intervention(s): Limited activity within patient's tolerance;Monitored during session;Premedicated before session    Home Living Family/patient expects to be discharged to:: Private residence Living Arrangements: Children Available Help at Discharge: Family Type of Home: House Home Access: Stairs to enter Entrance Stairs-Rails: Right Entrance Stairs-Number of Steps: 5 Home Layout: Two level;Able to live on main level with bedroom/bathroom Home Equipment: None      Prior Function Level of Independence: Independent         Comments: several people in house; always someone there     Hand Dominance        Extremity/Trunk Assessment   Upper Extremity Assessment: Overall WFL for tasks assessed           Lower Extremity Assessment: Overall WFL for tasks assessed         Communication   Communication: No difficulties  Cognition Arousal/Alertness: Awake/alert Behavior During Therapy: WFL for tasks assessed/performed Overall Cognitive Status: Within Functional Limits for tasks assessed                      General Comments      Exercises        Assessment/Plan    PT Assessment Patient needs continued PT services  PT Diagnosis Difficulty walking   PT Problem List Decreased strength;Decreased range of motion;Decreased activity tolerance;Decreased mobility;Decreased balance;Decreased knowledge of use of DME;Pain  PT Treatment Interventions DME instruction;Gait training;Stair training;Functional mobility training;Therapeutic activities;Therapeutic exercise;Balance training;Patient/family education   PT Goals (Current  goals can be found in the Care Plan section) Acute Rehab PT Goals Patient Stated Goal: home PT Goal Formulation: With patient Time For Goal Achievement: 04/20/16 Potential to Achieve Goals: Good    Frequency Min 3X/week   Barriers to discharge        Co-evaluation               End of Session Equipment Utilized During Treatment: Oxygen;Gait belt Activity Tolerance: Patient tolerated treatment well Patient left: in chair;with call bell/phone within reach;with chair alarm set Nurse Communication: Mobility status         Time: 0045-9977 PT Time Calculation (min) (ACUTE ONLY): 29 min   Charges:   PT Evaluation $PT Eval Moderate Complexity: 1 Procedure     PT G Codes:        Emily Moran 2016-05-01, 5:45 PM

## 2016-04-06 NOTE — Evaluation (Signed)
Occupational Therapy Evaluation Patient Details Name: Emily Moran MRN: [id number] DOB: 09-19-1956 Today's Date: 04/06/2016    History of Present Illness Pt presented with dizziness, cough, and SOB.  She was diagnosed with septic shock.  She has a PMH signficant for COPD, HTN, depression, anxiety, DDD, chronic back and neck pain and drug abuse   Clinical Impression   This 60 year old female was was admitted for the above. She was independent with adls prior to admission.  Will follow in acute setting with supervision level goals.     Follow Up Recommendations  Supervision/Assistance - 24 hour    Equipment Recommendations  None recommended by OT (pt has a chair she can put in the shower)    Recommendations for Other Services       Precautions / Restrictions Precautions Precautions: Fall Precaution Comments: check sats Restrictions Weight Bearing Restrictions: No      Mobility Bed Mobility Overal bed mobility: Independent             General bed mobility comments: assistance only for lines  Transfers Overall transfer level: Needs assistance Equipment used: None Transfers: Sit to/from Stand Sit to Stand: Min guard         General transfer comment: close guard for safety    Balance                                            ADL Overall ADL's : Needs assistance/impaired     Grooming: Wash/dry hands;Min guard;Standing   Upper Body Bathing: Set up;Sitting   Lower Body Bathing: Min guard;Sit to/from stand   Upper Body Dressing : Set up;Sitting   Lower Body Dressing: Min guard;Sit to/from stand   Toilet Transfer: Minimal assistance;Min guard;BSC;RW;Ambulation   Toileting- Water quality scientist and Hygiene: Min guard;Sit to/from stand         General ADL Comments: min guard for steadying during adls.  Min A ambulating without RW; min guard with it, +2 for lines when ambulating. Pt uses 02 prn at baseline. Was on 4 liters; removed  momentarily and sats dropped to 85%     Vision     Perception     Praxis      Pertinent Vitals/Pain Pain Assessment: 0-10 Pain Score: 8  Pain Location: back Pain Descriptors / Indicators: Aching Pain Intervention(s): Limited activity within patient's tolerance;Monitored during session;Repositioned     Hand Dominance     Extremity/Trunk Assessment Upper Extremity Assessment Upper Extremity Assessment: Overall WFL for tasks assessed           Communication Communication Communication: No difficulties   Cognition Arousal/Alertness: Awake/alert Behavior During Therapy: WFL for tasks assessed/performed Overall Cognitive Status: Within Functional Limits for tasks assessed                     General Comments       Exercises       Shoulder Instructions      Home Living Family/patient expects to be discharged to:: Private residence Living Arrangements: Children   Type of Home: House Home Access: Stairs to enter Technical brewer of Steps: 5 Entrance Stairs-Rails:  (has a rail) Home Layout: Two level (only stays on first floor)     Bathroom Shower/Tub: Occupational psychologist: Standard     Home Equipment: None  Prior Functioning/Environment Level of Independence: Independent        Comments: several people in house; always someone there    OT Diagnosis: Generalized weakness   OT Problem List: Decreased strength;Impaired balance (sitting and/or standing);Decreased knowledge of use of DME or AE;Pain   OT Treatment/Interventions: Self-care/ADL training;DME and/or AE instruction;Patient/family education;Balance training    OT Goals(Current goals can be found in the care plan section) Acute Rehab OT Goals Patient Stated Goal: home OT Goal Formulation: With patient Time For Goal Achievement: 04/13/16 Potential to Achieve Goals: Good ADL Goals Pt Will Transfer to Toilet: with supervision;regular height  toilet;ambulating Additional ADL Goal #1: pt will gather clothes  and complete ADL at supervision level and initiate at least one rest break for energy conservation   OT Frequency: Min 2X/week   Barriers to D/C:            Co-evaluation              End of Session    Activity Tolerance: Patient tolerated treatment well Patient left: in chair;with call bell/phone within reach;with chair alarm set   Time: 0539-7673 OT Time Calculation (min): 26 min Charges:  OT General Charges $OT Visit: 1 Procedure OT Evaluation $OT Eval Moderate Complexity: 1 Procedure G-Codes:    [place] Apr 11, 2016, 3:50 PM   Lesle Chris, OTR/L [phone] 2016/04/11

## 2016-04-07 DIAGNOSIS — F17200 Nicotine dependence, unspecified, uncomplicated: Secondary | ICD-10-CM

## 2016-04-07 DIAGNOSIS — I10 Essential (primary) hypertension: Secondary | ICD-10-CM

## 2016-04-07 LAB — BASIC METABOLIC PANEL
Anion gap: 5 (ref 5–15)
BUN: 17 mg/dL (ref 6–20)
CALCIUM: 8.6 mg/dL — AB (ref 8.9–10.3)
CHLORIDE: 110 mmol/L (ref 101–111)
CO2: 25 mmol/L (ref 22–32)
CREATININE: 1.35 mg/dL — AB (ref 0.44–1.00)
GFR calc Af Amer: 49 mL/min — ABNORMAL LOW (ref >60)
GFR calc non Af Amer: 42 mL/min — ABNORMAL LOW (ref >60)
Glucose, Bld: 84 mg/dL (ref 65–99)
Potassium: 3.8 mmol/L (ref 3.5–5.1)
SODIUM: 140 mmol/L (ref 135–145)

## 2016-04-07 LAB — CBC
HCT: 35.8 % — ABNORMAL LOW (ref 36.0–46.0)
Hemoglobin: 11.6 g/dL — ABNORMAL LOW (ref 12.0–15.0)
MCH: 30.9 pg (ref 26.0–34.0)
MCHC: 32.4 g/dL (ref 30.0–36.0)
MCV: 95.2 fL (ref 78.0–100.0)
PLATELETS: 202 10*3/uL (ref 150–400)
RBC: 3.76 MIL/uL — ABNORMAL LOW (ref 3.87–5.11)
RDW: 14.6 % (ref 11.5–15.5)
WBC: 7.9 10*3/uL (ref 4.0–10.5)

## 2016-04-07 MED ORDER — GABAPENTIN 400 MG PO CAPS
400.0000 mg | ORAL_CAPSULE | Freq: Three times a day (TID) | ORAL | Status: DC
  Administered 2016-04-07 (×2): 400 mg via ORAL
  Filled 2016-04-07 (×2): qty 1

## 2016-04-07 MED ORDER — AMOXICILLIN-POT CLAVULANATE 875-125 MG PO TABS
1.0000 | ORAL_TABLET | Freq: Two times a day (BID) | ORAL | Status: DC
  Administered 2016-04-07 (×2): 1 via ORAL
  Filled 2016-04-07 (×2): qty 1

## 2016-04-07 MED ORDER — AMLODIPINE BESYLATE 5 MG PO TABS
5.0000 mg | ORAL_TABLET | Freq: Every day | ORAL | Status: DC
  Administered 2016-04-07: 5 mg via ORAL
  Filled 2016-04-07: qty 1

## 2016-04-07 NOTE — Progress Notes (Addendum)
PROGRESS NOTE    Emily Moran  WNI:[id number] DOB: Mar 15, 1956 DOA: 04/05/2016  PCP: Philis Fendt, MD   Brief Narrative:  60 y.o. female with medical history significant of drug abuse on methadone, hypertension, COPD, depression, anxiety, tobacco abuse, stomach ulcer, HCV, DDD, chronic neck pain and back pain. She began to have increase in cough and dyspnea about 2 days ago and then developed vomiting and abdominal pain that save evening which continued until the next day. She has started treatment for Hep C about 1 mo ago and tells me that the medication occasionally causes vomiting.   Subjective: Cough and dyspnea improving. Ambulating to the bathroom without distress.   Assessment & Plan:   Principal Problem:   Septic shock- possibly due to resp infection ? Pneumonia - was here in May for CAP - BP improved with fluids - strep pneumo negative - MRSA PCR + -  blood cultures pending - CXR cannot rule out a pneumonia as she has increased Insterstitial markings (fibrosis)  - added Doxy to Vanc and Zosyn, add Duonebs Q 6 - d/c Vanc today- change Zosyn to Augmentin - transfer to med/surg  Active Problems:  AKI - prerenal and ATN from sepsis - resolving - baseline Cr 0.9  Hypokalemia and Hypomagnesemia - replaced   COPD - still no wheezing at this time - on 2.5 L of O2 at baseline-   has a chronic cough with is worse for 2 days - Cont nebs-  Leukocytosis - WBC normalized  Vomiting and abdominal pain  - resolved - advance to solids today    Hepatitis C - on Epclusa per GI- Dr Paulita Fujita    TOBACCO ABUSE - smokes 5 cigarettes / day- advised to stop    Substance abuse - marijuana + UDS- discussed with patient and advised to stop    HTN - held Norvasc- will resume today  Heroin abuse - this is why she was initiated on Methadone 2 yrs ago but she states she essentially needs it now for neck and lower back pain    DVT prophylaxis: Lovenox Code Status: full  code Family Communication:  Disposition Plan: home in 2-3 days Consultants:   none Procedures:   none Antimicrobials:  Anti-infectives    Start     Dose/Rate Route Frequency Ordered Stop   04/06/16 2200  vancomycin (VANCOCIN) 500 mg in sodium chloride 0.9 % 100 mL IVPB  Status:  Discontinued     500 mg 100 mL/hr over 60 Minutes Intravenous Every 24 hours 04/05/16 2206 04/06/16 1544   04/06/16 2200  vancomycin (VANCOCIN) IVPB 1000 mg/200 mL premix  Status:  Discontinued     1,000 mg 200 mL/hr over 60 Minutes Intravenous Every 24 hours 04/06/16 1544 04/07/16 0811   04/06/16 1000  Sofosbuvir-Velpatasvir 400-100 MG TABS 1 tablet     1 tablet Oral Daily 04/05/16 2253     04/06/16 0900  doxycycline (VIBRA-TABS) tablet 100 mg     100 mg Oral Every 12 hours 04/06/16 0849     04/06/16 0600  piperacillin-tazobactam (ZOSYN) IVPB 3.375 g     3.375 g 12.5 mL/hr over 240 Minutes Intravenous Every 8 hours 04/05/16 2253     04/05/16 2215  vancomycin (VANCOCIN) IVPB 1000 mg/200 mL premix     1,000 mg 200 mL/hr over 60 Minutes Intravenous STAT 04/05/16 2206 04/05/16 2319   04/05/16 2200  piperacillin-tazobactam (ZOSYN) IVPB 3.375 g     3.375 g 12.5 mL/hr over 240 Minutes Intravenous  Once 04/05/16 2145 04/06/16 0224       Objective: Filed Vitals:   04/07/16 0502 04/07/16 0600 04/07/16 0805 04/07/16 0845  BP: 147/113 150/99 137/94   Pulse: 66 67 80   Temp:      TempSrc:      Resp: '14 17 18   '$ Height:      Weight:      SpO2: 90% 94% 95% 96%    Intake/Output Summary (Last 24 hours) at 04/07/16 1130 Last data filed at 04/07/16 0600  Gross per 24 hour  Intake   2550 ml  Output   2800 ml  Net   -250 ml   Filed Weights   04/05/16 2201 04/06/16 0429 04/07/16 0344  Weight: 61.236 kg (135 lb) 61.9 kg (136 lb 7.4 oz) 61.5 kg (135 lb 9.3 oz)    Examination: General exam: Appears comfortable  HEENT: PERRLA, oral mucosa moist, no sclera icterus or thrush Respiratory system: Clear to  auscultation but poor air entry  Respiratory effort normal. Cardiovascular system: S1 & S2 heard, RRR.  No murmurs  Gastrointestinal system: Abdomen soft, non-tender, nondistended. Normal bowel sound. No organomegaly Central nervous system: Alert and oriented. No focal neurological deficits. Extremities: No cyanosis, clubbing or edema Skin: No rashes or ulcers Psychiatry:  Mood & affect appropriate.     Data Reviewed: I have personally reviewed following labs and imaging studies  CBC:  Recent Labs Lab 04/05/16 2028 04/05/16 2045 04/05/16 2337 04/06/16 0919 04/07/16 0345  WBC 14.7*  --  12.9* 13.1* 7.9  NEUTROABS 9.2*  --  7.7  --   --   HGB 12.5 13.3 10.5* 12.3 11.6*  HCT 36.9 39.0 30.9* 36.8 35.8*  MCV 90.9  --  89.8 89.5 95.2  PLT 225  --  170 230 627   Basic Metabolic Panel:  Recent Labs Lab 04/05/16 2028 04/05/16 2045 04/05/16 2337 04/06/16 0208 04/06/16 0919 04/07/16 0345  NA 133* 134* 136  --  134* 140  K 3.4* 3.5 3.1*  --  3.2* 3.8  CL 100* 100* 110  --  104 110  CO2 21*  --  21*  --  19* 25  GLUCOSE 98 92 93  --  163* 84  BUN 35* 38* 31*  --  26* 17  CREATININE 2.84* 2.80* 2.41*  --  1.83* 1.35*  CALCIUM 7.9*  --  6.5*  --  8.3* 8.6*  MG  --   --   --  1.6* 2.0  --    GFR: Estimated Creatinine Clearance: 42.5 mL/min (by C-G formula based on Cr of 1.35). Liver Function Tests:  Recent Labs Lab 04/05/16 2337  AST 22  ALT 11*  ALKPHOS 89  BILITOT 0.6  PROT 4.9*  ALBUMIN 2.7*   No results for input(s): LIPASE, AMYLASE in the last 168 hours. No results for input(s): AMMONIA in the last 168 hours. Coagulation Profile:  Recent Labs Lab 04/05/16 2337  INR 1.33   Cardiac Enzymes: No results for input(s): CKTOTAL, CKMB, CKMBINDEX, TROPONINI in the last 168 hours. BNP (last 3 results) No results for input(s): PROBNP in the last 8760 hours. HbA1C: No results for input(s): HGBA1C in the last 72 hours. CBG: No results for input(s): GLUCAP in the  last 168 hours. Lipid Profile: No results for input(s): CHOL, HDL, LDLCALC, TRIG, CHOLHDL, LDLDIRECT in the last 72 hours. Thyroid Function Tests: No results for input(s): TSH, T4TOTAL, FREET4, T3FREE, THYROIDAB in the last 72 hours. Anemia Panel: No results for  input(s): VITAMINB12, FOLATE, FERRITIN, TIBC, IRON, RETICCTPCT in the last 72 hours. Urine analysis:    Component Value Date/Time   COLORURINE AMBER* 04/05/2016 2127   APPEARANCEUR CLOUDY* 04/05/2016 2127   LABSPEC 1.021 04/05/2016 2127   PHURINE 5.5 04/05/2016 2127   GLUCOSEU NEGATIVE 04/05/2016 2127   HGBUR TRACE* 04/05/2016 2127   BILIRUBINUR SMALL* 04/05/2016 2127   KETONESUR NEGATIVE 04/05/2016 2127   PROTEINUR 100* 04/05/2016 2127   UROBILINOGEN 4.0* 06/03/2012 1648   NITRITE NEGATIVE 04/05/2016 2127   LEUKOCYTESUR TRACE* 04/05/2016 2127   Sepsis Labs: '@LABRCNTIP'$ (procalcitonin:4,lacticidven:4) ) Recent Results (from the past 240 hour(s))  MRSA PCR Screening     Status: Abnormal   Collection Time: 04/06/16  1:00 AM  Result Value Ref Range Status   MRSA by PCR POSITIVE (A) NEGATIVE Final    Comment:        The GeneXpert MRSA Assay (FDA approved for NASAL specimens only), is one component of a comprehensive MRSA colonization surveillance program. It is not intended to diagnose MRSA infection nor to guide or monitor treatment for MRSA infections. RESULT CALLED TO, READ BACK BY AND VERIFIED WITH: N.PALANIVEL,RN 0251 04/06/16 [place]   Respiratory Panel by PCR     Status: None   Collection Time: 04/06/16  3:04 AM  Result Value Ref Range Status   Adenovirus NOT DETECTED NOT DETECTED Final   Coronavirus 229E NOT DETECTED NOT DETECTED Final   Coronavirus HKU1 NOT DETECTED NOT DETECTED Final   Coronavirus NL63 NOT DETECTED NOT DETECTED Final   Coronavirus OC43 NOT DETECTED NOT DETECTED Final   Metapneumovirus NOT DETECTED NOT DETECTED Final   Rhinovirus / Enterovirus NOT DETECTED NOT DETECTED Final   Influenza A  NOT DETECTED NOT DETECTED Final   Influenza A H1 NOT DETECTED NOT DETECTED Final   Influenza A H1 2009 NOT DETECTED NOT DETECTED Final   Influenza A H3 NOT DETECTED NOT DETECTED Final   Influenza B NOT DETECTED NOT DETECTED Final   Parainfluenza Virus 1 NOT DETECTED NOT DETECTED Final   Parainfluenza Virus 2 NOT DETECTED NOT DETECTED Final   Parainfluenza Virus 3 NOT DETECTED NOT DETECTED Final   Parainfluenza Virus 4 NOT DETECTED NOT DETECTED Final   Respiratory Syncytial Virus NOT DETECTED NOT DETECTED Final   Bordetella pertussis NOT DETECTED NOT DETECTED Final   Chlamydophila pneumoniae NOT DETECTED NOT DETECTED Final   Mycoplasma pneumoniae NOT DETECTED NOT DETECTED Final         Radiology Studies: Dg Chest Port 1 View  04/06/2016  CLINICAL DATA:  Suspected pneumonia, history of COPD, current smoker, hepatitis-C. EXAM: PORTABLE CHEST 1 VIEW COMPARISON:  Portable chest x-ray of April 05, 2016 FINDINGS: Severe bullous change in the right upper lobe is again demonstrated. The interstitial markings in the right mid and lower lung remain increased. The interstitial markings on the left are diffusely increased. There is no alveolar infiltrate. There is no pleural effusion. The heart and pulmonary vascularity are normal. The observed bony thorax is unremarkable. IMPRESSION: Bullous emphysematous change greatest in the left upper lobe. Persistently increased interstitial markings diffusely consistent with pulmonary fibrotic change. One cannot exclude superimposed interstitial pneumonia. No alveolar pneumonia nor CHF. Electronically Signed   By: David  [place] M.D.   On: 04/06/2016 09:14   Dg Chest Port 1 View  04/05/2016  CLINICAL DATA:  Dizziness.  Loss of consciousness. EXAM: PORTABLE CHEST 1 VIEW COMPARISON:  March 09, 2016 FINDINGS: Emphysematous changes with a large bulla in the right upper lung are stable.  There is a nodular density in the left upper lung measuring 9 mm. Increased interstitial  markings in the bases are largely a due to the patient's known fibrosis reported on the previous study. No acute findings seen on the left. The opacity in the right base is a little more prominent in the interval and an acute on chronic process is not excluded. The cardiomediastinal silhouette is stable. IMPRESSION: 1. Emphysematous changes. 2. A nodular density projects over the left upper lobe, not seen on other recent priors. It is possible this is artifactual. Recommend short-term follow-up to ensure resolution. 3. Known fibrotic changes in the lung bases. The opacity is a little more prominent and focal in the right base suggesting an acute on chronic process. Electronically Signed   By: Dorise Bullion III M.D   On: 04/05/2016 21:02      Scheduled Meds: . antiseptic oral rinse  7 mL Mouth Rinse BID  . dextromethorphan-guaiFENesin  1 tablet Oral BID  . doxycycline  100 mg Oral Q12H  . DULoxetine  60 mg Oral BID  . enoxaparin (LOVENOX) injection  40 mg Subcutaneous Q24H  . gabapentin  800 mg Oral TID  . ipratropium-albuterol  3 mL Nebulization TID  . methadone  110 mg Oral Daily  . nicotine  7 mg Transdermal Daily  . piperacillin-tazobactam (ZOSYN)  IV  3.375 g Intravenous Q8H  . Sofosbuvir-Velpatasvir  1 tablet Oral Daily   Continuous Infusions:     LOS: 2 days    Time spent in minutes: 55    [place], MD [facility] Pager: [email or web address] Password [email or web address] 04/07/2016, 11:30 AM

## 2016-04-08 LAB — BASIC METABOLIC PANEL
ANION GAP: 5 (ref 5–15)
BUN: 10 mg/dL (ref 6–20)
CO2: 26 mmol/L (ref 22–32)
Calcium: 8.7 mg/dL — ABNORMAL LOW (ref 8.9–10.3)
Chloride: 110 mmol/L (ref 101–111)
Creatinine, Ser: 1.02 mg/dL — ABNORMAL HIGH (ref 0.44–1.00)
GFR, EST NON AFRICAN AMERICAN: 59 mL/min — AB (ref >60)
GLUCOSE: 84 mg/dL (ref 65–99)
POTASSIUM: 3.1 mmol/L — AB (ref 3.5–5.1)
Sodium: 141 mmol/L (ref 135–145)

## 2016-04-08 MED ORDER — DOXYCYCLINE HYCLATE 100 MG PO TABS: 100.0000 mg | ORAL_TABLET | Freq: Two times a day (BID) | ORAL | Status: DC

## 2016-04-08 MED ORDER — TIOTROPIUM BROMIDE MONOHYDRATE 18 MCG IN CAPS: 18.0000 ug | ORAL_CAPSULE | Freq: Every day | RESPIRATORY_TRACT | Status: DC

## 2016-04-08 MED ORDER — POTASSIUM CHLORIDE CRYS ER 20 MEQ PO TBCR: 40.0000 meq | EXTENDED_RELEASE_TABLET | ORAL | Status: DC

## 2016-04-08 MED ORDER — ALBUTEROL SULFATE (2.5 MG/3ML) 0.083% IN NEBU: 2.5000 mg | INHALATION_SOLUTION | Freq: Four times a day (QID) | RESPIRATORY_TRACT | Status: DC | PRN

## 2016-04-08 MED ORDER — IPRATROPIUM-ALBUTEROL 0.5-2.5 (3) MG/3ML IN SOLN: 3.0000 mL | Freq: Four times a day (QID) | RESPIRATORY_TRACT | Status: DC | PRN

## 2016-04-08 MED ORDER — AMLODIPINE BESYLATE 10 MG PO TABS: 10.0000 mg | ORAL_TABLET | Freq: Every day | ORAL | Status: DC

## 2016-04-08 MED ORDER — DM-GUAIFENESIN ER 30-600 MG PO TB12: 1.0000 | ORAL_TABLET | Freq: Two times a day (BID) | ORAL | Status: DC

## 2016-04-08 MED ORDER — ONDANSETRON HCL 4 MG PO TABS: 4.0000 mg | ORAL_TABLET | ORAL | Status: DC | PRN

## 2016-04-08 MED ORDER — ALBUTEROL SULFATE HFA 108 (90 BASE) MCG/ACT IN AERS: 2.0000 | INHALATION_SPRAY | Freq: Four times a day (QID) | RESPIRATORY_TRACT | Status: DC | PRN

## 2016-04-08 MED ORDER — BENZONATATE 100 MG PO CAPS: 100.0000 mg | ORAL_CAPSULE | Freq: Three times a day (TID) | ORAL | Status: DC | PRN

## 2016-04-08 MED ORDER — FLUTICASONE-SALMETEROL 250-50 MCG/DOSE IN AEPB: 1.0000 | INHALATION_SPRAY | Freq: Two times a day (BID) | RESPIRATORY_TRACT | Status: DC

## 2016-04-08 MED ORDER — AMOXICILLIN-POT CLAVULANATE 875-125 MG PO TABS: 1.0000 | ORAL_TABLET | Freq: Two times a day (BID) | ORAL | Status: DC

## 2016-04-08 MED ORDER — NICOTINE 7 MG/24HR TD PT24: 7.0000 mg | MEDICATED_PATCH | Freq: Every day | TRANSDERMAL | Status: DC

## 2016-04-08 NOTE — Discharge Summary (Signed)
Physician Discharge Summary  Emily Moran:[id number] DOB: April 17, 1956 DOA: 04/05/2016  PCP: Philis Fendt, MD  Admit date: 04/05/2016 Discharge date: 04/08/2016  Admitted From: home Disposition:  home   Recommendations for Outpatient Follow-up:  1.    Home Health:  none  Equipment/Devices:  Already on O    Discharge Condition:  stable   CODE STATUS:  Full code   Diet recommendation:  Heart healthy Consultations:  none    Discharge Diagnoses:  Principal Problem:   Septic shock ([place]) Active Problems:   HCAP (healthcare-associated pneumonia)   Hepatitis C   TOBACCO ABUSE   Substance abuse   COPD (chronic obstructive pulmonary disease) (HCC)   HTN (hypertension)    Subjective: Feeling much better today. Cough is better, less sputum, no dyspnea. Will try to quit smoking if it prescribe patches.   Brief Summary: 60 y.o. female with medical history significant of drug abuse on methadone, hypertension, COPD, depression, anxiety, tobacco abuse, stomach ulcer, HCV, DDD, chronic neck pain and back pain. She began to have increase in cough and dyspnea about 2 days ago and then developed vomiting and abdominal pain that same evening which continued until the next day. She has started treatment for Hep C about 1 mo ago and tells me that the medication occasionally causes vomiting.   Hospital Course:  Principal Problem:  Septic shock- possibly due to resp infection ? Pneumonia - BP 75/45, WBC count 14.7 - was here in May for CAP - BP improved with fluids - strep pneumo negative - MRSA PCR + - blood cultures negative - resp viral panel negative - CXR cannot rule out a pneumonia as she has increased Insterstitial markings (fibrosis)  - added Doxy to Vanc and Zosyn, add Duonebs Q 6 -  Discharge on Doxycycline and Augmentin    Active Problems:  AKI - prerenal and ATN from sepsis - resolving- Cr 2.84>> 1.02 - baseline Cr 0.9  Hypokalemia and Hypomagnesemia -  replaced   COPD- NO exacerbation -   no wheezing   - on 2.5 L of O2 at baseline-  - Cont nebs-  Leukocytosis - WBC normalized  Vomiting and abdominal pain  - resolved - advanced to solidsd   Hepatitis C - on Epclusa per GI- Dr Paulita Fujita - will prescribe Zofran to use PRN nausea   TOBACCO ABUSE - smokes 5 cigarettes / day- advised to stop- Nicoderm patches ordered   Substance abuse - marijuana + UDS- discussed with patient and advised to stop   HTN -   Norvasc - had stopped taking this at home- advised to resume  Heroin abuse - this is why she was initiated on Methadone 2 yrs ago but she states she essentially needs it now for neck and lower back pain    DVT prophylaxis: Lovenox  Discharge Instructions      Discharge Instructions    Diet - low sodium heart healthy    Complete by:  As directed      Increase activity slowly    Complete by:  As directed             Medication List    STOP taking these medications        azithromycin 250 MG tablet  Commonly known as:  ZITHROMAX     cefpodoxime 200 MG tablet  Commonly known as:  VANTIN     predniSONE 5 MG tablet  Commonly known as:  DELTASONE      TAKE these medications  albuterol (2.5 MG/3ML) 0.083% nebulizer solution  Commonly known as:  PROVENTIL  Take 6 mLs (5 mg total) by nebulization every 6 (six) hours as needed for wheezing or shortness of breath.     albuterol 108 (90 Base) MCG/ACT inhaler  Commonly known as:  PROVENTIL HFA;VENTOLIN HFA  Inhale 2 puffs into the lungs every 6 (six) hours as needed for wheezing or shortness of breath.     albuterol (2.5 MG/3ML) 0.083% nebulizer solution  Commonly known as:  PROVENTIL  Take 3 mLs (2.5 mg total) by nebulization every 6 (six) hours as needed for wheezing or shortness of breath (buy this only if you cannot afford the Duoneb).     amLODipine 10 MG tablet  Commonly known as:  NORVASC  Take 1 tablet (10 mg total) by mouth daily.      amoxicillin-clavulanate 875-125 MG tablet  Commonly known as:  AUGMENTIN  Take 1 tablet by mouth 2 (two) times daily.     benzonatate 100 MG capsule  Commonly known as:  TESSALON  Take 1 capsule (100 mg total) by mouth 3 (three) times daily as needed for cough.     dextromethorphan-guaiFENesin 30-600 MG 12hr tablet  Commonly known as:  MUCINEX DM  Take 1 tablet by mouth 2 (two) times daily.     doxycycline 100 MG tablet  Commonly known as:  VIBRA-TABS  Take 1 tablet (100 mg total) by mouth every 12 (twelve) hours.     DULoxetine 60 MG capsule  Commonly known as:  CYMBALTA  Take 60 mg by mouth 2 (two) times daily.     EPCLUSA 400-100 MG Tabs  Generic drug:  Sofosbuvir-Velpatasvir  Take 1 tablet by mouth daily.     Fluticasone-Salmeterol 250-50 MCG/DOSE Aepb  Commonly known as:  ADVAIR DISKUS  Inhale 1 puff into the lungs 2 (two) times daily.     gabapentin 800 MG tablet  Commonly known as:  NEURONTIN  Take 800 mg by mouth 3 (three) times daily.     ipratropium-albuterol 0.5-2.5 (3) MG/3ML Soln  Commonly known as:  DUONEB  Take 3 mLs by nebulization every 6 (six) hours as needed.     methadone 10 MG/ML solution  Commonly known as:  DOLOPHINE  Take 110 mg by mouth daily.     Nebulizer [employer] for home use.     nicotine 7 mg/24hr patch  Commonly known as:  NICODERM CQ  Place 1 patch (7 mg total) onto the skin daily.     ondansetron 4 MG tablet  Commonly known as:  ZOFRAN  Take 1 tablet (4 mg total) by mouth every 4 (four) hours as needed for nausea or vomiting. Replace with the ODT if it is covered by insurance     tiotropium 18 MCG inhalation capsule  Commonly known as:  SPIRIVA HANDIHALER  Place 1 capsule (18 mcg total) into inhaler and inhale daily.       Follow-up Information    Follow up with Philis Fendt, MD In 1 week.   Specialty:  Internal Medicine   Contact information:   [place] [address] [phone]        Follow up with [facility] In 1 week.   Specialty:  Pulmonology   Why:  get appt   Contact information:   [place] [phone]     Allergies  Allergen Reactions  . Aspirin     Upset stomach  Procedures/Studies:  Dg Chest Port 1 View  04/06/2016  CLINICAL DATA:  Suspected pneumonia, history of COPD, current smoker, hepatitis-C. EXAM: PORTABLE CHEST 1 VIEW COMPARISON:  Portable chest x-ray of April 05, 2016 FINDINGS: Severe bullous change in the right upper lobe is again demonstrated. The interstitial markings in the right mid and lower lung remain increased. The interstitial markings on the left are diffusely increased. There is no alveolar infiltrate. There is no pleural effusion. The heart and pulmonary vascularity are normal. The observed bony thorax is unremarkable. IMPRESSION: Bullous emphysematous change greatest in the left upper lobe. Persistently increased interstitial markings diffusely consistent with pulmonary fibrotic change. One cannot exclude superimposed interstitial pneumonia. No alveolar pneumonia nor CHF. Electronically Signed   By: David  Martinique M.D.   On: 04/06/2016 09:14   Dg Chest Port 1 View  04/05/2016  CLINICAL DATA:  Dizziness.  Loss of consciousness. EXAM: PORTABLE CHEST 1 VIEW COMPARISON:  March 09, 2016 FINDINGS: Emphysematous changes with a large bulla in the right upper lung are stable. There is a nodular density in the left upper lung measuring 9 mm. Increased interstitial markings in the bases are largely a due to the patient's known fibrosis reported on the previous study. No acute findings seen on the left. The opacity in the right base is a little more prominent in the interval and an acute on chronic process is not excluded. The cardiomediastinal silhouette is stable. IMPRESSION: 1. Emphysematous changes. 2. A nodular density projects over the left upper lobe, not seen on other recent priors. It is  possible this is artifactual. Recommend short-term follow-up to ensure resolution. 3. Known fibrotic changes in the lung bases. The opacity is a little more prominent and focal in the right base suggesting an acute on chronic process. Electronically Signed   By: Dorise Bullion III M.D   On: 04/05/2016 21:02       Discharge Exam: Filed Vitals:   04/08/16 0400 04/08/16 0800  BP:    Pulse:    Temp: 99.2 F (37.3 C) 97.6 F (36.4 C)  Resp:     Filed Vitals:   04/08/16 0359 04/08/16 0400 04/08/16 0800 04/08/16 0819  BP: 172/99     Pulse: 79     Temp:  99.2 F (37.3 C) 97.6 F (36.4 C)   TempSrc:  Oral Oral   Resp: 12     Height:      Weight:      SpO2: 94%   94%    General: Pt is alert, awake, not in acute distress Cardiovascular: RRR, S1/S2 +, no rubs, no gallops Respiratory: CTA bilaterally, no wheezing, no rhonchi Abdominal: Soft, NT, ND, bowel sounds + Extremities: no edema, no cyanosis    The results of significant diagnostics from this hospitalization (including imaging, microbiology, ancillary and laboratory) are listed below for reference.     Microbiology: Recent Results (from the past 240 hour(s))  Culture, blood (Routine X 2) w Reflex to ID Panel     Status: None (Preliminary result)   Collection Time: 04/05/16 10:05 PM  Result Value Ref Range Status   Specimen Description BLOOD RIGHT ANTECUBITAL  Final   Special Requests IN PEDIATRIC BOTTLE 5CC  Final   Culture   Final    NO GROWTH 2 DAYS Performed at [facility]    Report Status PENDING  Incomplete  Culture, blood (Routine X 2) w Reflex to ID Panel     Status: None (Preliminary result)   Collection Time:  04/05/16 10:09 PM  Result Value Ref Range Status   Specimen Description LEFT ANTECUBITAL  Final   Special Requests BOTTLES DRAWN AEROBIC AND ANAEROBIC 6CC  Final   Culture   Final    NO GROWTH 2 DAYS Performed at [facility]    Report Status PENDING  Incomplete  MRSA PCR Screening      Status: Abnormal   Collection Time: 04/06/16  1:00 AM  Result Value Ref Range Status   MRSA by PCR POSITIVE (A) NEGATIVE Final    Comment:        The GeneXpert MRSA Assay (FDA approved for NASAL specimens only), is one component of a comprehensive MRSA colonization surveillance program. It is not intended to diagnose MRSA infection nor to guide or monitor treatment for MRSA infections. RESULT CALLED TO, READ BACK BY AND VERIFIED WITH: N.PALANIVEL,RN 0251 04/06/16 [place]   Respiratory Panel by PCR     Status: None   Collection Time: 04/06/16  3:04 AM  Result Value Ref Range Status   Adenovirus NOT DETECTED NOT DETECTED Final   Coronavirus 229E NOT DETECTED NOT DETECTED Final   Coronavirus HKU1 NOT DETECTED NOT DETECTED Final   Coronavirus NL63 NOT DETECTED NOT DETECTED Final   Coronavirus OC43 NOT DETECTED NOT DETECTED Final   Metapneumovirus NOT DETECTED NOT DETECTED Final   Rhinovirus / Enterovirus NOT DETECTED NOT DETECTED Final   Influenza A NOT DETECTED NOT DETECTED Final   Influenza A H1 NOT DETECTED NOT DETECTED Final   Influenza A H1 2009 NOT DETECTED NOT DETECTED Final   Influenza A H3 NOT DETECTED NOT DETECTED Final   Influenza B NOT DETECTED NOT DETECTED Final   Parainfluenza Virus 1 NOT DETECTED NOT DETECTED Final   Parainfluenza Virus 2 NOT DETECTED NOT DETECTED Final   Parainfluenza Virus 3 NOT DETECTED NOT DETECTED Final   Parainfluenza Virus 4 NOT DETECTED NOT DETECTED Final   Respiratory Syncytial Virus NOT DETECTED NOT DETECTED Final   Bordetella pertussis NOT DETECTED NOT DETECTED Final   Chlamydophila pneumoniae NOT DETECTED NOT DETECTED Final   Mycoplasma pneumoniae NOT DETECTED NOT DETECTED Final     Labs: BNP (last 3 results)  Recent Labs  04/06/16 0208  BNP 16.1   Basic Metabolic Panel:  Recent Labs Lab 04/05/16 2028 04/05/16 2045 04/05/16 2337 04/06/16 0208 04/06/16 0919 04/07/16 0345 04/08/16 0343  NA 133* 134* 136  --  134* 140  141  K 3.4* 3.5 3.1*  --  3.2* 3.8 3.1*  CL 100* 100* 110  --  104 110 110  CO2 21*  --  21*  --  19* 25 26  GLUCOSE 98 92 93  --  163* 84 84  BUN 35* 38* 31*  --  26* 17 10  CREATININE 2.84* 2.80* 2.41*  --  1.83* 1.35* 1.02*  CALCIUM 7.9*  --  6.5*  --  8.3* 8.6* 8.7*  MG  --   --   --  1.6* 2.0  --   --    Liver Function Tests:  Recent Labs Lab 04/05/16 2337  AST 22  ALT 11*  ALKPHOS 89  BILITOT 0.6  PROT 4.9*  ALBUMIN 2.7*   No results for input(s): LIPASE, AMYLASE in the last 168 hours. No results for input(s): AMMONIA in the last 168 hours. CBC:  Recent Labs Lab 04/05/16 2028 04/05/16 2045 04/05/16 2337 04/06/16 0919 04/07/16 0345  WBC 14.7*  --  12.9* 13.1* 7.9  NEUTROABS 9.2*  --  7.7  --   --  HGB 12.5 13.3 10.5* 12.3 11.6*  HCT 36.9 39.0 30.9* 36.8 35.8*  MCV 90.9  --  89.8 89.5 95.2  PLT 225  --  170 230 202   Cardiac Enzymes: No results for input(s): CKTOTAL, CKMB, CKMBINDEX, TROPONINI in the last 168 hours. BNP: Invalid input(s): POCBNP CBG: No results for input(s): GLUCAP in the last 168 hours. D-Dimer No results for input(s): DDIMER in the last 72 hours. Hgb A1c No results for input(s): HGBA1C in the last 72 hours. Lipid Profile No results for input(s): CHOL, HDL, LDLCALC, TRIG, CHOLHDL, LDLDIRECT in the last 72 hours. Thyroid function studies No results for input(s): TSH, T4TOTAL, T3FREE, THYROIDAB in the last 72 hours.  Invalid input(s): FREET3 Anemia work up No results for input(s): VITAMINB12, FOLATE, FERRITIN, TIBC, IRON, RETICCTPCT in the last 72 hours. Urinalysis    Component Value Date/Time   COLORURINE AMBER* 04/05/2016 2127   APPEARANCEUR CLOUDY* 04/05/2016 2127   LABSPEC 1.021 04/05/2016 2127   PHURINE 5.5 04/05/2016 2127   GLUCOSEU NEGATIVE 04/05/2016 2127   HGBUR TRACE* 04/05/2016 2127   BILIRUBINUR SMALL* 04/05/2016 2127   KETONESUR NEGATIVE 04/05/2016 2127   PROTEINUR 100* 04/05/2016 2127   UROBILINOGEN 4.0*  06/03/2012 1648   NITRITE NEGATIVE 04/05/2016 2127   LEUKOCYTESUR TRACE* 04/05/2016 2127   Sepsis Labs Invalid input(s): PROCALCITONIN,  WBC,  LACTICIDVEN Microbiology Recent Results (from the past 240 hour(s))  Culture, blood (Routine X 2) w Reflex to ID Panel     Status: None (Preliminary result)   Collection Time: 04/05/16 10:05 PM  Result Value Ref Range Status   Specimen Description BLOOD RIGHT ANTECUBITAL  Final   Special Requests IN PEDIATRIC BOTTLE 5CC  Final   Culture   Final    NO GROWTH 2 DAYS Performed at [facility]    Report Status PENDING  Incomplete  Culture, blood (Routine X 2) w Reflex to ID Panel     Status: None (Preliminary result)   Collection Time: 04/05/16 10:09 PM  Result Value Ref Range Status   Specimen Description LEFT ANTECUBITAL  Final   Special Requests BOTTLES DRAWN AEROBIC AND ANAEROBIC [place]  Final   Culture   Final    NO GROWTH 2 DAYS Performed at [facility]    Report Status PENDING  Incomplete  MRSA PCR Screening     Status: Abnormal   Collection Time: 04/06/16  1:00 AM  Result Value Ref Range Status   MRSA by PCR POSITIVE (A) NEGATIVE Final    Comment:        The GeneXpert MRSA Assay (FDA approved for NASAL specimens only), is one component of a comprehensive MRSA colonization surveillance program. It is not intended to diagnose MRSA infection nor to guide or monitor treatment for MRSA infections. RESULT CALLED TO, READ BACK BY AND VERIFIED WITH: N.PALANIVEL,RN 0251 04/06/16 [place]   Respiratory Panel by PCR     Status: None   Collection Time: 04/06/16  3:04 AM  Result Value Ref Range Status   Adenovirus NOT DETECTED NOT DETECTED Final   Coronavirus 229E NOT DETECTED NOT DETECTED Final   Coronavirus HKU1 NOT DETECTED NOT DETECTED Final   Coronavirus NL63 NOT DETECTED NOT DETECTED Final   Coronavirus OC43 NOT DETECTED NOT DETECTED Final   Metapneumovirus NOT DETECTED NOT DETECTED Final   Rhinovirus / Enterovirus  NOT DETECTED NOT DETECTED Final   Influenza A NOT DETECTED NOT DETECTED Final   Influenza A H1 NOT DETECTED NOT DETECTED Final   Influenza  A H1 2009 NOT DETECTED NOT DETECTED Final   Influenza A H3 NOT DETECTED NOT DETECTED Final   Influenza B NOT DETECTED NOT DETECTED Final   Parainfluenza Virus 1 NOT DETECTED NOT DETECTED Final   Parainfluenza Virus 2 NOT DETECTED NOT DETECTED Final   Parainfluenza Virus 3 NOT DETECTED NOT DETECTED Final   Parainfluenza Virus 4 NOT DETECTED NOT DETECTED Final   Respiratory Syncytial Virus NOT DETECTED NOT DETECTED Final   Bordetella pertussis NOT DETECTED NOT DETECTED Final   Chlamydophila pneumoniae NOT DETECTED NOT DETECTED Final   Mycoplasma pneumoniae NOT DETECTED NOT DETECTED Final     Time coordinating discharge: Over 30 minutes  SIGNED:   Debbe Odea, MD  [facility] 04/08/2016, 5:32 PM Pager   If 7PM-7AM, please contact night-coverage [email or web address] Password [email or web address]

## 2016-04-09 LAB — LEGIONELLA PNEUMOPHILA SEROGP 1 UR AG: L. pneumophila Serogp 1 Ur Ag: NEGATIVE

## 2016-04-09 NOTE — ED Provider Notes (Signed)
CSN: [id number]     Arrival date & time 04/05/16  1922 History   First MD Initiated Contact with Patient 04/05/16 1932     Chief Complaint  Patient presents with  . Hypotension  . Dizziness      HPI  Patient presents for evaluation of dizziness lightheadedness and near syncope.  Distant history of heroin use and abuse. Is compliant with her methadone clinic. Was there this morning. Was given her dose. States that before and after she felt lightheaded has been lightheaded most of the day. Whenever she stands she feels dizzy like she could pass out. She denies having any pain other than some chronic back pain. Was dizzy at home and had fallen twice with standing so called paramedics. Pressure was 50 over palp then 69/42. Given 600 mL of IV fluid en route.  States she does feel somewhat weak and has had a cough and felt short of breath. No headache. No neck pain no chest pain. No nausea vomiting diarrhea or any reason to be dehydrated. His been taking by mouth without difficulty. No extremity pain or swelling.  Past Medical History  Diagnosis Date  . COPD (chronic obstructive pulmonary disease) ([place])   . Spinal stenosis   . Hypertension   . On home O2     "~ 3L once/wk and prn" (03/16/2015)  . Tobacco use   . Chronic pain   . Pain management   . Kidney stone   . Family history of adverse reaction to anesthesia     "my mother had an allergic reaction when she had kidney removed back in the '60's or '70's""  . Pneumonia     "@ least once/yr for the past 8 yrs" (03/16/2015)  . Chronic bronchitis ([place])     "I basically keep it" (03/16/2015)  . Anemia     "as a child"  . History of stomach ulcers   . Hepatitis C   . Headache     "monthly; need glasses; comes on when I'm stressed or get too tired" (03/16/2015)  . Degenerative disc disease   . DDD (degenerative disc disease), lumbar   . Arthritis     "shoulders; back" (03/16/2015)  . Chronic back pain   . Anxiety   . Depression    Past  Surgical History  Procedure Laterality Date  . Anterior cervical decomp/discectomy fusion  2002  . Total abdominal hysterectomy  1998  . Incontinence surgery  1998  . Femur im nail Right 11/28/2012    Procedure: INTRAMEDULLARY (IM) RETROGRADE FEMORAL NAILING wants jackson table , c-arm and biomet ;  Surgeon: Mauri Pole, MD;  Location: [facility];  Service: Orthopedics;  Laterality: Right;  . Back surgery    . Fracture surgery     No family history on file. Social History  Substance Use Topics  . Smoking status: Current Every Day Smoker -- 0.25 packs/day for 36 years    Types: Cigarettes  . Smokeless tobacco: Never Used  . Alcohol Use: No     Comment: denies h/o alcohol abuse on 03/16/2015   OB History    No data available     Review of Systems  Constitutional: Negative for fever, chills, diaphoresis, appetite change and fatigue.  HENT: Negative for mouth sores, sore throat and trouble swallowing.   Eyes: Negative for visual disturbance.  Respiratory: Positive for cough and shortness of breath. Negative for chest tightness and wheezing.   Cardiovascular: Negative for chest pain.  Gastrointestinal: Negative for  nausea, vomiting, abdominal pain, diarrhea and abdominal distention.  Endocrine: Negative for polydipsia, polyphagia and polyuria.  Genitourinary: Negative for dysuria, frequency and hematuria.  Musculoskeletal: Negative for gait problem.  Skin: Negative for color change, pallor and rash.  Neurological: Positive for dizziness and light-headedness. Negative for syncope and headaches.  Hematological: Does not bruise/bleed easily.  Psychiatric/Behavioral: Negative for behavioral problems and confusion.      Allergies  Aspirin  Home Medications   Prior to Admission medications   Medication Sig Start Date End Date Taking? Authorizing Provider  albuterol (PROVENTIL) (2.5 MG/3ML) 0.083% nebulizer solution Take 6 mLs (5 mg total) by nebulization every 6 (six) hours as needed  for wheezing or shortness of breath. 08/17/15  Yes Larene Pickett, PA-C  DULoxetine (CYMBALTA) 60 MG capsule Take 60 mg by mouth 2 (two) times daily.   Yes Historical Provider, MD  EPCLUSA 400-100 MG TABS Take 1 tablet by mouth daily. 03/12/16  Yes Historical Provider, MD  gabapentin (NEURONTIN) 800 MG tablet Take 800 mg by mouth 3 (three) times daily.   Yes Historical Provider, MD  methadone (DOLOPHINE) 10 MG/ML solution Take 110 mg by mouth daily.    Yes Historical Provider, MD  Respiratory Therapy Supplies (NEBULIZER) DEVI Nebulizer for home use. 08/17/15  Yes Larene Pickett, PA-C  albuterol (PROVENTIL HFA;VENTOLIN HFA) 108 (90 Base) MCG/ACT inhaler Inhale 2 puffs into the lungs every 6 (six) hours as needed for wheezing or shortness of breath. 04/08/16   Debbe Odea, MD  albuterol (PROVENTIL) (2.5 MG/3ML) 0.083% nebulizer solution Take 3 mLs (2.5 mg total) by nebulization every 6 (six) hours as needed for wheezing or shortness of breath (buy this only if you cannot afford the Duoneb). 04/08/16   Debbe Odea, MD  amLODipine (NORVASC) 10 MG tablet Take 1 tablet (10 mg total) by mouth daily. 04/08/16   Debbe Odea, MD  amoxicillin-clavulanate (AUGMENTIN) 875-125 MG tablet Take 1 tablet by mouth 2 (two) times daily. 04/08/16   Debbe Odea, MD  benzonatate (TESSALON) 100 MG capsule Take 1 capsule (100 mg total) by mouth 3 (three) times daily as needed for cough. 04/08/16   Debbe Odea, MD  dextromethorphan-guaiFENesin (MUCINEX DM) 30-600 MG 12hr tablet Take 1 tablet by mouth 2 (two) times daily. 04/08/16   Debbe Odea, MD  doxycycline (VIBRA-TABS) 100 MG tablet Take 1 tablet (100 mg total) by mouth every 12 (twelve) hours. 04/08/16   Debbe Odea, MD  Fluticasone-Salmeterol (ADVAIR DISKUS) 250-50 MCG/DOSE AEPB Inhale 1 puff into the lungs 2 (two) times daily. 04/08/16   Debbe Odea, MD  ipratropium-albuterol (DUONEB) 0.5-2.5 (3) MG/3ML SOLN Take 3 mLs by nebulization every 6 (six) hours as needed. 04/08/16   Debbe Odea, MD  nicotine (NICODERM CQ) 7 mg/24hr patch Place 1 patch (7 mg total) onto the skin daily. 04/08/16   Debbe Odea, MD  ondansetron (ZOFRAN) 4 MG tablet Take 1 tablet (4 mg total) by mouth every 4 (four) hours as needed for nausea or vomiting. Replace with the ODT if it is covered by insurance 04/08/16   Debbe Odea, MD  tiotropium (SPIRIVA HANDIHALER) 18 MCG inhalation capsule Place 1 capsule (18 mcg total) into inhaler and inhale daily. 04/08/16   Debbe Odea, MD   BP 172/99 mmHg  Pulse 79  Temp(Src) 97.6 F (36.4 C) (Oral)  Resp 12  Ht '5\' 6"'$  (1.676 m)  Wt 135 lb 9.3 oz (61.5 kg)  BMI 21.89 kg/m2  SpO2 94% Physical Exam  Constitutional: She is oriented  to person, place, and time. She appears well-developed and well-nourished. No distress.  HENT:  Head: Normocephalic.  Eyes: Conjunctivae are normal. Pupils are equal, round, and reactive to light. No scleral icterus.  Neck: Normal range of motion. Neck supple. No thyromegaly present.  Cardiovascular: Normal rate and regular rhythm.  Exam reveals no gallop and no friction rub.   No murmur heard. Pulmonary/Chest: Effort normal. No respiratory distress. She has decreased breath sounds in the right lower field and the left lower field. She has no wheezes. She has rales in the right middle field and the left middle field.  Breathsounds.Diffusecracklesleftgreaterthanrightinthemidlung.  Abdominal: Soft. Bowel sounds are normal. She exhibits no distension. There is no tenderness. There is no rebound.  Musculoskeletal: Normal range of motion.  Neurological: She is alert and oriented to person, place, and time.  Skin: Skin is warm and dry. No rash noted.  Psychiatric: She has a normal mood and affect. Her behavior is normal.    ED Course  Procedures (including critical care time) Labs Review Labs Reviewed  MRSA PCR SCREENING - Abnormal; Notable for the following:    MRSA by PCR POSITIVE (*)    All other components within normal limits   CBC WITH DIFFERENTIAL/PLATELET - Abnormal; Notable for the following:    WBC 14.7 (*)    Neutro Abs 9.2 (*)    Lymphs Abs 4.1 (*)    Monocytes Absolute 1.1 (*)    All other components within normal limits  BASIC METABOLIC PANEL - Abnormal; Notable for the following:    Sodium 133 (*)    Potassium 3.4 (*)    Chloride 100 (*)    CO2 21 (*)    BUN 35 (*)    Creatinine, Ser 2.84 (*)    Calcium 7.9 (*)    GFR calc non Af Amer 17 (*)    GFR calc Af Amer 20 (*)    All other components within normal limits  URINALYSIS, ROUTINE W REFLEX MICROSCOPIC (NOT AT [facility]) - Abnormal; Notable for the following:    Color, Urine AMBER (*)    APPearance CLOUDY (*)    Hgb urine dipstick TRACE (*)    Bilirubin Urine SMALL (*)    Protein, ur 100 (*)    Leukocytes, UA TRACE (*)    All other components within normal limits  URINE RAPID DRUG SCREEN, HOSP PERFORMED - Abnormal; Notable for the following:    Tetrahydrocannabinol POSITIVE (*)    All other components within normal limits  URINE MICROSCOPIC-ADD ON - Abnormal; Notable for the following:    Squamous Epithelial / LPF 0-5 (*)    Bacteria, UA MANY (*)    Casts GRANULAR CAST (*)    All other components within normal limits  MAGNESIUM - Abnormal; Notable for the following:    Magnesium 1.6 (*)    All other components within normal limits  CORTISOL-AM, BLOOD - Abnormal; Notable for the following:    Cortisol - AM 66.1 (*)    All other components within normal limits  CBC WITH DIFFERENTIAL/PLATELET - Abnormal; Notable for the following:    WBC 12.9 (*)    RBC 3.44 (*)    Hemoglobin 10.5 (*)    HCT 30.9 (*)    All other components within normal limits  COMPREHENSIVE METABOLIC PANEL - Abnormal; Notable for the following:    Potassium 3.1 (*)    CO2 21 (*)    BUN 31 (*)    Creatinine, Ser 2.41 (*)  Calcium 6.5 (*)    Total Protein 4.9 (*)    Albumin 2.7 (*)    ALT 11 (*)    GFR calc non Af Amer 21 (*)    GFR calc Af Amer 24 (*)    All  other components within normal limits  PROTIME-INR - Abnormal; Notable for the following:    Prothrombin Time 16.1 (*)    All other components within normal limits  BASIC METABOLIC PANEL - Abnormal; Notable for the following:    Sodium 134 (*)    Potassium 3.2 (*)    CO2 19 (*)    Glucose, Bld 163 (*)    BUN 26 (*)    Creatinine, Ser 1.83 (*)    Calcium 8.3 (*)    GFR calc non Af Amer 29 (*)    GFR calc Af Amer 34 (*)    All other components within normal limits  CBC - Abnormal; Notable for the following:    WBC 13.1 (*)    All other components within normal limits  BASIC METABOLIC PANEL - Abnormal; Notable for the following:    Creatinine, Ser 1.35 (*)    Calcium 8.6 (*)    GFR calc non Af Amer 42 (*)    GFR calc Af Amer 49 (*)    All other components within normal limits  CBC - Abnormal; Notable for the following:    RBC 3.76 (*)    Hemoglobin 11.6 (*)    HCT 35.8 (*)    All other components within normal limits  BASIC METABOLIC PANEL - Abnormal; Notable for the following:    Potassium 3.1 (*)    Creatinine, Ser 1.02 (*)    Calcium 8.7 (*)    GFR calc non Af Amer 59 (*)    All other components within normal limits  I-STAT CG4 LACTIC ACID, ED - Abnormal; Notable for the following:    Lactic Acid, Venous 1.96 (*)    All other components within normal limits  I-STAT CHEM 8, ED - Abnormal; Notable for the following:    Sodium 134 (*)    Chloride 100 (*)    BUN 38 (*)    Creatinine, Ser 2.80 (*)    Calcium, Ion 0.95 (*)    All other components within normal limits  CULTURE, BLOOD (ROUTINE X 2)  CULTURE, BLOOD (ROUTINE X 2)  RESPIRATORY PANEL BY PCR  CULTURE, EXPECTORATED SPUTUM-ASSESSMENT  GRAM STAIN  ETHANOL  BRAIN NATRIURETIC PEPTIDE  LACTIC ACID, PLASMA  LACTIC ACID, PLASMA  PROCALCITONIN  APTT  HIV ANTIBODY (ROUTINE TESTING)  STREP PNEUMONIAE URINARY ANTIGEN  CREATININE, URINE, RANDOM  SODIUM, URINE, RANDOM  MAGNESIUM  LEGIONELLA PNEUMOPHILA SEROGP 1 UR  AG    Imaging Review No results found. I have personally reviewed and evaluated these images and lab results as part of my medical decision-making.   EKG Interpretation   Date/Time:  Thursday April 05 2016 19:56:39 EDT Ventricular Rate:  63 PR Interval:    QRS Duration: 104 QT Interval:  479 QTC Calculation: 491 R Axis:   92 Text Interpretation:  Sinus rhythm Borderline right axis deviation  Borderline prolonged QT interval Confirmed by ZAVITZ MD, JOSHUA [phone]) on  04/06/2016 6:56:28 PM      MDM   Final diagnoses:  Pneumonia   IVs were placed per patient fluid resuscitated 4500 mL of fluid. Labs obtained. X-ray shows opacity in the right base. Emphysematous change in nodular density in the left upper lobe. Underwent cultures and  given antibiotics. After fluids pressures improved to 103. Acid 3.1. Given by mouth potassium. Cranial 1.35. Sodium 134. Hemoglobin 11.6. No leukocytosis. Discussed with Dr. Blaine Hamper, patient will be admitted.   CRITICAL CARE Performed by: Tanna Furry JOSEPH   Total critical care time: 60 minutes  Critical care time was exclusive of separately billable procedures and treating other patients.  Critical care was necessary to treat or prevent imminent or life-threatening deterioration.  Critical care was time spent personally by me on the following activities: development of treatment plan with patient and/or surrogate as well as nursing, discussions with consultants, evaluation of patient's response to treatment, examination of patient, obtaining history from patient or surrogate, ordering and performing treatments and interventions, ordering and review of laboratory studies, ordering and review of radiographic studies, pulse oximetry and re-evaluation of patient's condition.     Tanna Furry, MD 04/09/16 [phone]

## 2016-04-11 LAB — CULTURE, BLOOD (ROUTINE X 2)
CULTURE: NO GROWTH
CULTURE: NO GROWTH

## 2016-07-19 ENCOUNTER — Encounter (HOSPITAL_COMMUNITY): Payer: Self-pay | Admitting: *Deleted

## 2016-07-19 ENCOUNTER — Inpatient Hospital Stay (HOSPITAL_COMMUNITY)
Admission: EM | Admit: 2016-07-19 | Discharge: 2016-07-22 | DRG: 191 | Disposition: A | Discharge status: Home / Self Care | Payer: Medicare Other | Attending: Family Medicine | Admitting: Family Medicine

## 2016-07-19 ENCOUNTER — Emergency Department (HOSPITAL_COMMUNITY): Payer: Medicare Other

## 2016-07-19 DIAGNOSIS — T380X5A Adverse effect of glucocorticoids and synthetic analogues, initial encounter: Secondary | ICD-10-CM | POA: Diagnosis present

## 2016-07-19 DIAGNOSIS — M4800 Spinal stenosis, site unspecified: Secondary | ICD-10-CM | POA: Diagnosis present

## 2016-07-19 DIAGNOSIS — J9610 Chronic respiratory failure, unspecified whether with hypoxia or hypercapnia: Secondary | ICD-10-CM | POA: Diagnosis present

## 2016-07-19 DIAGNOSIS — G894 Chronic pain syndrome: Secondary | ICD-10-CM | POA: Diagnosis present

## 2016-07-19 DIAGNOSIS — F17210 Nicotine dependence, cigarettes, uncomplicated: Secondary | ICD-10-CM | POA: Diagnosis present

## 2016-07-19 DIAGNOSIS — Z8711 Personal history of peptic ulcer disease: Secondary | ICD-10-CM

## 2016-07-19 DIAGNOSIS — Z8249 Family history of ischemic heart disease and other diseases of the circulatory system: Secondary | ICD-10-CM | POA: Diagnosis not present

## 2016-07-19 DIAGNOSIS — J441 Chronic obstructive pulmonary disease with (acute) exacerbation: Principal | ICD-10-CM | POA: Diagnosis present

## 2016-07-19 DIAGNOSIS — B1920 Unspecified viral hepatitis C without hepatic coma: Secondary | ICD-10-CM | POA: Diagnosis present

## 2016-07-19 DIAGNOSIS — R911 Solitary pulmonary nodule: Secondary | ICD-10-CM

## 2016-07-19 DIAGNOSIS — F17200 Nicotine dependence, unspecified, uncomplicated: Secondary | ICD-10-CM | POA: Diagnosis present

## 2016-07-19 DIAGNOSIS — B182 Chronic viral hepatitis C: Secondary | ICD-10-CM | POA: Diagnosis not present

## 2016-07-19 DIAGNOSIS — I10 Essential (primary) hypertension: Secondary | ICD-10-CM | POA: Diagnosis present

## 2016-07-19 DIAGNOSIS — E876 Hypokalemia: Secondary | ICD-10-CM | POA: Diagnosis present

## 2016-07-19 DIAGNOSIS — Z9981 Dependence on supplemental oxygen: Secondary | ICD-10-CM

## 2016-07-19 DIAGNOSIS — D72829 Elevated white blood cell count, unspecified: Secondary | ICD-10-CM | POA: Diagnosis present

## 2016-07-19 DIAGNOSIS — Z87442 Personal history of urinary calculi: Secondary | ICD-10-CM

## 2016-07-19 DIAGNOSIS — K5900 Constipation, unspecified: Secondary | ICD-10-CM | POA: Diagnosis present

## 2016-07-19 DIAGNOSIS — M5136 Other intervertebral disc degeneration, lumbar region: Secondary | ICD-10-CM | POA: Diagnosis present

## 2016-07-19 LAB — CBC WITH DIFFERENTIAL/PLATELET
BASOS ABS: 0.1 10*3/uL (ref 0.0–0.1)
Basophils Relative: 0 %
Eosinophils Absolute: 0.4 10*3/uL (ref 0.0–0.7)
Eosinophils Relative: 3 %
HEMATOCRIT: 39.9 % (ref 36.0–46.0)
HEMOGLOBIN: 13.5 g/dL (ref 12.0–15.0)
LYMPHS ABS: 2.5 10*3/uL (ref 0.7–4.0)
LYMPHS PCT: 17 %
MCH: 30.6 pg (ref 26.0–34.0)
MCHC: 33.8 g/dL (ref 30.0–36.0)
MCV: 90.5 fL (ref 78.0–100.0)
Monocytes Absolute: 1 10*3/uL (ref 0.1–1.0)
Monocytes Relative: 7 %
NEUTROS ABS: 10.6 10*3/uL — AB (ref 1.7–7.7)
NEUTROS PCT: 73 %
PLATELETS: 265 10*3/uL (ref 150–400)
RBC: 4.41 MIL/uL (ref 3.87–5.11)
RDW: 13.2 % (ref 11.5–15.5)
WBC: 14.5 10*3/uL — AB (ref 4.0–10.5)

## 2016-07-19 LAB — BASIC METABOLIC PANEL
ANION GAP: 8 (ref 5–15)
BUN: 6 mg/dL (ref 6–20)
CHLORIDE: 106 mmol/L (ref 101–111)
CO2: 25 mmol/L (ref 22–32)
Calcium: 8.7 mg/dL — ABNORMAL LOW (ref 8.9–10.3)
Creatinine, Ser: 1.05 mg/dL — ABNORMAL HIGH (ref 0.44–1.00)
GFR calc Af Amer: >60 mL/min (ref >60)
GFR, EST NON AFRICAN AMERICAN: 57 mL/min — AB (ref >60)
GLUCOSE: 86 mg/dL (ref 65–99)
POTASSIUM: 3.1 mmol/L — AB (ref 3.5–5.1)
Sodium: 139 mmol/L (ref 135–145)

## 2016-07-19 LAB — I-STAT CHEM 8, ED
BUN: 7 mg/dL (ref 6–20)
CALCIUM ION: 1.06 mmol/L — AB (ref 1.15–1.40)
CHLORIDE: 104 mmol/L (ref 101–111)
CREATININE: 1 mg/dL (ref 0.44–1.00)
GLUCOSE: 85 mg/dL (ref 65–99)
HCT: 43 % (ref 36.0–46.0)
Hemoglobin: 14.6 g/dL (ref 12.0–15.0)
POTASSIUM: 3.3 mmol/L — AB (ref 3.5–5.1)
Sodium: 141 mmol/L (ref 135–145)
TCO2: 29 mmol/L (ref 0–100)

## 2016-07-19 MED ORDER — DOXYCYCLINE HYCLATE 100 MG PO TABS
100.0000 mg | ORAL_TABLET | Freq: Once | ORAL | Status: AC
  Administered 2016-07-19: 100 mg via ORAL
  Filled 2016-07-19: qty 1

## 2016-07-19 MED ORDER — MAGNESIUM SULFATE 2 GM/50ML IV SOLN
2.0000 g | Freq: Once | INTRAVENOUS | Status: AC
  Administered 2016-07-19: 2 g via INTRAVENOUS
  Filled 2016-07-19: qty 50

## 2016-07-19 MED ORDER — ALBUTEROL SULFATE (2.5 MG/3ML) 0.083% IN NEBU
2.5000 mg | INHALATION_SOLUTION | Freq: Three times a day (TID) | RESPIRATORY_TRACT | Status: DC
  Administered 2016-07-20 – 2016-07-22 (×7): 2.5 mg via RESPIRATORY_TRACT
  Filled 2016-07-19 (×7): qty 3

## 2016-07-19 MED ORDER — DULOXETINE HCL 30 MG PO CPEP
60.0000 mg | ORAL_CAPSULE | Freq: Two times a day (BID) | ORAL | Status: DC
  Administered 2016-07-19 – 2016-07-22 (×6): 60 mg via ORAL
  Filled 2016-07-19 (×6): qty 2

## 2016-07-19 MED ORDER — POTASSIUM CHLORIDE IN NACL 40-0.9 MEQ/L-% IV SOLN
INTRAVENOUS | Status: AC
  Administered 2016-07-19: 50 mL/h via INTRAVENOUS
  Filled 2016-07-19 (×2): qty 1000

## 2016-07-19 MED ORDER — HEPARIN SODIUM (PORCINE) 5000 UNIT/ML IJ SOLN
5000.0000 [IU] | Freq: Three times a day (TID) | INTRAMUSCULAR | Status: DC
  Administered 2016-07-19 – 2016-07-22 (×8): 5000 [IU] via SUBCUTANEOUS
  Filled 2016-07-19 (×8): qty 1

## 2016-07-19 MED ORDER — DOXYCYCLINE HYCLATE 100 MG PO TABS
100.0000 mg | ORAL_TABLET | Freq: Two times a day (BID) | ORAL | Status: DC
  Administered 2016-07-19 – 2016-07-22 (×6): 100 mg via ORAL
  Filled 2016-07-19 (×6): qty 1

## 2016-07-19 MED ORDER — NICOTINE 14 MG/24HR TD PT24
14.0000 mg | MEDICATED_PATCH | Freq: Every day | TRANSDERMAL | Status: DC
  Administered 2016-07-19 – 2016-07-22 (×4): 14 mg via TRANSDERMAL
  Filled 2016-07-19 (×4): qty 1

## 2016-07-19 MED ORDER — METHYLPREDNISOLONE SODIUM SUCC 125 MG IJ SOLR
125.0000 mg | Freq: Once | INTRAMUSCULAR | Status: AC
  Administered 2016-07-19: 125 mg via INTRAVENOUS
  Filled 2016-07-19: qty 2

## 2016-07-19 MED ORDER — IPRATROPIUM-ALBUTEROL 0.5-2.5 (3) MG/3ML IN SOLN
3.0000 mL | Freq: Four times a day (QID) | RESPIRATORY_TRACT | Status: DC
  Administered 2016-07-19: 3 mL via RESPIRATORY_TRACT
  Filled 2016-07-19: qty 3

## 2016-07-19 MED ORDER — ALBUTEROL SULFATE (2.5 MG/3ML) 0.083% IN NEBU
5.0000 mg | INHALATION_SOLUTION | Freq: Once | RESPIRATORY_TRACT | Status: AC
  Administered 2016-07-19: 5 mg via RESPIRATORY_TRACT
  Filled 2016-07-19: qty 6

## 2016-07-19 MED ORDER — ALBUTEROL SULFATE (2.5 MG/3ML) 0.083% IN NEBU: 2.5000 mg | INHALATION_SOLUTION | RESPIRATORY_TRACT | Status: DC | PRN

## 2016-07-19 MED ORDER — GABAPENTIN 400 MG PO CAPS
800.0000 mg | ORAL_CAPSULE | Freq: Three times a day (TID) | ORAL | Status: DC
  Administered 2016-07-19 – 2016-07-22 (×8): 800 mg via ORAL
  Filled 2016-07-19 (×8): qty 2

## 2016-07-19 MED ORDER — IPRATROPIUM BROMIDE 0.02 % IN SOLN
1.0000 mg | Freq: Once | RESPIRATORY_TRACT | Status: AC
  Administered 2016-07-19: 1 mg via RESPIRATORY_TRACT

## 2016-07-19 MED ORDER — PREDNISONE 20 MG PO TABS
60.0000 mg | ORAL_TABLET | Freq: Once | ORAL | Status: AC
  Administered 2016-07-19: 60 mg via ORAL
  Filled 2016-07-19: qty 3

## 2016-07-19 MED ORDER — MOMETASONE FURO-FORMOTEROL FUM 200-5 MCG/ACT IN AERO
2.0000 | INHALATION_SPRAY | Freq: Two times a day (BID) | RESPIRATORY_TRACT | Status: DC
  Administered 2016-07-19 – 2016-07-22 (×6): 2 via RESPIRATORY_TRACT
  Filled 2016-07-19: qty 8.8

## 2016-07-19 MED ORDER — TIOTROPIUM BROMIDE MONOHYDRATE 18 MCG IN CAPS
18.0000 ug | ORAL_CAPSULE | Freq: Every day | RESPIRATORY_TRACT | Status: DC
  Administered 2016-07-20 – 2016-07-22 (×3): 18 ug via RESPIRATORY_TRACT
  Filled 2016-07-19: qty 5

## 2016-07-19 MED ORDER — BENZONATATE 100 MG PO CAPS: 100.0000 mg | ORAL_CAPSULE | Freq: Three times a day (TID) | ORAL | Status: DC | PRN

## 2016-07-19 MED ORDER — IPRATROPIUM BROMIDE 0.02 % IN SOLN
0.5000 mg | Freq: Once | RESPIRATORY_TRACT | Status: AC
  Administered 2016-07-19: 0.5 mg via RESPIRATORY_TRACT
  Filled 2016-07-19: qty 2.5

## 2016-07-19 MED ORDER — ALBUTEROL (5 MG/ML) CONTINUOUS INHALATION SOLN
10.0000 mg/h | INHALATION_SOLUTION | Freq: Once | RESPIRATORY_TRACT | Status: AC
  Administered 2016-07-19: 10 mg/h via RESPIRATORY_TRACT
  Filled 2016-07-19: qty 20

## 2016-07-19 MED ORDER — PANTOPRAZOLE SODIUM 40 MG PO TBEC
40.0000 mg | DELAYED_RELEASE_TABLET | Freq: Every day | ORAL | Status: DC
  Administered 2016-07-20 – 2016-07-22 (×3): 40 mg via ORAL
  Filled 2016-07-19 (×3): qty 1

## 2016-07-19 MED ORDER — TIZANIDINE HCL 4 MG PO TABS
4.0000 mg | ORAL_TABLET | Freq: Two times a day (BID) | ORAL | Status: DC
  Administered 2016-07-19 – 2016-07-22 (×6): 4 mg via ORAL
  Filled 2016-07-19 (×6): qty 1

## 2016-07-19 MED ORDER — DM-GUAIFENESIN ER 30-600 MG PO TB12
1.0000 | ORAL_TABLET | Freq: Two times a day (BID) | ORAL | Status: DC
  Administered 2016-07-19 – 2016-07-22 (×6): 1 via ORAL
  Filled 2016-07-19 (×6): qty 1

## 2016-07-19 MED ORDER — METHADONE HCL 10 MG/ML PO CONC
110.0000 mg | Freq: Every day | ORAL | Status: DC
  Administered 2016-07-20: 110 mg via ORAL
  Filled 2016-07-19 (×2): qty 11

## 2016-07-19 MED ORDER — METHYLPREDNISOLONE SODIUM SUCC 125 MG IJ SOLR
80.0000 mg | Freq: Three times a day (TID) | INTRAMUSCULAR | Status: DC
  Administered 2016-07-19 – 2016-07-21 (×7): 80 mg via INTRAVENOUS
  Filled 2016-07-19 (×8): qty 2

## 2016-07-19 NOTE — ED Notes (Signed)
Hospitalist at bedside 

## 2016-07-19 NOTE — H&P (Signed)
TRH H&P   Patient Demographics:    Emily Moran, is a 60 y.o. female  MRN: [id number]   DOB - July 17, 1956  Admit Date - 07/19/2016  Outpatient Primary MD for the patient is Philis Fendt, MD  Referring MD/NP/PA: Dr Bland Span  Patient coming from: Home  Chief Complaint  Patient presents with  . Cough  . Shortness of Breath      HPI:    Emily Moran  is a 60 y.o. female, with past medical history of COPD, history of drug abuse nothing over last 2 years, methadone, hypertension, COPD, chronic respiratory failure on 2.5 L nasal cannula, depression, anxiety, tobacco abuse, HCV, DDD, chronic neck pain and back pain, patient presents with complains of cough and shortness of breath, reports her shortness of breath over last 3 days, progressive, mainly exertional, with more wheezing require her to use inhaler more frequently, as well reports cough, productive, with white sputum turning into yellow, denies any fever or chills, no hemoptysis , no chest pain, in ED workup significant for COPD exacerbation, she had CT chest with significant emphysema, and new finding of lung nodule, she received oral prednisone, without improvement of her symptoms, she then received IV steroids, and hospitalist requested to admit    Review of systems:    In addition to the HPI above, No Fever-chills, No Headache, No changes with Vision or hearing, No problems swallowing food or Liquids, No Chest pain, coarse cough on shortness of breath No Abdominal pain, No Nausea or Vommitting, Bowel movements are regular, No Blood in stool or Urine, No dysuria, No new skin rashes or bruises, No new joints pains-aches,  No new weakness, tingling, numbness in any extremity, No recent weight gain or loss, No polyuria, polydypsia or polyphagia, No significant Mental Stressors.  A full 10 point Review of Systems was done,  except as stated above, all other Review of Systems were negative.   With Past History of the following :    Past Medical History:  Diagnosis Date  . Anemia    "as a child"  . Anxiety   . Arthritis    "shoulders; back" (03/16/2015)  . Chronic back pain   . Chronic bronchitis ([place])    "I basically keep it" (03/16/2015)  . Chronic pain   . COPD (chronic obstructive pulmonary disease) ([place])   . DDD (degenerative disc disease), lumbar   . Degenerative disc disease   . Depression   . Family history of adverse reaction to anesthesia    "my mother had an allergic reaction when she had kidney removed back in the '60's or '70's""  . Headache    "monthly; need glasses; comes on when I'm stressed or get too tired" (03/16/2015)  . Hepatitis C   . History of stomach ulcers   . Hypertension   . Kidney stone   . On home O2    "~  3L once/wk and prn" (03/16/2015)  . Pain management   . Pneumonia    "@ least once/yr for the past 8 yrs" (03/16/2015)  . Spinal stenosis   . Tobacco use       Past Surgical History:  Procedure Laterality Date  . ANTERIOR CERVICAL DECOMP/DISCECTOMY FUSION  2002  . BACK SURGERY    . FEMUR IM NAIL Right 11/28/2012   Procedure: INTRAMEDULLARY (IM) RETROGRADE FEMORAL NAILING wants jackson table , c-arm and biomet ;  Surgeon: Mauri Pole, MD;  Location: [facility];  Service: Orthopedics;  Laterality: Right;  . FRACTURE SURGERY    . INCONTINENCE SURGERY  1998  . TOTAL ABDOMINAL HYSTERECTOMY  1998      Social History:     Social History  Substance Use Topics  . Smoking status: Current Every Day Smoker    Packs/day: 0.25    Years: 36.00    Types: Cigarettes  . Smokeless tobacco: Never Used  . Alcohol use No     Comment: denies h/o alcohol abuse on 03/16/2015     Lives - at home  Mobility - independent     Family History :    family history significant for CAD and hypertension   Home Medications:   Prior to Admission medications   Medication Sig Start  Date End Date Taking? Authorizing Provider  albuterol (PROVENTIL HFA;VENTOLIN HFA) 108 (90 Base) MCG/ACT inhaler Inhale 2 puffs into the lungs every 6 (six) hours as needed for wheezing or shortness of breath. 04/08/16  Yes Debbe Odea, MD  albuterol (PROVENTIL) (2.5 MG/3ML) 0.083% nebulizer solution Take 3 mLs (2.5 mg total) by nebulization every 6 (six) hours as needed for wheezing or shortness of breath (buy this only if you cannot afford the Duoneb). 04/08/16  Yes Debbe Odea, MD  amLODipine (NORVASC) 10 MG tablet Take 1 tablet (10 mg total) by mouth daily. 04/08/16  Yes Debbe Odea, MD  DULoxetine (CYMBALTA) 60 MG capsule Take 60 mg by mouth 2 (two) times daily.   Yes Historical Provider, MD  Fluticasone-Salmeterol (ADVAIR DISKUS) 250-50 MCG/DOSE AEPB Inhale 1 puff into the lungs 2 (two) times daily. 04/08/16  Yes Debbe Odea, MD  gabapentin (NEURONTIN) 800 MG tablet Take 800 mg by mouth 3 (three) times daily.   Yes Historical Provider, MD  methadone (DOLOPHINE) 10 MG/ML solution Take 140 mg by mouth daily.    Yes Historical Provider, MD  Respiratory Therapy Supplies (NEBULIZER) DEVI Nebulizer for home use. 08/17/15  Yes Larene Pickett, PA-C  tiotropium (SPIRIVA HANDIHALER) 18 MCG inhalation capsule Place 1 capsule (18 mcg total) into inhaler and inhale daily. 04/08/16  Yes Debbe Odea, MD  tiZANidine (ZANAFLEX) 4 MG tablet Take 4 mg by mouth 2 (two) times daily. 07/18/16  Yes Historical Provider, MD  amoxicillin-clavulanate (AUGMENTIN) 875-125 MG tablet Take 1 tablet by mouth 2 (two) times daily. Patient not taking: Reported on 07/19/2016 04/08/16   Debbe Odea, MD  benzonatate (TESSALON) 100 MG capsule Take 1 capsule (100 mg total) by mouth 3 (three) times daily as needed for cough. Patient not taking: Reported on 07/19/2016 04/08/16   Debbe Odea, MD  dextromethorphan-guaiFENesin [facility] DM) 30-600 MG 12hr tablet Take 1 tablet by mouth 2 (two) times daily. Patient not taking: Reported on 07/19/2016  04/08/16   Debbe Odea, MD  doxycycline (VIBRA-TABS) 100 MG tablet Take 1 tablet (100 mg total) by mouth every 12 (twelve) hours. Patient not taking: Reported on 07/19/2016 04/08/16   Debbe Odea, MD  ipratropium-albuterol (  DUONEB) 0.5-2.5 (3) MG/3ML SOLN Take 3 mLs by nebulization every 6 (six) hours as needed. Patient not taking: Reported on 07/19/2016 04/08/16   Debbe Odea, MD  nicotine (NICODERM CQ) 7 mg/24hr patch Place 1 patch (7 mg total) onto the skin daily. Patient not taking: Reported on 07/19/2016 04/08/16   Debbe Odea, MD  ondansetron (ZOFRAN) 4 MG tablet Take 1 tablet (4 mg total) by mouth every 4 (four) hours as needed for nausea or vomiting. Replace with the ODT if it is covered by insurance Patient not taking: Reported on 07/19/2016 04/08/16   Debbe Odea, MD     Allergies:     Allergies  Allergen Reactions  . Aspirin     Upset stomach     Physical Exam:   Vitals  Blood pressure 153/87, pulse 79, temperature 98.1 F (36.7 C), temperature source Oral, resp. rate 19, height '5\' 6"'$  (1.676 m), weight 61.2 kg (135 lb), SpO2 94 %.   1. General in appearing female lying in bed in NAD,    2. Normal affect and insight, Not Suicidal or Homicidal, Awake Alert, Oriented X 3.  3. No F.N deficits, ALL C.Nerves Intact, Strength 5/5 all 4 extremities, Sensation intact all 4 extremities, Plantars down going.  4. Ears and Eyes appear Normal, Conjunctivae clear, PERRLA. Moist Oral Mucosa,presentation  5. Supple Neck, No JVD, No cervical lymphadenopathy appriciated, No Carotid Bruits.  6. Symmetrical Chest wall movement, Good air movement bilaterally, scattered wheezing  7. RRR, No Gallops, Rubs or Murmurs, No Parasternal Heave.  8. Positive Bowel Sounds, Abdomen Soft, No tenderness, No organomegaly appriciated,No rebound -guarding or rigidity.  9.  No Cyanosis, Normal Skin Turgor, No Skin Rash or Bruise.  10. Good muscle tone,  joints appear normal , no effusions, Normal  ROM.  11. No Palpable Lymph Nodes in Neck or Axillae    Data Review:    CBC  Recent Labs Lab 07/19/16 1418 07/19/16 1520  WBC 14.5*  --   HGB 13.5 14.6  HCT 39.9 43.0  PLT 265  --   MCV 90.5  --   MCH 30.6  --   MCHC 33.8  --   RDW 13.2  --   LYMPHSABS 2.5  --   MONOABS 1.0  --   EOSABS 0.4  --   BASOSABS 0.1  --    ------------------------------------------------------------------------------------------------------------------  Chemistries   Recent Labs Lab 07/19/16 1418 07/19/16 1520  NA 139 141  K 3.1* 3.3*  CL 106 104  CO2 25  --   GLUCOSE 86 85  BUN 6 7  CREATININE 1.05* 1.00  CALCIUM 8.7*  --    ------------------------------------------------------------------------------------------------------------------ estimated creatinine clearance is 57.4 mL/min (by C-G formula based on SCr of 1 mg/dL). ------------------------------------------------------------------------------------------------------------------ No results for input(s): TSH, T4TOTAL, T3FREE, THYROIDAB in the last 72 hours.  Invalid input(s): FREET3  Coagulation profile No results for input(s): INR, PROTIME in the last 168 hours. ------------------------------------------------------------------------------------------------------------------- No results for input(s): DDIMER in the last 72 hours. -------------------------------------------------------------------------------------------------------------------  Cardiac Enzymes No results for input(s): CKMB, TROPONINI, MYOGLOBIN in the last 168 hours.  Invalid input(s): CK ------------------------------------------------------------------------------------------------------------------    Component Value Date/Time   BNP 62.6 04/06/2016 0208     ---------------------------------------------------------------------------------------------------------------  Urinalysis    Component Value Date/Time   COLORURINE AMBER (A) 04/05/2016  2127   APPEARANCEUR CLOUDY (A) 04/05/2016 2127   LABSPEC 1.021 04/05/2016 2127   PHURINE 5.5 04/05/2016 2127   GLUCOSEU NEGATIVE 04/05/2016 2127   HGBUR TRACE (A) 04/05/2016 2127  BILIRUBINUR SMALL (A) 04/05/2016 2127   KETONESUR NEGATIVE 04/05/2016 2127   PROTEINUR 100 (A) 04/05/2016 2127   UROBILINOGEN 4.0 (H) 06/03/2012 1648   NITRITE NEGATIVE 04/05/2016 2127   LEUKOCYTESUR TRACE (A) 04/05/2016 2127    ----------------------------------------------------------------------------------------------------------------   Imaging Results:    Dg Chest 2 View  Result Date: 07/19/2016 CLINICAL DATA:  60 year old female with pneumonia 3 months ago. Persistent productive cough. Current smoker. Subsequent encounter. EXAM: CHEST  2 VIEW COMPARISON:  04/06/2016, 03/07/2016 and 03/16/2015 chest x-ray. FINDINGS: Marked emphysema with large bullae/ blebs right mid to upper lobe. Chronic lung changes. Additionally, when compared to several prior examination, wedge-shaped opacities best appreciated on lateral view are noted. Given patient's history of persistent cough and smoking, CT the chest is recommended for further delineation to determine if there may be an underlying mass. Calcified slightly tortuous aorta. Heart size within normal limits. IMPRESSION: Marked emphysema with large bullae/ blebs right mid to upper lobe. When compared to several prior examination, wedge-shaped opacities best appreciated on lateral view are noted. Given patient's history of persistent cough and smoking, chest CT is recommended for further delineation to determine if there may be an underlying mass. Electronically Signed   By: Genia Del M.D.   On: 07/19/2016 13:29   Ct Chest Wo Contrast  Result Date: 07/19/2016 CLINICAL DATA:  Increasing shortness of breath.  COPD. EXAM: CT CHEST WITHOUT CONTRAST TECHNIQUE: Multidetector CT imaging of the chest was performed following the standard protocol without IV contrast.  COMPARISON:  05/07/2014 FINDINGS: Cardiovascular: Normal heart size. Aortic atherosclerosis noted. Calcification within the LAD coronary artery noted. Mediastinum/Nodes: No axillary or supraclavicular adenopathy. No mediastinal or hilar adenopathy. The trachea appears patent and is midline. Normal appearance of the esophagus. Lungs/Pleura: Advanced bullous emphysema identified within both lungs. Diffuse bronchial wall thickening. Chronic progressive subsegmental atelectasis involving the anterior medial right middle lobe is identified, image number 114 of series 7. Triangular-shaped density within the posterior perifissural left upper lobe. This measures 2.7 x 0.8 cm, image 74 of series 7. Previous 2.4 x 1.1 cm. There is a new focal area of nodular consolidate within the anterior right upper lobe, image number 80 of series 7. This measures 3.2 x 1.3 cm (mean diameter 2.2 cm), image 80 of series 7. Stable scar like density within the lingular portion of the left lung, image 121 of series 7. Within the posterior medial left lower lobe there is a focal area of bronchiectasis and tree-in-bud nodularity, image 104 of series 7, likely the sequelae of previous inflammation/aspiration. Upper Abdomen: No acute abnormality. Musculoskeletal: No chest wall mass or suspicious bone lesions identified. IMPRESSION: 1. Marked advanced smoking related changes including bullous emphysema and diffuse bronchial wall thickening. 2. Progressive subsegmental atelectasis within the right middle lobe compared with 05/07/2014. 3. New nodular density within the anterior right upper lobe has a mean diameter of 2.2 cm. Recommend followup imaging in 3 months to ensure resolution. 4. Stable perifissural scarring within the posterior right upper lobe. 5. Focal area of bronchiectasis and tree-in-bud nodularity within the posterior medial left lower lobe is likely the sequelae of inflammation and/or aspiration. 6. Aortic atherosclerosis and coronary  artery calcifications. Electronically Signed   By: Kerby Moors M.D.   On: 07/19/2016 14:11       Assessment & Plan:    Active Problems:   Hepatitis C   TOBACCO ABUSE   HTN (hypertension)   Chronic pain syndrome   COPD exacerbation (HCC)   Lung nodule  COPD exacerbation - Patient presents with cough, significant wheezing, shortness of breath, CT chest showing advanced COPD - We'll start on IV solu-Medrol and as tolerated, cont with dulera, spiriva, nebs, and Mucinex, given productive cough will start on doxycycline  Lung nodule -  Finding of lung nodule and CT chest, she will need repeat CT chest in 3 months for follow-up, patient understands importance of follow-up   chronic pain syndrome - Patient with history of substance abuse, heroin in the past, but nothing over last 2 years, still uses marijuana occasionally, last methadone dose On our record is110 mg in our record, she reports it was increased 140 mg recently, will have pharmacy call and confirm new dose in a.m., meanwhile will continue on 110 mg.  Tobacco abuse - Was counseled, reports she is planning to quit soon, continue nicotine patch  Hepatitis C - She is following with Dr. Paulita Fujita  DVT Prophylaxis Heparin -   SCDs  AM Labs Ordered, also please review Full Orders  Family Communication: Admission, patients condition and plan of care including tests being ordered have been discussed with the patient  who indicate understanding and agree with the plan and Code Status.  Code Status Full  Likely DC to  Home  Condition GUARDED    Consults called: none  Admission status: Inpatient  Time spent in minutes : 55 minutes   Casimir Barcellos M.D on 07/19/2016 at 4:50 PM  Between 7am to 7pm - Pager - [phone]. After 7pm go to [email or web address] - password [facility]  [facility] - Office  [phone]

## 2016-07-19 NOTE — Progress Notes (Signed)
RN received report from ED, Pt arrived unit, alert and oriented, able to communicate needs. MD notified of Pt's location. Will continue with current plan of care.

## 2016-07-19 NOTE — ED Notes (Signed)
Patient transported to CT. Labs to be drawn when patient returns.

## 2016-07-19 NOTE — ED Provider Notes (Signed)
[place] DEPT Provider Note   CSN: [id number] Arrival date & time: 07/19/16  1148     History   Chief Complaint Chief Complaint  Patient presents with  . Cough  . Shortness of Breath    HPI Emily Moran is a 60 y.o. female.  HPI 60 year old female with past medical history of COPD and hepatitis C who presents with shortness of breath for 4 days. The patient states that she has chronic shortness of breath that has acutely worsened over the last 4 days. She intermittently uses oxygen as needed but states she has had to use it all the time over the last several days. She also endorses increased yellow-green sputum production. She states that over the last 24 hours, she has been using her inhaler without significant improvement. She has become significant and short of breath at rest and subsequently presents for evaluation. She denies any fevers and she does have a history of community acquired pneumonia with similar symptoms in the past. Denies any alleviating factors beyond transient relief with her inhalers.  Past Medical History:  Diagnosis Date  . Anemia    "as a child"  . Anxiety   . Arthritis    "shoulders; back" (03/16/2015)  . Chronic back pain   . Chronic bronchitis ([place])    "I basically keep it" (03/16/2015)  . Chronic pain   . COPD (chronic obstructive pulmonary disease) ([place])   . DDD (degenerative disc disease), lumbar   . Degenerative disc disease   . Depression   . Family history of adverse reaction to anesthesia    "my mother had an allergic reaction when she had kidney removed back in the '60's or '70's""  . Headache    "monthly; need glasses; comes on when I'm stressed or get too tired" (03/16/2015)  . Hepatitis C   . History of stomach ulcers   . Hypertension   . Kidney stone   . On home O2    "~ 3L once/wk and prn" (03/16/2015)  . Pain management   . Pneumonia    "@ least once/yr for the past 8 yrs" (03/16/2015)  . Spinal stenosis   . Tobacco use      Patient Active Problem List   Diagnosis Date Noted  . COPD exacerbation ([place]) 07/19/2016  . Lung nodule 07/19/2016  . Septic shock ([place]) 04/05/2016  . HCAP (healthcare-associated pneumonia) 04/05/2016  . CAP (community acquired pneumonia) 03/07/2016  . Acute respiratory failure with hypoxia ([place]) 03/16/2015  . COPD (chronic obstructive pulmonary disease) ([place]) 03/16/2015  . HTN (hypertension) 03/16/2015  . Leukocytosis 03/16/2015  . Hyperglycemia 03/16/2015  . Chronic pain syndrome 03/16/2015  . Hepatitis C 12/18/2007  . TOBACCO ABUSE 09/11/2007  . Substance abuse 09/11/2007    Past Surgical History:  Procedure Laterality Date  . ANTERIOR CERVICAL DECOMP/DISCECTOMY FUSION  2002  . BACK SURGERY    . FEMUR IM NAIL Right 11/28/2012   Procedure: INTRAMEDULLARY (IM) RETROGRADE FEMORAL NAILING wants jackson table , c-arm and biomet ;  Surgeon: Mauri Pole, MD;  Location: [facility]S;  Service: Orthopedics;  Laterality: Right;  . FRACTURE SURGERY    . INCONTINENCE SURGERY  1998  . TOTAL ABDOMINAL HYSTERECTOMY  1998    OB History    No data available       Home Medications    Prior to Admission medications   Medication Sig Start Date End Date Taking? Authorizing Provider  albuterol (PROVENTIL HFA;VENTOLIN HFA) 108 (90 Base) MCG/ACT inhaler Inhale  2 puffs into the lungs every 6 (six) hours as needed for wheezing or shortness of breath. 04/08/16  Yes Debbe Odea, MD  albuterol (PROVENTIL) (2.5 MG/3ML) 0.083% nebulizer solution Take 3 mLs (2.5 mg total) by nebulization every 6 (six) hours as needed for wheezing or shortness of breath (buy this only if you cannot afford the Duoneb). 04/08/16  Yes Debbe Odea, MD  amLODipine (NORVASC) 10 MG tablet Take 1 tablet (10 mg total) by mouth daily. 04/08/16  Yes Debbe Odea, MD  DULoxetine (CYMBALTA) 60 MG capsule Take 60 mg by mouth 2 (two) times daily.   Yes Historical Provider, MD  Fluticasone-Salmeterol (ADVAIR DISKUS) 250-50 MCG/DOSE AEPB  Inhale 1 puff into the lungs 2 (two) times daily. 04/08/16  Yes Debbe Odea, MD  gabapentin (NEURONTIN) 800 MG tablet Take 800 mg by mouth 3 (three) times daily.   Yes Historical Provider, MD  methadone (DOLOPHINE) 10 MG/ML solution Take 140 mg by mouth daily.    Yes Historical Provider, MD  Respiratory Therapy Supplies (NEBULIZER) DEVI Nebulizer for home use. 08/17/15  Yes Larene Pickett, PA-C  tiotropium (SPIRIVA HANDIHALER) 18 MCG inhalation capsule Place 1 capsule (18 mcg total) into inhaler and inhale daily. 04/08/16  Yes Debbe Odea, MD  tiZANidine (ZANAFLEX) 4 MG tablet Take 4 mg by mouth 2 (two) times daily. 07/18/16  Yes Historical Provider, MD  amoxicillin-clavulanate (AUGMENTIN) 875-125 MG tablet Take 1 tablet by mouth 2 (two) times daily. Patient not taking: Reported on 07/19/2016 04/08/16   Debbe Odea, MD  benzonatate (TESSALON) 100 MG capsule Take 1 capsule (100 mg total) by mouth 3 (three) times daily as needed for cough. Patient not taking: Reported on 07/19/2016 04/08/16   Debbe Odea, MD  dextromethorphan-guaiFENesin [facility] DM) 30-600 MG 12hr tablet Take 1 tablet by mouth 2 (two) times daily. Patient not taking: Reported on 07/19/2016 04/08/16   Debbe Odea, MD  doxycycline (VIBRA-TABS) 100 MG tablet Take 1 tablet (100 mg total) by mouth every 12 (twelve) hours. Patient not taking: Reported on 07/19/2016 04/08/16   Debbe Odea, MD  ipratropium-albuterol (DUONEB) 0.5-2.5 (3) MG/3ML SOLN Take 3 mLs by nebulization every 6 (six) hours as needed. Patient not taking: Reported on 07/19/2016 04/08/16   Debbe Odea, MD  nicotine (NICODERM CQ) 7 mg/24hr patch Place 1 patch (7 mg total) onto the skin daily. Patient not taking: Reported on 07/19/2016 04/08/16   Debbe Odea, MD  ondansetron (ZOFRAN) 4 MG tablet Take 1 tablet (4 mg total) by mouth every 4 (four) hours as needed for nausea or vomiting. Replace with the ODT if it is covered by insurance Patient not taking: Reported on 07/19/2016  04/08/16   Debbe Odea, MD    Family History No family history on file.  Social History Social History  Substance Use Topics  . Smoking status: Current Every Day Smoker    Packs/day: 0.25    Years: 36.00    Types: Cigarettes  . Smokeless tobacco: Never Used  . Alcohol use No     Comment: denies h/o alcohol abuse on 03/16/2015     Allergies   Aspirin   Review of Systems Review of Systems  Constitutional: Positive for chills and fatigue. Negative for fever.  HENT: Negative for congestion, rhinorrhea and sore throat.   Eyes: Negative for visual disturbance.  Respiratory: Positive for shortness of breath. Negative for cough and wheezing.   Cardiovascular: Negative for chest pain and leg swelling.  Gastrointestinal: Negative for abdominal pain, diarrhea, nausea and vomiting.  Genitourinary: Negative for dysuria, flank pain, vaginal bleeding and vaginal discharge.  Musculoskeletal: Negative for neck pain.  Skin: Negative for rash.  Allergic/Immunologic: Negative for immunocompromised state.  Neurological: Negative for syncope and headaches.  Hematological: Does not bruise/bleed easily.  All other systems reviewed and are negative.    Physical Exam Updated Vital Signs BP (!) 143/100 (BP Location: Right Arm)   Pulse (!) 104   Temp 97.9 F (36.6 C) (Oral)   Resp 20   Ht '5\' 6"'$  (1.676 m)   Wt 126 lb 12.8 oz (57.5 kg)   SpO2 90%   BMI 20.47 kg/m   Physical Exam  Constitutional: She is oriented to person, place, and time. She appears well-developed and well-nourished. She appears distressed.  HENT:  Head: Normocephalic and atraumatic.  Eyes: Conjunctivae are normal.  Neck: Neck supple.  Cardiovascular: Normal rate, regular rhythm and normal heart sounds.  Exam reveals no friction rub.   No murmur heard. Pulmonary/Chest: Tachypnea noted. She is in respiratory distress. She has decreased breath sounds. She has wheezes in the right upper field, the right middle field, the  right lower field, the left upper field, the left middle field and the left lower field. She has rhonchi in the right lower field and the left lower field. She has no rales.  Abdominal: She exhibits no distension.  Musculoskeletal: She exhibits no edema.  Neurological: She is alert and oriented to person, place, and time. She exhibits normal muscle tone.  Skin: Skin is warm. Capillary refill takes less than 2 seconds.  Psychiatric: She has a normal mood and affect.  Nursing note and vitals reviewed.    ED Treatments / Results  Labs (all labs ordered are listed, but only abnormal results are displayed) Labs Reviewed  CBC WITH DIFFERENTIAL/PLATELET - Abnormal; Notable for the following:       Result Value   WBC 14.5 (*)    Neutro Abs 10.6 (*)    All other components within normal limits  BASIC METABOLIC PANEL - Abnormal; Notable for the following:    Potassium 3.1 (*)    Creatinine, Ser 1.05 (*)    Calcium 8.7 (*)    GFR calc non Af Amer 57 (*)    All other components within normal limits  I-STAT CHEM 8, ED - Abnormal; Notable for the following:    Potassium 3.3 (*)    Calcium, Ion 1.06 (*)    All other components within normal limits  BASIC METABOLIC PANEL  CBC    EKG  EKG Interpretation  Date/Time:  Thursday July 19 2016 14:04:15 EDT Ventricular Rate:  74 PR Interval:    QRS Duration: 102 QT Interval:  423 QTC Calculation: 470 R Axis:   81 Text Interpretation:  Sinus rhythm RSR' in V1 or V2, probably normal variant No significant change since last tracing Confirmed by Naidelyn Parrella MD, Ivah Girardot [phone]) on 07/19/2016 7:12:33 PM       Radiology Dg Chest 2 View  Result Date: 07/19/2016 CLINICAL DATA:  60 year old female with pneumonia 3 months ago. Persistent productive cough. Current smoker. Subsequent encounter. EXAM: CHEST  2 VIEW COMPARISON:  04/06/2016, 03/07/2016 and 03/16/2015 chest x-ray. FINDINGS: Marked emphysema with large bullae/ blebs right mid to upper lobe.  Chronic lung changes. Additionally, when compared to several prior examination, wedge-shaped opacities best appreciated on lateral view are noted. Given patient's history of persistent cough and smoking, CT the chest is recommended for further delineation to determine if there may be an underlying mass.  Calcified slightly tortuous aorta. Heart size within normal limits. IMPRESSION: Marked emphysema with large bullae/ blebs right mid to upper lobe. When compared to several prior examination, wedge-shaped opacities best appreciated on lateral view are noted. Given patient's history of persistent cough and smoking, chest CT is recommended for further delineation to determine if there may be an underlying mass. Electronically Signed   By: Genia Del M.D.   On: 07/19/2016 13:29   Ct Chest Wo Contrast  Result Date: 07/19/2016 CLINICAL DATA:  Increasing shortness of breath.  COPD. EXAM: CT CHEST WITHOUT CONTRAST TECHNIQUE: Multidetector CT imaging of the chest was performed following the standard protocol without IV contrast. COMPARISON:  05/07/2014 FINDINGS: Cardiovascular: Normal heart size. Aortic atherosclerosis noted. Calcification within the LAD coronary artery noted. Mediastinum/Nodes: No axillary or supraclavicular adenopathy. No mediastinal or hilar adenopathy. The trachea appears patent and is midline. Normal appearance of the esophagus. Lungs/Pleura: Advanced bullous emphysema identified within both lungs. Diffuse bronchial wall thickening. Chronic progressive subsegmental atelectasis involving the anterior medial right middle lobe is identified, image number 114 of series 7. Triangular-shaped density within the posterior perifissural left upper lobe. This measures 2.7 x 0.8 cm, image 74 of series 7. Previous 2.4 x 1.1 cm. There is a new focal area of nodular consolidate within the anterior right upper lobe, image number 80 of series 7. This measures 3.2 x 1.3 cm (mean diameter 2.2 cm), image 80 of  series 7. Stable scar like density within the lingular portion of the left lung, image 121 of series 7. Within the posterior medial left lower lobe there is a focal area of bronchiectasis and tree-in-bud nodularity, image 104 of series 7, likely the sequelae of previous inflammation/aspiration. Upper Abdomen: No acute abnormality. Musculoskeletal: No chest wall mass or suspicious bone lesions identified. IMPRESSION: 1. Marked advanced smoking related changes including bullous emphysema and diffuse bronchial wall thickening. 2. Progressive subsegmental atelectasis within the right middle lobe compared with 05/07/2014. 3. New nodular density within the anterior right upper lobe has a mean diameter of 2.2 cm. Recommend followup imaging in 3 months to ensure resolution. 4. Stable perifissural scarring within the posterior right upper lobe. 5. Focal area of bronchiectasis and tree-in-bud nodularity within the posterior medial left lower lobe is likely the sequelae of inflammation and/or aspiration. 6. Aortic atherosclerosis and coronary artery calcifications. Electronically Signed   By: Kerby Moors M.D.   On: 07/19/2016 14:11    Procedures Procedures (including critical care time)  Medications Ordered in ED Medications  dextromethorphan-guaiFENesin (MUCINEX DM) 30-600 MG per 12 hr tablet 1 tablet (not administered)  benzonatate (TESSALON) capsule 100 mg (not administered)  tiZANidine (ZANAFLEX) tablet 4 mg (not administered)  albuterol (PROVENTIL) (2.5 MG/3ML) 0.083% nebulizer solution 2.5 mg (not administered)  tiotropium (SPIRIVA) inhalation capsule 18 mcg (not administered)  mometasone-formoterol (DULERA) 200-5 MCG/ACT inhaler 2 puff (not administered)  methadone (DOLOPHINE) 10 MG/ML solution 110 mg (not administered)  gabapentin (NEURONTIN) capsule 800 mg (not administered)  DULoxetine (CYMBALTA) DR capsule 60 mg (not administered)  methylPREDNISolone sodium succinate (SOLU-MEDROL) 125 mg/2 mL  injection 80 mg (not administered)  ipratropium-albuterol (DUONEB) 0.5-2.5 (3) MG/3ML nebulizer solution 3 mL (not administered)  heparin injection 5,000 Units (not administered)  0.9 % NaCl with KCl 40 mEq / L  infusion (not administered)  pantoprazole (PROTONIX) EC tablet 40 mg (not administered)  nicotine (NICODERM CQ - dosed in mg/24 hours) patch 14 mg (14 mg Transdermal Patch Applied 07/19/16 1803)  doxycycline (VIBRA-TABS) tablet 100 mg (not  administered)  predniSONE (DELTASONE) tablet 60 mg (60 mg Oral Given 07/19/16 1347)  doxycycline (VIBRA-TABS) tablet 100 mg (100 mg Oral Given 07/19/16 1347)  albuterol (PROVENTIL) (2.5 MG/3ML) 0.083% nebulizer solution 5 mg (5 mg Nebulization Given 07/19/16 1359)  ipratropium (ATROVENT) nebulizer solution 0.5 mg (0.5 mg Nebulization Given 07/19/16 1359)  albuterol (PROVENTIL,VENTOLIN) solution continuous neb (10 mg/hr Nebulization Given 07/19/16 1531)  ipratropium (ATROVENT) nebulizer solution 1 mg (1 mg Nebulization Given 07/19/16 1531)  magnesium sulfate IVPB 2 g 50 mL (0 g Intravenous Stopped 07/19/16 1700)  methylPREDNISolone sodium succinate (SOLU-MEDROL) 125 mg/2 mL injection 125 mg (125 mg Intravenous Given 07/19/16 1644)     Initial Impression / Assessment and Plan / ED Course  I have reviewed the triage vital signs and the nursing notes.  Pertinent labs & imaging results that were available during my care of the patient were reviewed by me and considered in my medical decision making (see chart for details).  Clinical Course  Value Comment By Time  DG Chest 2 View (Reviewed) Duffy Bruce, MD 10/62 714  60 year old female with past medical history of COPD on intermittent O2 as needed who presents with significant shortness of breath, sputum production, and wheezing. On arrival, patient in moderate respiratory distress with tachypnea and tachycardia. She is satting 88-90% on 2 L cannula. This is increased O2 requirement from her  baseline. Chest x-ray obtained and shows possible nodule. No focal pneumonia but radiology recommend CT for assessment of possible underlying lung cancer. CT scan was obtained which shows nodule but no evidence of significant pneumonia or lung cancer. She has severe emphysematous changes of her lungs. Lab work is otherwise unremarkable with exception of mild leukocytosis, likely reactive. Will start breathing treatments, prednisone, and doxycycline for COPD exacerbation with sputum production.   Patient endorses significant, persistent shortness of breath despite multiple breathing treatments. She remains tachypnea. Discussed outpatient versus inpatient management. Patient states she does not focal trouble going home and remains dyspneic at rest. Will subsequently admit for ongoing management of COPD exacerbation.  Final Clinical Impressions(s) / ED Diagnoses   Final diagnoses:  COPD exacerbation [facility])    New Prescriptions Current Discharge Medication List       Duffy Bruce, MD 07/19/16 1912

## 2016-07-19 NOTE — ED Triage Notes (Signed)
Patient presents to ED from home with increasing SOB x4 days.  Patient has COPD and states she is always "a little" short of breath and uses O2 via  at night.  Patient also c/o weakness and cough productive of sputum.  Patient endorses nausea and "a little" vomiting, but denies fever and diarrhea.  Patient uses Advair for her COPD and has an albuterol inhaler for rescue.  Patient also has a hx of CAP per chart review.

## 2016-07-20 DIAGNOSIS — J441 Chronic obstructive pulmonary disease with (acute) exacerbation: Principal | ICD-10-CM

## 2016-07-20 LAB — CBC
HEMATOCRIT: 39 % (ref 36.0–46.0)
Hemoglobin: 12.6 g/dL (ref 12.0–15.0)
MCH: 29.9 pg (ref 26.0–34.0)
MCHC: 32.3 g/dL (ref 30.0–36.0)
MCV: 92.4 fL (ref 78.0–100.0)
Platelets: 265 10*3/uL (ref 150–400)
RBC: 4.22 MIL/uL (ref 3.87–5.11)
RDW: 13.3 % (ref 11.5–15.5)
WBC: 19.3 10*3/uL — AB (ref 4.0–10.5)

## 2016-07-20 LAB — BASIC METABOLIC PANEL
Anion gap: 8 (ref 5–15)
BUN: 9 mg/dL (ref 6–20)
CHLORIDE: 108 mmol/L (ref 101–111)
CO2: 25 mmol/L (ref 22–32)
Calcium: 9.2 mg/dL (ref 8.9–10.3)
Creatinine, Ser: 1 mg/dL (ref 0.44–1.00)
GFR calc non Af Amer: >60 mL/min (ref >60)
Glucose, Bld: 99 mg/dL (ref 65–99)
POTASSIUM: 3.1 mmol/L — AB (ref 3.5–5.1)
SODIUM: 141 mmol/L (ref 135–145)

## 2016-07-20 LAB — MRSA PCR SCREENING: MRSA by PCR: POSITIVE — AB

## 2016-07-20 MED ORDER — CHLORHEXIDINE GLUCONATE CLOTH 2 % EX PADS
6.0000 | MEDICATED_PAD | Freq: Every day | CUTANEOUS | Status: DC
  Administered 2016-07-20 – 2016-07-22 (×3): 6 via TOPICAL

## 2016-07-20 MED ORDER — ACETAMINOPHEN 325 MG PO TABS
650.0000 mg | ORAL_TABLET | Freq: Four times a day (QID) | ORAL | Status: DC | PRN
  Administered 2016-07-20: 650 mg via ORAL
  Filled 2016-07-20: qty 2

## 2016-07-20 MED ORDER — METHADONE HCL 10 MG/ML PO CONC
140.0000 mg | Freq: Every day | ORAL | Status: DC
  Administered 2016-07-21 – 2016-07-22 (×2): 140 mg via ORAL
  Filled 2016-07-20 (×3): qty 14

## 2016-07-20 MED ORDER — MUPIROCIN 2 % EX OINT
1.0000 "application " | TOPICAL_OINTMENT | Freq: Two times a day (BID) | CUTANEOUS | Status: DC
  Administered 2016-07-20 – 2016-07-22 (×5): 1 via NASAL
  Filled 2016-07-20 (×2): qty 22

## 2016-07-20 MED ORDER — SENNA 8.6 MG PO TABS
1.0000 | ORAL_TABLET | Freq: Every day | ORAL | Status: DC
  Administered 2016-07-20 – 2016-07-22 (×3): 8.6 mg via ORAL
  Filled 2016-07-20 (×3): qty 1

## 2016-07-20 MED ORDER — METHADONE HCL 10 MG/ML PO CONC
30.0000 mg | Freq: Once | ORAL | Status: AC
  Administered 2016-07-20: 30 mg via ORAL
  Filled 2016-07-20: qty 3

## 2016-07-20 NOTE — Progress Notes (Signed)
PROGRESS NOTE    Emily Moran  WUJ:[id number] DOB: 1955/12/23 DOA: 07/19/2016 PCP: Philis Fendt, MD    Brief Narrative:  60 y/o presenting with copde after smoking.   Assessment & Plan:   COPD exacerbation ([place]) - continue current regimen - improving  Leukocytosis - Most likely due to steroid administration  Hypokalemia - replace orally  Active Problems:   Hepatitis C - stable   TOBACCO ABUSE   HTN (hypertension)   Chronic pain syndrome - stable  Constipation - Add Senna and most likely due to opiod regimen     Lung nodule - will need outpatient follow up, please see hpi    DVT prophylaxis:Heparin Code Status: Full Family Communication: None at bedside Disposition Plan: pending improvement in respiratory condition   Consultants:   None   Procedures: None   Antimicrobials: doxycycline   Subjective: Pt has no new complaints no acute issues overnight.  States she feels better today  Objective: Vitals:   07/20/16 0153 07/20/16 0744 07/20/16 1408 07/20/16 1418  BP: 127/79   132/75  Pulse: 91   84  Resp: 18   18  Temp: 98.8 F (37.1 C)   98 F (36.7 C)  TempSrc: Oral   Oral  SpO2: 91% 99% 91% 91%  Weight:      Height:        Intake/Output Summary (Last 24 hours) at 07/20/16 1455 Last data filed at 07/20/16 1230  Gross per 24 hour  Intake           918.33 ml  Output                0 ml  Net           918.33 ml   Filed Weights   07/19/16 1216 07/19/16 1728  Weight: 61.2 kg (135 lb) 57.5 kg (126 lb 12.8 oz)    Examination:  General exam: Appears calm and comfortable, in nad.  Respiratory system: prolonged exp phase, no wheezes, equal chest rise. Cardiovascular system: S1 & S2 heard, RRR. No JVD, murmurs, rubs, gallops or clicks.  Gastrointestinal system: Abdomen is nondistended, soft and nontender. No organomegaly or masses felt. Normal bowel sounds heard. Central nervous system: Alert and oriented. No focal neurological  deficits. Extremities: Symmetric 5 x 5 power. Skin: No rashes, lesions or ulcers, on limited exam. Psychiatry: Judgement and insight appear normal. Mood & affect appropriate.   Data Reviewed: I have personally reviewed following labs and imaging studies  CBC:  Recent Labs Lab 07/19/16 1418 07/19/16 1520 07/20/16 0606  WBC 14.5*  --  19.3*  NEUTROABS 10.6*  --   --   HGB 13.5 14.6 12.6  HCT 39.9 43.0 39.0  MCV 90.5  --  92.4  PLT 265  --  956   Basic Metabolic Panel:  Recent Labs Lab 07/19/16 1418 07/19/16 1520 07/20/16 0606  NA 139 141 141  K 3.1* 3.3* 3.1*  CL 106 104 108  CO2 25  --  25  GLUCOSE 86 85 99  BUN '6 7 9  '$ CREATININE 1.05* 1.00 1.00  CALCIUM 8.7*  --  9.2   GFR: Estimated Creatinine Clearance: 55.7 mL/min (by C-G formula based on SCr of 1 mg/dL). Liver Function Tests: No results for input(s): AST, ALT, ALKPHOS, BILITOT, PROT, ALBUMIN in the last 168 hours. No results for input(s): LIPASE, AMYLASE in the last 168 hours. No results for input(s): AMMONIA in the last 168 hours. Coagulation Profile: No results for  input(s): INR, PROTIME in the last 168 hours. Cardiac Enzymes: No results for input(s): CKTOTAL, CKMB, CKMBINDEX, TROPONINI in the last 168 hours. BNP (last 3 results) No results for input(s): PROBNP in the last 8760 hours. HbA1C: No results for input(s): HGBA1C in the last 72 hours. CBG: No results for input(s): GLUCAP in the last 168 hours. Lipid Profile: No results for input(s): CHOL, HDL, LDLCALC, TRIG, CHOLHDL, LDLDIRECT in the last 72 hours. Thyroid Function Tests: No results for input(s): TSH, T4TOTAL, FREET4, T3FREE, THYROIDAB in the last 72 hours. Anemia Panel: No results for input(s): VITAMINB12, FOLATE, FERRITIN, TIBC, IRON, RETICCTPCT in the last 72 hours. Sepsis Labs: No results for input(s): PROCALCITON, LATICACIDVEN in the last 168 hours.  Recent Results (from the past 240 hour(s))  MRSA PCR Screening     Status: Abnormal     Collection Time: 07/20/16  7:01 AM  Result Value Ref Range Status   MRSA by PCR POSITIVE (A) NEGATIVE Final    Comment:        The GeneXpert MRSA Assay (FDA approved for NASAL specimens only), is one component of a comprehensive MRSA colonization surveillance program. It is not intended to diagnose MRSA infection nor to guide or monitor treatment for MRSA infections. RESULT CALLED TO, READ BACK BY AND VERIFIED WITH: PINNIX,A RN AT 0957 ON 10.13.17 BY EPPERSON,S          Radiology Studies: Dg Chest 2 View  Result Date: 07/19/2016 CLINICAL DATA:  60 year old female with pneumonia 3 months ago. Persistent productive cough. Current smoker. Subsequent encounter. EXAM: CHEST  2 VIEW COMPARISON:  04/06/2016, 03/07/2016 and 03/16/2015 chest x-ray. FINDINGS: Marked emphysema with large bullae/ blebs right mid to upper lobe. Chronic lung changes. Additionally, when compared to several prior examination, wedge-shaped opacities best appreciated on lateral view are noted. Given patient's history of persistent cough and smoking, CT the chest is recommended for further delineation to determine if there may be an underlying mass. Calcified slightly tortuous aorta. Heart size within normal limits. IMPRESSION: Marked emphysema with large bullae/ blebs right mid to upper lobe. When compared to several prior examination, wedge-shaped opacities best appreciated on lateral view are noted. Given patient's history of persistent cough and smoking, chest CT is recommended for further delineation to determine if there may be an underlying mass. Electronically Signed   By: Genia Del M.D.   On: 07/19/2016 13:29   Ct Chest Wo Contrast  Result Date: 07/19/2016 CLINICAL DATA:  Increasing shortness of breath.  COPD. EXAM: CT CHEST WITHOUT CONTRAST TECHNIQUE: Multidetector CT imaging of the chest was performed following the standard protocol without IV contrast. COMPARISON:  05/07/2014 FINDINGS: Cardiovascular:  Normal heart size. Aortic atherosclerosis noted. Calcification within the LAD coronary artery noted. Mediastinum/Nodes: No axillary or supraclavicular adenopathy. No mediastinal or hilar adenopathy. The trachea appears patent and is midline. Normal appearance of the esophagus. Lungs/Pleura: Advanced bullous emphysema identified within both lungs. Diffuse bronchial wall thickening. Chronic progressive subsegmental atelectasis involving the anterior medial right middle lobe is identified, image number 114 of series 7. Triangular-shaped density within the posterior perifissural left upper lobe. This measures 2.7 x 0.8 cm, image 74 of series 7. Previous 2.4 x 1.1 cm. There is a new focal area of nodular consolidate within the anterior right upper lobe, image number 80 of series 7. This measures 3.2 x 1.3 cm (mean diameter 2.2 cm), image 80 of series 7. Stable scar like density within the lingular portion of the left lung, image 121 of series  7. Within the posterior medial left lower lobe there is a focal area of bronchiectasis and tree-in-bud nodularity, image 104 of series 7, likely the sequelae of previous inflammation/aspiration. Upper Abdomen: No acute abnormality. Musculoskeletal: No chest wall mass or suspicious bone lesions identified. IMPRESSION: 1. Marked advanced smoking related changes including bullous emphysema and diffuse bronchial wall thickening. 2. Progressive subsegmental atelectasis within the right middle lobe compared with 05/07/2014. 3. New nodular density within the anterior right upper lobe has a mean diameter of 2.2 cm. Recommend followup imaging in 3 months to ensure resolution. 4. Stable perifissural scarring within the posterior right upper lobe. 5. Focal area of bronchiectasis and tree-in-bud nodularity within the posterior medial left lower lobe is likely the sequelae of inflammation and/or aspiration. 6. Aortic atherosclerosis and coronary artery calcifications. Electronically Signed   By:  Kerby Moors M.D.   On: 07/19/2016 14:11        Scheduled Meds: . albuterol  2.5 mg Nebulization TID  . Chlorhexidine Gluconate Cloth  6 each Topical Q0600  . dextromethorphan-guaiFENesin  1 tablet Oral BID  . doxycycline  100 mg Oral Q12H  . DULoxetine  60 mg Oral BID  . gabapentin  800 mg Oral TID  . heparin  5,000 Units Subcutaneous Q8H  . methadone  110 mg Oral Daily  . [START ON 07/21/2016] methadone  140 mg Oral Daily  . methylPREDNISolone (SOLU-MEDROL) injection  80 mg Intravenous Q8H  . mometasone-formoterol  2 puff Inhalation BID  . mupirocin ointment  1 application Nasal BID  . nicotine  14 mg Transdermal Daily  . pantoprazole  40 mg Oral Daily  . senna  1 tablet Oral Daily  . tiotropium  18 mcg Inhalation Daily  . tiZANidine  4 mg Oral BID   Continuous Infusions: . 0.9 % NaCl with KCl 40 mEq / L 50 mL/hr (07/19/16 2114)     LOS: 1 day    Time spent: > 35 minutes    Velvet Bathe, MD [facility] Pager [phone]  If 7PM-7AM, please contact night-coverage [email or web address] Password [email or web address] 07/20/2016, 2:55 PM

## 2016-07-21 NOTE — Progress Notes (Signed)
PROGRESS NOTE    Emily Moran  MBW:[id number] DOB: Apr 21, 1956 DOA: 07/19/2016 PCP: Philis Fendt, MD    Brief Narrative:  60 y/o presenting with copde after smoking.   Assessment & Plan:   COPD exacerbation ([place]) - continue current regimen - improving  Leukocytosis - Most likely due to steroid administration  Hypokalemia - replace orally - Added K dur  Active Problems:   Hepatitis C - stable    TOBACCO ABUSE   HTN (hypertension)   Chronic pain syndrome - stable  Constipation - Add Senna and most likely due to opiod regimen     Lung nodule - will need outpatient follow up, please see hpi  DVT prophylaxis:Heparin Code Status: Full Family Communication: None at bedside Disposition Plan: pending improvement in respiratory condition, most likely d/c next am.   Consultants:   None   Procedures: None   Antimicrobials: doxycycline   Subjective: Pt has no new complaints. Not quite at baseline breathing status per patient.  Objective: Vitals:   07/20/16 2059 07/21/16 0606 07/21/16 0849 07/21/16 0850  BP: 138/84 (!) 122/110    Pulse: 86 87    Resp: 18 19    Temp: 98.4 F (36.9 C) 98.2 F (36.8 C)    TempSrc: Oral Oral    SpO2: 95% 96% 93% 93%  Weight:      Height:        Intake/Output Summary (Last 24 hours) at 07/21/16 1341 Last data filed at 07/21/16 1300  Gross per 24 hour  Intake              980 ml  Output                0 ml  Net              980 ml   Filed Weights   07/19/16 1216 07/19/16 1728  Weight: 61.2 kg (135 lb) 57.5 kg (126 lb 12.8 oz)    Examination:  General exam: Appears calm and comfortable, in nad.  Respiratory system: prolonged exp phase, no wheezes, equal chest rise, [place] in place Cardiovascular system: S1 & S2 heard, RRR. No JVD, murmurs, rubs, gallops or clicks.  Gastrointestinal system: Abdomen is nondistended, soft and nontender. No organomegaly or masses felt. Normal bowel sounds heard. Central nervous  system: Alert and oriented. No focal neurological deficits. Extremities: Symmetric 5 x 5 power. Skin: No rashes, lesions or ulcers, on limited exam. Psychiatry: Judgement and insight appear normal. Mood & affect appropriate.   Data Reviewed: I have personally reviewed following labs and imaging studies  CBC:  Recent Labs Lab 07/19/16 1418 07/19/16 1520 07/20/16 0606  WBC 14.5*  --  19.3*  NEUTROABS 10.6*  --   --   HGB 13.5 14.6 12.6  HCT 39.9 43.0 39.0  MCV 90.5  --  92.4  PLT 265  --  017   Basic Metabolic Panel:  Recent Labs Lab 07/19/16 1418 07/19/16 1520 07/20/16 0606  NA 139 141 141  K 3.1* 3.3* 3.1*  CL 106 104 108  CO2 25  --  25  GLUCOSE 86 85 99  BUN '6 7 9  '$ CREATININE 1.05* 1.00 1.00  CALCIUM 8.7*  --  9.2   GFR: Estimated Creatinine Clearance: 55.7 mL/min (by C-G formula based on SCr of 1 mg/dL). Liver Function Tests: No results for input(s): AST, ALT, ALKPHOS, BILITOT, PROT, ALBUMIN in the last 168 hours. No results for input(s): LIPASE, AMYLASE in the last 168  hours. No results for input(s): AMMONIA in the last 168 hours. Coagulation Profile: No results for input(s): INR, PROTIME in the last 168 hours. Cardiac Enzymes: No results for input(s): CKTOTAL, CKMB, CKMBINDEX, TROPONINI in the last 168 hours. BNP (last 3 results) No results for input(s): PROBNP in the last 8760 hours. HbA1C: No results for input(s): HGBA1C in the last 72 hours. CBG: No results for input(s): GLUCAP in the last 168 hours. Lipid Profile: No results for input(s): CHOL, HDL, LDLCALC, TRIG, CHOLHDL, LDLDIRECT in the last 72 hours. Thyroid Function Tests: No results for input(s): TSH, T4TOTAL, FREET4, T3FREE, THYROIDAB in the last 72 hours. Anemia Panel: No results for input(s): VITAMINB12, FOLATE, FERRITIN, TIBC, IRON, RETICCTPCT in the last 72 hours. Sepsis Labs: No results for input(s): PROCALCITON, LATICACIDVEN in the last 168 hours.  Recent Results (from the past 240  hour(s))  MRSA PCR Screening     Status: Abnormal   Collection Time: 07/20/16  7:01 AM  Result Value Ref Range Status   MRSA by PCR POSITIVE (A) NEGATIVE Final    Comment:        The GeneXpert MRSA Assay (FDA approved for NASAL specimens only), is one component of a comprehensive MRSA colonization surveillance program. It is not intended to diagnose MRSA infection nor to guide or monitor treatment for MRSA infections. RESULT CALLED TO, READ BACK BY AND VERIFIED WITH: PINNIX,A RN AT 0957 ON 10.13.17 BY EPPERSON,S          Radiology Studies: Ct Chest Wo Contrast  Result Date: 07/19/2016 CLINICAL DATA:  Increasing shortness of breath.  COPD. EXAM: CT CHEST WITHOUT CONTRAST TECHNIQUE: Multidetector CT imaging of the chest was performed following the standard protocol without IV contrast. COMPARISON:  05/07/2014 FINDINGS: Cardiovascular: Normal heart size. Aortic atherosclerosis noted. Calcification within the LAD coronary artery noted. Mediastinum/Nodes: No axillary or supraclavicular adenopathy. No mediastinal or hilar adenopathy. The trachea appears patent and is midline. Normal appearance of the esophagus. Lungs/Pleura: Advanced bullous emphysema identified within both lungs. Diffuse bronchial wall thickening. Chronic progressive subsegmental atelectasis involving the anterior medial right middle lobe is identified, image number 114 of series 7. Triangular-shaped density within the posterior perifissural left upper lobe. This measures 2.7 x 0.8 cm, image 74 of series 7. Previous 2.4 x 1.1 cm. There is a new focal area of nodular consolidate within the anterior right upper lobe, image number 80 of series 7. This measures 3.2 x 1.3 cm (mean diameter 2.2 cm), image 80 of series 7. Stable scar like density within the lingular portion of the left lung, image 121 of series 7. Within the posterior medial left lower lobe there is a focal area of bronchiectasis and tree-in-bud nodularity, image 104  of series 7, likely the sequelae of previous inflammation/aspiration. Upper Abdomen: No acute abnormality. Musculoskeletal: No chest wall mass or suspicious bone lesions identified. IMPRESSION: 1. Marked advanced smoking related changes including bullous emphysema and diffuse bronchial wall thickening. 2. Progressive subsegmental atelectasis within the right middle lobe compared with 05/07/2014. 3. New nodular density within the anterior right upper lobe has a mean diameter of 2.2 cm. Recommend followup imaging in 3 months to ensure resolution. 4. Stable perifissural scarring within the posterior right upper lobe. 5. Focal area of bronchiectasis and tree-in-bud nodularity within the posterior medial left lower lobe is likely the sequelae of inflammation and/or aspiration. 6. Aortic atherosclerosis and coronary artery calcifications. Electronically Signed   By: Kerby Moors M.D.   On: 07/19/2016 14:11  Scheduled Meds: . albuterol  2.5 mg Nebulization TID  . Chlorhexidine Gluconate Cloth  6 each Topical Q0600  . dextromethorphan-guaiFENesin  1 tablet Oral BID  . doxycycline  100 mg Oral Q12H  . DULoxetine  60 mg Oral BID  . gabapentin  800 mg Oral TID  . heparin  5,000 Units Subcutaneous Q8H  . methadone  140 mg Oral Daily  . methylPREDNISolone (SOLU-MEDROL) injection  80 mg Intravenous Q8H  . mometasone-formoterol  2 puff Inhalation BID  . mupirocin ointment  1 application Nasal BID  . nicotine  14 mg Transdermal Daily  . pantoprazole  40 mg Oral Daily  . senna  1 tablet Oral Daily  . tiotropium  18 mcg Inhalation Daily  . tiZANidine  4 mg Oral BID   Continuous Infusions:     LOS: 2 days   Time spent: > 35 minutes  Velvet Bathe, MD [facility] Pager [phone]  If 7PM-7AM, please contact night-coverage [email or web address] Password [email or web address] 07/21/2016, 1:41 PM

## 2016-07-22 DIAGNOSIS — I10 Essential (primary) hypertension: Secondary | ICD-10-CM

## 2016-07-22 MED ORDER — AMLODIPINE BESYLATE 5 MG PO TABS
2.5000 mg | ORAL_TABLET | Freq: Every day | ORAL | Status: DC
  Administered 2016-07-22 (×2): 2.5 mg via ORAL
  Filled 2016-07-22 (×2): qty 1

## 2016-07-22 MED ORDER — HYDRALAZINE HCL 20 MG/ML IJ SOLN: 10.0000 mg | Freq: Four times a day (QID) | INTRAMUSCULAR | Status: DC | PRN

## 2016-07-22 MED ORDER — AMLODIPINE BESYLATE 2.5 MG PO TABS: 2.5000 mg | ORAL_TABLET | Freq: Every day | ORAL | 0 refills | Status: DC

## 2016-07-22 MED ORDER — PREDNISONE 50 MG PO TABS: ORAL_TABLET | ORAL | 0 refills | Status: DC

## 2016-07-22 MED ORDER — DOXYCYCLINE HYCLATE 100 MG PO TABS: 100.0000 mg | ORAL_TABLET | Freq: Two times a day (BID) | ORAL | 0 refills | Status: DC

## 2016-07-22 NOTE — Discharge Summary (Signed)
Physician Discharge Summary  Emily Moran OXB:[id number] DOB: 12-Dec-1955 DOA: 07/19/2016  PCP: Philis Fendt, MD  Admit date: 07/19/2016 Discharge date: 07/22/2016  Time spent: > 35 minutes  Recommendations for Outpatient Follow-up:  Monitor K levels Reassess wbc levels  Lung nodule - will need outpatient follow up  Discharge Diagnoses:  Active Problems:   Hepatitis C   TOBACCO ABUSE   HTN (hypertension)   Chronic pain syndrome   COPD exacerbation (HCC)   Lung nodule   Discharge Condition: stable  Diet recommendation: Regular diet  Filed Weights   07/19/16 1216 07/19/16 1728  Weight: 61.2 kg (135 lb) 57.5 kg (126 lb 12.8 oz)    History of present illness:  60 y/o presenting with copde after smoking.  Hospital Course:   COPD exacerbation ([place]) - continue home maintenance regimen - prednisone for 2 more days to complete a total of 5 days of steroid treatment.  Leukocytosis - Most likely due to steroid administration  Hypokalemia -replaced orally while in house. Recommend reassessing next am.  Active Problems:   Hepatitis C - stable    TOBACCO ABUSE   HTN (hypertension)   Chronic pain syndrome - stable  Constipation - Add Senna and most likely due to opiod regimen     Lung nodule - will need outpatient follow up, please see hpi  Procedures:  None  Consultations:  None  Discharge Exam: Vitals:   07/21/16 2041 07/22/16 0549  BP: (!) 162/102 (!) 166/108  Pulse: 72 89  Resp: 19 18  Temp: 98.8 F (37.1 C) 98.7 F (37.1 C)    General: Pt in nad, alert and awake Cardiovascular: rrr, no mrg Respiratory: no increased wob, prolonged exp phase, equal chest rise.  Discharge Instructions   Discharge Instructions    Call MD for:  difficulty breathing, headache or visual disturbances    Complete by:  As directed    Call MD for:  extreme fatigue    Complete by:  As directed    Call MD for:  temperature >100.4    Complete by:  As  directed    Diet - low sodium heart healthy    Complete by:  As directed    Discharge instructions    Complete by:  As directed    Please be sure to follow up with your primary care physician in the next 1 week.   Increase activity slowly    Complete by:  As directed      Current Discharge Medication List    START taking these medications   Details  predniSONE (DELTASONE) 50 MG tablet One tab per mouth daily Qty: 2 tablet, Refills: 0      CONTINUE these medications which have CHANGED   Details  amLODipine (NORVASC) 2.5 MG tablet Take 1 tablet (2.5 mg total) by mouth daily. Qty: 30 tablet, Refills: 0    doxycycline (VIBRA-TABS) 100 MG tablet Take 1 tablet (100 mg total) by mouth every 12 (twelve) hours. Qty: 8 tablet, Refills: 0      CONTINUE these medications which have NOT CHANGED   Details  albuterol (PROVENTIL HFA;VENTOLIN HFA) 108 (90 Base) MCG/ACT inhaler Inhale 2 puffs into the lungs every 6 (six) hours as needed for wheezing or shortness of breath. Qty: 1 Inhaler, Refills: 2    albuterol (PROVENTIL) (2.5 MG/3ML) 0.083% nebulizer solution Take 3 mLs (2.5 mg total) by nebulization every 6 (six) hours as needed for wheezing or shortness of breath (buy this only if you cannot  afford the Duoneb). Qty: 75 mL, Refills: 12    DULoxetine (CYMBALTA) 60 MG capsule Take 60 mg by mouth 2 (two) times daily.    Fluticasone-Salmeterol (ADVAIR DISKUS) 250-50 MCG/DOSE AEPB Inhale 1 puff into the lungs 2 (two) times daily. Qty: 60 each, Refills: 0    gabapentin (NEURONTIN) 800 MG tablet Take 800 mg by mouth 3 (three) times daily.    methadone (DOLOPHINE) 10 MG/ML solution Take 140 mg by mouth daily.     Respiratory Therapy Supplies (NEBULIZER) DEVI Nebulizer for home use. Qty: 1 each, Refills: 0    tiotropium (SPIRIVA HANDIHALER) 18 MCG inhalation capsule Place 1 capsule (18 mcg total) into inhaler and inhale daily. Qty: 30 capsule, Refills: 0    tiZANidine (ZANAFLEX) 4 MG  tablet Take 4 mg by mouth 2 (two) times daily.    benzonatate (TESSALON) 100 MG capsule Take 1 capsule (100 mg total) by mouth 3 (three) times daily as needed for cough. Qty: 30 capsule, Refills: 0    dextromethorphan-guaiFENesin (MUCINEX DM) 30-600 MG 12hr tablet Take 1 tablet by mouth 2 (two) times daily. Qty: 60 tablet, Refills: 0    ipratropium-albuterol (DUONEB) 0.5-2.5 (3) MG/3ML SOLN Take 3 mLs by nebulization every 6 (six) hours as needed. Qty: 360 mL, Refills: 0    nicotine (NICODERM CQ) 7 mg/24hr patch Place 1 patch (7 mg total) onto the skin daily. Qty: 14 patch, Refills: 0      STOP taking these medications     amoxicillin-clavulanate (AUGMENTIN) 875-125 MG tablet      ondansetron (ZOFRAN) 4 MG tablet        Allergies  Allergen Reactions  . Aspirin     Upset stomach      The results of significant diagnostics from this hospitalization (including imaging, microbiology, ancillary and laboratory) are listed below for reference.    Significant Diagnostic Studies: Dg Chest 2 View  Result Date: 07/19/2016 CLINICAL DATA:  60 year old female with pneumonia 3 months ago. Persistent productive cough. Current smoker. Subsequent encounter. EXAM: CHEST  2 VIEW COMPARISON:  04/06/2016, 03/07/2016 and 03/16/2015 chest x-ray. FINDINGS: Marked emphysema with large bullae/ blebs right mid to upper lobe. Chronic lung changes. Additionally, when compared to several prior examination, wedge-shaped opacities best appreciated on lateral view are noted. Given patient's history of persistent cough and smoking, CT the chest is recommended for further delineation to determine if there may be an underlying mass. Calcified slightly tortuous aorta. Heart size within normal limits. IMPRESSION: Marked emphysema with large bullae/ blebs right mid to upper lobe. When compared to several prior examination, wedge-shaped opacities best appreciated on lateral view are noted. Given patient's history of  persistent cough and smoking, chest CT is recommended for further delineation to determine if there may be an underlying mass. Electronically Signed   By: Genia Del M.D.   On: 07/19/2016 13:29   Ct Chest Wo Contrast  Result Date: 07/19/2016 CLINICAL DATA:  Increasing shortness of breath.  COPD. EXAM: CT CHEST WITHOUT CONTRAST TECHNIQUE: Multidetector CT imaging of the chest was performed following the standard protocol without IV contrast. COMPARISON:  05/07/2014 FINDINGS: Cardiovascular: Normal heart size. Aortic atherosclerosis noted. Calcification within the LAD coronary artery noted. Mediastinum/Nodes: No axillary or supraclavicular adenopathy. No mediastinal or hilar adenopathy. The trachea appears patent and is midline. Normal appearance of the esophagus. Lungs/Pleura: Advanced bullous emphysema identified within both lungs. Diffuse bronchial wall thickening. Chronic progressive subsegmental atelectasis involving the anterior medial right middle lobe is identified, image number 114  of series 7. Triangular-shaped density within the posterior perifissural left upper lobe. This measures 2.7 x 0.8 cm, image 74 of series 7. Previous 2.4 x 1.1 cm. There is a new focal area of nodular consolidate within the anterior right upper lobe, image number 80 of series 7. This measures 3.2 x 1.3 cm (mean diameter 2.2 cm), image 80 of series 7. Stable scar like density within the lingular portion of the left lung, image 121 of series 7. Within the posterior medial left lower lobe there is a focal area of bronchiectasis and tree-in-bud nodularity, image 104 of series 7, likely the sequelae of previous inflammation/aspiration. Upper Abdomen: No acute abnormality. Musculoskeletal: No chest wall mass or suspicious bone lesions identified. IMPRESSION: 1. Marked advanced smoking related changes including bullous emphysema and diffuse bronchial wall thickening. 2. Progressive subsegmental atelectasis within the right middle  lobe compared with 05/07/2014. 3. New nodular density within the anterior right upper lobe has a mean diameter of 2.2 cm. Recommend followup imaging in 3 months to ensure resolution. 4. Stable perifissural scarring within the posterior right upper lobe. 5. Focal area of bronchiectasis and tree-in-bud nodularity within the posterior medial left lower lobe is likely the sequelae of inflammation and/or aspiration. 6. Aortic atherosclerosis and coronary artery calcifications. Electronically Signed   By: Kerby Moors M.D.   On: 07/19/2016 14:11    Microbiology: Recent Results (from the past 240 hour(s))  MRSA PCR Screening     Status: Abnormal   Collection Time: 07/20/16  7:01 AM  Result Value Ref Range Status   MRSA by PCR POSITIVE (A) NEGATIVE Final    Comment:        The GeneXpert MRSA Assay (FDA approved for NASAL specimens only), is one component of a comprehensive MRSA colonization surveillance program. It is not intended to diagnose MRSA infection nor to guide or monitor treatment for MRSA infections. RESULT CALLED TO, READ BACK BY AND VERIFIED WITH: PINNIX,A RN AT 0957 ON 10.13.17 BY EPPERSON,S      Labs: Basic Metabolic Panel:  Recent Labs Lab 07/19/16 1418 07/19/16 1520 07/20/16 0606  NA 139 141 141  K 3.1* 3.3* 3.1*  CL 106 104 108  CO2 25  --  25  GLUCOSE 86 85 99  BUN '6 7 9  '$ CREATININE 1.05* 1.00 1.00  CALCIUM 8.7*  --  9.2   Liver Function Tests: No results for input(s): AST, ALT, ALKPHOS, BILITOT, PROT, ALBUMIN in the last 168 hours. No results for input(s): LIPASE, AMYLASE in the last 168 hours. No results for input(s): AMMONIA in the last 168 hours. CBC:  Recent Labs Lab 07/19/16 1418 07/19/16 1520 07/20/16 0606  WBC 14.5*  --  19.3*  NEUTROABS 10.6*  --   --   HGB 13.5 14.6 12.6  HCT 39.9 43.0 39.0  MCV 90.5  --  92.4  PLT 265  --  265   Cardiac Enzymes: No results for input(s): CKTOTAL, CKMB, CKMBINDEX, TROPONINI in the last 168  hours. BNP: BNP (last 3 results)  Recent Labs  04/06/16 0208  BNP 62.6    ProBNP (last 3 results) No results for input(s): PROBNP in the last 8760 hours.  CBG: No results for input(s): GLUCAP in the last 168 hours.  Signed:  Velvet Bathe MD.  [facility] 07/22/2016, 1:12 PM

## 2016-07-22 NOTE — Progress Notes (Signed)
Pt going home this afternoon. Alert, oriented, and without c/o. Discharge instructions/prescriptions given/explained with pt verbalizing understanding.  Pt leaving with her own O2. Followup appointments noted.

## 2016-07-23 NOTE — Progress Notes (Signed)
Pt discharged on 10/15. Pt came in asking for records of when she last received her methadone. RN provided the pt with documentation. Chart accessed for this reason.  Karnell Vanderloop W Maxwel Meadowcroft, RN

## 2016-10-03 IMAGING — DX DG CHEST 1V PORT
1 series · 1 of 1 positions shown · non-contrast
Comparison: March 09, 2016

CLINICAL DATA: Dizziness.  Loss of consciousness.

EXAM:
PORTABLE CHEST 1 VIEW

[chest ap]
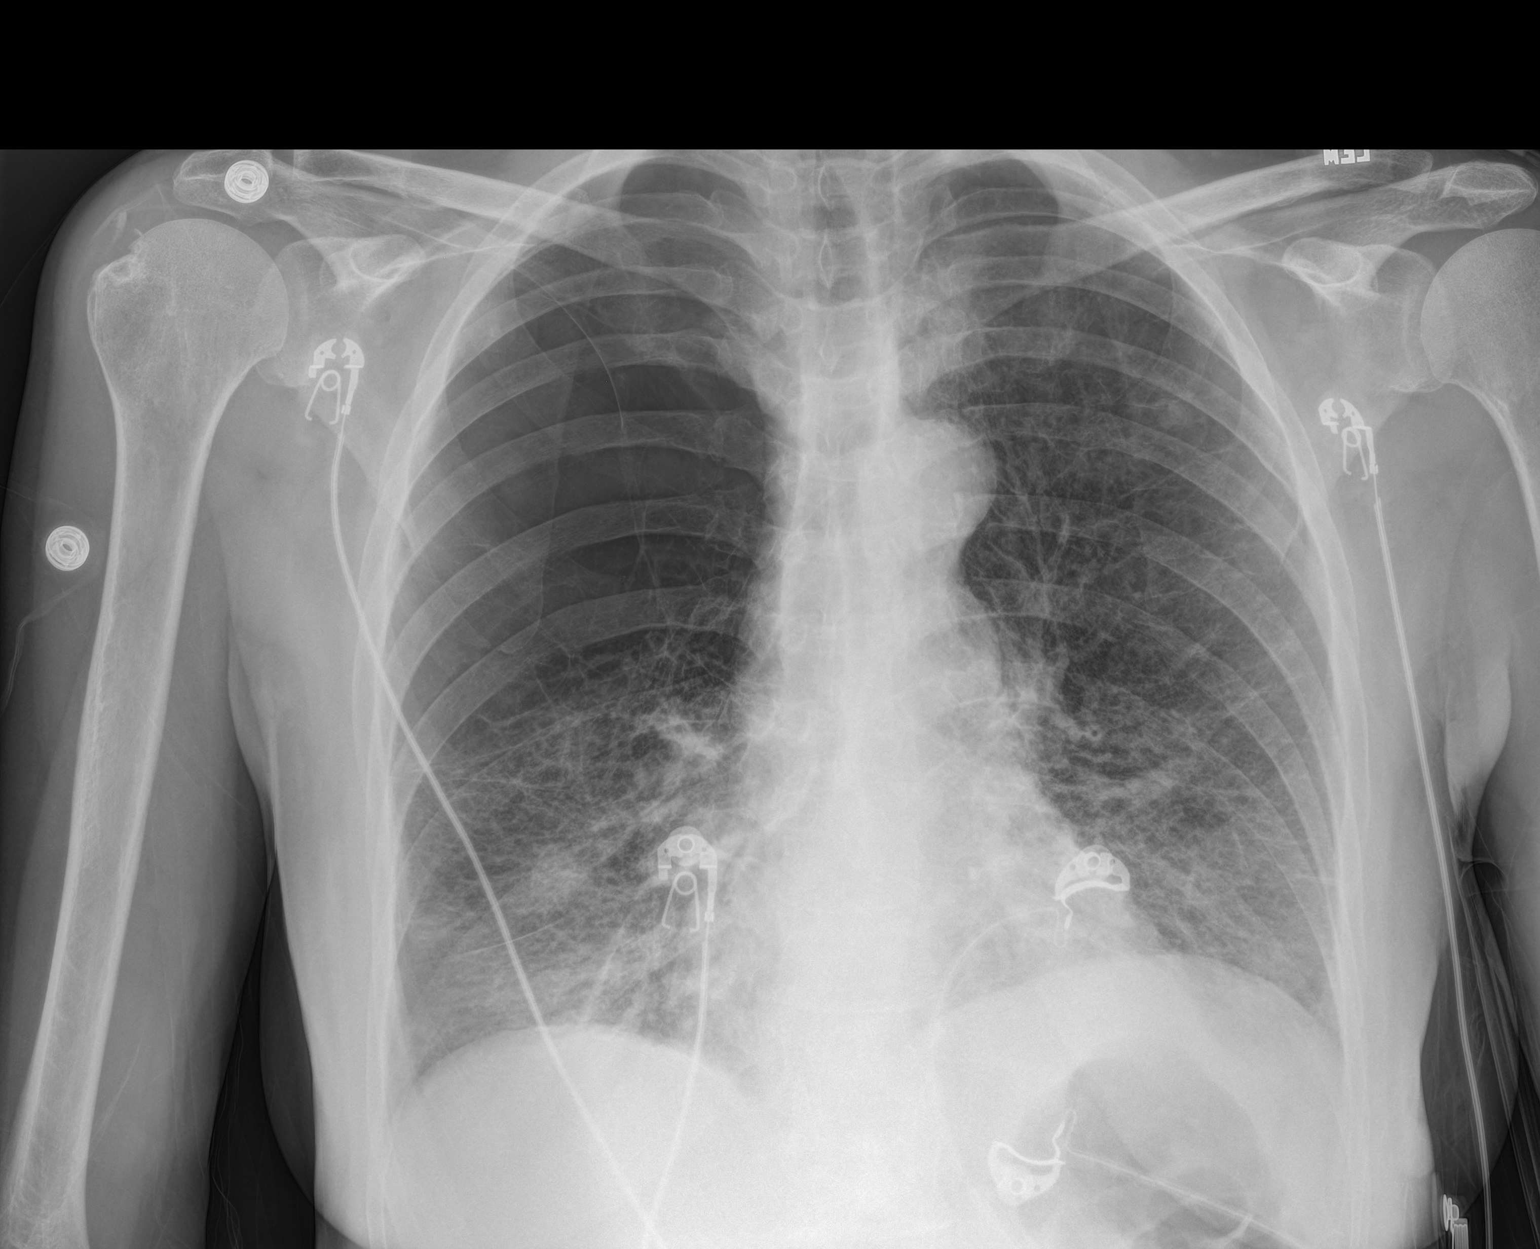

[1 of 1 positions shown; findings below may reference images not displayed]

FINDINGS: Emphysematous changes with a large bulla in the right upper lung are
stable. There is a nodular density in the left upper lung measuring
9 mm. Increased interstitial markings in the bases are largely a due
to the patient's known fibrosis reported on the previous study. No
acute findings seen on the left. The opacity in the right base is a
little more prominent in the interval and an acute on chronic
process is not excluded. The cardiomediastinal silhouette is stable.
IMPRESSION: 1. Emphysematous changes.
2. A nodular density projects over the left upper lobe, not seen on
other recent priors. It is possible this is artifactual. Recommend
short-term follow-up to ensure resolution.
3. Known fibrotic changes in the lung bases. The opacity is a little
more prominent and focal in the right base suggesting an acute on
chronic process.

## 2016-10-04 IMAGING — DX DG CHEST 1V PORT
2 series · 2 of 2 positions shown · non-contrast
Comparison: Portable chest x-ray April 05, 2016

CLINICAL DATA: Suspected pneumonia, history of COPD, current
smoker, hepatitis-C.

EXAM:
PORTABLE CHEST 1 VIEW

[chest ap (1 of 2)]
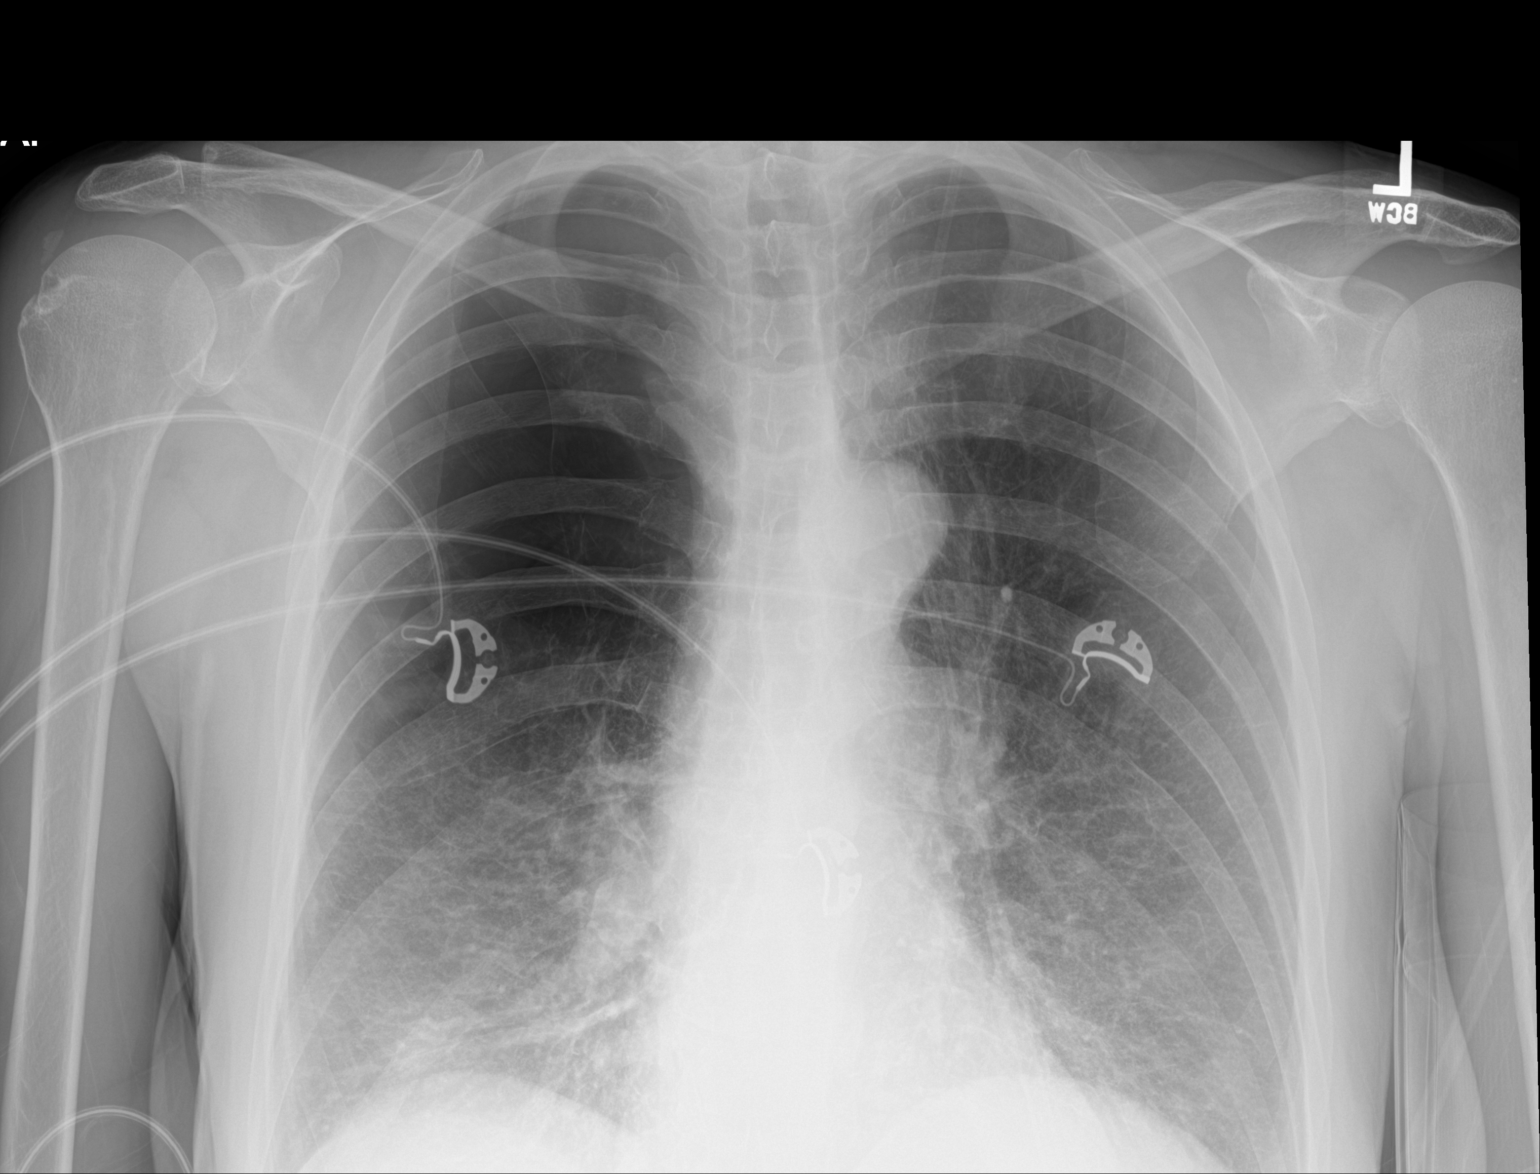

[chest ap (2 of 2)]
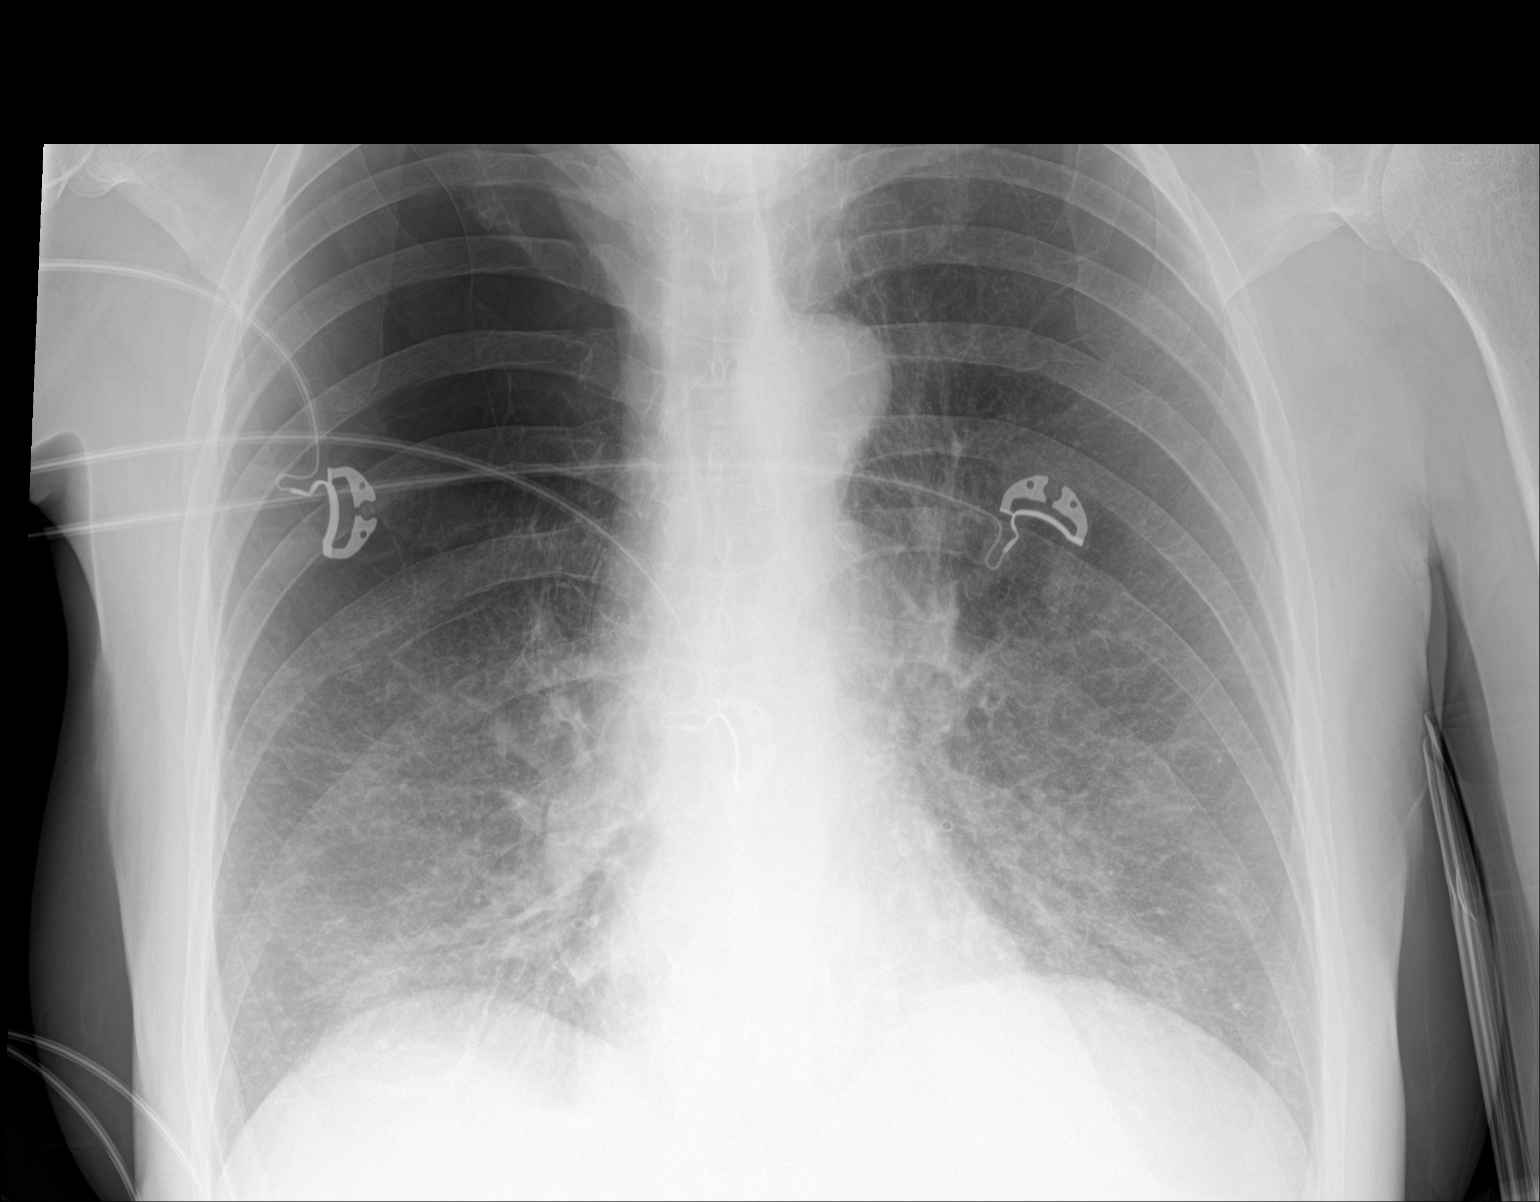

[2 of 2 positions shown; findings below may reference images not displayed]

FINDINGS: Severe bullous change in the right upper lobe is again demonstrated.
The interstitial markings in the right mid and lower lung remain
increased. The interstitial markings on the left are diffusely
increased. There is no alveolar infiltrate. There is no pleural
effusion. The heart and pulmonary vascularity are normal. The
observed bony thorax is unremarkable.
IMPRESSION: Bullous emphysematous change greatest in the left upper lobe.
Persistently increased interstitial markings diffusely consistent
with pulmonary fibrotic change. One cannot exclude superimposed
interstitial pneumonia. No alveolar pneumonia nor CHF.

## 2016-11-12 ENCOUNTER — Emergency Department (HOSPITAL_COMMUNITY)
Admission: EM | Admit: 2016-11-12 | Discharge: 2016-11-12 | Disposition: A | Discharge status: Home / Self Care | Payer: Medicare Other | Attending: Emergency Medicine | Admitting: Emergency Medicine

## 2016-11-12 ENCOUNTER — Emergency Department (HOSPITAL_COMMUNITY): Payer: Medicare Other

## 2016-11-12 ENCOUNTER — Encounter (HOSPITAL_COMMUNITY): Payer: Self-pay

## 2016-11-12 DIAGNOSIS — J189 Pneumonia, unspecified organism: Secondary | ICD-10-CM

## 2016-11-12 DIAGNOSIS — Z79899 Other long term (current) drug therapy: Secondary | ICD-10-CM | POA: Insufficient documentation

## 2016-11-12 DIAGNOSIS — J181 Lobar pneumonia, unspecified organism: Secondary | ICD-10-CM | POA: Diagnosis not present

## 2016-11-12 DIAGNOSIS — J449 Chronic obstructive pulmonary disease, unspecified: Secondary | ICD-10-CM | POA: Insufficient documentation

## 2016-11-12 DIAGNOSIS — Z87891 Personal history of nicotine dependence: Secondary | ICD-10-CM | POA: Insufficient documentation

## 2016-11-12 DIAGNOSIS — R0602 Shortness of breath: Secondary | ICD-10-CM | POA: Diagnosis present

## 2016-11-12 DIAGNOSIS — R042 Hemoptysis: Secondary | ICD-10-CM

## 2016-11-12 DIAGNOSIS — I10 Essential (primary) hypertension: Secondary | ICD-10-CM | POA: Diagnosis not present

## 2016-11-12 LAB — CBC WITH DIFFERENTIAL/PLATELET
Basophils Absolute: 0 10*3/uL (ref 0.0–0.1)
Basophils Relative: 0 %
Eosinophils Absolute: 0.3 10*3/uL (ref 0.0–0.7)
Eosinophils Relative: 3 %
HCT: 38.2 % (ref 36.0–46.0)
Hemoglobin: 12.2 g/dL (ref 12.0–15.0)
Lymphocytes Relative: 27 %
Lymphs Abs: 2.2 10*3/uL (ref 0.7–4.0)
MCH: 29.5 pg (ref 26.0–34.0)
MCHC: 31.9 g/dL (ref 30.0–36.0)
MCV: 92.5 fL (ref 78.0–100.0)
Monocytes Absolute: 0.6 10*3/uL (ref 0.1–1.0)
Monocytes Relative: 8 %
Neutro Abs: 5.1 10*3/uL (ref 1.7–7.7)
Neutrophils Relative %: 62 %
Platelets: 255 10*3/uL (ref 150–400)
RBC: 4.13 MIL/uL (ref 3.87–5.11)
RDW: 13.3 % (ref 11.5–15.5)
WBC: 8.2 10*3/uL (ref 4.0–10.5)

## 2016-11-12 LAB — BASIC METABOLIC PANEL
Anion gap: 6 (ref 5–15)
BUN: 6 mg/dL (ref 6–20)
CO2: 27 mmol/L (ref 22–32)
Calcium: 9 mg/dL (ref 8.9–10.3)
Chloride: 105 mmol/L (ref 101–111)
Creatinine, Ser: 0.9 mg/dL (ref 0.44–1.00)
GFR calc Af Amer: >60 mL/min (ref >60)
GFR calc non Af Amer: >60 mL/min (ref >60)
Glucose, Bld: 71 mg/dL (ref 65–99)
Potassium: 3.9 mmol/L (ref 3.5–5.1)
Sodium: 138 mmol/L (ref 135–145)

## 2016-11-12 MED ORDER — SODIUM CHLORIDE 0.9 % IJ SOLN
INTRAMUSCULAR | Status: AC
  Filled 2016-11-12: qty 50

## 2016-11-12 MED ORDER — IOPAMIDOL (ISOVUE-370) INJECTION 76%
INTRAVENOUS | Status: AC
  Filled 2016-11-12: qty 100

## 2016-11-12 MED ORDER — HYDROMORPHONE HCL 1 MG/ML IJ SOLN
1.0000 mg | Freq: Once | INTRAMUSCULAR | Status: AC
  Administered 2016-11-12: 1 mg via INTRAVENOUS
  Filled 2016-11-12: qty 1

## 2016-11-12 MED ORDER — IOPAMIDOL (ISOVUE-370) INJECTION 76%
100.0000 mL | Freq: Once | INTRAVENOUS | Status: AC | PRN
  Administered 2016-11-12: 77 mL via INTRAVENOUS

## 2016-11-12 MED ORDER — AMOXICILLIN 500 MG PO CAPS: 500.0000 mg | ORAL_CAPSULE | Freq: Three times a day (TID) | ORAL | 0 refills | Status: DC

## 2016-11-12 MED ORDER — AZITHROMYCIN 250 MG PO TABS: 250.0000 mg | ORAL_TABLET | Freq: Every day | ORAL | 0 refills | Status: DC

## 2016-11-12 MED ORDER — SODIUM CHLORIDE 0.9 % IV SOLN
INTRAVENOUS | Status: DC
  Administered 2016-11-12: 100 mL/h via INTRAVENOUS

## 2016-11-12 MED ORDER — AZITHROMYCIN 250 MG PO TABS
500.0000 mg | ORAL_TABLET | Freq: Once | ORAL | Status: AC
  Administered 2016-11-12: 500 mg via ORAL
  Filled 2016-11-12: qty 2

## 2016-11-12 MED ORDER — DEXTROSE 5 % IV SOLN
1.0000 g | Freq: Once | INTRAVENOUS | Status: AC
  Administered 2016-11-12: 1 g via INTRAVENOUS
  Filled 2016-11-12: qty 10

## 2016-11-12 MED ORDER — DEXTROSE 5 % IV SOLN: 500.0000 mg | Freq: Once | INTRAVENOUS | Status: DC

## 2016-11-12 NOTE — ED Notes (Addendum)
Discharge instructions, follow up care, and rx x2 reviewed with patient. Patient verbalized understanding. Patient states she has oxygen in car to get home.

## 2016-11-12 NOTE — ED Provider Notes (Signed)
[facility] DEPT Provider Note   CSN: [id number] Arrival date & time: 11/12/16  4098  By signing my name below, I, Evelene Croon, attest that this documentation has been prepared under the direction and in the presence of Virgel Manifold, MD . Electronically Signed: Evelene Croon, Scribe. 11/12/2016. 9:34 AM.  History   Chief Complaint Chief Complaint  Patient presents with  . Hemoptysis  . Shortness of Breath  . Chest Pain    right side    The history is provided by the patient. No language interpreter was used.    HPI Comments:  Emily Moran is a 61 y.o. female who presents to the Emergency Department complaining of hemoptysis x 4-5 days with associated increased SOB. She also notes night sweats and subjective fever x 2 days, right sided CP, decreased appetite, and pain in the LLE. She denies swelling to her BLE as well as a h/o PE/DVT. No alleviating factors noted. Pt has a h/o COPD and uses home O2 PRN. Pt is an occasional smoker.    Past Medical History:  Diagnosis Date  . Anemia    "as a child"  . Anxiety   . Arthritis    "shoulders; back" (03/16/2015)  . Chronic back pain   . Chronic bronchitis ([place])    "I basically keep it" (03/16/2015)  . Chronic pain   . COPD (chronic obstructive pulmonary disease) ([place])   . DDD (degenerative disc disease), lumbar   . Degenerative disc disease   . Depression   . Family history of adverse reaction to anesthesia    "my mother had an allergic reaction when she had kidney removed back in the '60's or '70's""  . Headache    "monthly; need glasses; comes on when I'm stressed or get too tired" (03/16/2015)  . Hepatitis C   . History of stomach ulcers   . Hypertension   . Kidney stone   . On home O2    "~ 3L once/wk and prn" (03/16/2015)  . Pain management   . Pneumonia    "@ least once/yr for the past 8 yrs" (03/16/2015)  . Spinal stenosis   . Tobacco use     Patient Active Problem List   Diagnosis Date Noted  . COPD exacerbation  ([place]) 07/19/2016  . Lung nodule 07/19/2016  . Septic shock ([place]) 04/05/2016  . HCAP (healthcare-associated pneumonia) 04/05/2016  . CAP (community acquired pneumonia) 03/07/2016  . Acute respiratory failure with hypoxia ([place]) 03/16/2015  . COPD (chronic obstructive pulmonary disease) ([place]) 03/16/2015  . HTN (hypertension) 03/16/2015  . Leukocytosis 03/16/2015  . Hyperglycemia 03/16/2015  . Chronic pain syndrome 03/16/2015  . Hepatitis C 12/18/2007  . TOBACCO ABUSE 09/11/2007  . Substance abuse 09/11/2007    Past Surgical History:  Procedure Laterality Date  . ANTERIOR CERVICAL DECOMP/DISCECTOMY FUSION  2002  . BACK SURGERY    . FEMUR IM NAIL Right 11/28/2012   Procedure: INTRAMEDULLARY (IM) RETROGRADE FEMORAL NAILING wants jackson table , c-arm and biomet ;  Surgeon: Mauri Pole, MD;  Location: [facility] ORS;  Service: Orthopedics;  Laterality: Right;  . FRACTURE SURGERY    . INCONTINENCE SURGERY  1998  . TOTAL ABDOMINAL HYSTERECTOMY  1998    OB History    No data available       Home Medications    Prior to Admission medications   Medication Sig Start Date End Date Taking? Authorizing Provider  albuterol (PROVENTIL HFA;VENTOLIN HFA) 108 (90 Base) MCG/ACT inhaler Inhale 2 puffs into  the lungs every 6 (six) hours as needed for wheezing or shortness of breath. 04/08/16   Debbe Odea, MD  albuterol (PROVENTIL) (2.5 MG/3ML) 0.083% nebulizer solution Take 3 mLs (2.5 mg total) by nebulization every 6 (six) hours as needed for wheezing or shortness of breath (buy this only if you cannot afford the Duoneb). 04/08/16   Debbe Odea, MD  amLODipine (NORVASC) 2.5 MG tablet Take 1 tablet (2.5 mg total) by mouth daily. 07/23/16   Velvet Bathe, MD  benzonatate (TESSALON) 100 MG capsule Take 1 capsule (100 mg total) by mouth 3 (three) times daily as needed for cough. Patient not taking: Reported on 07/19/2016 04/08/16   Debbe Odea, MD  dextromethorphan-guaiFENesin [employer] [facility] DM) 30-600 MG 12hr  tablet Take 1 tablet by mouth 2 (two) times daily. Patient not taking: Reported on 07/19/2016 04/08/16   Debbe Odea, MD  doxycycline (VIBRA-TABS) 100 MG tablet Take 1 tablet (100 mg total) by mouth every 12 (twelve) hours. 07/22/16   Velvet Bathe, MD  DULoxetine (CYMBALTA) 60 MG capsule Take 60 mg by mouth 2 (two) times daily.    Historical Provider, MD  Fluticasone-Salmeterol (ADVAIR DISKUS) 250-50 MCG/DOSE AEPB Inhale 1 puff into the lungs 2 (two) times daily. 04/08/16   Debbe Odea, MD  gabapentin (NEURONTIN) 800 MG tablet Take 800 mg by mouth 3 (three) times daily.    Historical Provider, MD  ipratropium-albuterol (DUONEB) 0.5-2.5 (3) MG/3ML SOLN Take 3 mLs by nebulization every 6 (six) hours as needed. Patient not taking: Reported on 07/19/2016 04/08/16   Debbe Odea, MD  methadone (DOLOPHINE) 10 MG/ML solution Take 140 mg by mouth daily.     Historical Provider, MD  nicotine (NICODERM CQ) 7 mg/24hr patch Place 1 patch (7 mg total) onto the skin daily. Patient not taking: Reported on 07/19/2016 04/08/16   Debbe Odea, MD  predniSONE (DELTASONE) 50 MG tablet One tab per mouth daily 07/22/16   Velvet Bathe, MD  Respiratory Therapy Supplies (NEBULIZER) [place] for home use. 08/17/15   Larene Pickett, PA-C  tiotropium (SPIRIVA HANDIHALER) 18 MCG inhalation capsule Place 1 capsule (18 mcg total) into inhaler and inhale daily. 04/08/16   Debbe Odea, MD  tiZANidine (ZANAFLEX) 4 MG tablet Take 4 mg by mouth 2 (two) times daily. 07/18/16   Historical Provider, MD    Family History Family History  Problem Relation Age of Onset  . Cancer Mother   . Emphysema Mother   . Heart failure Father   . Stroke Father     Social History Social History  Substance Use Topics  . Smoking status: Former Smoker    Packs/day: 0.25    Years: 36.00    Types: Cigarettes  . Smokeless tobacco: Never Used  . Alcohol use No     Comment: denies h/o alcohol abuse on 03/16/2015     Allergies    Aspirin   Review of Systems Review of Systems  Constitutional: Positive for appetite change, diaphoresis and fever.  Respiratory: Positive for cough and shortness of breath.        +Hemoptysis  Cardiovascular: Positive for chest pain. Negative for leg swelling.  Musculoskeletal: Positive for myalgias.  All other systems reviewed and are negative.    Physical Exam Updated Vital Signs BP 137/97 (BP Location: Right Arm)   Pulse 70   Temp 97.9 F (36.6 C) (Oral)   Ht '5\' 6"'$  (1.676 m)   Wt 130 lb (59 kg)   SpO2 (!) 87% Comment: Patient states she wears home  O2 3L/min via [place] at home. O2 3L/min via [place] placed on patient  BMI 20.98 kg/m   Physical Exam  Constitutional: She is oriented to person, place, and time. She appears well-developed and well-nourished. No distress.  HENT:  Head: Normocephalic and atraumatic.  Eyes: EOM are normal.  Neck: Normal range of motion.  Cardiovascular: Normal rate, regular rhythm and normal heart sounds.   Pulmonary/Chest: Effort normal. She has rhonchi.  Rhonchi bilateral lower lobes  Abdominal: Soft. She exhibits no distension. There is no tenderness.  Musculoskeletal: Normal range of motion. She exhibits no edema.  No edema or calf tenderness of the BLE   Neurological: She is alert and oriented to person, place, and time.  Skin: Skin is warm and dry.  Psychiatric: She has a normal mood and affect. Judgment normal.  Nursing note and vitals reviewed.    ED Treatments / Results  DIAGNOSTIC STUDIES:  Oxygen Saturation is 96% on [place], normal by my interpretation.    COORDINATION OF CARE:  9:31 AM Discussed treatment plan with pt at bedside and pt agreed to plan.  Labs (all labs ordered are listed, but only abnormal results are displayed) Labs Reviewed  CBC WITH DIFFERENTIAL/PLATELET  BASIC METABOLIC PANEL    EKG  EKG Interpretation  Date/Time:  Monday November 12 2016 09:05:20 EST Ventricular Rate:  61 PR Interval:    QRS  Duration: 96 QT Interval:  474 QTC Calculation: 478 R Axis:   82 Text Interpretation:  Sinus rhythm RSR' in V1 or V2, probably normal variant Baseline wander in lead(s) V2 Confirmed by Wilson Singer  MD, Jakiya Bookbinder ([id number]) on 11/12/2016 9:57:45 AM       Radiology No results found.   Ct Angio Chest Pe W Or Wo Contrast  Result Date: 11/12/2016 CLINICAL DATA:  Hemoptysis, shortness of breath for 2 days ago. EXAM: CT ANGIOGRAPHY CHEST WITH CONTRAST TECHNIQUE: Multidetector CT imaging of the chest was performed using the standard protocol during bolus administration of intravenous contrast. Multiplanar CT image reconstructions and MIPs were obtained to evaluate the vascular anatomy. CONTRAST:  77 cc Isovue 370 IV COMPARISON:  CT 07/19/2016 FINDINGS: Cardiovascular: Heart is normal size. Scattered coronary artery calcifications and aortic calcifications. No evidence of aortic aneurysm. Mediastinum/Nodes: No mediastinal, hilar, or axillary adenopathy. Lungs/Pleura: Severe bullous emphysematous changes, with large bulla in the right upper lobe. Platelike nodular area noted in the right upper lobe measures up to 2.9 cm compared to a 2.7 cm previously, not significantly changed. Previously seen area of nodular opacity anteriorly in the right upper lobe has resolved with residual scarring. Residual bronchial wall thickening and atelectasis or scarring in the right middle lobe. More peripherally in the right middle lobe and in both lower lobes dependently, there is increasing airspace disease. Upper Abdomen: Imaging into the upper abdomen shows no acute findings. Musculoskeletal: Chest wall soft tissues are unremarkable. No acute bony abnormality or focal bone lesion. Review of the MIP images confirms the above findings. IMPRESSION: Severe bullous emphysematous changes in the lungs. Nodular platelike density noted in the right upper lobe measuring up to 2.9 cm compared to 2.7 cm previously, not felt to be significantly changed.  Slight difference is likely related to differences in scan planes. New airspace disease peripherally in the right middle lobe. Cannot exclude pneumonia. Dependent bibasilar opacities in the lower lobes, likely atelectasis. Scattered coronary artery calcifications.  Aortic atherosclerosis. Electronically Signed   By: Rolm Baptise M.D.   On: 11/12/2016 11:59   Procedures Procedures (including  critical care time)  Medications Ordered in ED Medications - No data to display   Initial Impression / Assessment and Plan / ED Course  I have reviewed the triage vital signs and the nursing notes.  Pertinent labs & imaging results that were available during my care of the patient were reviewed by me and considered in my medical decision making (see chart for details).     60yF with dyspnea/hemoptysis. Likely 2/2 pneumonia. Imaging as above. Location of her pain corresponds to radiographic findings. I feel appropriate for outpt tx.   Final Clinical Impressions(s) / ED Diagnoses   Final diagnoses:  Hemoptysis  Community acquired pneumonia of right lung, unspecified part of lung    New Prescriptions New Prescriptions   No medications on file   I personally preformed the services scribed in my presence. The recorded information has been reviewed is accurate. Virgel Manifold, MD.     Virgel Manifold, MD 11/18/16 1501

## 2016-11-12 NOTE — ED Triage Notes (Signed)
Patient c/o increased SOB and hemoptysis x 2 days. Patient also c/o intermittent right chest pain x 2 days. Patient states better today.

## 2017-01-25 ENCOUNTER — Encounter (HOSPITAL_COMMUNITY): Payer: Self-pay

## 2017-01-25 ENCOUNTER — Emergency Department (HOSPITAL_COMMUNITY): Payer: Medicare Other

## 2017-01-25 ENCOUNTER — Emergency Department (HOSPITAL_COMMUNITY)
Admission: EM | Admit: 2017-01-25 | Discharge: 2017-01-25 | Disposition: A | Discharge status: Home / Self Care | Payer: Medicare Other | Attending: Emergency Medicine | Admitting: Emergency Medicine

## 2017-01-25 DIAGNOSIS — Z79899 Other long term (current) drug therapy: Secondary | ICD-10-CM | POA: Diagnosis not present

## 2017-01-25 DIAGNOSIS — I10 Essential (primary) hypertension: Secondary | ICD-10-CM | POA: Insufficient documentation

## 2017-01-25 DIAGNOSIS — J441 Chronic obstructive pulmonary disease with (acute) exacerbation: Secondary | ICD-10-CM | POA: Diagnosis not present

## 2017-01-25 DIAGNOSIS — R0602 Shortness of breath: Secondary | ICD-10-CM | POA: Diagnosis present

## 2017-01-25 DIAGNOSIS — Z87891 Personal history of nicotine dependence: Secondary | ICD-10-CM | POA: Insufficient documentation

## 2017-01-25 LAB — I-STAT CHEM 8, ED
BUN: 5 mg/dL — ABNORMAL LOW (ref 6–20)
CALCIUM ION: 1.11 mmol/L — AB (ref 1.15–1.40)
Chloride: 101 mmol/L (ref 101–111)
Creatinine, Ser: 0.9 mg/dL (ref 0.44–1.00)
Glucose, Bld: 192 mg/dL — ABNORMAL HIGH (ref 65–99)
HEMATOCRIT: 42 % (ref 36.0–46.0)
Hemoglobin: 14.3 g/dL (ref 12.0–15.0)
POTASSIUM: 3.1 mmol/L — AB (ref 3.5–5.1)
SODIUM: 140 mmol/L (ref 135–145)
TCO2: 27 mmol/L (ref 0–100)

## 2017-01-25 MED ORDER — ALBUTEROL SULFATE (2.5 MG/3ML) 0.083% IN NEBU
5.0000 mg | INHALATION_SOLUTION | Freq: Once | RESPIRATORY_TRACT | Status: AC
  Administered 2017-01-25: 5 mg via RESPIRATORY_TRACT
  Filled 2017-01-25: qty 6

## 2017-01-25 MED ORDER — ALBUTEROL SULFATE HFA 108 (90 BASE) MCG/ACT IN AERS: 2.0000 | INHALATION_SPRAY | Freq: Four times a day (QID) | RESPIRATORY_TRACT | 2 refills | Status: DC | PRN

## 2017-01-25 MED ORDER — ALBUTEROL SULFATE (2.5 MG/3ML) 0.083% IN NEBU: 2.5000 mg | INHALATION_SOLUTION | Freq: Four times a day (QID) | RESPIRATORY_TRACT | 12 refills | Status: DC | PRN

## 2017-01-25 MED ORDER — PREDNISONE 10 MG PO TABS: 60.0000 mg | ORAL_TABLET | Freq: Every day | ORAL | 0 refills | Status: DC

## 2017-01-25 MED ORDER — PREDNISONE 20 MG PO TABS
60.0000 mg | ORAL_TABLET | Freq: Once | ORAL | Status: AC
  Administered 2017-01-25: 60 mg via ORAL
  Filled 2017-01-25: qty 3

## 2017-01-25 MED ORDER — IPRATROPIUM BROMIDE 0.02 % IN SOLN
0.5000 mg | Freq: Once | RESPIRATORY_TRACT | Status: AC
  Administered 2017-01-25: 0.5 mg via RESPIRATORY_TRACT
  Filled 2017-01-25: qty 2.5

## 2017-01-25 MED ORDER — FLUTICASONE-SALMETEROL 250-50 MCG/DOSE IN AEPB: 1.0000 | INHALATION_SPRAY | Freq: Two times a day (BID) | RESPIRATORY_TRACT | 0 refills | Status: DC

## 2017-01-25 NOTE — ED Triage Notes (Signed)
Pt C/o SOB exacerbation which has been worse for about a week. Spitting up thick phlegm. Pt is feeling weak, she says she thinks she has pneumonia.

## 2017-01-25 NOTE — ED Notes (Signed)
EKG PERFORMED IN TRIAGE

## 2017-01-25 NOTE — ED Notes (Signed)
RRT AT [place]

## 2017-01-25 NOTE — ED Notes (Signed)
Pt 96% on 2l

## 2017-01-25 NOTE — ED Notes (Signed)
PT IS ON 3LNC HOLDING 90-93%. PT IS WITHOUT COMPLAINT.

## 2017-01-25 NOTE — ED Notes (Signed)
Respiratory paged for neb treatment.

## 2017-01-25 NOTE — ED Notes (Signed)
Bed: WA25 Expected date:  Expected time:  Means of arrival:  Comments: 

## 2017-01-28 NOTE — ED Provider Notes (Signed)
[facility] DEPT Provider Note   CSN: [id number] Arrival date & time: 01/25/17  6063     History   Chief Complaint Chief Complaint  Patient presents with  . Shortness of Breath    HPI Emily Moran is a 61 y.o. female.  HPI Patient is a 61 year old female with a history of COPD for which she is on home oxygen.  She feels like her breathing is worsened over the past week and she's having productive cough.  She feels generally weak.  She denies fevers or chills.  Denies orthopnea.  No unilateral leg swelling.  She's been trying her bronchodilators at home without improvement in her symptoms.  Symptoms are moderate in severity.   Past Medical History:  Diagnosis Date  . Anemia    "as a child"  . Anxiety   . Arthritis    "shoulders; back" (03/16/2015)  . Chronic back pain   . Chronic bronchitis ([place])    "I basically keep it" (03/16/2015)  . Chronic pain   . COPD (chronic obstructive pulmonary disease) ([place])   . DDD (degenerative disc disease), lumbar   . Degenerative disc disease   . Depression   . Family history of adverse reaction to anesthesia    "my mother had an allergic reaction when she had kidney removed back in the '60's or '70's""  . Headache    "monthly; need glasses; comes on when I'm stressed or get too tired" (03/16/2015)  . Hepatitis C   . History of stomach ulcers   . Hypertension   . Kidney stone   . On home O2    "~ 3L once/wk and prn" (03/16/2015)  . Pain management   . Pneumonia    "@ least once/yr for the past 8 yrs" (03/16/2015)  . Spinal stenosis   . Tobacco use     Patient Active Problem List   Diagnosis Date Noted  . COPD exacerbation ([place]) 07/19/2016  . Lung nodule 07/19/2016  . Septic shock ([place]) 04/05/2016  . HCAP (healthcare-associated pneumonia) 04/05/2016  . CAP (community acquired pneumonia) 03/07/2016  . Acute respiratory failure with hypoxia ([place]) 03/16/2015  . COPD (chronic obstructive pulmonary disease) ([place]) 03/16/2015  . HTN  (hypertension) 03/16/2015  . Leukocytosis 03/16/2015  . Hyperglycemia 03/16/2015  . Chronic pain syndrome 03/16/2015  . Hepatitis C 12/18/2007  . TOBACCO ABUSE 09/11/2007  . Substance abuse 09/11/2007    Past Surgical History:  Procedure Laterality Date  . ANTERIOR CERVICAL DECOMP/DISCECTOMY FUSION  2002  . BACK SURGERY    . FEMUR IM NAIL Right 11/28/2012   Procedure: INTRAMEDULLARY (IM) RETROGRADE FEMORAL NAILING wants jackson table , c-arm and biomet ;  Surgeon: Mauri Pole, MD;  Location: [facility]S;  Service: Orthopedics;  Laterality: Right;  . FRACTURE SURGERY    . INCONTINENCE SURGERY  1998  . TOTAL ABDOMINAL HYSTERECTOMY  1998    OB History    No data available       Home Medications    Prior to Admission medications   Medication Sig Start Date End Date Taking? Authorizing Provider  albuterol (PROVENTIL HFA;VENTOLIN HFA) 108 (90 Base) MCG/ACT inhaler Inhale 2 puffs into the lungs every 6 (six) hours as needed for wheezing or shortness of breath. 01/25/17   Jola Schmidt, MD  albuterol (PROVENTIL) (2.5 MG/3ML) 0.083% nebulizer solution Take 3 mLs (2.5 mg total) by nebulization every 6 (six) hours as needed for wheezing or shortness of breath (buy this only if you cannot afford the Duoneb).  01/25/17   Jola Schmidt, MD  amLODipine (NORVASC) 2.5 MG tablet Take 1 tablet (2.5 mg total) by mouth daily. 07/23/16   Velvet Bathe, MD  azithromycin (ZITHROMAX Z-PAK) 250 MG tablet Take 1 tablet (250 mg total) by mouth daily. 2 pills ('500mg'$ ) day 1 then 1 pill ('250mg'$ ) days 2-5. 11/12/16   Virgel Manifold, MD  dextromethorphan-guaiFENesin [facility] DM) 30-600 MG 12hr tablet Take 1 tablet by mouth 2 (two) times daily. Patient not taking: Reported on 07/19/2016 04/08/16   Debbe Odea, MD  DULoxetine (CYMBALTA) 60 MG capsule Take 60 mg by mouth 2 (two) times daily.    Historical Provider, MD  Fluticasone-Salmeterol (ADVAIR DISKUS) 250-50 MCG/DOSE AEPB Inhale 1 puff into the lungs 2 (two) times daily.  01/25/17   Jola Schmidt, MD  gabapentin (NEURONTIN) 800 MG tablet Take 800 mg by mouth 3 (three) times daily.    Historical Provider, MD  ibuprofen (ADVIL,MOTRIN) 200 MG tablet Take 800 mg by mouth every 6 (six) hours as needed.    Historical Provider, MD  methadone (DOLOPHINE) 10 MG/ML solution Take 150 mg by mouth daily.     Historical Provider, MD  nicotine (NICODERM CQ) 7 mg/24hr patch Place 1 patch (7 mg total) onto the skin daily. Patient not taking: Reported on 07/19/2016 04/08/16   Debbe Odea, MD  predniSONE (DELTASONE) 10 MG tablet Take 6 tablets (60 mg total) by mouth daily. 01/25/17   Jola Schmidt, MD  Respiratory Therapy Supplies (NEBULIZER) DEVI Nebulizer for home use. 08/17/15   Larene Pickett, PA-C  tiotropium (SPIRIVA HANDIHALER) 18 MCG inhalation capsule Place 1 capsule (18 mcg total) into inhaler and inhale daily. 04/08/16   Debbe Odea, MD  tiZANidine (ZANAFLEX) 4 MG tablet Take 4 mg by mouth 2 (two) times daily. 07/18/16   Historical Provider, MD    Family History Family History  Problem Relation Age of Onset  . Cancer Mother   . Emphysema Mother   . Heart failure Father   . Stroke Father     Social History Social History  Substance Use Topics  . Smoking status: Former Smoker    Packs/day: 0.25    Years: 36.00    Types: Cigarettes  . Smokeless tobacco: Never Used  . Alcohol use No     Comment: denies h/o alcohol abuse on 03/16/2015     Allergies   Aspirin   Review of Systems Review of Systems  All other systems reviewed and are negative.    Physical Exam Updated Vital Signs BP (!) 156/96 (BP Location: Right Arm)   Pulse 84   Temp 97.9 F (36.6 C) (Oral)   Resp 16   SpO2 93%   Physical Exam  Constitutional: She is oriented to person, place, and time. She appears well-developed and well-nourished. No distress.  HENT:  Head: Normocephalic and atraumatic.  Eyes: EOM are normal.  Neck: Normal range of motion.  Cardiovascular: Normal rate, regular  rhythm and normal heart sounds.   Pulmonary/Chest: Effort normal and breath sounds normal. No respiratory distress.  Abdominal: Soft. She exhibits no distension. There is no tenderness.  Musculoskeletal: Normal range of motion.  Neurological: She is alert and oriented to person, place, and time.  Skin: Skin is warm and dry.  Psychiatric: She has a normal mood and affect. Judgment normal.  Nursing note and vitals reviewed.    ED Treatments / Results  Labs (all labs ordered are listed, but only abnormal results are displayed) Labs Reviewed  I-STAT CHEM 8, ED -  Abnormal; Notable for the following:       Result Value   Potassium 3.1 (*)    BUN 5 (*)    Glucose, Bld 192 (*)    Calcium, Ion 1.11 (*)    All other components within normal limits    EKG  EKG Interpretation  Date/Time:  Friday January 25 2017 06:26:48 EDT Ventricular Rate:  80 PR Interval:    QRS Duration: 109 QT Interval:  420 QTC Calculation: 485 R Axis:   71 Text Interpretation:  Sinus rhythm non specific st t changes Confirmed by [facility]  MD, Emmaline Kluver ([id number]) on 01/25/2017 6:29:41 AM Also confirmed by [facility]  MD, APRIL ([id number]), editor Drema Pry ([id number])  on 01/25/2017 7:56:04 AM       Radiology EXAM: CHEST 2 VIEW  COMPARISON: Prior CT from 11/12/2016.  FINDINGS: Cardiac mediastinal silhouettes are within normal limits.  Lungs are hyperinflated. Severe bullous emphysema present, stable from previous. No focal infiltrates. Linear density at the left lung base most consistent with atelectasis and/or scar. No pulmonary edema or pleural effusion. No pneumothorax.  No acute osseous abnormality. Cervical ACDF noted.  IMPRESSION: 1. Severe emphysema. 2. No other active cardiopulmonary disease.  Procedures Procedures (including critical care time)  Medications Ordered in ED Medications  albuterol (PROVENTIL) (2.5 MG/3ML) 0.083% nebulizer solution 5 mg (5 mg Nebulization Given  01/25/17 0715)  albuterol (PROVENTIL) (2.5 MG/3ML) 0.083% nebulizer solution 5 mg (5 mg Nebulization Given 01/25/17 0821)  ipratropium (ATROVENT) nebulizer solution 0.5 mg (0.5 mg Nebulization Given 01/25/17 0821)  predniSONE (DELTASONE) tablet 60 mg (60 mg Oral Given 01/25/17 0819)     Initial Impression / Assessment and Plan / ED Course  I have reviewed the triage vital signs and the nursing notes.  Pertinent labs & imaging results that were available during my care of the patient were reviewed by me and considered in my medical decision making (see chart for details).    Overall well-appearing.  Patient feels much better this time.  Discharge home with treatment for COPD exacerbation.  Patient understands return to the ER for new or worsening symptoms  Final Clinical Impressions(s) / ED Diagnoses   Final diagnoses:  COPD exacerbation [facility])    New Prescriptions Discharge Medication List as of 01/25/2017  9:42 AM       Jola Schmidt, MD 01/28/17 [phone]

## 2017-04-25 ENCOUNTER — Institutional Professional Consult (permissible substitution): Payer: Medicare Other | Admitting: Pulmonary Disease

## 2017-07-04 ENCOUNTER — Other Ambulatory Visit: Payer: Self-pay | Admitting: Internal Medicine

## 2017-07-04 DIAGNOSIS — E2839 Other primary ovarian failure: Secondary | ICD-10-CM

## 2017-07-08 ENCOUNTER — Emergency Department (HOSPITAL_COMMUNITY): Payer: Medicare Other

## 2017-07-08 ENCOUNTER — Inpatient Hospital Stay (HOSPITAL_COMMUNITY)
Admission: EM | Admit: 2017-07-08 | Discharge: 2017-07-11 | DRG: 190 | Disposition: A | Discharge status: Home / Self Care | Payer: Medicare Other | Attending: Family Medicine | Admitting: Family Medicine

## 2017-07-08 ENCOUNTER — Encounter (HOSPITAL_COMMUNITY): Payer: Self-pay | Admitting: Oncology

## 2017-07-08 DIAGNOSIS — I10 Essential (primary) hypertension: Secondary | ICD-10-CM | POA: Diagnosis present

## 2017-07-08 DIAGNOSIS — F1910 Other psychoactive substance abuse, uncomplicated: Secondary | ICD-10-CM | POA: Diagnosis not present

## 2017-07-08 DIAGNOSIS — M549 Dorsalgia, unspecified: Secondary | ICD-10-CM | POA: Diagnosis present

## 2017-07-08 DIAGNOSIS — D649 Anemia, unspecified: Secondary | ICD-10-CM

## 2017-07-08 DIAGNOSIS — Z87891 Personal history of nicotine dependence: Secondary | ICD-10-CM | POA: Diagnosis not present

## 2017-07-08 DIAGNOSIS — I959 Hypotension, unspecified: Secondary | ICD-10-CM | POA: Diagnosis present

## 2017-07-08 DIAGNOSIS — Z8249 Family history of ischemic heart disease and other diseases of the circulatory system: Secondary | ICD-10-CM | POA: Diagnosis not present

## 2017-07-08 DIAGNOSIS — Z90710 Acquired absence of both cervix and uterus: Secondary | ICD-10-CM | POA: Diagnosis not present

## 2017-07-08 DIAGNOSIS — Z79891 Long term (current) use of opiate analgesic: Secondary | ICD-10-CM | POA: Diagnosis not present

## 2017-07-08 DIAGNOSIS — Z8701 Personal history of pneumonia (recurrent): Secondary | ICD-10-CM | POA: Diagnosis not present

## 2017-07-08 DIAGNOSIS — Z981 Arthrodesis status: Secondary | ICD-10-CM

## 2017-07-08 DIAGNOSIS — T380X5A Adverse effect of glucocorticoids and synthetic analogues, initial encounter: Secondary | ICD-10-CM | POA: Diagnosis present

## 2017-07-08 DIAGNOSIS — I77819 Aortic ectasia, unspecified site: Secondary | ICD-10-CM | POA: Diagnosis present

## 2017-07-08 DIAGNOSIS — R739 Hyperglycemia, unspecified: Secondary | ICD-10-CM | POA: Diagnosis present

## 2017-07-08 DIAGNOSIS — Z823 Family history of stroke: Secondary | ICD-10-CM

## 2017-07-08 DIAGNOSIS — Z886 Allergy status to analgesic agent status: Secondary | ICD-10-CM | POA: Diagnosis not present

## 2017-07-08 DIAGNOSIS — E43 Unspecified severe protein-calorie malnutrition: Secondary | ICD-10-CM | POA: Diagnosis present

## 2017-07-08 DIAGNOSIS — Z809 Family history of malignant neoplasm, unspecified: Secondary | ICD-10-CM

## 2017-07-08 DIAGNOSIS — Z9889 Other specified postprocedural states: Secondary | ICD-10-CM | POA: Diagnosis not present

## 2017-07-08 DIAGNOSIS — N289 Disorder of kidney and ureter, unspecified: Secondary | ICD-10-CM | POA: Diagnosis not present

## 2017-07-08 DIAGNOSIS — N179 Acute kidney failure, unspecified: Secondary | ICD-10-CM | POA: Diagnosis present

## 2017-07-08 DIAGNOSIS — Z7951 Long term (current) use of inhaled steroids: Secondary | ICD-10-CM | POA: Diagnosis not present

## 2017-07-08 DIAGNOSIS — Z22322 Carrier or suspected carrier of Methicillin resistant Staphylococcus aureus: Secondary | ICD-10-CM | POA: Diagnosis not present

## 2017-07-08 DIAGNOSIS — Z79899 Other long term (current) drug therapy: Secondary | ICD-10-CM | POA: Diagnosis not present

## 2017-07-08 DIAGNOSIS — M5136 Other intervertebral disc degeneration, lumbar region: Secondary | ICD-10-CM | POA: Diagnosis present

## 2017-07-08 DIAGNOSIS — Z825 Family history of asthma and other chronic lower respiratory diseases: Secondary | ICD-10-CM | POA: Diagnosis not present

## 2017-07-08 DIAGNOSIS — J9610 Chronic respiratory failure, unspecified whether with hypoxia or hypercapnia: Secondary | ICD-10-CM | POA: Diagnosis present

## 2017-07-08 DIAGNOSIS — J441 Chronic obstructive pulmonary disease with (acute) exacerbation: Secondary | ICD-10-CM | POA: Diagnosis present

## 2017-07-08 DIAGNOSIS — G8929 Other chronic pain: Secondary | ICD-10-CM | POA: Diagnosis present

## 2017-07-08 DIAGNOSIS — R64 Cachexia: Secondary | ICD-10-CM | POA: Diagnosis present

## 2017-07-08 DIAGNOSIS — Z87442 Personal history of urinary calculi: Secondary | ICD-10-CM

## 2017-07-08 DIAGNOSIS — Z9981 Dependence on supplemental oxygen: Secondary | ICD-10-CM

## 2017-07-08 DIAGNOSIS — F17200 Nicotine dependence, unspecified, uncomplicated: Secondary | ICD-10-CM | POA: Diagnosis present

## 2017-07-08 DIAGNOSIS — R0682 Tachypnea, not elsewhere classified: Secondary | ICD-10-CM | POA: Diagnosis present

## 2017-07-08 DIAGNOSIS — Z681 Body mass index (BMI) 19 or less, adult: Secondary | ICD-10-CM

## 2017-07-08 DIAGNOSIS — E860 Dehydration: Secondary | ICD-10-CM | POA: Diagnosis present

## 2017-07-08 DIAGNOSIS — Z8711 Personal history of peptic ulcer disease: Secondary | ICD-10-CM

## 2017-07-08 DIAGNOSIS — D72829 Elevated white blood cell count, unspecified: Secondary | ICD-10-CM | POA: Diagnosis present

## 2017-07-08 HISTORY — DX: Carrier or suspected carrier of methicillin resistant Staphylococcus aureus: Z22.322

## 2017-07-08 HISTORY — DX: Aortic ectasia, unspecified site: I77.819

## 2017-07-08 LAB — CBC WITH DIFFERENTIAL/PLATELET
BASOS ABS: 0 10*3/uL (ref 0.0–0.1)
BASOS PCT: 0 %
EOS ABS: 0.1 10*3/uL (ref 0.0–0.7)
EOS PCT: 1 %
HEMATOCRIT: 36.1 % (ref 36.0–46.0)
Hemoglobin: 11.6 g/dL — ABNORMAL LOW (ref 12.0–15.0)
Lymphocytes Relative: 13 %
Lymphs Abs: 1.7 10*3/uL (ref 0.7–4.0)
MCH: 30.5 pg (ref 26.0–34.0)
MCHC: 32.1 g/dL (ref 30.0–36.0)
MCV: 95 fL (ref 78.0–100.0)
MONO ABS: 0.5 10*3/uL (ref 0.1–1.0)
MONOS PCT: 4 %
NEUTROS ABS: 10.4 10*3/uL — AB (ref 1.7–7.7)
Neutrophils Relative %: 82 %
PLATELETS: 189 10*3/uL (ref 150–400)
RBC: 3.8 MIL/uL — ABNORMAL LOW (ref 3.87–5.11)
RDW: 14.1 % (ref 11.5–15.5)
WBC: 12.7 10*3/uL — ABNORMAL HIGH (ref 4.0–10.5)

## 2017-07-08 LAB — COMPREHENSIVE METABOLIC PANEL
ALBUMIN: 3.8 g/dL (ref 3.5–5.0)
ALT: 14 U/L (ref 14–54)
ANION GAP: 8 (ref 5–15)
AST: 21 U/L (ref 15–41)
Alkaline Phosphatase: 109 U/L (ref 38–126)
BUN: 11 mg/dL (ref 6–20)
CALCIUM: 8.8 mg/dL — AB (ref 8.9–10.3)
CHLORIDE: 110 mmol/L (ref 101–111)
CO2: 21 mmol/L — AB (ref 22–32)
Creatinine, Ser: 1.2 mg/dL — ABNORMAL HIGH (ref 0.44–1.00)
GFR, EST AFRICAN AMERICAN: 56 mL/min — AB (ref >60)
GFR, EST NON AFRICAN AMERICAN: 48 mL/min — AB (ref >60)
Glucose, Bld: 123 mg/dL — ABNORMAL HIGH (ref 65–99)
Potassium: 3.7 mmol/L (ref 3.5–5.1)
SODIUM: 139 mmol/L (ref 135–145)
Total Bilirubin: 0.5 mg/dL (ref 0.3–1.2)
Total Protein: 7 g/dL (ref 6.5–8.1)

## 2017-07-08 LAB — MRSA PCR SCREENING: MRSA by PCR: POSITIVE — AB

## 2017-07-08 LAB — TROPONIN I: Troponin I: <0.03 ng/mL (ref <0.03)

## 2017-07-08 LAB — LIPASE, BLOOD: Lipase: 26 U/L (ref 11–51)

## 2017-07-08 LAB — D-DIMER, QUANTITATIVE (NOT AT ARMC): D DIMER QUANT: 0.65 ug{FEU}/mL — AB (ref 0.00–0.50)

## 2017-07-08 LAB — HIV ANTIBODY (ROUTINE TESTING W REFLEX): HIV SCREEN 4TH GENERATION: NONREACTIVE

## 2017-07-08 MED ORDER — IPRATROPIUM-ALBUTEROL 0.5-2.5 (3) MG/3ML IN SOLN
3.0000 mL | Freq: Once | RESPIRATORY_TRACT | Status: AC
  Administered 2017-07-08: 3 mL via RESPIRATORY_TRACT
  Filled 2017-07-08: qty 3

## 2017-07-08 MED ORDER — TIOTROPIUM BROMIDE MONOHYDRATE 18 MCG IN CAPS
18.0000 ug | ORAL_CAPSULE | Freq: Every day | RESPIRATORY_TRACT | Status: DC
  Administered 2017-07-10 – 2017-07-11 (×2): 18 ug via RESPIRATORY_TRACT
  Filled 2017-07-08 (×2): qty 5

## 2017-07-08 MED ORDER — LEVOFLOXACIN IN D5W 500 MG/100ML IV SOLN
500.0000 mg | Freq: Once | INTRAVENOUS | Status: AC
  Administered 2017-07-08: 500 mg via INTRAVENOUS
  Filled 2017-07-08: qty 100

## 2017-07-08 MED ORDER — MUPIROCIN 2 % EX OINT
1.0000 "application " | TOPICAL_OINTMENT | Freq: Two times a day (BID) | CUTANEOUS | Status: DC
  Administered 2017-07-08 – 2017-07-11 (×5): 1 via NASAL
  Filled 2017-07-08 (×2): qty 22

## 2017-07-08 MED ORDER — CHLORHEXIDINE GLUCONATE CLOTH 2 % EX PADS
6.0000 | MEDICATED_PAD | Freq: Every day | CUTANEOUS | Status: DC
  Administered 2017-07-11: 6 via TOPICAL

## 2017-07-08 MED ORDER — GABAPENTIN 400 MG PO CAPS
800.0000 mg | ORAL_CAPSULE | Freq: Four times a day (QID) | ORAL | Status: DC
  Administered 2017-07-08 – 2017-07-11 (×7): 800 mg via ORAL
  Filled 2017-07-08 (×9): qty 2

## 2017-07-08 MED ORDER — ENOXAPARIN SODIUM 30 MG/0.3ML ~~LOC~~ SOLN
30.0000 mg | SUBCUTANEOUS | Status: DC
  Administered 2017-07-08 – 2017-07-11 (×4): 30 mg via SUBCUTANEOUS
  Filled 2017-07-08 (×5): qty 0.3

## 2017-07-08 MED ORDER — SODIUM CHLORIDE 0.9% FLUSH: 3.0000 mL | INTRAVENOUS | Status: DC | PRN

## 2017-07-08 MED ORDER — ONDANSETRON HCL 4 MG/2ML IJ SOLN
4.0000 mg | Freq: Four times a day (QID) | INTRAMUSCULAR | Status: DC | PRN
  Administered 2017-07-08 – 2017-07-09 (×2): 4 mg via INTRAVENOUS
  Filled 2017-07-08 (×3): qty 2

## 2017-07-08 MED ORDER — ACETAMINOPHEN 325 MG PO TABS
650.0000 mg | ORAL_TABLET | Freq: Four times a day (QID) | ORAL | Status: DC | PRN
  Administered 2017-07-08 – 2017-07-10 (×2): 650 mg via ORAL
  Filled 2017-07-08 (×2): qty 2

## 2017-07-08 MED ORDER — POTASSIUM CHLORIDE CRYS ER 10 MEQ PO TBCR
10.0000 meq | EXTENDED_RELEASE_TABLET | Freq: Every day | ORAL | Status: DC
  Administered 2017-07-09 – 2017-07-11 (×3): 10 meq via ORAL
  Filled 2017-07-08 (×4): qty 1

## 2017-07-08 MED ORDER — SODIUM CHLORIDE 0.9% FLUSH
3.0000 mL | Freq: Two times a day (BID) | INTRAVENOUS | Status: DC
  Administered 2017-07-08 – 2017-07-10 (×5): 3 mL via INTRAVENOUS

## 2017-07-08 MED ORDER — ONDANSETRON HCL 4 MG/2ML IJ SOLN
4.0000 mg | Freq: Once | INTRAMUSCULAR | Status: AC
Start: 2017-07-08 — End: 2017-07-08
  Administered 2017-07-08: 4 mg via INTRAVENOUS

## 2017-07-08 MED ORDER — TIZANIDINE HCL 2 MG PO TABS
4.0000 mg | ORAL_TABLET | Freq: Two times a day (BID) | ORAL | Status: DC | PRN
  Administered 2017-07-10: 4 mg via ORAL
  Filled 2017-07-08: qty 2

## 2017-07-08 MED ORDER — AMLODIPINE BESYLATE 5 MG PO TABS
5.0000 mg | ORAL_TABLET | Freq: Every day | ORAL | Status: DC
Start: 2017-07-08 — End: 2017-07-11
  Administered 2017-07-08 – 2017-07-11 (×4): 5 mg via ORAL
  Filled 2017-07-08 (×5): qty 1

## 2017-07-08 MED ORDER — MAGNESIUM SULFATE 2 GM/50ML IV SOLN
2.0000 g | Freq: Once | INTRAVENOUS | Status: AC
  Administered 2017-07-08: 2 g via INTRAVENOUS
  Filled 2017-07-08: qty 50

## 2017-07-08 MED ORDER — ACETAMINOPHEN 650 MG RE SUPP: 650.0000 mg | Freq: Four times a day (QID) | RECTAL | Status: DC | PRN

## 2017-07-08 MED ORDER — MOMETASONE FURO-FORMOTEROL FUM 200-5 MCG/ACT IN AERO
2.0000 | INHALATION_SPRAY | Freq: Two times a day (BID) | RESPIRATORY_TRACT | Status: DC
  Administered 2017-07-09 – 2017-07-11 (×4): 2 via RESPIRATORY_TRACT
  Filled 2017-07-08 (×2): qty 8.8

## 2017-07-08 MED ORDER — DULOXETINE HCL 60 MG PO CPEP
60.0000 mg | ORAL_CAPSULE | Freq: Two times a day (BID) | ORAL | Status: DC
  Administered 2017-07-09 – 2017-07-11 (×5): 60 mg via ORAL
  Filled 2017-07-08 (×9): qty 1

## 2017-07-08 MED ORDER — IOPAMIDOL (ISOVUE-370) INJECTION 76%
INTRAVENOUS | Status: AC
  Administered 2017-07-08: 100 mL
  Filled 2017-07-08: qty 100

## 2017-07-08 MED ORDER — SODIUM CHLORIDE 0.9 % IV SOLN: 250.0000 mL | INTRAVENOUS | Status: DC | PRN

## 2017-07-08 MED ORDER — METHADONE HCL 10 MG PO TABS
130.0000 mg | ORAL_TABLET | Freq: Every day | ORAL | Status: DC
  Administered 2017-07-08 – 2017-07-11 (×4): 130 mg via ORAL
  Filled 2017-07-08 (×4): qty 13

## 2017-07-08 MED ORDER — LEVOFLOXACIN IN D5W 500 MG/100ML IV SOLN
500.0000 mg | INTRAVENOUS | Status: DC
  Administered 2017-07-09 – 2017-07-10 (×2): 500 mg via INTRAVENOUS
  Filled 2017-07-08 (×2): qty 100

## 2017-07-08 MED ORDER — ALBUTEROL SULFATE HFA 108 (90 BASE) MCG/ACT IN AERS: 2.0000 | INHALATION_SPRAY | Freq: Four times a day (QID) | RESPIRATORY_TRACT | Status: DC | PRN

## 2017-07-08 MED ORDER — METHYLPREDNISOLONE SODIUM SUCC 125 MG IJ SOLR
80.0000 mg | Freq: Three times a day (TID) | INTRAMUSCULAR | Status: DC
  Administered 2017-07-08 – 2017-07-09 (×4): 80 mg via INTRAVENOUS
  Filled 2017-07-08 (×4): qty 2

## 2017-07-08 MED ORDER — AMLODIPINE BESYLATE 5 MG PO TABS: 2.5000 mg | ORAL_TABLET | Freq: Every day | ORAL | Status: DC

## 2017-07-08 MED ORDER — ALBUTEROL SULFATE (2.5 MG/3ML) 0.083% IN NEBU: 2.5000 mg | INHALATION_SOLUTION | Freq: Four times a day (QID) | RESPIRATORY_TRACT | Status: DC | PRN

## 2017-07-08 MED ORDER — HYDRALAZINE HCL 20 MG/ML IJ SOLN
5.0000 mg | INTRAMUSCULAR | Status: DC | PRN
  Administered 2017-07-08: 5 mg via INTRAVENOUS
  Filled 2017-07-08 (×2): qty 1

## 2017-07-08 MED ORDER — ALBUTEROL SULFATE (2.5 MG/3ML) 0.083% IN NEBU
2.5000 mg | INHALATION_SOLUTION | Freq: Four times a day (QID) | RESPIRATORY_TRACT | Status: DC
  Administered 2017-07-08 – 2017-07-09 (×4): 2.5 mg via RESPIRATORY_TRACT
  Filled 2017-07-08 (×6): qty 3

## 2017-07-08 NOTE — ED Notes (Signed)
Pt coughing, vomited large amount approx 400cc, was incontinent of urine also-- linens changed, assisted pt back into bed. Stressed to pt to stay in bed, and keep monitor attached to self, lungs-- diminshed,

## 2017-07-08 NOTE — Progress Notes (Signed)
Progress Note    Emily Moran  QQP:[id number] DOB: Oct 25, 1955  DOA: 07/08/2017 PCP: Nolene Ebbs, MD    Brief Narrative:   Chief complaint: Follow-up COPD exacerbation  Medical records reviewed and are as summarized below:  Emily Moran is an 61 y.o. female with a PMH of substance abuse, hepatitis C, and COPD who was admitted 07/08/17 for treatment of a COPD exacerbation.  Assessment/Plan:   Principal Problem:   COPD exacerbation (HCC) Placed on Solu-Medrol 80 mg IV every 8 hours, Levaquin 500 milligrams IV daily, nebulized bronchodilators and Spiriva. Febrile with tachypnea.  Active Problems:   Ectatic aorta Patient advised that she will need follow-up imaging yearly for this.    Substance abuse ([place]) Continue methadone. Follow-up HIV testing.    HTN (hypertension) Suboptimally controlled, currently on Norvasc 2.5 mg daily. Will increase to 5 mg daily. Add Hydralazine PRN.    Hyperglycemia Possibly steroid-induced.    Anemia Mild.    Renal insufficiency Baseline creatinine is 0.9. Current creatinine 1.2. Likely dehydration. Hydrate and monitor.    Underweight Body mass index is 18.24 kg/m. Dietitian consultation for assessment of nutritional status.   Family Communication/Anticipated D/C date and plan/Code Status   DVT prophylaxis: Lovenox ordered. Code Status: Full Code.  Family Communication: No family at bedside. Disposition Plan: Home when respiratory status improved.   Medical Consultants:    None.   Anti-Infectives:    Levaquin 07/08/17--->  Subjective:   Very short of breath with cough productive of thick yellow/blood tinged sputum. Poor appetite.   Objective:    Vitals:   07/08/17 0700 07/08/17 0747 07/08/17 0800 07/08/17 0900  BP: (!) 164/105 (!) 175/109 (!) 184/116 (!) 190/112  Pulse: 92 93 95 91  Resp:  12 (!) 26 15  Temp:  (!) 100.9 F (38.3 C)    TempSrc:  Rectal    SpO2: 98% 99% 98% 98%  Weight:      Height:          Intake/Output Summary (Last 24 hours) at 07/08/17 0942 Last data filed at 07/08/17 0202  Gross per 24 hour  Intake               50 ml  Output                0 ml  Net               50 ml   Filed Weights   07/08/17 0111  Weight: 51.3 kg (113 lb)    Exam: General: Ill-appearing, willfully disengaged. Appears older than stated age and cachectic. Cardiovascular: Heart sounds show a regular rate, and rhythm. No gallops or rubs. No murmurs. No JVD. Lungs: Expiratory wheezes and scattered rhonchi throughout. Abdomen: Soft, nontender, nondistended with normal active bowel sounds. No masses. No hepatosplenomegaly. Neurological: Alert and oriented 3. Moves all extremities 4 with equal strength. Cranial nerves II through XII grossly intact. Skin: Warm and dry. No rashes or lesions. Extremities: No clubbing or cyanosis. No edema. Pedal pulses 2+. Psychiatric: Mood and affect are depressed/apathetic. Insight and judgment are poor.  Data Reviewed:   I have personally reviewed following labs and imaging studies:  Labs: Labs show the following:   Basic Metabolic Panel:  Recent Labs Lab 07/08/17 0116  NA 139  K 3.7  CL 110  CO2 21*  GLUCOSE 123*  BUN 11  CREATININE 1.20*  CALCIUM 8.8*   GFR Estimated Creatinine Clearance: 40.9 mL/min (A) (by C-G  formula based on SCr of 1.2 mg/dL (H)). Liver Function Tests:  Recent Labs Lab 07/08/17 0116  AST 21  ALT 14  ALKPHOS 109  BILITOT 0.5  PROT 7.0  ALBUMIN 3.8    Recent Labs Lab 07/08/17 0116  LIPASE 26   CBC:  Recent Labs Lab 07/08/17 0116  WBC 12.7*  NEUTROABS 10.4*  HGB 11.6*  HCT 36.1  MCV 95.0  PLT 189   Cardiac Enzymes:  Recent Labs Lab 07/08/17 0116  TROPONINI <0.03   D-Dimer:  Recent Labs  07/08/17 0116  DDIMER 0.65*    Microbiology No results found for this or any previous visit (from the past 240 hour(s)).  Procedures and diagnostic studies:  07/08/17: Dg Chest 2 View: My  independent review of the image shows: Severe hyperexpansion of the lungs. Infiltrates the bilateral bases.     07/08/17: Ct Angio Chest Pe W And/or Wo Contrast:  No evidence of pulmonary embolism. Severe emphysematous disease with patchy areas of atelectasis/ scarring unchanged. New subtle heterogeneous opacification over the left midlung which may be due to an acute infectious or inflammatory process. Very minimal atherosclerotic coronary artery disease. Mild ectasia of the ascending thoracic aorta unchanged measuring 3.4 cm. Recommend annual imaging followup by CTA or MRA.   Medications:   . albuterol  2.5 mg Nebulization Q6H  . amLODipine  2.5 mg Oral Daily  . DULoxetine  60 mg Oral BID  . enoxaparin (LOVENOX) injection  30 mg Subcutaneous Q24H  . gabapentin  800 mg Oral QID  . methadone  130 mg Oral Q0600  . methylPREDNISolone (SOLU-MEDROL) injection  80 mg Intravenous Q8H  . mometasone-formoterol  2 puff Inhalation BID  . potassium chloride  10 mEq Oral Daily  . sodium chloride flush  3 mL Intravenous Q12H  . tiotropium  18 mcg Inhalation Daily   Continuous Infusions: . sodium chloride    . [START ON 07/09/2017] levofloxacin (LEVAQUIN) IV       LOS: 0 days   RAMA,CHRISTINA  [facility] Pager [phone]. If unable to reach me by pager, please call my cell phone at [phone].  *Please refer to [email or web address], password [email or web address] to get updated schedule on who will round on this patient, as hospitalists switch teams weekly. If 7PM-7AM, please contact night-coverage at [email or web address], password [email or web address] for any overnight needs.  07/08/2017, 9:42 AM

## 2017-07-08 NOTE — ED Notes (Signed)
Patient transported to X-ray 

## 2017-07-08 NOTE — ED Notes (Signed)
Pt disconnected her cardiac monitor, walked down hall to nurses station then to bathroom requesting more coke and Methadone.  Strong odor of cigarettes now present in bathroom and in patients room, pt denies smoking in room or bathroom

## 2017-07-08 NOTE — ED Triage Notes (Signed)
Pt bib [facility] from home d/t COPD exacerbation.  Pt given 125 mg of solumedrol, 15 mg albuterol, 1.0 mgof atrovent PTA.  O2 sats.  Pt has productive cough. Per EMS pt's work of breathing has improved significantly.

## 2017-07-08 NOTE — ED Provider Notes (Signed)
[facility] DEPT Provider Note   CSN: [id number] Arrival date & time: 07/08/17  0048     History   Chief Complaint Chief Complaint  Patient presents with  . Shortness of Breath    HPI Emily Moran is a 61 y.o. female.  Patient presents with 2 day history of difficulty breathing with coughing and wheezing. History of COPD on home oxygen 2.5 L. EMS gave 15 mg of albuterol, Atrovent and solu- Medrol. Patient feels improved. She states she has had sick contacts at home. Fever to 100. Cough is productive of clear and yellow mucus. Denies any leg pain or leg swelling. Some right-sided chest pain with coughing only. No abdominal pain, nausea vomiting or diarrhea. She continues to smoke. States she gets pneumonia about every year and worries that she has it again.   The history is provided by the patient and the EMS personnel.    Past Medical History:  Diagnosis Date  . Anemia    "as a child"  . Anxiety   . Arthritis    "shoulders; back" (03/16/2015)  . Chronic back pain   . Chronic bronchitis ([place])    "I basically keep it" (03/16/2015)  . Chronic pain   . COPD (chronic obstructive pulmonary disease) ([place])   . DDD (degenerative disc disease), lumbar   . Degenerative disc disease   . Depression   . Family history of adverse reaction to anesthesia    "my mother had an allergic reaction when she had kidney removed back in the '60's or '70's""  . Headache    "monthly; need glasses; comes on when I'm stressed or get too tired" (03/16/2015)  . Hepatitis C   . History of stomach ulcers   . Hypertension   . Kidney stone   . On home O2    "~ 3L once/wk and prn" (03/16/2015)  . Pain management   . Pneumonia    "@ least once/yr for the past 8 yrs" (03/16/2015)  . Spinal stenosis   . Tobacco use     Patient Active Problem List   Diagnosis Date Noted  . COPD exacerbation ([place]) 07/19/2016  . Lung nodule 07/19/2016  . Septic shock ([place]) 04/05/2016  . HCAP (healthcare-associated  pneumonia) 04/05/2016  . CAP (community acquired pneumonia) 03/07/2016  . Acute respiratory failure with hypoxia ([place]) 03/16/2015  . COPD (chronic obstructive pulmonary disease) ([place]) 03/16/2015  . HTN (hypertension) 03/16/2015  . Leukocytosis 03/16/2015  . Hyperglycemia 03/16/2015  . Chronic pain syndrome 03/16/2015  . Hepatitis C 12/18/2007  . TOBACCO ABUSE 09/11/2007  . Substance abuse ([place]) 09/11/2007    Past Surgical History:  Procedure Laterality Date  . ANTERIOR CERVICAL DECOMP/DISCECTOMY FUSION  2002  . BACK SURGERY    . FEMUR IM NAIL Right 11/28/2012   Procedure: INTRAMEDULLARY (IM) RETROGRADE FEMORAL NAILING wants jackson table , c-arm and biomet ;  Surgeon: Mauri Pole, MD;  Location: [facility]S;  Service: Orthopedics;  Laterality: Right;  . FRACTURE SURGERY    . INCONTINENCE SURGERY  1998  . TOTAL ABDOMINAL HYSTERECTOMY  1998    OB History    No data available       Home Medications    Prior to Admission medications   Medication Sig Start Date End Date Taking? Authorizing Provider  albuterol (PROVENTIL HFA;VENTOLIN HFA) 108 (90 Base) MCG/ACT inhaler Inhale 2 puffs into the lungs every 6 (six) hours as needed for wheezing or shortness of breath. 01/25/17   Jola Schmidt, MD  albuterol (  PROVENTIL) (2.5 MG/3ML) 0.083% nebulizer solution Take 3 mLs (2.5 mg total) by nebulization every 6 (six) hours as needed for wheezing or shortness of breath (buy this only if you cannot afford the Duoneb). 01/25/17   Jola Schmidt, MD  amLODipine (NORVASC) 2.5 MG tablet Take 1 tablet (2.5 mg total) by mouth daily. 07/23/16   Velvet Bathe, MD  azithromycin (ZITHROMAX Z-PAK) 250 MG tablet Take 1 tablet (250 mg total) by mouth daily. 2 pills (500mg ) day 1 then 1 pill (250mg ) days 2-5. 11/12/16   Virgel Manifold, MD  dextromethorphan-guaiFENesin [facility] DM) 30-600 MG 12hr tablet Take 1 tablet by mouth 2 (two) times daily. Patient not taking: Reported on 07/19/2016 04/08/16   Debbe Odea, MD    DULoxetine (CYMBALTA) 60 MG capsule Take 60 mg by mouth 2 (two) times daily.    [provider]  Fluticasone-Salmeterol (ADVAIR DISKUS) 250-50 MCG/DOSE AEPB Inhale 1 puff into the lungs 2 (two) times daily. 01/25/17   Jola Schmidt, MD  gabapentin (NEURONTIN) 800 MG tablet Take 800 mg by mouth 3 (three) times daily.    [provider]  ibuprofen (ADVIL,MOTRIN) 200 MG tablet Take 800 mg by mouth every 6 (six) hours as needed.    [provider]  methadone (DOLOPHINE) 10 MG/ML solution Take 150 mg by mouth daily.     [provider]  nicotine (NICODERM CQ) 7 mg/24hr patch Place 1 patch (7 mg total) onto the skin daily. Patient not taking: Reported on 07/19/2016 04/08/16   Debbe Odea, MD  predniSONE (DELTASONE) 10 MG tablet Take 6 tablets (60 mg total) by mouth daily. 01/25/17   Jola Schmidt, MD  Respiratory Therapy Supplies (NEBULIZER) [place] for home use. 08/17/15   Larene Pickett, PA-C  tiotropium (SPIRIVA HANDIHALER) 18 MCG inhalation capsule Place 1 capsule (18 mcg total) into inhaler and inhale daily. 04/08/16   Debbe Odea, MD  tiZANidine (ZANAFLEX) 4 MG tablet Take 4 mg by mouth 2 (two) times daily. 07/18/16   [provider]    Family History Family History  Problem Relation Age of Onset  . Cancer Mother   . Emphysema Mother   . Heart failure Father   . Stroke Father     Social History Social History  Substance Use Topics  . Smoking status: Former Smoker    Packs/day: 0.25    Years: 36.00    Types: Cigarettes  . Smokeless tobacco: Never Used  . Alcohol use No     Comment: denies h/o alcohol abuse on 03/16/2015     Allergies   Aspirin   Review of Systems Review of Systems  Constitutional: Negative for chills, fatigue and fever.  HENT: Negative for congestion and rhinorrhea.   Eyes: Negative for visual disturbance.  Respiratory: Positive for cough, chest tightness and shortness of breath.   Cardiovascular: Negative  for chest pain and leg swelling.  Gastrointestinal: Negative for abdominal pain, nausea and vomiting.  Genitourinary: Negative for dysuria, hematuria, vaginal bleeding and vaginal discharge.  Musculoskeletal: Positive for arthralgias and myalgias.  Skin: Negative for rash.  Neurological: Positive for weakness. Negative for dizziness and headaches.    all other systems are negative except as noted in the HPI and PMH.    Physical Exam Updated Vital Signs BP (!) 143/97   Pulse 94   Temp 98.9 F (37.2 C) (Oral)   Resp (!) 28   Ht 5\' 6"  (1.676 m)   Wt 51.3 kg (113 lb)   SpO2 97%  BMI 18.24 kg/m   Physical Exam  Constitutional: She is oriented to person, place, and time. She appears well-developed and well-nourished. She appears distressed.  Mild increased work of breathing.  HENT:  Head: Normocephalic and atraumatic.  Mouth/Throat: Oropharynx is clear and moist. No oropharyngeal exudate.  Eyes: Pupils are equal, round, and reactive to light. Conjunctivae and EOM are normal.  Neck: Normal range of motion. Neck supple.  No meningismus.  Cardiovascular: Normal rate, regular rhythm, normal heart sounds and intact distal pulses.  Exam reveals no gallop.   No murmur heard. Pulmonary/Chest: Effort normal. No respiratory distress. She has wheezes.  Decreased breath sounds throughout  Abdominal: Soft. There is no tenderness. There is no rebound and no guarding.  Musculoskeletal: Normal range of motion. She exhibits no edema or tenderness.  Neurological: She is alert and oriented to person, place, and time. No cranial nerve deficit. She exhibits normal muscle tone. Coordination normal.   5/5 strength throughout. CN 2-12 intact.Equal grip strength.   Skin: Skin is warm. Capillary refill takes less than 2 seconds.  Psychiatric: She has a normal mood and affect. Her behavior is normal.  Nursing note and vitals reviewed.    ED Treatments / Results  Labs (all labs ordered are listed, but  only abnormal results are displayed) Labs Reviewed  CBC WITH DIFFERENTIAL/PLATELET - Abnormal; Notable for the following:       Result Value   WBC 12.7 (*)    RBC 3.80 (*)    Hemoglobin 11.6 (*)    Neutro Abs 10.4 (*)    All other components within normal limits  COMPREHENSIVE METABOLIC PANEL - Abnormal; Notable for the following:    CO2 21 (*)    Glucose, Bld 123 (*)    Creatinine, Ser 1.20 (*)    Calcium 8.8 (*)    GFR calc non Af Amer 48 (*)    GFR calc Af Amer 56 (*)    All other components within normal limits  D-DIMER, QUANTITATIVE (NOT AT [facility]) - Abnormal; Notable for the following:    D-Dimer, Quant 0.65 (*)    All other components within normal limits  LIPASE, BLOOD  TROPONIN I  HIV ANTIBODY (ROUTINE TESTING)    EKG  EKG Interpretation None       Radiology Dg Chest 2 View  Result Date: 07/08/2017 CLINICAL DATA:  61 y/o  F; COPD exacerbation and productive cough. EXAM: CHEST  2 VIEW COMPARISON:  02/02/2017 chest radiograph FINDINGS: Severe emphysema with bullous changes of the right mid and upper lung zone. Increase hazy opacification of the lung bases and pulmonary markings. No pleural effusion or pneumothorax. Stable normal cardiac silhouette. Aortic atherosclerosis with calcification. IMPRESSION: Severe emphysema. Increased hazy opacification of lung bases and pulmonary markings may represent superimposed pneumonitis or pulmonary edema. Aortic atherosclerosis. Electronically Signed   By: Kristine Garbe M.D.   On: 07/08/2017 02:06   Ct Angio Chest Pe W And/or Wo Contrast  Result Date: 07/08/2017 CLINICAL DATA:  Productive cough, COPD exacerbation. EXAM: CT ANGIOGRAPHY CHEST WITH CONTRAST TECHNIQUE: Multidetector CT imaging of the chest was performed using the standard protocol during bolus administration of intravenous contrast. Multiplanar CT image reconstructions and MIPs were obtained to evaluate the vascular anatomy. CONTRAST:  65 mL Isovue 370 IV.  COMPARISON:  07/19/2016 and 05/07/2014 FINDINGS: Cardiovascular: Heart is within normal in size. Minimal calcified plaque over the left main and left anterior descending coronary artery as well as the right coronary artery. Mild calcified plaque over  the thoracic aorta. Ectasia of the ascending thoracic aorta measuring 3.4 cm in AP diameter unchanged. No evidence of pulmonary emboli. Mediastinum/Nodes: 1 cm left hilar lymph node likely reactive. No significant mediastinal adenopathy. Remaining mediastinal structures are unremarkable. Lungs/Pleura: Lungs are adequately inflated demonstrate severe emphysematous disease right worse than left. There is mild patchy areas of airspace density over the right middle lobe and right upper lobe are likely atelectasis/ scarring. Minimal lingular scarring unchanged. Subtle heterogeneous opacification over the left midlung which is new and may be due to acute infectious or inflammatory process. Stable 2-3 mm nodule over the left lower lobe. Bronchiectatic change over the right middle lobe. Upper Abdomen: Unremarkable. Musculoskeletal: Degenerative change of the spine. Review of the MIP images confirms the above findings. IMPRESSION: No evidence of pulmonary embolism. Severe emphysematous disease with patchy areas of atelectasis/ scarring unchanged. New subtle heterogeneous opacification over the left midlung which may be due to an acute infectious or inflammatory process. Very minimal atherosclerotic coronary artery disease. Mild ectasia of the ascending thoracic aorta unchanged measuring 3.4 cm. Recommend annual imaging followup by CTA or MRA. This recommendation follows 2010 ACCF/AHA/AATS/ACR/ASA/SCA/SCAI/SIR/STS/SVM Guidelines for the Diagnosis and Management of Patients with Thoracic Aortic Disease. Circulation.2010; 121: Z767-H419. Aortic Atherosclerosis (ICD10-I70.0) and Emphysema (ICD10-J43.9). Electronically Signed   By: Marin Olp M.D.   On: 07/08/2017 02:58     Procedures Procedures (including critical care time)  Medications Ordered in ED Medications - No data to display   Initial Impression / Assessment and Plan / ED Course  I have reviewed the triage vital signs and the nursing notes.  Pertinent labs & imaging results that were available during my care of the patient were reviewed by me and considered in my medical decision making (see chart for details).    Difficulty breathing with coughing and wheezing x1 day.  Subjective fever at home.   Nebs, steroids, Magnesium Minimal wheezing on initial presentation.   CXR with basilar infiltrates. Antibiotics added. CTPE negative, does show left infiltrate.  Work of breathing persists with coughing and tachypnea. Unable to tolerate ambulation challenge. Admission d/w Dr. Maudie Mercury.   Final Clinical Impressions(s) / ED Diagnoses   Final diagnoses:  COPD exacerbation ([place])    New Prescriptions New Prescriptions   No medications on file     Ezequiel Essex, MD 07/08/17 1742

## 2017-07-08 NOTE — ED Notes (Signed)
Pts daily methadone retimed and ordered from pharmacy. Pt made aware

## 2017-07-08 NOTE — ED Notes (Signed)
Patient transported to CT 

## 2017-07-08 NOTE — ED Notes (Signed)
Pt c/o nausea.  

## 2017-07-08 NOTE — H&P (Addendum)
TRH H&P   Patient Demographics:    Emily Moran, is a 61 y.o. female  MRN: [id number]   DOB - 1956/03/22  Admit Date - 07/08/2017  Outpatient Primary MD for the patient is Nolene Ebbs, MD  Referring MD/NP/PA:  Reather Converse  Outpatient Specialists:   Patient coming from: home  Chief Complaint  Patient presents with  . Shortness of Breath      HPI:    Emily Moran  is a 61 y.o. female, w substance abuse, Hepatitis C, Copd apparently presents c/o dyspnea since Friday.  Was worse yesterday and therefore presented to ED .  Pt notes slight cough,  Denies fever, chills, cp, pap, n/v, diarrhea, brbpr, black stool.   In ED,  CXR negative, Wbc 12.7, Hgb 11.6 , Bun/creat 11/1.2,  Trop <0.03,  Pt will be admitted for Copd exacerbation   Review of systems:    In addition to the HPI above,  No Fever-chills, No Headache, No changes with Vision or hearing, No problems swallowing food or Liquids, No Chest pain No Abdominal pain, No Nausea or Vommitting, Bowel movements are regular, No Blood in stool or Urine, No dysuria, No new skin rashes or bruises, No new joints pains-aches,  No new weakness, tingling, numbness in any extremity, No recent weight gain or loss, No polyuria, polydypsia or polyphagia, No significant Mental Stressors.  A full 10 point Review of Systems was done, except as stated above, all other Review of Systems were negative.   With Past History of the following :    Past Medical History:  Diagnosis Date  . Anemia    "as a child"  . Anxiety   . Arthritis    "shoulders; back" (03/16/2015)  . Chronic back pain   . Chronic bronchitis ([place])    "I basically keep it" (03/16/2015)  . Chronic pain   . COPD (chronic obstructive pulmonary disease) ([place])   . DDD (degenerative disc disease), lumbar   . Degenerative disc disease   . Depression   . Family history of  adverse reaction to anesthesia    "my mother had an allergic reaction when she had kidney removed back in the '60's or '70's""  . Headache    "monthly; need glasses; comes on when I'm stressed or get too tired" (03/16/2015)  . Hepatitis C   . History of stomach ulcers   . Hypertension   . Kidney stone   . On home O2    "~ 3L once/wk and prn" (03/16/2015)  . Pain management   . Pneumonia    "@ least once/yr for the past 8 yrs" (03/16/2015)  . Spinal stenosis   . Tobacco use       Past Surgical History:  Procedure Laterality Date  . ANTERIOR CERVICAL DECOMP/DISCECTOMY FUSION  2002  . BACK SURGERY    . FEMUR IM NAIL Right 11/28/2012   Procedure:  INTRAMEDULLARY (IM) RETROGRADE FEMORAL NAILING wants jackson table , c-arm and biomet ;  Surgeon: Mauri Pole, MD;  Location: [facility];  Service: Orthopedics;  Laterality: Right;  . FRACTURE SURGERY    . INCONTINENCE SURGERY  1998  . TOTAL ABDOMINAL HYSTERECTOMY  1998      Social History:     Social History  Substance Use Topics  . Smoking status: Former Smoker    Packs/day: 0.25    Years: 36.00    Types: Cigarettes  . Smokeless tobacco: Never Used  . Alcohol use No     Comment: denies h/o alcohol abuse on 03/16/2015     Lives - at home  Mobility - walks by self   Family History :     Family History  Problem Relation Age of Onset  . Cancer Mother   . Emphysema Mother   . Heart failure Father   . Stroke Father       Home Medications:   Prior to Admission medications   Medication Sig Start Date End Date Taking? Authorizing Provider  albuterol (PROVENTIL HFA;VENTOLIN HFA) 108 (90 Base) MCG/ACT inhaler Inhale 2 puffs into the lungs every 6 (six) hours as needed for wheezing or shortness of breath. 01/25/17  Yes Jola Schmidt, MD  albuterol (PROVENTIL) (2.5 MG/3ML) 0.083% nebulizer solution Take 3 mLs (2.5 mg total) by nebulization every 6 (six) hours as needed for wheezing or shortness of breath (buy this only if you cannot  afford the Duoneb). 01/25/17  Yes Jola Schmidt, MD  amLODipine (NORVASC) 2.5 MG tablet Take 1 tablet (2.5 mg total) by mouth daily. 07/23/16  Yes Velvet Bathe, MD  DULoxetine (CYMBALTA) 60 MG capsule Take 60 mg by mouth 2 (two) times daily.   Yes [provider]  Fluticasone-Salmeterol (ADVAIR DISKUS) 250-50 MCG/DOSE AEPB Inhale 1 puff into the lungs 2 (two) times daily. 01/25/17  Yes Jola Schmidt, MD  gabapentin (NEURONTIN) 800 MG tablet Take 800 mg by mouth 4 (four) times daily.    Yes [provider]  ibuprofen (ADVIL,MOTRIN) 200 MG tablet Take 800 mg by mouth every 6 (six) hours as needed for moderate pain.    Yes [provider]  methadone (DOLOPHINE) 10 MG/ML solution Take 130 mg by mouth daily.    Yes [provider]  potassium chloride (K-DUR,KLOR-CON) 10 MEQ tablet Take 10 mEq by mouth daily.   Yes [provider]  tiotropium (SPIRIVA HANDIHALER) 18 MCG inhalation capsule Place 1 capsule (18 mcg total) into inhaler and inhale daily. 04/08/16  Yes Debbe Odea, MD  tiZANidine (ZANAFLEX) 4 MG tablet Take 4 mg by mouth 2 (two) times daily as needed for muscle spasms.  07/18/16  Yes [provider]     Allergies:     Allergies  Allergen Reactions  . Aspirin     Upset stomach     Physical Exam:   Vitals  Blood pressure (!) 144/97, pulse 95, temperature 98.9 F (37.2 C), temperature source Oral, resp. rate 15, height 5\' 6"  (1.676 m), weight 51.3 kg (113 lb), SpO2 97 %.   1. General lying in bed in NAD,    2. Normal affect and insight, Not Suicidal or Homicidal, Awake Alert, Oriented X 3.  3. No F.N deficits, ALL C.Nerves Intact, Strength 5/5 all 4 extremities, Sensation intact all 4 extremities, Plantars down going.  4. Ears and Eyes appear Normal, Conjunctivae clear, PERRLA. Moist Oral Mucosa.  5. Supple Neck, No JVD, No cervical lymphadenopathy appriciated, No Carotid  Bruits.  6. Symmetrical Chest wall movement, Good  air movement bilaterally, + wheezing, bilateral lung fields,  No crackles.   7. RRR, No Gallops, Rubs or Murmurs, No Parasternal Heave.  8. Positive Bowel Sounds, Abdomen Soft, No tenderness, No organomegaly appriciated,No rebound -guarding or rigidity.  9.  No Cyanosis, Normal Skin Turgor, No Skin Rash or Bruise.  10. Good muscle tone,  joints appear normal , no effusions, Normal ROM.  11. No Palpable Lymph Nodes in Neck or Axillae     Data Review:    CBC  Recent Labs Lab 07/08/17 0116  WBC 12.7*  HGB 11.6*  HCT 36.1  PLT 189  MCV 95.0  MCH 30.5  MCHC 32.1  RDW 14.1  LYMPHSABS 1.7  MONOABS 0.5  EOSABS 0.1  BASOSABS 0.0   ------------------------------------------------------------------------------------------------------------------  Chemistries   Recent Labs Lab 07/08/17 0116  NA 139  K 3.7  CL 110  CO2 21*  GLUCOSE 123*  BUN 11  CREATININE 1.20*  CALCIUM 8.8*  AST 21  ALT 14  ALKPHOS 109  BILITOT 0.5   ------------------------------------------------------------------------------------------------------------------ estimated creatinine clearance is 40.9 mL/min (A) (by C-G formula based on SCr of 1.2 mg/dL (H)). ------------------------------------------------------------------------------------------------------------------ No results for input(s): TSH, T4TOTAL, T3FREE, THYROIDAB in the last 72 hours.  Invalid input(s): FREET3  Coagulation profile No results for input(s): INR, PROTIME in the last 168 hours. -------------------------------------------------------------------------------------------------------------------  Recent Labs  07/08/17 0116  DDIMER 0.65*   -------------------------------------------------------------------------------------------------------------------  Cardiac Enzymes  Recent Labs Lab 07/08/17 0116  TROPONINI <0.03    ------------------------------------------------------------------------------------------------------------------    Component Value Date/Time   BNP 62.6 04/06/2016 0208     ---------------------------------------------------------------------------------------------------------------  Urinalysis    Component Value Date/Time   COLORURINE AMBER (A) 04/05/2016 2127   APPEARANCEUR CLOUDY (A) 04/05/2016 2127   LABSPEC 1.021 04/05/2016 2127   PHURINE 5.5 04/05/2016 2127   GLUCOSEU NEGATIVE 04/05/2016 2127   HGBUR TRACE (A) 04/05/2016 2127   BILIRUBINUR SMALL (A) 04/05/2016 2127   KETONESUR NEGATIVE 04/05/2016 2127   PROTEINUR 100 (A) 04/05/2016 2127   UROBILINOGEN 4.0 (H) 06/03/2012 1648   NITRITE NEGATIVE 04/05/2016 2127   LEUKOCYTESUR TRACE (A) 04/05/2016 2127    ----------------------------------------------------------------------------------------------------------------   Imaging Results:    Dg Chest 2 View  Result Date: 07/08/2017 CLINICAL DATA:  61 y/o  F; COPD exacerbation and productive cough. EXAM: CHEST  2 VIEW COMPARISON:  02/02/2017 chest radiograph FINDINGS: Severe emphysema with bullous changes of the right mid and upper lung zone. Increase hazy opacification of the lung bases and pulmonary markings. No pleural effusion or pneumothorax. Stable normal cardiac silhouette. Aortic atherosclerosis with calcification. IMPRESSION: Severe emphysema. Increased hazy opacification of lung bases and pulmonary markings may represent superimposed pneumonitis or pulmonary edema. Aortic atherosclerosis. Electronically Signed   By: Kristine Garbe M.D.   On: 07/08/2017 02:06   Ct Angio Chest Pe W And/or Wo Contrast  Result Date: 07/08/2017 CLINICAL DATA:  Productive cough, COPD exacerbation. EXAM: CT ANGIOGRAPHY CHEST WITH CONTRAST TECHNIQUE: Multidetector CT imaging of the chest was performed using the standard protocol during bolus administration of intravenous contrast.  Multiplanar CT image reconstructions and MIPs were obtained to evaluate the vascular anatomy. CONTRAST:  65 mL Isovue 370 IV. COMPARISON:  07/19/2016 and 05/07/2014 FINDINGS: Cardiovascular: Heart is within normal in size. Minimal calcified plaque over the left main and left anterior descending coronary artery as well as the right coronary artery. Mild calcified plaque over the thoracic aorta. Ectasia of the ascending thoracic aorta measuring 3.4 cm in  AP diameter unchanged. No evidence of pulmonary emboli. Mediastinum/Nodes: 1 cm left hilar lymph node likely reactive. No significant mediastinal adenopathy. Remaining mediastinal structures are unremarkable. Lungs/Pleura: Lungs are adequately inflated demonstrate severe emphysematous disease right worse than left. There is mild patchy areas of airspace density over the right middle lobe and right upper lobe are likely atelectasis/ scarring. Minimal lingular scarring unchanged. Subtle heterogeneous opacification over the left midlung which is new and may be due to acute infectious or inflammatory process. Stable 2-3 mm nodule over the left lower lobe. Bronchiectatic change over the right middle lobe. Upper Abdomen: Unremarkable. Musculoskeletal: Degenerative change of the spine. Review of the MIP images confirms the above findings. IMPRESSION: No evidence of pulmonary embolism. Severe emphysematous disease with patchy areas of atelectasis/ scarring unchanged. New subtle heterogeneous opacification over the left midlung which may be due to an acute infectious or inflammatory process. Very minimal atherosclerotic coronary artery disease. Mild ectasia of the ascending thoracic aorta unchanged measuring 3.4 cm. Recommend annual imaging followup by CTA or MRA. This recommendation follows 2010 ACCF/AHA/AATS/ACR/ASA/SCA/SCAI/SIR/STS/SVM Guidelines for the Diagnosis and Management of Patients with Thoracic Aortic Disease. Circulation.2010; 121: B749-T552. Aortic  Atherosclerosis (ICD10-I70.0) and Emphysema (ICD10-J43.9). Electronically Signed   By: Marin Olp M.D.   On: 07/08/2017 02:58     Assessment & Plan:    Principal Problem:   COPD exacerbation ([place]) Active Problems:   Substance abuse ([place])   HTN (hypertension)   Hyperglycemia   Anemia   Renal insufficiency    Copd exacerbation Solumedrol 80mg  iv q8h Levaquin  500mg  iv qday Albuterol neb 1 q6h and q6h prn spiriva 1puff qday  Substance abuse Continue methadone 130mg  po qday UDS   Renal insufficiency Hydrate with ns iv Check cmp  In am  Anemia Repeat cbc in am  Hperglycemia Check hga1c  Hypertension Continue current medications.    DVT Prophylaxis Lovenox - SCDs  AM Labs Ordered, also please review Full Orders  Family Communication: Admission, patients condition and plan of care including tests being ordered have been discussed with the patient  who indicate understanding and agree with the plan and Code Status.  Code Status FULL CODE  Likely DC to  home  Condition GUARDED   Consults called: none  Admission status: inpatient   Time spent in minutes : 45   Jani Gravel M.D on 07/08/2017 at 5:15 AM  Between 7am to 7pm - Pager - [phone]. After 7pm go to [email or web address] - password [email or web address]  [facility] - Office  [phone]

## 2017-07-08 NOTE — ED Notes (Signed)
Pt is requesting 2-cokes and a cup of ice. Pt is given the same and oral swabs with a cup of water for dry mouth.

## 2017-07-09 ENCOUNTER — Encounter (HOSPITAL_COMMUNITY): Payer: Self-pay | Admitting: Internal Medicine

## 2017-07-09 DIAGNOSIS — Z22322 Carrier or suspected carrier of Methicillin resistant Staphylococcus aureus: Secondary | ICD-10-CM

## 2017-07-09 DIAGNOSIS — R739 Hyperglycemia, unspecified: Secondary | ICD-10-CM

## 2017-07-09 HISTORY — DX: Carrier or suspected carrier of methicillin resistant Staphylococcus aureus: Z22.322

## 2017-07-09 LAB — CBC
HEMATOCRIT: 43.4 % (ref 36.0–46.0)
Hemoglobin: 14.4 g/dL (ref 12.0–15.0)
MCH: 30.7 pg (ref 26.0–34.0)
MCHC: 33.2 g/dL (ref 30.0–36.0)
MCV: 92.5 fL (ref 78.0–100.0)
PLATELETS: 256 10*3/uL (ref 150–400)
RBC: 4.69 MIL/uL (ref 3.87–5.11)
RDW: 13.8 % (ref 11.5–15.5)
WBC: 26.7 10*3/uL — ABNORMAL HIGH (ref 4.0–10.5)

## 2017-07-09 LAB — COMPREHENSIVE METABOLIC PANEL
ALBUMIN: 4 g/dL (ref 3.5–5.0)
ALT: 15 U/L (ref 14–54)
AST: 21 U/L (ref 15–41)
Alkaline Phosphatase: 156 U/L — ABNORMAL HIGH (ref 38–126)
Anion gap: 12 (ref 5–15)
BUN: 13 mg/dL (ref 6–20)
CHLORIDE: 99 mmol/L — AB (ref 101–111)
CO2: 22 mmol/L (ref 22–32)
CREATININE: 1.04 mg/dL — AB (ref 0.44–1.00)
Calcium: 9.6 mg/dL (ref 8.9–10.3)
GFR calc Af Amer: >60 mL/min (ref >60)
GFR calc non Af Amer: 58 mL/min — ABNORMAL LOW (ref >60)
GLUCOSE: 151 mg/dL — AB (ref 65–99)
POTASSIUM: 4.1 mmol/L (ref 3.5–5.1)
SODIUM: 133 mmol/L — AB (ref 135–145)
Total Bilirubin: 0.4 mg/dL (ref 0.3–1.2)
Total Protein: 7.8 g/dL (ref 6.5–8.1)

## 2017-07-09 MED ORDER — ALUM & MAG HYDROXIDE-SIMETH 200-200-20 MG/5ML PO SUSP
30.0000 mL | Freq: Four times a day (QID) | ORAL | Status: DC | PRN
  Administered 2017-07-09: 30 mL via ORAL
  Filled 2017-07-09: qty 30

## 2017-07-09 MED ORDER — METHYLPREDNISOLONE SODIUM SUCC 40 MG IJ SOLR
40.0000 mg | Freq: Three times a day (TID) | INTRAMUSCULAR | Status: DC
  Administered 2017-07-09 – 2017-07-10 (×3): 40 mg via INTRAVENOUS
  Filled 2017-07-09 (×3): qty 1

## 2017-07-09 MED ORDER — ENSURE ENLIVE PO LIQD
237.0000 mL | Freq: Three times a day (TID) | ORAL | Status: DC
  Administered 2017-07-09 – 2017-07-10 (×2): 237 mL via ORAL

## 2017-07-09 MED ORDER — ALBUTEROL SULFATE (2.5 MG/3ML) 0.083% IN NEBU
2.5000 mg | INHALATION_SOLUTION | Freq: Three times a day (TID) | RESPIRATORY_TRACT | Status: DC
  Administered 2017-07-10 – 2017-07-11 (×3): 2.5 mg via RESPIRATORY_TRACT
  Filled 2017-07-09 (×4): qty 3

## 2017-07-09 MED ORDER — CLONIDINE HCL 0.1 MG PO TABS
0.1000 mg | ORAL_TABLET | Freq: Two times a day (BID) | ORAL | Status: DC
  Administered 2017-07-09 (×2): 0.1 mg via ORAL
  Filled 2017-07-09 (×2): qty 1

## 2017-07-09 NOTE — Progress Notes (Signed)
Progress Note    Emily Moran  YTK:[id number] DOB: 1955/12/26  DOA: 07/08/2017 PCP: Nolene Ebbs, MD    Brief Narrative:   Chief complaint: Follow-up COPD exacerbation  Medical records reviewed and are as summarized below:  Emily Moran is an 61 y.o. female with a PMH of substance abuse, hepatitis C, and COPD who was admitted 07/08/17 for treatment of a COPD exacerbation.  Assessment/Plan:   Principal Problem:   COPD exacerbation ([facility]) Placed on Solu-Medrol 80 mg IV every 8 hours, Levaquin 500 milligrams IV daily, nebulized bronchodilators and Spiriva. Afebrile now. Wean Solu-Medrol to 40 mg IV every 8 hours.  Active Problems:   MRSA carrier Decontamination therapy ordered.    Ectatic aorta Patient advised that she will need follow-up imaging yearly for this.    Substance abuse ([place]) Continue methadone. HIV negative.    HTN (hypertension) Suboptimally controlled, despite Norvasc being decreased to 5 mg daily. Will add clonidine.    Hyperglycemia Possibly steroid-induced. Mild. No need to treat unless blood glucoses consistently elevated greater than 150.    Anemia Mild.    Renal insufficiency Baseline creatinine is 0.9. Current creatinine 1.04. Likely from dehydration. Creatinine improved with IV fluids.    Underweight Body mass index is 18.24 kg/m. Dietitian consultation for assessment of nutritional status.   Family Communication/Anticipated D/C date and plan/Code Status   DVT prophylaxis: Lovenox ordered. Code Status: Full Code.  Family Communication: No family at bedside. Disposition Plan: Possibly home 07/10/17 if respiratory status remained stable.   Medical Consultants:    None.   Anti-Infectives:    Levaquin 07/08/17--->  Subjective:   Still feels short of breath, but much improved. Still with poor appetite, but nausea better and thinks she may be able to eat a little something for lunch. Was able to keep her methadone down this  morning, says she threw it up yesterday.  Objective:    Vitals:   07/08/17 2032 07/08/17 2043 07/09/17 0505 07/09/17 0714  BP: (!) 138/102  (!) 188/109 108/67  Pulse: (!) 105  (!) 105 93  Resp: 20  20   Temp: 98.3 F (36.8 C)  97.9 F (36.6 C)   TempSrc: Oral  Oral   SpO2: 96% 99% 99%   Weight: 51.3 kg (113 lb)     Height:        Intake/Output Summary (Last 24 hours) at 07/09/17 0752 Last data filed at 07/09/17 0600  Gross per 24 hour  Intake              160 ml  Output             1500 ml  Net            -1340 ml   Filed Weights   07/08/17 0111 07/08/17 2032  Weight: 51.3 kg (113 lb) 51.3 kg (113 lb)    Exam: General: No acute distress. Affect brighter, more interactive, smiling. Cardiovascular: Heart sounds show a regular rate, and rhythm. No gallops or rubs. No murmurs. + JVD. Lungs: Course with expiratory wheezes and fair air movement.. Abdomen: Soft, nontender, nondistended with normal active bowel sounds. No masses. No hepatosplenomegaly. Skin: Warm and dry. No rashes or lesions. Extremities: No clubbing or cyanosis. No edema. Pedal pulses 2+. . Data Reviewed:   I have personally reviewed following labs and imaging studies:  Labs: Labs show the following:   Basic Metabolic Panel:  Recent Labs Lab 07/08/17 0116 07/09/17 0401  NA 139 133*  K 3.7 4.1  CL 110 99*  CO2 21* 22  GLUCOSE 123* 151*  BUN 11 13  CREATININE 1.20* 1.04*  CALCIUM 8.8* 9.6   GFR Estimated Creatinine Clearance: 47.2 mL/min (A) (by C-G formula based on SCr of 1.04 mg/dL (H)). Liver Function Tests:  Recent Labs Lab 07/08/17 0116 07/09/17 0401  AST 21 21  ALT 14 15  ALKPHOS 109 156*  BILITOT 0.5 0.4  PROT 7.0 7.8  ALBUMIN 3.8 4.0    Recent Labs Lab 07/08/17 0116  LIPASE 26   CBC:  Recent Labs Lab 07/08/17 0116 07/09/17 0401  WBC 12.7* 26.7*  NEUTROABS 10.4*  --   HGB 11.6* 14.4  HCT 36.1 43.4  MCV 95.0 92.5  PLT 189 256   Cardiac Enzymes:  Recent  Labs Lab 07/08/17 0116  TROPONINI <0.03   D-Dimer:  Recent Labs  07/08/17 0116  DDIMER 0.65*    Microbiology Recent Results (from the past 240 hour(s))  MRSA PCR Screening     Status: Abnormal   Collection Time: 07/08/17  6:27 PM  Result Value Ref Range Status   MRSA by PCR POSITIVE (A) NEGATIVE Final    Comment:        The GeneXpert MRSA Assay (FDA approved for NASAL specimens only), is one component of a comprehensive MRSA colonization surveillance program. It is not intended to diagnose MRSA infection nor to guide or monitor treatment for MRSA infections. RESULT CALLED TO, READ BACK BY AND VERIFIED WITHBarnabas Harries RN [id number] 07/08/17 A BROWNING     Procedures and diagnostic studies:  07/08/17: Dg Chest 2 View: My independent review of the image shows: Severe hyperexpansion of the lungs. Infiltrates the bilateral bases.     07/08/17: Ct Angio Chest Pe W And/or Wo Contrast:  No evidence of pulmonary embolism. Severe emphysematous disease with patchy areas of atelectasis/ scarring unchanged. New subtle heterogeneous opacification over the left midlung which may be due to an acute infectious or inflammatory process. Very minimal atherosclerotic coronary artery disease. Mild ectasia of the ascending thoracic aorta unchanged measuring 3.4 cm. Recommend annual imaging followup by CTA or MRA.   Medications:   . albuterol  2.5 mg Nebulization Q6H  . amLODipine  5 mg Oral Daily  . Chlorhexidine Gluconate Cloth  6 each Topical Q0600  . DULoxetine  60 mg Oral BID  . enoxaparin (LOVENOX) injection  30 mg Subcutaneous Q24H  . gabapentin  800 mg Oral QID  . methadone  130 mg Oral Q0600  . methylPREDNISolone (SOLU-MEDROL) injection  80 mg Intravenous Q8H  . mometasone-formoterol  2 puff Inhalation BID  . mupirocin ointment  1 application Nasal BID  . potassium chloride  10 mEq Oral Daily  . sodium chloride flush  3 mL Intravenous Q12H  . tiotropium  18 mcg Inhalation Daily    Continuous Infusions: . sodium chloride    . levofloxacin (LEVAQUIN) IV Stopped (07/09/17 0704)     LOS: 1 day   RAMA,CHRISTINA  [facility] Pager [phone]. If unable to reach me by pager, please call my cell phone at [phone].  *Please refer to [email or web address], password [email or web address] to get updated schedule on who will round on this patient, as hospitalists switch teams weekly. If 7PM-7AM, please contact night-coverage at [email or web address], password [email or web address] for any overnight needs.  07/09/2017, 7:52 AM

## 2017-07-09 NOTE — Progress Notes (Signed)
Initial Nutrition Assessment  DOCUMENTATION CODES:   Severe malnutrition in context of chronic illness, Underweight  INTERVENTION:   Ensure Enlive po TID, each supplement provides 350 kcal and 20 grams of protein  NUTRITION DIAGNOSIS:   Malnutrition (Severe) related to chronic illness (COPD) as evidenced by energy intake < 75% for > or equal to 1 month, severe depletion of body fat, severe depletion of muscle mass.  GOAL:   Patient will meet greater than or equal to 90% of their needs  MONITOR:   PO intake, Supplement acceptance, Labs, Weight trends, Skin, I & O's  REASON FOR ASSESSMENT:   Consult Assessment of nutrition requirement/status  ASSESSMENT:   Emily Moran is an 61 y.o. female with a PMH of substance abuse, hepatitis C, and COPD who was admitted 07/08/17 for treatment of a COPD exacerbation.  Pt admitted with COPD exacerbation.   Spoke with pt and sister-in-law at bedside. Pt reports poor appetite for approximately 1 year; PTA was consuming 1-2 meals per day (large meal of steak and potato or hamburger, small meal of sandwich). Pt reports beverages are mainly Coke. She reports she does not get hungry often and complains of early satiety- she consumed only a few bites of grits this morning.   Pt reports UBW around 120-125#. Pt estimates she started losing weight over the past 6 months. Per wt hx, pt has experienced a 10.3% wt loss over the past year, which while not significant, is concerning when coupled with underweight status and poor oral intake.   Nutrition-Focused physical exam completed. Findings are severe fat depletion, severe muscle depletion, and no edema.   Discussed importance of good meal completion to promote healing. Encouraged pt to consume small, frequent high protein meals at home (such as peanut butter and crackers, yogurt, pudding ice cream, sandwiches) to optimize intake. Pt amenable to consume Ensure supplements, but unable to consume at home  due to finances.   Medications reviewed and include methylprednisolone and KCl.   Labs reviewed: Na: 133. Toxicology screen positive for tetrahydrocannabinol.   Diet Order:  Diet regular Room service appropriate? Yes; Fluid consistency: Thin  Skin:  Reviewed, no issues  Last BM:  07/08/17  Height:   Ht Readings from Last 1 Encounters:  07/08/17 5\' 6"  (1.676 m)    Weight:   Wt Readings from Last 1 Encounters:  07/08/17 113 lb (51.3 kg)    Ideal Body Weight:  59.1 kg  BMI:  Body mass index is 18.24 kg/m.  Estimated Nutritional Needs:   Kcal:  1600-1800  Protein:  85-100 grams  Fluid:  1.6-1.8 L  EDUCATION NEEDS:   Education needs addressed  Advith Martine A. Jimmye Norman, RD, LDN, CDE Pager: [phone] After hours Pager: [phone]

## 2017-07-09 NOTE — Progress Notes (Signed)
CRITICAL VALUE ALERT  Critical Value:  (+) MRSA PCR  Date & Time Notied:  07/08/17 @22 :54  Provider Notified: Maryjane Hurter  Orders Received/Actions taken: ordered standing order for (+) mrsa pcr.

## 2017-07-10 DIAGNOSIS — E43 Unspecified severe protein-calorie malnutrition: Secondary | ICD-10-CM

## 2017-07-10 LAB — BASIC METABOLIC PANEL
ANION GAP: 11 (ref 5–15)
BUN: 38 mg/dL — ABNORMAL HIGH (ref 6–20)
CALCIUM: 9.2 mg/dL (ref 8.9–10.3)
CO2: 24 mmol/L (ref 22–32)
Chloride: 96 mmol/L — ABNORMAL LOW (ref 101–111)
Creatinine, Ser: 1.8 mg/dL — ABNORMAL HIGH (ref 0.44–1.00)
GFR, EST AFRICAN AMERICAN: 34 mL/min — AB (ref >60)
GFR, EST NON AFRICAN AMERICAN: 30 mL/min — AB (ref >60)
Glucose, Bld: 123 mg/dL — ABNORMAL HIGH (ref 65–99)
POTASSIUM: 4.6 mmol/L (ref 3.5–5.1)
SODIUM: 131 mmol/L — AB (ref 135–145)

## 2017-07-10 MED ORDER — LEVOFLOXACIN IN D5W 500 MG/100ML IV SOLN: 500.0000 mg | INTRAVENOUS | Status: DC

## 2017-07-10 MED ORDER — SODIUM CHLORIDE 0.9 % IV BOLUS (SEPSIS)
500.0000 mL | Freq: Once | INTRAVENOUS | Status: AC
  Administered 2017-07-10: 500 mL via INTRAVENOUS

## 2017-07-10 MED ORDER — METHYLPREDNISOLONE SODIUM SUCC 40 MG IJ SOLR
40.0000 mg | Freq: Every day | INTRAMUSCULAR | Status: DC
  Administered 2017-07-11: 40 mg via INTRAVENOUS
  Filled 2017-07-10: qty 1

## 2017-07-10 NOTE — Progress Notes (Addendum)
PROGRESS NOTE    Emily Moran  ERD:[id number] DOB: 05-07-1956 DOA: 07/08/2017 PCP: Nolene Ebbs, MD   Brief Narrative: Emily Moran is a 61 y.o. female with a PMH of substance abuse, hepatitis C, and COPD who was admitted 07/08/17 for treatment of a COPD exacerbation.   Assessment & Plan:   Principal Problem:   COPD exacerbation ([place]) Active Problems:   Substance abuse ([place])   HTN (hypertension)   Hyperglycemia   Anemia   Renal insufficiency   Ectatic aorta (HCC)   MRSA carrier   Protein-calorie malnutrition, severe   COPD exacerbation Improved but not at baseline. -Solu-Medrol 40 mg IV daily -Continue Levaquin -Continue bronchodilators and Spiriva  Chronic respiratory failure -continue O2  Acute kidney injury Initially improved but worsened overnight likely secondary to hypotension. -Give IV fluid bolus -Repeat BMP in the morning -Blood pressure control as below  Ectatic aorta Outpatient follow-up  Essential hypertension Initially hypertensive and now hypotensive after starting clonidine. -discontinue clonidine -continue amlodipine and will consider titrating up  MRSA carriar -continue bactroban  Hyperglycemia Likely secondary to steroids  Leukocytosis Secondary to steroids. Afebrile.  Underweight Protein-calorie malnutrition -dietitian recommendations  Substance abuse -continue methadone   DVT prophylaxis: Lovenox Code Status: Full code Family Communication: Niece at bedside Disposition Plan: Discharge in 24 hours if improves to baseline.   Consultants:   None  Procedures:   None  Antimicrobials:  Levaquin  Bactroban    Subjective: Patient reports improved breathing but not at baseline. Poor appetite.  Objective: Vitals:   07/09/17 2105 07/10/17 0505 07/10/17 0957 07/10/17 1124  BP:  93/79 107/66   Pulse:  69 94   Resp:  20 20   Temp:  98.5 F (36.9 C) 97.8 F (36.6 C)   TempSrc:  Oral Oral   SpO2: 98% 100%  100% 100%  Weight:      Height:        Intake/Output Summary (Last 24 hours) at 07/10/17 1140 Last data filed at 07/10/17 0530  Gross per 24 hour  Intake             1020 ml  Output              900 ml  Net              120 ml   Filed Weights   07/08/17 0111 07/08/17 2032 07/09/17 2103  Weight: 51.3 kg (113 lb) 51.3 kg (113 lb) 51.3 kg (113 lb)    Examination:  General exam: Appears calm and comfortable Respiratory system: Diffuse rhonchi bilaterally. Respiratory effort normal. Cardiovascular system: S1 & S2 heard, RRR. No murmurs. Gastrointestinal system: Abdomen is nondistended, soft and nontender. No organomegaly or masses felt. Normal bowel sounds heard. Central nervous system: Alert and oriented. No focal neurological deficits. Extremities: No edema. No calf tenderness Skin: No cyanosis. No rashes Psychiatry: Judgement and insight appear normal. Mood & affect appropriate.     Data Reviewed: I have personally reviewed following labs and imaging studies  CBC:  Recent Labs Lab 07/08/17 0116 07/09/17 0401  WBC 12.7* 26.7*  NEUTROABS 10.4*  --   HGB 11.6* 14.4  HCT 36.1 43.4  MCV 95.0 92.5  PLT 189 563   Basic Metabolic Panel:  Recent Labs Lab 07/08/17 0116 07/09/17 0401 07/10/17 0245  NA 139 133* 131*  K 3.7 4.1 4.6  CL 110 99* 96*  CO2 21* 22 24  GLUCOSE 123* 151* 123*  BUN 11 13 38*  CREATININE  1.20* 1.04* 1.80*  CALCIUM 8.8* 9.6 9.2   GFR: Estimated Creatinine Clearance: 27.3 mL/min (A) (by C-G formula based on SCr of 1.8 mg/dL (H)). Liver Function Tests:  Recent Labs Lab 07/08/17 0116 07/09/17 0401  AST 21 21  ALT 14 15  ALKPHOS 109 156*  BILITOT 0.5 0.4  PROT 7.0 7.8  ALBUMIN 3.8 4.0    Recent Labs Lab 07/08/17 0116  LIPASE 26   No results for input(s): AMMONIA in the last 168 hours. Coagulation Profile: No results for input(s): INR, PROTIME in the last 168 hours. Cardiac Enzymes:  Recent Labs Lab 07/08/17 0116  TROPONINI  <0.03   BNP (last 3 results) No results for input(s): PROBNP in the last 8760 hours. HbA1C: No results for input(s): HGBA1C in the last 72 hours. CBG: No results for input(s): GLUCAP in the last 168 hours. Lipid Profile: No results for input(s): CHOL, HDL, LDLCALC, TRIG, CHOLHDL, LDLDIRECT in the last 72 hours. Thyroid Function Tests: No results for input(s): TSH, T4TOTAL, FREET4, T3FREE, THYROIDAB in the last 72 hours. Anemia Panel: No results for input(s): VITAMINB12, FOLATE, FERRITIN, TIBC, IRON, RETICCTPCT in the last 72 hours. Sepsis Labs: No results for input(s): PROCALCITON, LATICACIDVEN in the last 168 hours.  Recent Results (from the past 240 hour(s))  MRSA PCR Screening     Status: Abnormal   Collection Time: 07/08/17  6:27 PM  Result Value Ref Range Status   MRSA by PCR POSITIVE (A) NEGATIVE Final    Comment:        The GeneXpert MRSA Assay (FDA approved for NASAL specimens only), is one component of a comprehensive MRSA colonization surveillance program. It is not intended to diagnose MRSA infection nor to guide or monitor treatment for MRSA infections. RESULT CALLED TO, READ BACK BY AND VERIFIED WITHBarnabas Harries RN 0254 07/08/17 A BROWNING          Radiology Studies: No results found.      Scheduled Meds: . albuterol  2.5 mg Nebulization TID  . amLODipine  5 mg Oral Daily  . Chlorhexidine Gluconate Cloth  6 each Topical Q0600  . DULoxetine  60 mg Oral BID  . enoxaparin (LOVENOX) injection  30 mg Subcutaneous Q24H  . feeding supplement (ENSURE ENLIVE)  237 mL Oral TID BM  . gabapentin  800 mg Oral QID  . methadone  130 mg Oral Q0600  . [START ON 07/11/2017] methylPREDNISolone (SOLU-MEDROL) injection  40 mg Intravenous Daily  . mometasone-formoterol  2 puff Inhalation BID  . mupirocin ointment  1 application Nasal BID  . potassium chloride  10 mEq Oral Daily  . sodium chloride flush  3 mL Intravenous Q12H  . tiotropium  18 mcg Inhalation Daily    Continuous Infusions: . sodium chloride    . levofloxacin (LEVAQUIN) IV Stopped (07/10/17 0645)  . sodium chloride       LOS: 2 days     Cordelia Poche, MD [facility] 07/10/2017, 11:40 AM Pager: [phone]  If 7PM-7AM, please contact night-coverage [email or web address] Password [email or web address] 07/10/2017, 11:40 AM

## 2017-07-10 NOTE — Progress Notes (Signed)
Hospice and [place] [facility]) Hospital Liaison:  RN visit  Notified by Carles Collet, [facility], of patient/family request for [facility] services at home after discharge. Chart and patient information under review by [facility] physician. Hospice eligibility pending at this time.  Writer spoke with patient at bedside to initiate education related to hospice philosophy, services and team approach to care.  Patient verbalized understanding of information given.  Per discussion, plan is for discharge to home by [place] on 07/11/2017.  Patient will need prescriptions for discharge comfort medications.  DME needs have been discussed, patient currently has the following equipment in the home:  oxygen.  No other DME urgent needs at this time.  [facility] aware of the above.  Completed discharge summary will need to be faxed to [facility] at [phone] when final.  Please notify [facility] when patient is ready to leave the unit at discharge. (Call [phone] or [phone] after 5pm.)  [facility] information and contact numbers given to patient during this visit.  Above information shared with Carles Collet, CMRN.  Please call with any hospice related questions.  Thank you for this referral.  Farrel Gordon, RN, [place] Hospital Liaison [phone]   All hospital liaisons are on [place].

## 2017-07-10 NOTE — Care Management Note (Addendum)
Case Management Note  Patient Details  Name: Emily Moran MRN: [id number] Date of Birth: September 05, 1956  Subjective/Objective:                 CM consult for home hospice care. Patient states she is active w home hospice care w [facility], however what she describes sounds more like home palliative care. Attempt to reach all 3 [facility] staff in [place], was able to only leave message w Trixie Dredge, awaiting callback. Patient has end stage COPD, states she was referred by her PCP, could not find old notes in [place]. She states she lives with a friend, and has O2 through [facility], also has nebulizer. Her sister will bring a portable tank and provide discharge to home via private car tomorrow 07/11/17.    Action/Plan:  Awaiting to hear if patient active w Home Hospice. Patient will need RW either through hospice or [facility] at DC.  13:45 Spoke w [place], patient is active for Palliative care only prior to admission, CM made referral for [facility] to take under [place].   Expected Discharge Date:  07/11/17               Expected Discharge Plan:  Home w Hospice Care  In-House Referral:     Discharge planning Services  CM Consult  Post Acute Care Choice:    Choice offered to:  Patient  DME Arranged:    DME Agency:     HH Arranged:    [place] Agency:     Status of Service:  In process, will continue to follow  If discussed at Long Length of Stay Meetings, dates discussed:    Additional Comments:  Carles Collet, RN 07/10/2017, 1:31 PM

## 2017-07-11 LAB — BASIC METABOLIC PANEL
Anion gap: 7 (ref 5–15)
BUN: 32 mg/dL — AB (ref 6–20)
CHLORIDE: 100 mmol/L — AB (ref 101–111)
CO2: 26 mmol/L (ref 22–32)
Calcium: 8.8 mg/dL — ABNORMAL LOW (ref 8.9–10.3)
Creatinine, Ser: 1.44 mg/dL — ABNORMAL HIGH (ref 0.44–1.00)
GFR calc Af Amer: 45 mL/min — ABNORMAL LOW (ref >60)
GFR calc non Af Amer: 39 mL/min — ABNORMAL LOW (ref >60)
Glucose, Bld: 88 mg/dL (ref 65–99)
POTASSIUM: 4.1 mmol/L (ref 3.5–5.1)
SODIUM: 133 mmol/L — AB (ref 135–145)

## 2017-07-11 MED ORDER — PREDNISONE 20 MG PO TABS: ORAL_TABLET | ORAL | 0 refills | Status: DC

## 2017-07-11 MED ORDER — IPRATROPIUM-ALBUTEROL 0.5-2.5 (3) MG/3ML IN SOLN: 3.0000 mL | RESPIRATORY_TRACT | Status: DC | PRN

## 2017-07-11 MED ORDER — ENSURE ENLIVE PO LIQD: 237.0000 mL | Freq: Three times a day (TID) | ORAL | 0 refills | Status: AC

## 2017-07-11 MED ORDER — AMLODIPINE BESYLATE 5 MG PO TABS: 5.0000 mg | ORAL_TABLET | Freq: Every day | ORAL | 0 refills | Status: DC

## 2017-07-11 NOTE — Discharge Summary (Signed)
Physician Discharge Summary  Emily Moran GYK:[id number] DOB: 22-Jun-1956 DOA: 07/08/2017  PCP: Nolene Ebbs, MD  Admit date: 07/08/2017 Discharge date: 07/11/2017  Admitted From: Home Disposition: Home  Recommendations for Outpatient Follow-up:  1. Follow up with PCP in 1 week 2. Follow up with home hospice 3. Please obtain BMP/CBC in one week 4. Please follow up on the following pending results: None  Home Health: Hospice Equipment/Devices: Oxygen  Discharge Condition: Guarded CODE STATUS: Full code Diet recommendation: Heart healthy   Brief/Interim Summary:  Admission HPI written by Jani Gravel, MD    HPI:   Emily Moran  is a 61 y.o. female, w substance abuse, Hepatitis C, Copd apparently presents c/o dyspnea since Friday.  Was worse yesterday and therefore presented to ED .  Pt notes slight cough,  Denies fever, chills, cp, pap, n/v, diarrhea, brbpr, black stool.   In ED,  CXR negative, Wbc 12.7, Hgb 11.6 , Bun/creat 11/1.2,  Trop <0.03,  Pt will be admitted for Copd exacerbation   Hospital course:   COPD exacerbation Treated with bronchodilators, spiriva, Levaquin and steroid burst. Patient improved slowly and was at baseline prior to discharge. Discharged with a prednisone taper. Completed Levaquin course.  Chronic respiratory failure Continued oxygen via nasal canula.  Acute kidney injury Initially improved but worsened overnight likely secondary to hypotension. -Give IV fluid bolus -Repeat BMP in the morning -Blood pressure control as below  Ectatic aorta Outpatient follow-up  Essential hypertension Initially hypertensive and then became hypotensive after starting clonidine (started inpatient). Continue amlodipine.  MRSA carriar Bactroban  Hyperglycemia Likely secondary to steroids. Transient.  Leukocytosis Secondary to steroids. Afebrile. Repeat CBC as an outpatient.  Underweight Protein-calorie malnutrition Dietitian  recommendations: protein supplementation  Substance abuse Continued methadone  Discharge Diagnoses:  Principal Problem:   COPD exacerbation ([place]) Active Problems:   Substance abuse ([place])   HTN (hypertension)   Hyperglycemia   Anemia   Renal insufficiency   Ectatic aorta (HCC)   MRSA carrier   Protein-calorie malnutrition, severe    Discharge Instructions  Discharge Instructions    Diet - low sodium heart healthy    Complete by:  As directed    Increase activity slowly    Complete by:  As directed      Allergies as of 07/11/2017      Reactions   Aspirin    Upset stomach      Medication List    TAKE these medications   albuterol (2.5 MG/3ML) 0.083% nebulizer solution Commonly known as:  PROVENTIL Take 3 mLs (2.5 mg total) by nebulization every 6 (six) hours as needed for wheezing or shortness of breath (buy this only if you cannot afford the Duoneb).   albuterol 108 (90 Base) MCG/ACT inhaler Commonly known as:  PROVENTIL HFA;VENTOLIN HFA Inhale 2 puffs into the lungs every 6 (six) hours as needed for wheezing or shortness of breath.   amLODipine 5 MG tablet Commonly known as:  NORVASC Take 1 tablet (5 mg total) by mouth daily. What changed:  medication strength  how much to take   DULoxetine 60 MG capsule Commonly known as:  CYMBALTA Take 60 mg by mouth 2 (two) times daily.   feeding supplement (ENSURE ENLIVE) Liqd Take 237 mLs by mouth 3 (three) times daily between meals.   Fluticasone-Salmeterol 250-50 MCG/DOSE Aepb Commonly known as:  ADVAIR DISKUS Inhale 1 puff into the lungs 2 (two) times daily.   gabapentin 800 MG tablet Commonly known as:  NEURONTIN Take 800  mg by mouth 4 (four) times daily.   ibuprofen 200 MG tablet Commonly known as:  ADVIL,MOTRIN Take 800 mg by mouth every 6 (six) hours as needed for moderate pain.   methadone 10 MG/ML solution Commonly known as:  DOLOPHINE Take 130 mg by mouth daily.   potassium chloride 10 MEQ  tablet Commonly known as:  K-DUR,KLOR-CON Take 10 mEq by mouth daily.   predniSONE 20 MG tablet Commonly known as:  DELTASONE Take 60mg  x 2 days, 40mg  x 2 days, 20mg  x 3 days   tiotropium 18 MCG inhalation capsule Commonly known as:  SPIRIVA HANDIHALER Place 1 capsule (18 mcg total) into inhaler and inhale daily.   tiZANidine 4 MG tablet Commonly known as:  ZANAFLEX Take 4 mg by mouth 2 (two) times daily as needed for muscle spasms.       Allergies  Allergen Reactions  . Aspirin     Upset stomach    Consultations:  Hospice/palliative care of [place]   Procedures/Studies: Dg Chest 2 View  Result Date: 07/08/2017 CLINICAL DATA:  61 y/o  F; COPD exacerbation and productive cough. EXAM: CHEST  2 VIEW COMPARISON:  02/02/2017 chest radiograph FINDINGS: Severe emphysema with bullous changes of the right mid and upper lung zone. Increase hazy opacification of the lung bases and pulmonary markings. No pleural effusion or pneumothorax. Stable normal cardiac silhouette. Aortic atherosclerosis with calcification. IMPRESSION: Severe emphysema. Increased hazy opacification of lung bases and pulmonary markings may represent superimposed pneumonitis or pulmonary edema. Aortic atherosclerosis. Electronically Signed   By: Kristine Garbe M.D.   On: 07/08/2017 02:06   Ct Angio Chest Pe W And/or Wo Contrast  Result Date: 07/08/2017 CLINICAL DATA:  Productive cough, COPD exacerbation. EXAM: CT ANGIOGRAPHY CHEST WITH CONTRAST TECHNIQUE: Multidetector CT imaging of the chest was performed using the standard protocol during bolus administration of intravenous contrast. Multiplanar CT image reconstructions and MIPs were obtained to evaluate the vascular anatomy. CONTRAST:  65 mL Isovue 370 IV. COMPARISON:  07/19/2016 and 05/07/2014 FINDINGS: Cardiovascular: Heart is within normal in size. Minimal calcified plaque over the left main and left anterior descending coronary artery as well as the  right coronary artery. Mild calcified plaque over the thoracic aorta. Ectasia of the ascending thoracic aorta measuring 3.4 cm in AP diameter unchanged. No evidence of pulmonary emboli. Mediastinum/Nodes: 1 cm left hilar lymph node likely reactive. No significant mediastinal adenopathy. Remaining mediastinal structures are unremarkable. Lungs/Pleura: Lungs are adequately inflated demonstrate severe emphysematous disease right worse than left. There is mild patchy areas of airspace density over the right middle lobe and right upper lobe are likely atelectasis/ scarring. Minimal lingular scarring unchanged. Subtle heterogeneous opacification over the left midlung which is new and may be due to acute infectious or inflammatory process. Stable 2-3 mm nodule over the left lower lobe. Bronchiectatic change over the right middle lobe. Upper Abdomen: Unremarkable. Musculoskeletal: Degenerative change of the spine. Review of the MIP images confirms the above findings. IMPRESSION: No evidence of pulmonary embolism. Severe emphysematous disease with patchy areas of atelectasis/ scarring unchanged. New subtle heterogeneous opacification over the left midlung which may be due to an acute infectious or inflammatory process. Very minimal atherosclerotic coronary artery disease. Mild ectasia of the ascending thoracic aorta unchanged measuring 3.4 cm. Recommend annual imaging followup by CTA or MRA. This recommendation follows 2010 ACCF/AHA/AATS/ACR/ASA/SCA/SCAI/SIR/STS/SVM Guidelines for the Diagnosis and Management of Patients with Thoracic Aortic Disease. Circulation.2010; 121: N361-W431. Aortic Atherosclerosis (ICD10-I70.0) and Emphysema (ICD10-J43.9). Electronically Signed   By:  Marin Olp M.D.   On: 07/08/2017 02:58      Subjective: No dyspnea today. Mild coughing which has improved.  Discharge Exam: Vitals:   07/11/17 0501 07/11/17 0802  BP: 118/71   Pulse: 77   Resp: 14   Temp: 97.8 F (36.6 C)   SpO2: 98%  94%   Vitals:   07/10/17 1806 07/10/17 2158 07/11/17 0501 07/11/17 0802  BP: 129/83 115/74 118/71   Pulse: 85 79 77   Resp: 20 16 14    Temp: 97.8 F (36.6 C) 97.6 F (36.4 C) 97.8 F (36.6 C)   TempSrc: Oral Oral Oral   SpO2: 100% 96% 98% 94%  Weight:      Height:        General: Pt is alert, awake, not in acute distress. Cachectic appearing. Cardiovascular: RRR, S1/S2 +, no rubs, no gallops Respiratory: CTA bilaterally, no wheezing, no rhonchi Abdominal: Soft, NT, ND, bowel sounds + Extremities: no edema, no cyanosis    The results of significant diagnostics from this hospitalization (including imaging, microbiology, ancillary and laboratory) are listed below for reference.     Microbiology: Recent Results (from the past 240 hour(s))  MRSA PCR Screening     Status: Abnormal   Collection Time: 07/08/17  6:27 PM  Result Value Ref Range Status   MRSA by PCR POSITIVE (A) NEGATIVE Final    Comment:        The GeneXpert MRSA Assay (FDA approved for NASAL specimens only), is one component of a comprehensive MRSA colonization surveillance program. It is not intended to diagnose MRSA infection nor to guide or monitor treatment for MRSA infections. RESULT CALLED TO, READ BACK BY AND VERIFIED WITHBarnabas Harries RN [id number] 07/08/17 A BROWNING      Labs: BNP (last 3 results) No results for input(s): BNP in the last 8760 hours. Basic Metabolic Panel:  Recent Labs Lab 07/08/17 0116 07/09/17 0401 07/10/17 0245 07/11/17 0231  NA 139 133* 131* 133*  K 3.7 4.1 4.6 4.1  CL 110 99* 96* 100*  CO2 21* 22 24 26   GLUCOSE 123* 151* 123* 88  BUN 11 13 38* 32*  CREATININE 1.20* 1.04* 1.80* 1.44*  CALCIUM 8.8* 9.6 9.2 8.8*   Liver Function Tests:  Recent Labs Lab 07/08/17 0116 07/09/17 0401  AST 21 21  ALT 14 15  ALKPHOS 109 156*  BILITOT 0.5 0.4  PROT 7.0 7.8  ALBUMIN 3.8 4.0    Recent Labs Lab 07/08/17 0116  LIPASE 26   No results for input(s): AMMONIA in the  last 168 hours. CBC:  Recent Labs Lab 07/08/17 0116 07/09/17 0401  WBC 12.7* 26.7*  NEUTROABS 10.4*  --   HGB 11.6* 14.4  HCT 36.1 43.4  MCV 95.0 92.5  PLT 189 256   Cardiac Enzymes:  Recent Labs Lab 07/08/17 0116  TROPONINI <0.03   BNP: Invalid input(s): POCBNP CBG: No results for input(s): GLUCAP in the last 168 hours. D-Dimer No results for input(s): DDIMER in the last 72 hours. Hgb A1c No results for input(s): HGBA1C in the last 72 hours. Lipid Profile No results for input(s): CHOL, HDL, LDLCALC, TRIG, CHOLHDL, LDLDIRECT in the last 72 hours. Thyroid function studies No results for input(s): TSH, T4TOTAL, T3FREE, THYROIDAB in the last 72 hours.  Invalid input(s): FREET3 Anemia work up No results for input(s): VITAMINB12, FOLATE, FERRITIN, TIBC, IRON, RETICCTPCT in the last 72 hours. Urinalysis    Component Value Date/Time   COLORURINE AMBER (A) 04/05/2016 2127  APPEARANCEUR CLOUDY (A) 04/05/2016 2127   LABSPEC 1.021 04/05/2016 2127   PHURINE 5.5 04/05/2016 2127   GLUCOSEU NEGATIVE 04/05/2016 2127   HGBUR TRACE (A) 04/05/2016 2127   BILIRUBINUR SMALL (A) 04/05/2016 2127   KETONESUR NEGATIVE 04/05/2016 2127   PROTEINUR 100 (A) 04/05/2016 2127   UROBILINOGEN 4.0 (H) 06/03/2012 1648   NITRITE NEGATIVE 04/05/2016 2127   LEUKOCYTESUR TRACE (A) 04/05/2016 2127   Sepsis Labs Invalid input(s): PROCALCITONIN,  WBC,  LACTICIDVEN Microbiology Recent Results (from the past 240 hour(s))  MRSA PCR Screening     Status: Abnormal   Collection Time: 07/08/17  6:27 PM  Result Value Ref Range Status   MRSA by PCR POSITIVE (A) NEGATIVE Final    Comment:        The GeneXpert MRSA Assay (FDA approved for NASAL specimens only), is one component of a comprehensive MRSA colonization surveillance program. It is not intended to diagnose MRSA infection nor to guide or monitor treatment for MRSA infections. RESULT CALLED TO, READ BACK BY AND VERIFIED WITHBarnabas Harries RN  0211 07/08/17 A BROWNING      Time coordinating discharge: Over 30 minutes  SIGNED:   Cordelia Poche, MD [facility] 07/11/2017, 1:21 PM Pager [phone]  If 7PM-7AM, please contact night-coverage [email or web address] Password [email or web address]

## 2017-07-11 NOTE — Discharge Instructions (Signed)
Chronic Obstructive Pulmonary Disease Exacerbation Chronic obstructive pulmonary disease (COPD) is a common lung condition in which airflow from the lungs is limited. COPD is a general term that can be used to describe many different lung problems that limit airflow, including chronic bronchitis and emphysema. COPD exacerbations are episodes when breathing symptoms become much worse and require extra treatment. Without treatment, COPD exacerbations can be life threatening, and frequent COPD exacerbations can cause further damage to your lungs. What are the causes?  Respiratory infections.  Exposure to smoke.  Exposure to air pollution, chemical fumes, or dust. Sometimes there is no apparent cause or trigger. What increases the risk?  Smoking cigarettes.  Older age.  Frequent prior COPD exacerbations. What are the signs or symptoms?  Increased coughing.  Increased thick spit (sputum) production.  Increased wheezing.  Increased shortness of breath.  Rapid breathing.  Chest tightness. How is this diagnosed? Your medical history, a physical exam, and tests will help your health care provider make a diagnosis. Tests may include:  A chest X-ray.  Basic lab tests.  Sputum testing.  An arterial blood gas test.  How is this treated? Depending on the severity of your COPD exacerbation, you may need to be admitted to a hospital for treatment. Some of the treatments commonly used to treat COPD exacerbations are:  Antibiotic medicines.  Bronchodilators. These are drugs that expand the air passages. They may be given with an inhaler or nebulizer. Spacer devices may be needed to help improve drug delivery.  Corticosteroid medicines.  Supplemental oxygen therapy.  Airway clearing techniques, such as noninvasive ventilation (NIV) and positive expiratory pressure (PEP). These provide respiratory support through a mask or other noninvasive device.  Follow these instructions at  home:  Do not smoke. Quitting smoking is very important to prevent COPD from getting worse and exacerbations from happening as often.  Avoid exposure to all substances that irritate the airway, especially to tobacco smoke.  If you were prescribed an antibiotic medicine, finish it all even if you start to feel better.  Take all medicines as directed by your health care provider.It is important to use correct technique with inhaled medicines.  Drink enough fluids to keep your urine clear or pale yellow (unless you have a medical condition that requires fluid restriction).  Use a cool mist vaporizer. This makes it easier to clear your chest when you cough.  If you have a home nebulizer and oxygen, continue to use them as directed.  Maintain all necessary vaccinations to prevent infections.  Exercise regularly.  Eat a healthy diet.  Keep all follow-up appointments as directed by your health care provider. Get help right away if:  You have worsening shortness of breath.  You have trouble talking.  You have severe chest pain.  You have blood in your sputum.  You have a fever.  You have weakness, vomit repeatedly, or faint.  You feel confused.  You continue to get worse. This information is not intended to replace advice given to you by your health care provider. Make sure you discuss any questions you have with your health care provider. Document Released: 07/22/2007 Document Revised: 03/01/2016 Document Reviewed: 05/29/2013 Elsevier Interactive Patient Education  2017 [employer] Inc.  

## 2017-07-24 ENCOUNTER — Other Ambulatory Visit: Payer: Medicare Other

## 2018-05-29 ENCOUNTER — Emergency Department (HOSPITAL_COMMUNITY): Payer: Medicare Other

## 2018-05-29 ENCOUNTER — Other Ambulatory Visit: Payer: Self-pay

## 2018-05-29 ENCOUNTER — Encounter (HOSPITAL_COMMUNITY): Payer: Self-pay

## 2018-05-29 ENCOUNTER — Emergency Department (HOSPITAL_COMMUNITY)
Admission: EM | Admit: 2018-05-29 | Discharge: 2018-05-29 | Disposition: A | Discharge status: Home / Self Care | Payer: Medicare Other | Attending: Emergency Medicine | Admitting: Emergency Medicine

## 2018-05-29 DIAGNOSIS — I10 Essential (primary) hypertension: Secondary | ICD-10-CM | POA: Diagnosis not present

## 2018-05-29 DIAGNOSIS — Z87891 Personal history of nicotine dependence: Secondary | ICD-10-CM | POA: Diagnosis not present

## 2018-05-29 DIAGNOSIS — R0602 Shortness of breath: Secondary | ICD-10-CM | POA: Diagnosis present

## 2018-05-29 DIAGNOSIS — Z79899 Other long term (current) drug therapy: Secondary | ICD-10-CM | POA: Insufficient documentation

## 2018-05-29 DIAGNOSIS — J441 Chronic obstructive pulmonary disease with (acute) exacerbation: Secondary | ICD-10-CM | POA: Diagnosis not present

## 2018-05-29 LAB — BASIC METABOLIC PANEL
Anion gap: 9 (ref 5–15)
BUN: 9 mg/dL (ref 6–20)
CALCIUM: 9 mg/dL (ref 8.9–10.3)
CO2: 26 mmol/L (ref 22–32)
CREATININE: 0.91 mg/dL (ref 0.44–1.00)
Chloride: 101 mmol/L (ref 98–111)
GFR calc Af Amer: >60 mL/min (ref >60)
GFR calc non Af Amer: >60 mL/min (ref >60)
GLUCOSE: 158 mg/dL — AB (ref 70–99)
Potassium: 4 mmol/L (ref 3.5–5.1)
Sodium: 136 mmol/L (ref 135–145)

## 2018-05-29 LAB — CBC
HCT: 38.5 % (ref 36.0–46.0)
Hemoglobin: 12.5 g/dL (ref 12.0–15.0)
MCH: 31.3 pg (ref 26.0–34.0)
MCHC: 32.5 g/dL (ref 30.0–36.0)
MCV: 96.5 fL (ref 78.0–100.0)
PLATELETS: 267 10*3/uL (ref 150–400)
RBC: 3.99 MIL/uL (ref 3.87–5.11)
RDW: 13.2 % (ref 11.5–15.5)
WBC: 11.4 10*3/uL — ABNORMAL HIGH (ref 4.0–10.5)

## 2018-05-29 MED ORDER — PREDNISONE 20 MG PO TABS: 40.0000 mg | ORAL_TABLET | Freq: Every day | ORAL | 0 refills | Status: AC

## 2018-05-29 MED ORDER — ALBUTEROL SULFATE (2.5 MG/3ML) 0.083% IN NEBU: 2.5000 mg | INHALATION_SOLUTION | Freq: Four times a day (QID) | RESPIRATORY_TRACT | 12 refills | Status: AC | PRN

## 2018-05-29 MED ORDER — ALBUTEROL SULFATE (2.5 MG/3ML) 0.083% IN NEBU
5.0000 mg | INHALATION_SOLUTION | Freq: Once | RESPIRATORY_TRACT | Status: AC
  Administered 2018-05-29: 5 mg via RESPIRATORY_TRACT
  Filled 2018-05-29: qty 6

## 2018-05-29 MED ORDER — DOXYCYCLINE HYCLATE 100 MG PO TABS
100.0000 mg | ORAL_TABLET | Freq: Once | ORAL | Status: AC
  Administered 2018-05-29: 100 mg via ORAL
  Filled 2018-05-29: qty 1

## 2018-05-29 MED ORDER — DOXYCYCLINE HYCLATE 100 MG PO CAPS: 100.0000 mg | ORAL_CAPSULE | Freq: Two times a day (BID) | ORAL | 0 refills | Status: DC

## 2018-05-29 MED ORDER — PREDNISONE 20 MG PO TABS
60.0000 mg | ORAL_TABLET | Freq: Once | ORAL | Status: AC
  Administered 2018-05-29: 60 mg via ORAL
  Filled 2018-05-29: qty 3

## 2018-05-29 MED ORDER — ALBUTEROL SULFATE HFA 108 (90 BASE) MCG/ACT IN AERS: 1.0000 | INHALATION_SPRAY | Freq: Four times a day (QID) | RESPIRATORY_TRACT | 0 refills | Status: DC | PRN

## 2018-05-29 NOTE — ED Provider Notes (Signed)
[facility] DEPT Provider Note   CSN: [id number] Arrival date & time: 05/29/18  1130     History   Chief Complaint Chief Complaint  Patient presents with  . Shortness of Breath    HPI Emily Moran is a 62 y.o. female.  HPI 61 year old female presents to the emergency department from assisted living center with increasing cough and shortness of breath over the past 48 hours.  She has a history of COPD.  Oxygen dependent on 3 L.  Increasing productive cough.  Brought to the emergency department via EMS.  No documented fevers.  Symptoms are mild to moderate in severity.  No unilateral leg swelling.  No history of DVT or pulmonary embolism.  No chest pain at this time.   Past Medical History:  Diagnosis Date  . Anemia    "as a child"  . Anxiety   . Arthritis    "shoulders; back" (03/16/2015)  . Chronic back pain   . Chronic bronchitis ([place])    "I basically keep it" (03/16/2015)  . Chronic pain   . COPD (chronic obstructive pulmonary disease) ([place])   . DDD (degenerative disc disease), lumbar   . Degenerative disc disease   . Depression   . Ectatic aorta ([place]) 07/08/2017  . Family history of adverse reaction to anesthesia    "my mother had an allergic reaction when she had kidney removed back in the '60's or '70's""  . Headache    "monthly; need glasses; comes on when I'm stressed or get too tired" (03/16/2015)  . Hepatitis C   . History of stomach ulcers   . Hypertension   . Kidney stone   . MRSA carrier 07/09/2017  . On home O2    "~ 3L once/wk and prn" (03/16/2015)  . Pain management   . Pneumonia    "@ least once/yr for the past 8 yrs" (03/16/2015)  . Spinal stenosis   . Tobacco use     Patient Active Problem List   Diagnosis Date Noted  . Protein-calorie malnutrition, severe 07/10/2017  . MRSA carrier 07/09/2017  . Anemia 07/08/2017  . Renal insufficiency 07/08/2017  . Ectatic aorta ([place]) 07/08/2017  . COPD exacerbation ([place])  07/19/2016  . Lung nodule 07/19/2016  . Septic shock ([place]) 04/05/2016  . HCAP (healthcare-associated pneumonia) 04/05/2016  . CAP (community acquired pneumonia) 03/07/2016  . Acute respiratory failure with hypoxia ([place]) 03/16/2015  . COPD (chronic obstructive pulmonary disease) ([place]) 03/16/2015  . HTN (hypertension) 03/16/2015  . Leukocytosis 03/16/2015  . Hyperglycemia 03/16/2015  . Chronic pain syndrome 03/16/2015  . Hepatitis C 12/18/2007  . TOBACCO ABUSE 09/11/2007  . Substance abuse ([place]) 09/11/2007    Past Surgical History:  Procedure Laterality Date  . ANTERIOR CERVICAL DECOMP/DISCECTOMY FUSION  2002  . BACK SURGERY    . FEMUR IM NAIL Right 11/28/2012   Procedure: INTRAMEDULLARY (IM) RETROGRADE FEMORAL NAILING wants jackson table , c-arm and biomet ;  Surgeon: Mauri Pole, MD;  Location: [facility] ORS;  Service: Orthopedics;  Laterality: Right;  . FRACTURE SURGERY    . INCONTINENCE SURGERY  1998  . TOTAL ABDOMINAL HYSTERECTOMY  1998     OB History   None      Home Medications    Prior to Admission medications   Medication Sig Start Date End Date Taking? Authorizing Provider  amLODipine (NORVASC) 5 MG tablet Take 1 tablet (5 mg total) by mouth daily. 07/12/17  Yes Mariel Aloe, MD  DULoxetine ([place])  60 MG capsule Take 60 mg by mouth 2 (two) times daily.   Yes [provider]  gabapentin (NEURONTIN) 800 MG tablet Take 800 mg by mouth 4 (four) times daily.    Yes [provider]  ibuprofen (ADVIL,MOTRIN) 200 MG tablet Take 800 mg by mouth every 6 (six) hours as needed for moderate pain.    Yes [provider]  LORazepam (ATIVAN) 1 MG tablet Take 1 mg by mouth 3 (three) times daily.  05/19/18  Yes [provider]  methadone (DOLOPHINE) 10 MG/ML solution Take 40 mg by mouth daily.    Yes [provider]  potassium chloride (K-DUR,KLOR-CON) 10 MEQ tablet Take 10 mEq by mouth daily.   Yes [provider]  tiZANidine  (ZANAFLEX) 4 MG tablet Take 4 mg by mouth 2 (two) times daily as needed for muscle spasms.  07/18/16  Yes [provider]  albuterol (PROVENTIL HFA;VENTOLIN HFA) 108 (90 Base) MCG/ACT inhaler Inhale 1-2 puffs into the lungs every 6 (six) hours as needed for wheezing or shortness of breath. 05/29/18   Jola Schmidt, MD  albuterol (PROVENTIL) (2.5 MG/3ML) 0.083% nebulizer solution Take 3 mLs (2.5 mg total) by nebulization every 6 (six) hours as needed for wheezing or shortness of breath (buy this only if you cannot afford the Duoneb). 05/29/18   Jola Schmidt, MD  doxycycline (VIBRAMYCIN) 100 MG capsule Take 1 capsule (100 mg total) by mouth 2 (two) times daily. 05/29/18   Jola Schmidt, MD  feeding supplement, ENSURE ENLIVE, (ENSURE ENLIVE) LIQD Take 237 mLs by mouth 3 (three) times daily between meals. Patient not taking: Reported on 05/29/2018 07/11/17   Mariel Aloe, MD  Fluticasone-Salmeterol (ADVAIR DISKUS) 250-50 MCG/DOSE AEPB Inhale 1 puff into the lungs 2 (two) times daily. Patient not taking: Reported on 05/29/2018 01/25/17   Jola Schmidt, MD  predniSONE (DELTASONE) 20 MG tablet Take 2 tablets (40 mg total) by mouth daily for 5 days. 05/29/18 06/03/18  Jola Schmidt, MD  tiotropium (SPIRIVA HANDIHALER) 18 MCG inhalation capsule Place 1 capsule (18 mcg total) into inhaler and inhale daily. Patient not taking: Reported on 05/29/2018 04/08/16   Debbe Odea, MD    Family History Family History  Problem Relation Age of Onset  . Cancer Mother   . Emphysema Mother   . Heart failure Father   . Stroke Father     Social History Social History   Tobacco Use  . Smoking status: Former Smoker    Packs/day: 0.25    Years: 36.00    Pack years: 9.00    Types: Cigarettes  . Smokeless tobacco: Never Used  Substance Use Topics  . Alcohol use: No    Comment: denies h/o alcohol abuse on 03/16/2015  . Drug use: Yes    Types: Marijuana    Comment: 03/17/2015 History of narcotic abuse, "currently  with [facility] on Methadone; I've been clean for almost 2 yrs now"     Allergies   Aspirin   Review of Systems Review of Systems  All other systems reviewed and are negative.    Physical Exam Updated Vital Signs BP 119/81   Pulse 84   Temp 97.7 F (36.5 C) (Oral)   Resp 12   SpO2 92%   Physical Exam  Constitutional: She is oriented to person, place, and time. She appears well-developed and well-nourished. No distress.  HENT:  Head: Normocephalic and atraumatic.  Eyes: EOM are normal.  Neck: Normal range of motion.  Cardiovascular: Normal  rate and regular rhythm.  Pulmonary/Chest: Effort normal.  No tachypnea.  No accessory muscle use.  Wheezing or rhonchi bilaterally.  Abdominal: Soft. She exhibits no distension. There is no tenderness.  Musculoskeletal: Normal range of motion. She exhibits no edema or tenderness.  Neurological: She is alert and oriented to person, place, and time.  Skin: Skin is warm and dry.  Psychiatric: She has a normal mood and affect. Judgment normal.  Nursing note and vitals reviewed.    ED Treatments / Results  Labs (all labs ordered are listed, but only abnormal results are displayed) Labs Reviewed  BASIC METABOLIC PANEL - Abnormal; Notable for the following components:      Result Value   Glucose, Bld 158 (*)    All other components within normal limits  CBC - Abnormal; Notable for the following components:   WBC 11.4 (*)    All other components within normal limits    EKG EKG Interpretation  Date/Time:  Thursday May 29 2018 12:01:31 EDT Ventricular Rate:  81 PR Interval:    QRS Duration: 99 QT Interval:  379 QTC Calculation: 440 R Axis:   82 Text Interpretation:  Sinus rhythm Borderline right axis deviation No significant change was found Confirmed by Jola Schmidt [id number]) on 05/29/2018 1:16:33 PM   Radiology Dg Chest 2 View  Result Date: 05/29/2018 CLINICAL DATA:  Worsening shortness of breath for 1  week. EXAM: CHEST - 2 VIEW COMPARISON:  07/08/2017 FINDINGS: The lungs are hyperinflated likely secondary to COPD. There is a large right upper lobe bulla encompassing nearly half right lung. There is no focal consolidation. There is no pleural effusion or pneumothorax. The heart and mediastinal contours are unremarkable. The osseous structures are unremarkable. IMPRESSION: No active cardiopulmonary disease. COPD with a large right upper lobe bulla. Electronically Signed   By: Kathreen Devoid   On: 05/29/2018 12:07    Procedures Procedures (including critical care time)  Medications Ordered in ED Medications  predniSONE (DELTASONE) tablet 60 mg (has no administration in time range)  doxycycline (VIBRA-TABS) tablet 100 mg (has no administration in time range)  albuterol (PROVENTIL) (2.5 MG/3ML) 0.083% nebulizer solution 5 mg (5 mg Nebulization Given 05/29/18 1200)     Initial Impression / Assessment and Plan / ED Course  I have reviewed the triage vital signs and the nursing notes.  Pertinent labs & imaging results that were available during my care of the patient were reviewed by me and considered in my medical decision making (see chart for details).     COPD exacerbation.  Feels better after bronchodilators here in the emergency department.  Home with bronchodilators and steroids.  Given her history of COPD and rhonchi on examination we will start the patient on doxycycline.  She is encouraged to return to the emergency department for new or worsening symptoms  Final Clinical Impressions(s) / ED Diagnoses   Final diagnoses:  COPD exacerbation [facility])    ED Discharge Orders         Ordered    predniSONE (DELTASONE) 20 MG tablet  Daily     05/29/18 1353    doxycycline (VIBRAMYCIN) 100 MG capsule  2 times daily     05/29/18 1353    albuterol (PROVENTIL HFA;VENTOLIN HFA) 108 (90 Base) MCG/ACT inhaler  Every 6 hours PRN     05/29/18 1353    albuterol (PROVENTIL) (2.5 MG/3ML) 0.083%  nebulizer solution  Every 6 hours PRN     05/29/18 1353  Jola Schmidt, MD 05/29/18 1430

## 2018-05-29 NOTE — ED Notes (Signed)
Bed: OF12 Expected date:  Expected time:  Means of arrival:  Comments: EMS-COPD

## 2018-05-29 NOTE — ED Notes (Signed)
[facility] called for transport back to residence

## 2018-05-29 NOTE — ED Notes (Signed)
Patient transported via [place].

## 2018-05-29 NOTE — ED Triage Notes (Signed)
Patient BIB EMS from [facility] with complaints of SOB and COPD exacerbation. Patient reports she previously was on hospice for her COPD, but was removed from hospice care recently. Patient reports SOB since yesterday approx. 0500. Patient reports increase in productive cough. Expiratory rhonchi noted by EMS- albuterol neb administered INH by EMS. Patient has home inhaler, but reports not using it today. Patient lives on [address] at home. 20G Left FA PIV placed by EMS.  EMS VS: HR90, RR22, BP 120/67, 95% on 4LNC.

## 2018-12-03 ENCOUNTER — Emergency Department (HOSPITAL_COMMUNITY)
Admission: EM | Admit: 2018-12-03 | Discharge: 2018-12-03 | Discharge status: Against Medical Advice | Payer: Medicare Other

## 2018-12-05 ENCOUNTER — Emergency Department (HOSPITAL_COMMUNITY): Payer: Medicare Other

## 2018-12-05 ENCOUNTER — Emergency Department (HOSPITAL_COMMUNITY)
Admission: EM | Admit: 2018-12-05 | Discharge: 2018-12-05 | Disposition: A | Discharge status: Skilled Nursing Facility | Payer: Medicare Other | Attending: Emergency Medicine | Admitting: Emergency Medicine

## 2018-12-05 ENCOUNTER — Encounter (HOSPITAL_COMMUNITY): Payer: Self-pay

## 2018-12-05 DIAGNOSIS — Z79899 Other long term (current) drug therapy: Secondary | ICD-10-CM | POA: Diagnosis not present

## 2018-12-05 DIAGNOSIS — R0602 Shortness of breath: Secondary | ICD-10-CM | POA: Diagnosis present

## 2018-12-05 DIAGNOSIS — I10 Essential (primary) hypertension: Secondary | ICD-10-CM | POA: Diagnosis not present

## 2018-12-05 DIAGNOSIS — Z87891 Personal history of nicotine dependence: Secondary | ICD-10-CM | POA: Insufficient documentation

## 2018-12-05 DIAGNOSIS — J441 Chronic obstructive pulmonary disease with (acute) exacerbation: Secondary | ICD-10-CM

## 2018-12-05 LAB — CBC WITH DIFFERENTIAL/PLATELET
Abs Immature Granulocytes: 0.09 10*3/uL — ABNORMAL HIGH (ref 0.00–0.07)
Basophils Absolute: 0.1 10*3/uL (ref 0.0–0.1)
Basophils Relative: 0 %
Eosinophils Absolute: 0 10*3/uL (ref 0.0–0.5)
Eosinophils Relative: 0 %
HEMATOCRIT: 45.7 % (ref 36.0–46.0)
Hemoglobin: 13.7 g/dL (ref 12.0–15.0)
Immature Granulocytes: 1 %
Lymphocytes Relative: 13 %
Lymphs Abs: 1.5 10*3/uL (ref 0.7–4.0)
MCH: 30.8 pg (ref 26.0–34.0)
MCHC: 30 g/dL (ref 30.0–36.0)
MCV: 102.7 fL — ABNORMAL HIGH (ref 80.0–100.0)
Monocytes Absolute: 0.4 10*3/uL (ref 0.1–1.0)
Monocytes Relative: 4 %
Neutro Abs: 9.3 10*3/uL — ABNORMAL HIGH (ref 1.7–7.7)
Neutrophils Relative %: 82 %
Platelets: 217 10*3/uL (ref 150–400)
RBC: 4.45 MIL/uL (ref 3.87–5.11)
RDW: 13.1 % (ref 11.5–15.5)
WBC: 11.4 10*3/uL — ABNORMAL HIGH (ref 4.0–10.5)
nRBC: 0 % (ref 0.0–0.2)

## 2018-12-05 LAB — INFLUENZA PANEL BY PCR (TYPE A & B)
Influenza A By PCR: NEGATIVE
Influenza B By PCR: NEGATIVE

## 2018-12-05 LAB — COMPREHENSIVE METABOLIC PANEL
ALT: 10 U/L (ref 0–44)
AST: 17 U/L (ref 15–41)
Albumin: 4.3 g/dL (ref 3.5–5.0)
Alkaline Phosphatase: 98 U/L (ref 38–126)
Anion gap: 5 (ref 5–15)
BILIRUBIN TOTAL: 0.5 mg/dL (ref 0.3–1.2)
BUN: 8 mg/dL (ref 6–20)
CALCIUM: 9.3 mg/dL (ref 8.9–10.3)
CO2: 29 mmol/L (ref 22–32)
Chloride: 103 mmol/L (ref 98–111)
Creatinine, Ser: 0.88 mg/dL (ref 0.44–1.00)
GFR calc Af Amer: >60 mL/min (ref >60)
GFR calc non Af Amer: >60 mL/min (ref >60)
Glucose, Bld: 98 mg/dL (ref 70–99)
Potassium: 4.3 mmol/L (ref 3.5–5.1)
Sodium: 137 mmol/L (ref 135–145)
TOTAL PROTEIN: 8 g/dL (ref 6.5–8.1)

## 2018-12-05 LAB — LACTIC ACID, PLASMA: Lactic Acid, Venous: 0.6 mmol/L (ref 0.5–1.9)

## 2018-12-05 LAB — I-STAT TROPONIN, ED: TROPONIN I, POC: 0 ng/mL (ref 0.00–0.08)

## 2018-12-05 MED ORDER — ALBUTEROL SULFATE HFA 108 (90 BASE) MCG/ACT IN AERS
1.0000 | INHALATION_SPRAY | Freq: Once | RESPIRATORY_TRACT | Status: AC
  Administered 2018-12-05: 1 via RESPIRATORY_TRACT
  Filled 2018-12-05: qty 6.7

## 2018-12-05 MED ORDER — ALBUTEROL SULFATE (2.5 MG/3ML) 0.083% IN NEBU
5.0000 mg | INHALATION_SOLUTION | Freq: Once | RESPIRATORY_TRACT | Status: AC
  Administered 2018-12-05: 5 mg via RESPIRATORY_TRACT
  Filled 2018-12-05: qty 6

## 2018-12-05 MED ORDER — GABAPENTIN 400 MG PO CAPS
800.0000 mg | ORAL_CAPSULE | Freq: Four times a day (QID) | ORAL | Status: DC
  Administered 2018-12-05 (×2): 800 mg via ORAL
  Filled 2018-12-05 (×3): qty 2

## 2018-12-05 MED ORDER — LORAZEPAM 1 MG PO TABS
1.0000 mg | ORAL_TABLET | Freq: Three times a day (TID) | ORAL | Status: DC
  Administered 2018-12-05: 1 mg via ORAL
  Filled 2018-12-05: qty 1

## 2018-12-05 MED ORDER — IPRATROPIUM BROMIDE 0.02 % IN SOLN
0.5000 mg | Freq: Once | RESPIRATORY_TRACT | Status: AC
  Administered 2018-12-05: 0.5 mg via RESPIRATORY_TRACT
  Filled 2018-12-05: qty 2.5

## 2018-12-05 MED ORDER — PREDNISONE 20 MG PO TABS
60.0000 mg | ORAL_TABLET | Freq: Once | ORAL | Status: AC
  Administered 2018-12-05: 60 mg via ORAL
  Filled 2018-12-05: qty 3

## 2018-12-05 MED ORDER — ALBUTEROL SULFATE HFA 108 (90 BASE) MCG/ACT IN AERS: 1.0000 | INHALATION_SPRAY | Freq: Four times a day (QID) | RESPIRATORY_TRACT | 0 refills | Status: DC | PRN

## 2018-12-05 MED ORDER — DOXYCYCLINE HYCLATE 100 MG PO CAPS: 100.0000 mg | ORAL_CAPSULE | Freq: Two times a day (BID) | ORAL | 0 refills | Status: DC

## 2018-12-05 MED ORDER — SODIUM CHLORIDE 0.9 % IV BOLUS
500.0000 mL | Freq: Once | INTRAVENOUS | Status: AC
  Administered 2018-12-05: 500 mL via INTRAVENOUS

## 2018-12-05 MED ORDER — DOXYCYCLINE HYCLATE 100 MG PO CAPS: 100.0000 mg | ORAL_CAPSULE | Freq: Two times a day (BID) | ORAL | 0 refills | Status: AC

## 2018-12-05 NOTE — ED Notes (Signed)
Pt ambulated to door and back x2 pulse oximetry 90

## 2018-12-05 NOTE — ED Notes (Signed)
Pt given medications and coke. Resting in bed with breathing treatment.

## 2018-12-05 NOTE — ED Notes (Signed)
Patient given warm blanket. Patient is able to speak in complete sentences at this time. Patient does not think she needs a breathing treatment at this time. Call bed placed at bedside.

## 2018-12-05 NOTE — ED Triage Notes (Addendum)
Patient arrived via [facility] from [facility].  Patient c/o shob x3 days.  Patient c/o productive cough.. None noted with ems.   Possible pneumonia.  Patient requested DG yesterday at her facility and they took one but have not given patient the results yet.  Patient was having shob today and could not wait any longer.   Patient always on 3L. When EMS arrived patient was hooked up to her own o2 but none was left in the bottle.   Patient ambulatory at facility with ems  Rhonchi both lower lobes per ems.   Last weekend patient stayed with her daughter and forgot her neb treatments and also her grand daughter was sick when she was there all weekend.  Patient did not receive flu shot.  Patient also smokes cigarettes.  95% 3L-02 88% RA

## 2018-12-05 NOTE — ED Provider Notes (Signed)
[facility] DEPT Provider Note   CSN: [id number] Arrival date & time: 12/05/18  1152  History   Chief Complaint Chief Complaint  Patient presents with  . Shortness of Breath    HPI Emily Moran is a 63 y.o. female with past medical history significant for chronic pain, diarrhea, COPD chronic hypoxia on 3 L oxygen nasal cannula, hepatitis C, who presents for evaluation of shortness of breath.  Patient states she has had shortness of breath x2-3 days.  Patient states she has using her home nebulizers, however is been out of her albuterol inhaler.  States she noted shortness of breath when she has been off into the nurses station at her living facility.  Patient states she was on hospice for end-stage COPD, however was taken off of this due to "rebound."  Has had some mild chest pain, however states this usually occurs when "I cough a lot."  Denies fever, chills, pain, neck stiffness, abdominal pain, nausea, vomiting, diarrhea, dysuria.  Patient states approximately 1 week ago he did stay with her daughter and she did not have her nebulizer treatments.  Has had known sick contacts at her facility as well as her granddaughter.  Denies influenza vaccine.  Denies additional aggravating or alleviating factors.  Chest pain is nonexertional nature, no associated lightheadedness, dizziness, diaphoresis, radiation to arms or jaw.  Had productive cough of white, light yellow sputum.  Has history of DVT, PE, no hemoptysis, lower extremity swelling or tenderness, no recent surgeries, immobilization or exogenous hormone use.  History obtained from patient.  No interpreter was used.     HPI  Past Medical History:  Diagnosis Date  . Anemia    "as a child"  . Anxiety   . Arthritis    "shoulders; back" (03/16/2015)  . Chronic back pain   . Chronic bronchitis ([place])    "I basically keep it" (03/16/2015)  . Chronic pain   . COPD (chronic obstructive pulmonary disease) ([place])     . DDD (degenerative disc disease), lumbar   . Degenerative disc disease   . Depression   . Ectatic aorta ([place]) 07/08/2017  . Family history of adverse reaction to anesthesia    "my mother had an allergic reaction when she had kidney removed back in the '60's or '70's""  . Headache    "monthly; need glasses; comes on when I'm stressed or get too tired" (03/16/2015)  . Hepatitis C   . History of stomach ulcers   . Hypertension   . Kidney stone   . MRSA carrier 07/09/2017  . On home O2    "~ 3L once/wk and prn" (03/16/2015)  . Pain management   . Pneumonia    "@ least once/yr for the past 8 yrs" (03/16/2015)  . Spinal stenosis   . Tobacco use     Patient Active Problem List   Diagnosis Date Noted  . Protein-calorie malnutrition, severe 07/10/2017  . MRSA carrier 07/09/2017  . Anemia 07/08/2017  . Renal insufficiency 07/08/2017  . Ectatic aorta ([place]) 07/08/2017  . COPD exacerbation ([place]) 07/19/2016  . Lung nodule 07/19/2016  . Septic shock ([place]) 04/05/2016  . HCAP (healthcare-associated pneumonia) 04/05/2016  . CAP (community acquired pneumonia) 03/07/2016  . Acute respiratory failure with hypoxia ([place]) 03/16/2015  . COPD (chronic obstructive pulmonary disease) ([place]) 03/16/2015  . HTN (hypertension) 03/16/2015  . Leukocytosis 03/16/2015  . Hyperglycemia 03/16/2015  . Chronic pain syndrome 03/16/2015  . Hepatitis C 12/18/2007  . TOBACCO ABUSE 09/11/2007  .  Substance abuse ([place]) 09/11/2007    Past Surgical History:  Procedure Laterality Date  . ANTERIOR CERVICAL DECOMP/DISCECTOMY FUSION  2002  . BACK SURGERY    . FEMUR IM NAIL Right 11/28/2012   Procedure: INTRAMEDULLARY (IM) RETROGRADE FEMORAL NAILING wants jackson table , c-arm and biomet ;  Surgeon: Mauri Pole, MD;  Location: [facility] ORS;  Service: Orthopedics;  Laterality: Right;  . FRACTURE SURGERY    . INCONTINENCE SURGERY  1998  . TOTAL ABDOMINAL HYSTERECTOMY  1998     OB History   No obstetric history on file.       Home Medications    Prior to Admission medications   Medication Sig Start Date End Date Taking? Authorizing Provider  amLODipine (NORVASC) 5 MG tablet Take 1 tablet (5 mg total) by mouth daily. 07/12/17  Yes Mariel Aloe, MD  DULoxetine (CYMBALTA) 30 MG capsule Take 30 mg by mouth at bedtime.  11/21/18  Yes [provider]  DULoxetine (CYMBALTA) 60 MG capsule Take 60 mg by mouth daily.    Yes [provider]  gabapentin (NEURONTIN) 800 MG tablet Take 800 mg by mouth 4 (four) times daily.    Yes [provider]  LORazepam (ATIVAN) 0.5 MG tablet Take 0.5 mg by mouth 4 (four) times daily.  11/20/18  Yes [provider]  LORazepam (ATIVAN) 1 MG tablet Take 1 mg by mouth every 4 (four) hours as needed for anxiety.  05/19/18  Yes [provider]  potassium chloride (K-DUR,KLOR-CON) 10 MEQ tablet Take 10 mEq by mouth daily.   Yes [provider]  predniSONE (DELTASONE) 10 MG tablet Take 10 mg by mouth daily with breakfast.  11/21/18  Yes [provider]  sertraline (ZOLOFT) 100 MG tablet Take 100 mg by mouth daily.  11/21/18  Yes [provider]  tiZANidine (ZANAFLEX) 4 MG tablet Take 4 mg by mouth 2 (two) times daily as needed for muscle spasms.  07/18/16  Yes [provider]  albuterol (PROVENTIL HFA;VENTOLIN HFA) 108 (90 Base) MCG/ACT inhaler Inhale 1-2 puffs into the lungs every 6 (six) hours as needed for wheezing or shortness of breath. 12/05/18   Darby Fleeman A, PA-C  albuterol (PROVENTIL) (2.5 MG/3ML) 0.083% nebulizer solution Take 3 mLs (2.5 mg total) by nebulization every 6 (six) hours as needed for wheezing or shortness of breath (buy this only if you cannot afford the Duoneb). Patient not taking: Reported on 12/05/2018 05/29/18   Jola Schmidt, MD  doxycycline (VIBRAMYCIN) 100 MG capsule Take 1 capsule (100 mg total) by mouth 2 (two) times daily. 12/05/18   Ananiah Maciolek A, PA-C  feeding supplement, ENSURE  ENLIVE, (ENSURE ENLIVE) LIQD Take 237 mLs by mouth 3 (three) times daily between meals. Patient not taking: Reported on 05/29/2018 07/11/17   Mariel Aloe, MD  Fluticasone-Salmeterol (ADVAIR DISKUS) 250-50 MCG/DOSE AEPB Inhale 1 puff into the lungs 2 (two) times daily. Patient not taking: Reported on 05/29/2018 01/25/17   Jola Schmidt, MD  ibuprofen (ADVIL,MOTRIN) 200 MG tablet Take 800 mg by mouth every 6 (six) hours as needed for moderate pain.     [provider]  tiotropium (SPIRIVA HANDIHALER) 18 MCG inhalation capsule Place 1 capsule (18 mcg total) into inhaler and inhale daily. Patient not taking: Reported on 05/29/2018 04/08/16   Debbe Odea, MD    Family History Family History  Problem Relation Age of Onset  . Cancer Mother   . Emphysema Mother   . Heart failure Father   .  Stroke Father     Social History Social History   Tobacco Use  . Smoking status: Former Smoker    Packs/day: 0.25    Years: 36.00    Pack years: 9.00    Types: Cigarettes  . Smokeless tobacco: Never Used  Substance Use Topics  . Alcohol use: No    Comment: denies h/o alcohol abuse on 03/16/2015  . Drug use: Yes    Types: Marijuana    Comment: 03/17/2015 History of narcotic abuse, "currently with [facility] on Methadone; I've been clean for almost 2 yrs now"     Allergies   Aspirin   Review of Systems Review of Systems  Constitutional: Negative.   HENT: Negative.   Eyes: Negative.   Respiratory: Positive for cough and shortness of breath. Negative for apnea, choking, chest tightness, wheezing and stridor.   Cardiovascular: Positive for chest pain. Negative for palpitations and leg swelling.  Gastrointestinal: Negative.   Genitourinary: Negative.   Musculoskeletal: Negative.   Skin: Negative.   Neurological: Negative.   All other systems reviewed and are negative.    Physical Exam Updated Vital Signs BP 118/85   Pulse 82   Temp 97.6 F (36.4 C) (Oral)   Resp  16   SpO2 92%   Physical Exam Vitals signs and nursing note reviewed.  Constitutional:      General: She is not in acute distress.    Appearance: She is not toxic-appearing or diaphoretic.     Comments: Chronically ill-appearing  HENT:     Head: Atraumatic.     Mouth/Throat:     Comments: Posterior oropharynx clear.  Mucous membranes moist. Eyes:     Pupils: Pupils are equal, round, and reactive to light.  Neck:     Musculoskeletal: Normal range of motion.     Comments: No neck stiffness or neck rigidity.  No Meningismus. Cardiovascular:     Rate and Rhythm: Normal rate and regular rhythm.  Pulmonary:     Effort: No respiratory distress.     Comments: Diffuse wheeze and rhonchi throughout.  No accessory muscle usage.  Able to speak in full sentences without difficulty.  On 3 L oxygen nasal cannula, at baseline. Chest:     Comments: No chest wall tenderness to palpation. Abdominal:     General: There is no distension.     Comments: Soft nontender without rebound or guarding.  Normoactive bowel sounds.  Musculoskeletal: Normal range of motion.     Comments: Moves all extremities without difficulty.  Lower extremity compartments are soft.  No edema, erythema, ecchymosis or warmth.  Negative Homans sign bilaterally.  Skin:    General: Skin is warm and dry.     Comments: No rashes or lesions. Brisk capillary refill.  Neurological:     Mental Status: She is alert.      ED Treatments / Results  Labs (all labs ordered are listed, but only abnormal results are displayed) Labs Reviewed  CBC WITH DIFFERENTIAL/PLATELET - Abnormal; Notable for the following components:      Result Value   WBC 11.4 (*)    MCV 102.7 (*)    Neutro Abs 9.3 (*)    Abs Immature Granulocytes 0.09 (*)    All other components within normal limits  CULTURE, BLOOD (ROUTINE X 2)  CULTURE, BLOOD (ROUTINE X 2)  LACTIC ACID, PLASMA  INFLUENZA PANEL BY PCR (TYPE A & B)  COMPREHENSIVE METABOLIC PANEL  LACTIC  ACID, PLASMA  I-STAT TROPONIN, ED  EKG None  Radiology Dg Chest 2 View  Result Date: 12/05/2018 CLINICAL DATA:  COPD exacerbation with progressively worsening shortness of breath over the past several days. Current smoker. EXAM: CHEST - 2 VIEW COMPARISON:  05/29/2018 and earlier, including CTA chest 07/08/2017. FINDINGS: Cardiac silhouette normal in size, unchanged. Thoracic aorta mildly atherosclerotic. Hilar and mediastinal contours otherwise unremarkable. Bullous emphysematous changes in both lungs with a very large bleb encompassing most of the RIGHT UPPER LOBE. Vascular redistribution and interstitial fibrosis involving the mid and LOWER lungs, unchanged over multiple prior examinations. No new pulmonary parenchymal abnormalities. No pleural effusions. Mild degenerative changes involving the thoracic spine. IMPRESSION: 1. No acute cardiopulmonary disease. 2.  Emphysema, severe.  (ICD10-J43.9) 3.  Aortic Atherosclerosis (ICD10-170.0) Electronically Signed   By: Evangeline Dakin M.D.   On: 12/05/2018 13:14    Procedures Procedures (including critical care time)  Medications Ordered in ED Medications  gabapentin (NEURONTIN) capsule 800 mg (800 mg Oral Given 12/05/18 1519)  LORazepam (ATIVAN) tablet 1 mg (1 mg Oral Given 12/05/18 1445)  albuterol (PROVENTIL) (2.5 MG/3ML) 0.083% nebulizer solution 5 mg (5 mg Nebulization Given 12/05/18 1326)  ipratropium (ATROVENT) nebulizer solution 0.5 mg (0.5 mg Nebulization Given 12/05/18 1326)  sodium chloride 0.9 % bolus 500 mL (0 mLs Intravenous Stopped 12/05/18 1439)  predniSONE (DELTASONE) tablet 60 mg (60 mg Oral Given 12/05/18 1446)  albuterol (PROVENTIL) (2.5 MG/3ML) 0.083% nebulizer solution 5 mg (5 mg Nebulization Given 12/05/18 1446)  ipratropium (ATROVENT) nebulizer solution 0.5 mg (0.5 mg Nebulization Given 12/05/18 1446)    Initial Impression / Assessment and Plan / ED Course  I have reviewed the triage vital signs and the nursing  notes.  Pertinent labs & imaging results that were available during my care of the patient were reviewed by me and considered in my medical decision making (see chart for details).  63 year old female appears chronically ill presents for evaluation of shortness of breath.  Afebrile, nonseptic appearing.  Patient with chronic hypoxia secondary to COPD on 3 L oxygen via nasal cannula.  Previously on hospice for end-stage COPD, however was taken off of this.  Has been using home nebulizers, however is been out of her albuterol inhaler.  Lungs with diffuse wheezing and rhonchi throughout.  No accessory muscle usage.  No tachypnea, tachycardia or hypoxia on patient's baseline oxygen.  Has had some chest pain, however this is when she is coughing.  No chest pain with exertion, pleuritic chest pain, hemoptysis, radiation to arms or jaw, associated lightheaded dizziness or associated diaphoresis.  Lower extremities soft without erythema, swelling or tenderness.  No factors for PE.  Has been around sick contacts.  Concern for COPD exacerbation versus pneumonia.  Will obtain labs, imaging, EKG and reevaluate.  1345: CBC with mild leukocytosis 11.4, previous 11.4.  Metabolic panel without electrolyte, renal or liver abnormality, troponin negative, Lactic acid 0.6, Influenza negative, Chest xray with large bleb to RU Lung, No infiltrates, severe COPD. Patient with improvement after breathing treatment. Will give additional and reevaluate. Low suspicion for ACS, PE or dissection causing patients symptoms.  1500: Patient with improvement after breathing treatment.  We will plan for ambulation.  If patient able to ambulate without hypoxia feel patient safe to DC home.  Will DC home with albuterol inhaler, doxycycline prescription as well as referral to [facility] pulmonology.  Care transferred to [facility], [place] who will determine ultimate treatment, plan and disposition.     Patient has been seen and evaluated by my  attending  Dr. Venora Maples who agrees with above treatment, plan and disposition. Final Clinical Impressions(s) / ED Diagnoses   Final diagnoses:  None    ED Discharge Orders         Ordered    albuterol (PROVENTIL HFA;VENTOLIN HFA) 108 (90 Base) MCG/ACT inhaler  Every 6 hours PRN     12/05/18 1545    doxycycline (VIBRAMYCIN) 100 MG capsule  2 times daily     12/05/18 1545           Adams Hinch A, PA-C 12/05/18 1548    Jola Schmidt, MD 12/05/18 1550

## 2018-12-05 NOTE — ED Notes (Signed)
Bed: VQ00 Expected date:  Expected time:  Means of arrival:  Comments: [id number] possible pneumonia SNF

## 2018-12-05 NOTE — ED Notes (Signed)
Both blood cultures taken and sent to lab.

## 2018-12-05 NOTE — ED Notes (Signed)
PA will discontinue 2nd lactic.

## 2018-12-05 NOTE — ED Notes (Signed)
[facility] paper work in bin.

## 2018-12-05 NOTE — Discharge Instructions (Addendum)
Your evaluated today for shortness of breath.  You were given breathing treatments in the emergency department.  We will send you home with an albuterol inhaler.  Please take as prescribed.  Also give you a prescription for doxycycline antibiotics.  Please take as prescribed.  I have given you a referral to [facility] pulmonology.  Please call to schedule an appointment.

## 2018-12-05 NOTE — ED Provider Notes (Signed)
  Physical Exam  BP 118/85   Pulse 82   Temp 97.6 F (36.4 C) (Oral)   Resp 16   SpO2 92%   Physical Exam Vitals signs and nursing note reviewed.  Constitutional:      General: She is not in acute distress.    Appearance: She is well-developed. She is not diaphoretic.  HENT:     Head: Normocephalic and atraumatic.  Eyes:     General: No scleral icterus.    Conjunctiva/sclera: Conjunctivae normal.  Neck:     Musculoskeletal: Normal range of motion.  Pulmonary:     Effort: Pulmonary effort is normal. No respiratory distress.  Skin:    Findings: No rash.  Neurological:     Mental Status: She is alert.     ED Course/Procedures     Procedures  MDM  Care handed off from previous provider, PA H[place]. Please see their note for further detail. Briefly, patient with a history of COPD on 3L of oxygen via nasal cannula at all times presents for SOB for three days. She ran out of her albuterol inhaler but has been using her home nebulizer treatments. Workup here is reassuring, she remains comfortable on 3L of oxygen. Second neb treatment being given at time of handoff. Plan is to ambulate with pulse ox and reassess. Patient improved with treatments. Will d/c home with doxy and albuterol.  4:35 PM Patient ambulated without difficulty with oxygen saturations remaining >90%.  She is requesting discharge home.  Will give refill of albuterol inhaler and prescription for doxycycline and have her follow-up with her PCP.        Delia Heady, PA-C 12/05/18 1637    Maudie Flakes, MD 12/05/18 [phone]

## 2018-12-05 NOTE — ED Notes (Signed)
[facility] has been dispatched.

## 2018-12-10 LAB — CULTURE, BLOOD (ROUTINE X 2)
Culture: NO GROWTH
Culture: NO GROWTH
Special Requests: ADEQUATE
Special Requests: ADEQUATE

## 2018-12-22 ENCOUNTER — Telehealth: Payer: Self-pay

## 2018-12-25 ENCOUNTER — Other Ambulatory Visit: Payer: Self-pay

## 2018-12-25 ENCOUNTER — Ambulatory Visit (INDEPENDENT_AMBULATORY_CARE_PROVIDER_SITE_OTHER): Payer: Medicare Other | Admitting: Pulmonary Disease

## 2018-12-25 ENCOUNTER — Telehealth: Payer: Self-pay | Admitting: Pulmonary Disease

## 2018-12-25 ENCOUNTER — Encounter: Payer: Self-pay | Admitting: Pulmonary Disease

## 2018-12-25 VITALS — BP 118/80 | HR 95 | Ht 66.0 in | Wt 116.2 lb

## 2018-12-25 DIAGNOSIS — R64 Cachexia: Secondary | ICD-10-CM

## 2018-12-25 DIAGNOSIS — Z7952 Long term (current) use of systemic steroids: Secondary | ICD-10-CM | POA: Diagnosis not present

## 2018-12-25 DIAGNOSIS — J9611 Chronic respiratory failure with hypoxia: Secondary | ICD-10-CM | POA: Diagnosis not present

## 2018-12-25 DIAGNOSIS — Z66 Do not resuscitate: Secondary | ICD-10-CM

## 2018-12-25 DIAGNOSIS — J449 Chronic obstructive pulmonary disease, unspecified: Secondary | ICD-10-CM | POA: Diagnosis not present

## 2018-12-25 MED ORDER — BUDESONIDE 0.5 MG/2ML IN SUSP: 0.5000 mg | Freq: Two times a day (BID) | RESPIRATORY_TRACT | 11 refills | Status: DC

## 2018-12-25 MED ORDER — FORMOTEROL FUMARATE 20 MCG/2ML IN NEBU: 20.0000 ug | INHALATION_SOLUTION | Freq: Two times a day (BID) | RESPIRATORY_TRACT | 11 refills | Status: DC

## 2018-12-25 MED ORDER — ALBUTEROL SULFATE HFA 108 (90 BASE) MCG/ACT IN AERS: 2.0000 | INHALATION_SPRAY | Freq: Four times a day (QID) | RESPIRATORY_TRACT | 6 refills | Status: AC | PRN

## 2018-12-25 MED ORDER — VARENICLINE TARTRATE 0.5 MG X 11 & 1 MG X 42 PO MISC: ORAL | 0 refills | Status: DC

## 2018-12-25 MED ORDER — REVEFENACIN 175 MCG/3ML IN SOLN: 3.0000 mL | Freq: Every day | RESPIRATORY_TRACT | 11 refills | Status: AC

## 2018-12-25 MED ORDER — ALBUTEROL SULFATE (2.5 MG/3ML) 0.083% IN NEBU: 2.5000 mg | INHALATION_SOLUTION | RESPIRATORY_TRACT | 5 refills | Status: DC | PRN

## 2018-12-25 MED ORDER — NEBULIZER COMPRESSOR MISC: 0 refills | Status: AC

## 2018-12-25 NOTE — Telephone Encounter (Signed)
PCCM: Unfortunately there are no other medications like chantix.  She could consider nicotine replacements such as gum, lozenges or patches Garner Nash, DO [facility] 12/25/2018 5:00 PM

## 2018-12-25 NOTE — Telephone Encounter (Signed)
Called and spoke with patient, she stated that her insurance will not cover the prescription of Chantix. Patient wanted to know if there were other options so that she did not have to pay out of pocket. Please advise, thank you.

## 2018-12-25 NOTE — Progress Notes (Signed)
Synopsis: Referred in March 2020 for severe COPD by Nolene Ebbs, MD  Subjective:   PATIENT ID: Emily Moran GENDER: female DOB: [birth year], MRN: [id number]  Chief Complaint  Patient presents with  . Consult    Recent COPD exacerbation. Needs to establish with pulmonologist. States she has been using oxygen for 5 years. States over the last couple months her SOB has gotten worse. Previously was being followed by hospice but recently was D/C. Currently resides at [facility] in [place],    [place] former smoker, smoked for 50 years, at heaviest 1 ppd and now down to 0.25-0.5 ppd. She has not been able to quit.  She was admitted to the hospital this past year and discharged with home hospice care.  She is currently residing in an assisted living.  At [facility].  She has not ever established care with a pulmonologist.  She is currently managed with oral prednisone 10 mg daily.  She has recently been discharged from hospice care as she had not shown any significant decline.  She does remain a durable DNR.  Her symptoms are somewhat under control however she is not on any regular scheduled maintenance medications.  She has been using a Spiriva HandiHaler in the past but currently not using any of those medications.  She is interested in outpatient palliative care.   Past Medical History:  Diagnosis Date  . Anemia    "as a child"  . Anxiety   . Arthritis    "shoulders; back" (03/16/2015)  . Chronic back pain   . Chronic bronchitis ([place])    "I basically keep it" (03/16/2015)  . Chronic pain   . COPD (chronic obstructive pulmonary disease) ([place])   . DDD (degenerative disc disease), lumbar   . Degenerative disc disease   . Depression   . Ectatic aorta ([place]) 07/08/2017  . Family history of adverse reaction to anesthesia    "my mother had an allergic reaction when she had kidney removed back in the '60's or '70's""  . Headache    "monthly; need glasses; comes on when  I'm stressed or get too tired" (03/16/2015)  . Hepatitis C   . History of stomach ulcers   . Hypertension   . Kidney stone   . MRSA carrier 07/09/2017  . On home O2    "~ 3L once/wk and prn" (03/16/2015)  . Pain management   . Pneumonia    "@ least once/yr for the past 8 yrs" (03/16/2015)  . Spinal stenosis   . Tobacco use      Family History  Problem Relation Age of Onset  . Cancer Mother   . Emphysema Mother   . Heart failure Father   . Stroke Father      Past Surgical History:  Procedure Laterality Date  . ANTERIOR CERVICAL DECOMP/DISCECTOMY FUSION  2002  . BACK SURGERY    . FEMUR IM NAIL Right 11/28/2012   Procedure: INTRAMEDULLARY (IM) RETROGRADE FEMORAL NAILING wants jackson table , c-arm and biomet ;  Surgeon: Mauri Pole, MD;  Location: [facility];  Service: Orthopedics;  Laterality: Right;  . FRACTURE SURGERY    . INCONTINENCE SURGERY  1998  . TOTAL ABDOMINAL HYSTERECTOMY  1998    Social History   Socioeconomic History  . Marital status: Legally Separated    Spouse name: Not on file  . Number of children: Not on file  . Years of education: Not on file  . Highest education level:  Not on file  Occupational History  . Not on file  Social Needs  . Financial resource strain: Not on file  . Food insecurity:    Worry: Not on file    Inability: Not on file  . Transportation needs:    Medical: Not on file    Non-medical: Not on file  Tobacco Use  . Smoking status: Former Smoker    Packs/day: 0.25    Years: 36.00    Pack years: 9.00    Types: Cigarettes  . Smokeless tobacco: Never Used  . Tobacco comment: 1/2 pack per day 12/25/18.   Substance and Sexual Activity  . Alcohol use: No    Comment: denies h/o alcohol abuse on 03/16/2015  . Drug use: Yes    Types: Marijuana    Comment: 03/17/2015 History of narcotic abuse, "currently with [facility] on Methadone; I've been clean for almost 2 yrs now"  . Sexual activity: Not Currently  Lifestyle  .  Physical activity:    Days per week: Not on file    Minutes per session: Not on file  . Stress: Not on file  Relationships  . Social connections:    Talks on phone: Not on file    Gets together: Not on file    Attends religious service: Not on file    Active member of club or organization: Not on file    Attends meetings of clubs or organizations: Not on file    Relationship status: Not on file  . Intimate partner violence:    Fear of current or ex partner: Not on file    Emotionally abused: Not on file    Physically abused: Not on file    Forced sexual activity: Not on file  Other Topics Concern  . Not on file  Social History Narrative  . Not on file     Allergies  Allergen Reactions  . Aspirin     Upset stomach     Outpatient Medications Prior to Visit  Medication Sig Dispense Refill  . albuterol (PROVENTIL) (2.5 MG/3ML) 0.083% nebulizer solution Take 3 mLs (2.5 mg total) by nebulization every 6 (six) hours as needed for wheezing or shortness of breath (buy this only if you cannot afford the Duoneb). 75 mL 12  . amLODipine (NORVASC) 5 MG tablet Take 1 tablet (5 mg total) by mouth daily. 30 tablet 0  . DULoxetine (CYMBALTA) 30 MG capsule Take 30 mg by mouth at bedtime.     . DULoxetine (CYMBALTA) 60 MG capsule Take 60 mg by mouth daily.     . feeding supplement, ENSURE ENLIVE, (ENSURE ENLIVE) LIQD Take 237 mLs by mouth 3 (three) times daily between meals. 60 Bottle 0  . gabapentin (NEURONTIN) 800 MG tablet Take 800 mg by mouth 4 (four) times daily.     Marland Kitchen ibuprofen (ADVIL,MOTRIN) 200 MG tablet Take 800 mg by mouth every 6 (six) hours as needed for moderate pain.     . Lactobacillus-Inulin ([place]) CAPS Take by mouth daily.    Marland Kitchen LORazepam (ATIVAN) 0.5 MG tablet Take 0.5 mg by mouth 4 (four) times daily.     Marland Kitchen LORazepam (ATIVAN) 1 MG tablet Take 1 mg by mouth every 4 (four) hours as needed for anxiety.     . potassium chloride (K-DUR,KLOR-CON) 10 MEQ tablet  Take 10 mEq by mouth daily.    . predniSONE (DELTASONE) 10 MG tablet Take 10 mg by mouth daily with breakfast.     .  sertraline (ZOLOFT) 100 MG tablet Take 100 mg by mouth daily.     Marland Kitchen tiotropium (SPIRIVA HANDIHALER) 18 MCG inhalation capsule Place 1 capsule (18 mcg total) into inhaler and inhale daily. 30 capsule 0  . tiZANidine (ZANAFLEX) 4 MG tablet Take 4 mg by mouth 2 (two) times daily as needed for muscle spasms.     Marland Kitchen albuterol (PROVENTIL HFA;VENTOLIN HFA) 108 (90 Base) MCG/ACT inhaler Inhale 1-2 puffs into the lungs every 6 (six) hours as needed for wheezing or shortness of breath. (Patient not taking: Reported on 12/25/2018) 1 Inhaler 0  . doxycycline (VIBRAMYCIN) 100 MG capsule Take 1 capsule (100 mg total) by mouth 2 (two) times daily. (Patient not taking: Reported on 12/25/2018) 20 capsule 0  . Fluticasone-Salmeterol (ADVAIR DISKUS) 250-50 MCG/DOSE AEPB Inhale 1 puff into the lungs 2 (two) times daily. (Patient not taking: Reported on 12/25/2018) 60 each 0   No facility-administered medications prior to visit.     Review of Systems  Constitutional: Negative for chills, fever, malaise/fatigue and weight loss.  HENT: Negative for hearing loss, sore throat and tinnitus.   Eyes: Negative for blurred vision and double vision.  Respiratory: Positive for cough, shortness of breath and wheezing. Negative for hemoptysis, sputum production and stridor.   Cardiovascular: Negative for chest pain, palpitations, orthopnea, leg swelling and PND.  Gastrointestinal: Negative for abdominal pain, constipation, diarrhea, heartburn, nausea and vomiting.  Genitourinary: Negative for dysuria, hematuria and urgency.  Musculoskeletal: Negative for joint pain and myalgias.  Skin: Negative for itching and rash.  Neurological: Negative for dizziness, tingling, weakness and headaches.  Endo/Heme/Allergies: Negative for environmental allergies. Does not bruise/bleed easily.  Psychiatric/Behavioral: Negative for  depression. The patient is not nervous/anxious and does not have insomnia.   All other systems reviewed and are negative.   Objective:  Physical Exam Vitals signs reviewed.  Constitutional:      General: She is not in acute distress.    Appearance: She is well-developed.  HENT:     Head: Normocephalic and atraumatic.     Mouth/Throat:     Pharynx: No oropharyngeal exudate.  Eyes:     Conjunctiva/sclera: Conjunctivae normal.     Pupils: Pupils are equal, round, and reactive to light.  Neck:     Vascular: No JVD.     Trachea: No tracheal deviation.     Comments: Loss of supraclavicular fat Cardiovascular:     Rate and Rhythm: Normal rate and regular rhythm.     Heart sounds: S1 normal and S2 normal.     Comments: Distant heart tones Pulmonary:     Effort: No tachypnea or accessory muscle usage.     Breath sounds: No stridor. Decreased breath sounds (throughout all lung fields) present. No wheezing, rhonchi or rales.  Abdominal:     General: Bowel sounds are normal. There is no distension.     Palpations: Abdomen is soft.     Tenderness: There is no abdominal tenderness.  Musculoskeletal:        General: Deformity (muscle wasting ) present.  Skin:    General: Skin is warm and dry.     Capillary Refill: Capillary refill takes less than 2 seconds.     Findings: No rash.  Neurological:     Mental Status: She is alert and oriented to person, place, and time.  Psychiatric:        Behavior: Behavior normal.      Vitals:   12/25/18 1139  BP: 118/80  Pulse: 95  SpO2: 99%  Weight: 116 lb 3.2 oz (52.7 kg)  Height: 5\' 6"  (1.676 m)   99% on 3L BMI Readings from Last 3 Encounters:  12/25/18 18.76 kg/m  07/09/17 18.24 kg/m  11/12/16 20.98 kg/m   Wt Readings from Last 3 Encounters:  12/25/18 116 lb 3.2 oz (52.7 kg)  07/09/17 113 lb (51.3 kg)  11/12/16 130 lb (59 kg)     CBC    Component Value Date/Time   WBC 11.4 (H) 12/05/2018 1256   RBC 4.45 12/05/2018 1256    HGB 13.7 12/05/2018 1256   HCT 45.7 12/05/2018 1256   PLT 217 12/05/2018 1256   MCV 102.7 (H) 12/05/2018 1256   MCH 30.8 12/05/2018 1256   MCHC 30.0 12/05/2018 1256   RDW 13.1 12/05/2018 1256   LYMPHSABS 1.5 12/05/2018 1256   MONOABS 0.4 12/05/2018 1256   EOSABS 0.0 12/05/2018 1256   BASOSABS 0.1 12/05/2018 1256     Chest Imaging: 12/05/2018 chest x-ray: Hyperinflated lung fields consistent with emphysema. The patient's images have been independently reviewed by me.    Pulmonary Functions Testing Results: No flowsheet data found.   FeNO: None   Pathology: None   Echocardiogram: None   Heart Catheterization: None     Assessment & Plan:   COPD, severe (HCC)  Chronic respiratory failure with hypoxia (HCC)  Current chronic use of systemic steroids  DNR (do not resuscitate)  Cachexia ([place])  Discussion:  This is a 63 year old female with evidence of severe COPD and emphysema with chronic hypoxemic respiratory failure.  She has been on hospice care in the past.  She does remain a durable DNR.  I agree with this.  I do think that she may benefit from outpatient palliative care visits.  She does need to quit smoking.  We will start her on a Chantix starter pack.  Today in the office we discussed risk benefits and alternatives of different methods of smoking cessation.  She would like to try Chantix to see if it will help decrease her cravings.  I do believe she also needs management of her COPD and emphysema with nebulized therapy. We will start her on Pulmicort twice daily Yupelri as well as Perforomist nebulizers. We will also give her an albuterol nebulizer and refills of her albuterol inhaler. We have placed a referral for outpatient palliative care encounters.  At some point she may benefit for transitioning to hospice care as well. We will recommend dropping oral prednisone to 5 mg daily.  Patient return to clinic in 3 months.  Greater than 50% of this patient's  60-minute office visit was spent face-to-face discussing above recommendations and treatment plan.    Current Outpatient Medications:  .  albuterol (PROVENTIL) (2.5 MG/3ML) 0.083% nebulizer solution, Take 3 mLs (2.5 mg total) by nebulization every 6 (six) hours as needed for wheezing or shortness of breath (buy this only if you cannot afford the Duoneb)., Disp: 75 mL, Rfl: 12 .  amLODipine (NORVASC) 5 MG tablet, Take 1 tablet (5 mg total) by mouth daily., Disp: 30 tablet, Rfl: 0 .  DULoxetine (CYMBALTA) 30 MG capsule, Take 30 mg by mouth at bedtime. , Disp: , Rfl:  .  DULoxetine (CYMBALTA) 60 MG capsule, Take 60 mg by mouth daily. , Disp: , Rfl:  .  feeding supplement, ENSURE ENLIVE, (ENSURE ENLIVE) LIQD, Take 237 mLs by mouth 3 (three) times daily between meals., Disp: 60 Bottle, Rfl: 0 .  gabapentin (NEURONTIN) 800 MG tablet, Take 800 mg by  mouth 4 (four) times daily. , Disp: , Rfl:  .  ibuprofen (ADVIL,MOTRIN) 200 MG tablet, Take 800 mg by mouth every 6 (six) hours as needed for moderate pain. , Disp: , Rfl:  .  Lactobacillus-Inulin ([place]) CAPS, Take by mouth daily., Disp: , Rfl:  .  LORazepam (ATIVAN) 0.5 MG tablet, Take 0.5 mg by mouth 4 (four) times daily. , Disp: , Rfl:  .  LORazepam (ATIVAN) 1 MG tablet, Take 1 mg by mouth every 4 (four) hours as needed for anxiety. , Disp: , Rfl:  .  potassium chloride (K-DUR,KLOR-CON) 10 MEQ tablet, Take 10 mEq by mouth daily., Disp: , Rfl:  .  predniSONE (DELTASONE) 10 MG tablet, Take 10 mg by mouth daily with breakfast. , Disp: , Rfl:  .  sertraline (ZOLOFT) 100 MG tablet, Take 100 mg by mouth daily. , Disp: , Rfl:  .  tiotropium (SPIRIVA HANDIHALER) 18 MCG inhalation capsule, Place 1 capsule (18 mcg total) into inhaler and inhale daily., Disp: 30 capsule, Rfl: 0 .  tiZANidine (ZANAFLEX) 4 MG tablet, Take 4 mg by mouth 2 (two) times daily as needed for muscle spasms. , Disp: , Rfl:    Garner Nash, DO [facility] 12/25/2018 12:01 PM

## 2018-12-25 NOTE — Patient Instructions (Addendum)
Thank you for visiting Dr. Valeta Harms at [facility]. Today we recommend the following: Orders Placed This Encounter  Procedures  . Amb Referral to Palliative Care   Meds ordered this encounter  Medications  . budesonide (PULMICORT) 0.5 MG/2ML nebulizer solution    Sig: Take 2 mLs (0.5 mg total) by nebulization 2 (two) times daily for 30 days.    Dispense:  120 mL    Refill:  11  . Revefenacin (YUPELRI) 175 MCG/3ML SOLN    Sig: Inhale 3 mLs into the lungs daily for 30 days.    Dispense:  90 mL    Refill:  11  . formoterol (PERFOROMIST) 20 MCG/2ML nebulizer solution    Sig: Take 2 mLs (20 mcg total) by nebulization 2 (two) times daily for 30 days.    Dispense:  120 mL    Refill:  11  . albuterol (PROVENTIL) (2.5 MG/3ML) 0.083% nebulizer solution    Sig: Take 3 mLs (2.5 mg total) by nebulization every 4 (four) hours as needed for wheezing or shortness of breath.    Dispense:  75 mL    Refill:  5  . albuterol (PROVENTIL HFA;VENTOLIN HFA) 108 (90 Base) MCG/ACT inhaler    Sig: Inhale 2 puffs into the lungs every 6 (six) hours as needed for wheezing or shortness of breath.    Dispense:  1 Inhaler    Refill:  6  . Nebulizers (NEBULIZER COMPRESSOR) MISC    Sig: Use as directed    Dispense:  1 each    Refill:  0   We will recommend dropping oral prednisone to 5 mg daily.  Return in about 3 months (around 03/27/2019).

## 2018-12-26 ENCOUNTER — Telehealth: Payer: Self-pay | Admitting: Adult Health Nurse Practitioner

## 2018-12-26 NOTE — Telephone Encounter (Signed)
Unable to reach LMTCB 

## 2018-12-26 NOTE — Telephone Encounter (Signed)
Looks like encounter was open in error so closing encounter. 

## 2018-12-26 NOTE — Telephone Encounter (Signed)
Missed patient at facility this morning.  Spoke with daughter and confirmed that she should be at facility on Monday afternoon.  Will go see patient on Monday, 12/29/2018 and call daughter back with follow up Portia Wisdom K. Olena Heckle NP

## 2018-12-29 ENCOUNTER — Telehealth: Payer: Self-pay

## 2018-12-29 ENCOUNTER — Other Ambulatory Visit: Payer: Self-pay

## 2018-12-29 ENCOUNTER — Non-Acute Institutional Stay: Payer: Medicare Other | Admitting: Adult Health Nurse Practitioner

## 2018-12-29 DIAGNOSIS — Z515 Encounter for palliative care: Secondary | ICD-10-CM

## 2018-12-29 NOTE — Telephone Encounter (Signed)
LVM for patient regarding BI recommendations. X2

## 2018-12-29 NOTE — Progress Notes (Signed)
[facility] Consult Note Telephone: [phone]  Fax: [phone]  PATIENT NAME: Emily Moran DOB: 11/30/1957 MRN: [id number]  PRIMARY CARE PROVIDER:   Lynnell Catalan Moran  REFERRING PROVIDER:  June Leap MD (pulmonologist)  RESPONSIBLE PARTY:   Emily Moran, daughter  [phone]       RECOMMENDATIONS and PLAN:  1.  End stage COPD.  Patient was under hospice services and was discharged Emily of last year due to improvement.  Patient gained weight after being discharged from hospice.  States that she had gotten up to 120 pounds but recently has had decreased appetite and is down to 116.  States that it is getting harder to breath. Has had 2 ED visits last month for COPD exacerbations.  On 12/25/2018 went to see pulmonologist, Dr. Valeta Moran, and was started on neb treatments that include albuterol, formoterol, and budesonide and is on Yupleri.  Used O2 @ 3L via nasal canula continuously. Patient is able to ambulate unassisted and perform ADLs with minimal assistance but is becoming increasingly tired and out of breath when she does them. She was started on Chantix for smoking cessation but apparently insurance is not covering it and pharmacy has not sent it because the cost would be over $500.  Needs alternative that insurance will cover.   2. Pain.  Patient states having increased bone pain, especially in her back and around her rib cage.  Patient goes every morning to methadone clinic but is finding that this is getting harder to keep up with since she is having increased DOE and gets very tired with the exertion. She currently takes 60 mg methadone daily.  States that this used to keep her pain at a 3 out of 10 but now it just takes the edge off and her pain stays at a 6 out of 10.  With increased DOE, she would benefit from having her pain managed at the facility by her PCP, who comes in every Monday through [facility].  Patient does have a history of  narcotic abuse so this is something that will have to be taken into consideration.    3.  Appetite.  Patient does state that her appetite is decreasing.  As above has lost 4 pounds recently.  Currently has supplemental nutritional drinks ordered. Remeron at a low dose could help stimulate her appetite and help with depression as she states she deals with depression as well  4. Anxiety/Depression.  Patient states that she gets anxious and that her ativan helps with it. Currently on Ativan 0.5 mg QID and 1 mg QID PRN. Does voice some increased depression. Denies SI.  Is currently on zoloft 100 mg daily and cymbalta 60 mg daily and 30 mg QHS.  May benefit with the addition of remeron as stated above as this could help with depression and appetite.  5. Goals of Care.  DNR in place.  Filled out MOST form and left at facility.  Included on MOST form is limited interventions, antibiotics and IV fluids as indicated, and no feeding tubes.  Tried calling daughter a few times and there was no answer.  Did not leave VM as VM box was full and was unable to.  It did allow me to leave a SMS text with my number.  Tried calling PCP and left VM  I spent 60 minutes providing this consultation,  from 12:30 to 1:30. More than 50% of the time in this consultation was spent coordinating communication.  HISTORY OF PRESENT ILLNESS:  Emily Moran is a 63 y.o. year old female with multiple medical problems including end stage COPD, HTN, spinal stenosis, DDD, chronic back pain, depression, anxiety. Palliative Care was asked to help address goals of care.   CODE STATUS: DNR  PPS: 60% HOSPICE ELIGIBILITY/DIAGNOSIS: TBD  PHYSICAL EXAM:   General: NAD, frail appearing, thin Extremities: no edema, no joint deformities Skin: no rashes Neurological: Weakness but otherwise nonfocal  PAST MEDICAL HISTORY:  Past Medical History:  Diagnosis Date  . Anemia    "as a child"  . Anxiety   . Arthritis    "shoulders; back"  (03/16/2015)  . Chronic back pain   . Chronic bronchitis ([place])    "I basically keep it" (03/16/2015)  . Chronic pain   . COPD (chronic obstructive pulmonary disease) ([place])   . DDD (degenerative disc disease), lumbar   . Degenerative disc disease   . Depression   . Ectatic aorta ([place]) 07/08/2017  . Family history of adverse reaction to anesthesia    "my mother had an allergic reaction when she had kidney removed back in the '60's or '70's""  . Headache    "monthly; need glasses; comes on when I'm stressed or get too tired" (03/16/2015)  . Hepatitis C   . History of stomach ulcers   . Hypertension   . Kidney stone   . MRSA carrier 07/09/2017  . On home O2    "~ 3L once/wk and prn" (03/16/2015)  . Pain management   . Pneumonia    "@ least once/yr for the past 8 yrs" (03/16/2015)  . Spinal stenosis   . Tobacco use     SOCIAL HX:  Social History   Tobacco Use  . Smoking status: Former Smoker    Packs/day: 0.25    Years: 36.00    Pack years: 9.00    Types: Cigarettes  . Smokeless tobacco: Never Used  . Tobacco comment: 1/2 pack per day 12/25/18.   Substance Use Topics  . Alcohol use: No    Comment: denies h/o alcohol abuse on 03/16/2015    ALLERGIES:  Allergies  Allergen Reactions  . Aspirin     Upset stomach     PERTINENT MEDICATIONS:  Outpatient Encounter Medications as of 12/29/2018  Medication Sig  . albuterol (PROVENTIL HFA;VENTOLIN HFA) 108 (90 Base) MCG/ACT inhaler Inhale 2 puffs into the lungs every 6 (six) hours as needed for wheezing or shortness of breath.  Marland Kitchen albuterol (PROVENTIL) (2.5 MG/3ML) 0.083% nebulizer solution Take 3 mLs (2.5 mg total) by nebulization every 6 (six) hours as needed for wheezing or shortness of breath (buy this only if you cannot afford the Duoneb).  Marland Kitchen albuterol (PROVENTIL) (2.5 MG/3ML) 0.083% nebulizer solution Take 3 mLs (2.5 mg total) by nebulization every 4 (four) hours as needed for wheezing or shortness of breath.  Marland Kitchen amLODipine (NORVASC) 5 MG  tablet Take 1 tablet (5 mg total) by mouth daily.  . budesonide (PULMICORT) 0.5 MG/2ML nebulizer solution Take 2 mLs (0.5 mg total) by nebulization 2 (two) times daily for 30 days.  . DULoxetine (CYMBALTA) 30 MG capsule Take 30 mg by mouth at bedtime.   . DULoxetine (CYMBALTA) 60 MG capsule Take 60 mg by mouth daily.   . feeding supplement, ENSURE ENLIVE, (ENSURE ENLIVE) LIQD Take 237 mLs by mouth 3 (three) times daily between meals.  . formoterol (PERFOROMIST) 20 MCG/2ML nebulizer solution Take 2 mLs (20 mcg total) by nebulization 2 (two) times daily for  30 days.  Marland Kitchen gabapentin (NEURONTIN) 800 MG tablet Take 800 mg by mouth 4 (four) times daily.   Marland Kitchen ibuprofen (ADVIL,MOTRIN) 200 MG tablet Take 800 mg by mouth every 6 (six) hours as needed for moderate pain.   . Lactobacillus-Inulin ([place]) CAPS Take by mouth daily.  Marland Kitchen LORazepam (ATIVAN) 0.5 MG tablet Take 0.5 mg by mouth 4 (four) times daily.   Marland Kitchen LORazepam (ATIVAN) 1 MG tablet Take 1 mg by mouth every 4 (four) hours as needed for anxiety.   . Nebulizers (NEBULIZER COMPRESSOR) MISC Use as directed  . potassium chloride (K-DUR,KLOR-CON) 10 MEQ tablet Take 10 mEq by mouth daily.  . predniSONE (DELTASONE) 10 MG tablet Take 10 mg by mouth daily with breakfast.   . Revefenacin (YUPELRI) 175 MCG/3ML SOLN Inhale 3 mLs into the lungs daily for 30 days.  Marland Kitchen sertraline (ZOLOFT) 100 MG tablet Take 100 mg by mouth daily.   Marland Kitchen tiotropium (SPIRIVA HANDIHALER) 18 MCG inhalation capsule Place 1 capsule (18 mcg total) into inhaler and inhale daily.  Marland Kitchen tiZANidine (ZANAFLEX) 4 MG tablet Take 4 mg by mouth 2 (two) times daily as needed for muscle spasms.   . varenicline (CHANTIX PAK) 0.5 MG X 11 & 1 MG X 42 tablet Take one 0.5 mg tablet by mouth once daily for 3 days, then increase to one 0.5 mg tablet twice daily for 4 days, then increase to one 1 mg tablet twice daily.   No facility-administered encounter medications on file as of 12/29/2018.        Channa Hazelett Jenetta Downer, NP

## 2018-12-29 NOTE — Telephone Encounter (Signed)
PA for Chantix initiated. Key: [id number]. Will await a decision and update chart once decision is made.

## 2018-12-30 ENCOUNTER — Telehealth: Payer: Self-pay | Admitting: Adult Health Nurse Practitioner

## 2018-12-30 NOTE — Telephone Encounter (Signed)
Attempted to call pt but unable to reach. Left message for pt to return call. 

## 2018-12-30 NOTE — Telephone Encounter (Signed)
Called patient's daughter as was unable to reach her yesterday after my visit.  Updated her on patient's status and recommendations.  Left contact number if she had any questions or concerns. Nathian Stencil K. Olena Heckle NP

## 2018-12-31 ENCOUNTER — Telehealth: Payer: Self-pay | Admitting: Pulmonary Disease

## 2018-12-31 NOTE — Telephone Encounter (Signed)
Returned call to [employer].  She stated PA was needed for Patient to receive Chantix. CMM PA for Chantix received. Medication name and strength: Chantix 0.5mg  Provider: Dr Valeta Harms Patient insurance ID: [id number] Phone:  [phone] Was the PA started on CMM?  Yes If yes, please enter the Key: [facility] Timeframe for approval/denial:Approved Per CMM- more info needed.  Spoke with Tim,CMM.  Tim stated Chantix is covered and did not require PA.    Per CMM- ? Information regarding your request   The patient currently has access to the requested medication and a Prior Authorization is not needed for the patient/medication.  [place], informed her of Chantix not requiring a PA.  Suanne Marker was going to contact facility pharmacy. Nothing further at this time.

## 2018-12-31 NOTE — Telephone Encounter (Signed)
Closing per protocol 

## 2019-01-02 MED ORDER — VARENICLINE TARTRATE 0.5 MG X 11 & 1 MG X 42 PO MISC: ORAL | 0 refills | Status: DC

## 2019-01-02 NOTE — Telephone Encounter (Signed)
Checked covermymeds and pt's chantix has received an approved status.  Called pt's pharmacy and spoke with Cheri Rous letting him know the PA was approved and while speaking with him,he stated they did not have a Chantix Rx on file. After looking into it, saw that the Rx had printed. Stated to [facility] that I would fix and resend Rx to pharmacy for pt.   Attempted to call pt/pt's daughter Joellen Jersey but unable to reach. Left a detailed message letting them know that they should be able to pick chantix Rx up from pharmacy. Nothing further needed.

## 2019-01-20 ENCOUNTER — Encounter: Payer: Self-pay | Admitting: Pulmonary Disease

## 2019-03-04 ENCOUNTER — Telehealth: Payer: Self-pay

## 2019-03-04 NOTE — Telephone Encounter (Signed)
Received message to call patient as she has d/c from [facility]. Phone call placed, VM full.

## 2019-03-06 ENCOUNTER — Telehealth: Payer: Self-pay

## 2019-03-06 NOTE — Telephone Encounter (Signed)
Received message to call patient. Spoke with patient who shared that she had lost her medication but has since found it. Offered to schedule visit with NP. Scheduled for Tuesday 03/10/2019

## 2019-03-10 ENCOUNTER — Other Ambulatory Visit: Payer: Medicare Other | Admitting: Adult Health Nurse Practitioner

## 2019-03-10 ENCOUNTER — Other Ambulatory Visit: Payer: Self-pay

## 2019-03-10 DIAGNOSIS — Z515 Encounter for palliative care: Secondary | ICD-10-CM

## 2019-03-10 NOTE — Progress Notes (Signed)
[facility] Consult Note Telephone: [phone]  Fax: [phone]  PATIENT NAME: Emily Moran DOB: 1955/11/28 MRN: [id number]  PRIMARY CARE PROVIDER:   Nolene Ebbs, MD  REFERRING PROVIDER:  June Leap MD (pulmonologist)  RESPONSIBLE PARTY:  Angelis Gates, daughter  [phone]   Due to the COVID-19 crisis, this visit was done via telemedicine and it was initiated and consent by this patient and or family. Video-audio (telehealth) contact was unable to be done due to technical barriers from the patient's side.     RECOMMENDATIONS and PLAN:  1.  End stage COPD. Does not state any increased DOE though she does have DOE at baseline.  She uses O2 @ 3L continuous.  She is being seen by pulmonology, Dr. Valeta Harms.  She does state that she has significantly decreased how much she smokes because when she smokes she states having chest pain.  Have encouraged her to keep up the good work and to continue decreasing how much she smokes.    2. Pain.  patient did not go into her pain today, so unsure if she is still going to pain clinic.  Patient has moved out of [place] and is now living in [place] with her daughter.  This may create a barrier in terms of transportation to the pain clinic she was going to.  She did state that she only had a few days left of her meds but that she had refills.  She thought palliative was going to take over this but have encouraged her to reach out to her previous PCP, Dr. Jeanie Cooks, and gave her his phone number.  She states that she will call him to set up an appointment to reestablish care.   3.  Appetite.  States that even though she doesn't have much of an appetite that she does eat 3 meals a day.  Reports no weight loss.  4. Anxiety/Depression.  Patient does state having a hard time lately as her son passed away at the end of 02-08-2023.  States that overall she is doing better but some days finds it hard to get  out of bed.  Once she gets reestablished with PCP, her depression and anxiety meds may need to be adjusted.  5. Goals of Care.  DNR in place. Patient wants to have a face to face visit and scheduled one for 03/16/2019 at 1pm.  I spent 30 minutes providing this consultation,  from 1:00 to 1:30. More than 50% of the time in this consultation was spent coordinating communication.   HISTORY OF PRESENT ILLNESS:  Emily Moran is a 63 y.o. year old female with multiple medical problems including end stage COPD, HTN, spinal stenosis, DDD, chronic back pain, depression, anxiety. Palliative Care was asked to help address goals of care.   CODE STATUS: DNR  PPS: 60% HOSPICE ELIGIBILITY/DIAGNOSIS: TBD  PAST MEDICAL HISTORY:  Past Medical History:  Diagnosis Date  . Anemia    "as a child"  . Anxiety   . Arthritis    "shoulders; back" (03/16/2015)  . Chronic back pain   . Chronic bronchitis ([place])    "I basically keep it" (03/16/2015)  . Chronic pain   . COPD (chronic obstructive pulmonary disease) ([place])   . DDD (degenerative disc disease), lumbar   . Degenerative disc disease   . Depression   . Ectatic aorta ([place]) 07/08/2017  . Family history of adverse reaction to anesthesia    "my mother had  an allergic reaction when she had kidney removed back in the '60's or '70's""  . Headache    "monthly; need glasses; comes on when I'm stressed or get too tired" (03/16/2015)  . Hepatitis C   . History of stomach ulcers   . Hypertension   . Kidney stone   . MRSA carrier 07/09/2017  . On home O2    "~ 3L once/wk and prn" (03/16/2015)  . Pain management   . Pneumonia    "@ least once/yr for the past 8 yrs" (03/16/2015)  . Spinal stenosis   . Tobacco use     SOCIAL HX:  Social History   Tobacco Use  . Smoking status: Former Smoker    Packs/day: 0.25    Years: 36.00    Pack years: 9.00    Types: Cigarettes  . Smokeless tobacco: Never Used  . Tobacco comment: 1/2 pack per day 12/25/18.   Substance  Use Topics  . Alcohol use: No    Comment: denies h/o alcohol abuse on 03/16/2015    ALLERGIES:  Allergies  Allergen Reactions  . Aspirin     Upset stomach     PERTINENT MEDICATIONS:  Outpatient Encounter Medications as of 03/10/2019  Medication Sig  . albuterol (PROVENTIL HFA;VENTOLIN HFA) 108 (90 Base) MCG/ACT inhaler Inhale 2 puffs into the lungs every 6 (six) hours as needed for wheezing or shortness of breath.  Marland Kitchen albuterol (PROVENTIL) (2.5 MG/3ML) 0.083% nebulizer solution Take 3 mLs (2.5 mg total) by nebulization every 6 (six) hours as needed for wheezing or shortness of breath (buy this only if you cannot afford the Duoneb).  Marland Kitchen albuterol (PROVENTIL) (2.5 MG/3ML) 0.083% nebulizer solution Take 3 mLs (2.5 mg total) by nebulization every 4 (four) hours as needed for wheezing or shortness of breath.  Marland Kitchen amLODipine (NORVASC) 5 MG tablet Take 1 tablet (5 mg total) by mouth daily.  . budesonide (PULMICORT) 0.5 MG/2ML nebulizer solution Take 2 mLs (0.5 mg total) by nebulization 2 (two) times daily for 30 days.  . DULoxetine (CYMBALTA) 30 MG capsule Take 30 mg by mouth at bedtime.   . DULoxetine (CYMBALTA) 60 MG capsule Take 60 mg by mouth daily.   . feeding supplement, ENSURE ENLIVE, (ENSURE ENLIVE) LIQD Take 237 mLs by mouth 3 (three) times daily between meals.  . formoterol (PERFOROMIST) 20 MCG/2ML nebulizer solution Take 2 mLs (20 mcg total) by nebulization 2 (two) times daily for 30 days.  Marland Kitchen gabapentin (NEURONTIN) 800 MG tablet Take 800 mg by mouth 4 (four) times daily.   Marland Kitchen ibuprofen (ADVIL,MOTRIN) 200 MG tablet Take 800 mg by mouth every 6 (six) hours as needed for moderate pain.   . Lactobacillus-Inulin ([place]) CAPS Take by mouth daily.  Marland Kitchen LORazepam (ATIVAN) 0.5 MG tablet Take 0.5 mg by mouth 4 (four) times daily.   Marland Kitchen LORazepam (ATIVAN) 1 MG tablet Take 1 mg by mouth every 4 (four) hours as needed for anxiety.   . Nebulizers (NEBULIZER COMPRESSOR) MISC Use as directed   . potassium chloride (K-DUR,KLOR-CON) 10 MEQ tablet Take 10 mEq by mouth daily.  . predniSONE (DELTASONE) 10 MG tablet Take 10 mg by mouth daily with breakfast.   . sertraline (ZOLOFT) 100 MG tablet Take 100 mg by mouth daily.   Marland Kitchen tiotropium (SPIRIVA HANDIHALER) 18 MCG inhalation capsule Place 1 capsule (18 mcg total) into inhaler and inhale daily.  Marland Kitchen tiZANidine (ZANAFLEX) 4 MG tablet Take 4 mg by mouth 2 (two) times daily as needed for  muscle spasms.   . varenicline (CHANTIX PAK) 0.5 MG X 11 & 1 MG X 42 tablet Take one 0.5mg  tab once daily x3 days, then increase to one 0.5mg  tab twice daily x 4 days, then increase to one 1mg  tab twice daily.   No facility-administered encounter medications on file as of 03/10/2019.       Brooklin Rieger Jenetta Downer, NP

## 2019-03-16 ENCOUNTER — Telehealth: Payer: Self-pay | Admitting: Adult Health Nurse Practitioner

## 2019-03-16 ENCOUNTER — Other Ambulatory Visit: Payer: Medicare Other | Admitting: Adult Health Nurse Practitioner

## 2019-03-16 ENCOUNTER — Other Ambulatory Visit: Payer: Self-pay

## 2019-03-16 DIAGNOSIS — Z515 Encounter for palliative care: Secondary | ICD-10-CM

## 2019-03-16 NOTE — Telephone Encounter (Signed)
COVID screening negative.  Confirmed appt.for this afternoon Emily Moran K. Olena Heckle NP

## 2019-03-16 NOTE — Progress Notes (Signed)
Emily Moran Consult Note Telephone: [phone]  Fax: [phone]  PATIENT NAME: Emily Moran DOB: 02/03/1956 MRN: [id number]  PRIMARY CARE PROVIDER:   Nolene Ebbs, MD  REFERRING PROVIDER: June Leap MD (pulmonologist)  RESPONSIBLE PARTY:   Megin Consalvo, daughter [phone]    RECOMMENDATIONS and PLAN:  1. End stage COPD.She uses O2 @ 3L continuous.  States that she is having increased DOE especially if she is carrying anything.  States that it is getting harder to move her O2 tank around with her.  States that she is having a tight, cramp like pain in her chest.  States that it is worse in her right lung.  Feels as if her COPD is worsening.  States that it helps when she does her breathing treatments. States that she tries to do too much at one time and does verbalize understanding that she needs take any activity slowly with frequent breaks. States that she was supposed to have appointment with Dr. Valeta Harms on June 2, but this was cancelled due to [place].  States that she is going to call and set up an appointment.  2. Pain. Patient states that she continues to go to Methadone clinic daily for her treatment.  States that it is getting harder for her to go as she gets worn out and tired very easily.  States that when she gets back she takes a nap.  Does feel as if the methadone is working as she states that when she does go a day without it she can hardly move due to the pain.  She has called Dr. Santiago Bur office to reestablish care but has not heard back yet.  States that her daughter has tried calling as well.States that the office is not far from the Methadone clinic and that she will try to stop by the office to set up an appointment.  As she gets weaker she may need pain management that she does not have to daily go into the clinic for.  Does state that her insurance will not pay for methadone unless she gets it in house at the clinic,  so she will have to be transitioned to something else that insurance will pay for.  3. Appetite. States that even though she doesn't have much of an appetite that she does eat 3 meals a day.  Reports that her last known weight is 114 pounds.  4. Anxiety/Depression. Patient does state having a hard time lately as her son passed away at the end of 2023/01/28.  States that her ativan has run out and she does not have any refills on this.  Have asked her that when she calls Dr. Juline Patch office if they will refill for it her at least temporarily until she gets established with PCP.    5. Goals of Care. DNR in place.  Main goal for patient right now is to establish a PCP to help manage her care and her meds.    I spent 60 minutes providing this consultation,  from 1:00 to 2:00. More than 50% of the time in this consultation was spent coordinating communication.   HISTORY OF PRESENT ILLNESS:  Emily Moran is a 63 y.o. year old female with multiple medical problems including end stage COPD, HTN, spinal stenosis, DDD, chronic back pain, depression, anxiety. Palliative Care was asked to help address goals of care.   CODE STATUS: DNR  PPS: 60% HOSPICE ELIGIBILITY/DIAGNOSIS: TBD  PHYSICAL EXAM:   General:  NAD, frail appearing, thin Extremities: no edema, no joint deformities Skin: no rashes Neurological: Weakness but otherwise nonfocal  PAST MEDICAL HISTORY:  Past Medical History:  Diagnosis Date   Anemia    "as a child"   Anxiety    Arthritis    "shoulders; back" (03/16/2015)   Chronic back pain    Chronic bronchitis ([place])    "I basically keep it" (03/16/2015)   Chronic pain    COPD (chronic obstructive pulmonary disease) ([facility])    DDD (degenerative disc disease), lumbar    Degenerative disc disease    Depression    Ectatic aorta ([place]) 07/08/2017   Family history of adverse reaction to anesthesia    "my mother had an allergic reaction when she had kidney removed back in the  '60's or '70's""   Headache    "monthly; need glasses; comes on when I'm stressed or get too tired" (03/16/2015)   Hepatitis C    History of stomach ulcers    Hypertension    Kidney stone    MRSA carrier 07/09/2017   On home O2    "~ 3L once/wk and prn" (03/16/2015)   Pain management    Pneumonia    "@ least once/yr for the past 8 yrs" (03/16/2015)   Spinal stenosis    Tobacco use     SOCIAL HX:  Social History   Tobacco Use   Smoking status: Former Smoker    Packs/day: 0.25    Years: 36.00    Pack years: 9.00    Types: Cigarettes   Smokeless tobacco: Never Used   Tobacco comment: 1/2 pack per day 12/25/18.   Substance Use Topics   Alcohol use: No    Comment: denies h/o alcohol abuse on 03/16/2015    ALLERGIES:  Allergies  Allergen Reactions   Aspirin     Upset stomach     PERTINENT MEDICATIONS:  Outpatient Encounter Medications as of 03/16/2019  Medication Sig   albuterol (PROVENTIL HFA;VENTOLIN HFA) 108 (90 Base) MCG/ACT inhaler Inhale 2 puffs into the lungs every 6 (six) hours as needed for wheezing or shortness of breath.   albuterol (PROVENTIL) (2.5 MG/3ML) 0.083% nebulizer solution Take 3 mLs (2.5 mg total) by nebulization every 6 (six) hours as needed for wheezing or shortness of breath (buy this only if you cannot afford the Duoneb).   albuterol (PROVENTIL) (2.5 MG/3ML) 0.083% nebulizer solution Take 3 mLs (2.5 mg total) by nebulization every 4 (four) hours as needed for wheezing or shortness of breath.   amLODipine (NORVASC) 5 MG tablet Take 1 tablet (5 mg total) by mouth daily.   budesonide (PULMICORT) 0.5 MG/2ML nebulizer solution Take 2 mLs (0.5 mg total) by nebulization 2 (two) times daily for 30 days.   DULoxetine (CYMBALTA) 30 MG capsule Take 30 mg by mouth at bedtime.    DULoxetine (CYMBALTA) 60 MG capsule Take 60 mg by mouth daily.    feeding supplement, ENSURE ENLIVE, (ENSURE ENLIVE) LIQD Take 237 mLs by mouth 3 (three) times daily  between meals.   formoterol (PERFOROMIST) 20 MCG/2ML nebulizer solution Take 2 mLs (20 mcg total) by nebulization 2 (two) times daily for 30 days.   gabapentin (NEURONTIN) 800 MG tablet Take 800 mg by mouth 4 (four) times daily.    ibuprofen (ADVIL,MOTRIN) 200 MG tablet Take 800 mg by mouth every 6 (six) hours as needed for moderate pain.    Lactobacillus-Inulin ([place]) CAPS Take by mouth daily.   LORazepam (ATIVAN) 0.5 MG tablet  Take 0.5 mg by mouth 4 (four) times daily.    LORazepam (ATIVAN) 1 MG tablet Take 1 mg by mouth every 4 (four) hours as needed for anxiety.    Nebulizers (NEBULIZER COMPRESSOR) MISC Use as directed   potassium chloride (K-DUR,KLOR-CON) 10 MEQ tablet Take 10 mEq by mouth daily.   predniSONE (DELTASONE) 10 MG tablet Take 10 mg by mouth daily with breakfast.    sertraline (ZOLOFT) 100 MG tablet Take 100 mg by mouth daily.    tiotropium (SPIRIVA HANDIHALER) 18 MCG inhalation capsule Place 1 capsule (18 mcg total) into inhaler and inhale daily.   tiZANidine (ZANAFLEX) 4 MG tablet Take 4 mg by mouth 2 (two) times daily as needed for muscle spasms.    varenicline (CHANTIX PAK) 0.5 MG X 11 & 1 MG X 42 tablet Take one 0.5mg  tab once daily x3 days, then increase to one 0.5mg  tab twice daily x 4 days, then increase to one 1mg  tab twice daily.   No facility-administered encounter medications on file as of 03/16/2019.      Rilea Arutyunyan Jenetta Downer, NP

## 2019-03-16 NOTE — Telephone Encounter (Signed)
Called patient for COVID screening prior to visit this afternoon.  No answer and unable to leave message as VM box was full Merrit Friesen K. Olena Heckle NP

## 2019-03-18 ENCOUNTER — Telehealth: Payer: Self-pay | Admitting: Pulmonary Disease

## 2019-03-18 NOTE — Telephone Encounter (Signed)
We.. our practice ... have not been filling this prescription for her. I have never seen her and therefore cannot fill a controlled substance for her. She needs to reach out to whomever has been filling this for her , or establish with a PCP or hospice. Thanks

## 2019-03-18 NOTE — Telephone Encounter (Signed)
Called the patient and advised her of the response received. Patient voiced understanding. Nothing further needed at this time.

## 2019-03-18 NOTE — Telephone Encounter (Signed)
Pt requesting refill of Ativan. Pt not established with pcp per palliative care note.   [place] Consult Note Telephone: [phone]  Fax: [phone]  PATIENT NAME: Emily Moran DOB: 10/21/1955 MRN: [id number]  PRIMARY CARE PROVIDER:   Nolene Ebbs, MD  REFERRING PROVIDER: June Leap MD (pulmonologist)  RESPONSIBLE PARTY:   Everlean Bucher, daughter [phone]    RECOMMENDATIONS and PLAN:  1. End stage COPD.She uses O2 @ 3L continuous. States that she is having increased DOE especially if she is carrying anything.  States that it is getting harder to move her O2 tank around with her.  States that she is having a tight, cramp like pain in her chest.  States that it is worse in her right lung.  Feels as if her COPD is worsening.  States that it helps when she does her breathing treatments. States that she tries to do too much at one time and does verbalize understanding that she needs take any activity slowly with frequent breaks. States that she was supposed to have appointment with Dr. Valeta Harms on June 2, but this was cancelled due to [place].  States that she is going to call and set up an appointment.  2. Pain.Patient states that she continues to go to Methadone clinic daily for her treatment.  States that it is getting harder for her to go as she gets worn out and tired very easily.  States that when she gets back she takes a nap.  Does feel as if the methadone is working as she states that when she does go a day without it she can hardly move due to the pain.  She has called Dr. Santiago Bur office to reestablish care but has not heard back yet.  States that her daughter has tried calling as well.States that the office is not far from the Methadone clinic and that she will try to stop by the office to set up an appointment.  As she gets weaker she may need pain management that she does not have to daily go into the clinic for.  Does  state that her insurance will not pay for methadone unless she gets it in house at the clinic, so she will have to be transitioned to something else that insurance will pay for.  3. Appetite.States that even though she doesn't have much of an appetite that she does eat 3 meals a day. Reports that her last known weight is 114 pounds.  4. Anxiety/Depression.Patient does state having a hard time lately as her son passed away at the end of 2023/01/23. States that her ativan has run out and she does not have any refills on this.  Have asked her that when she calls Dr. Juline Patch office if they will refill for it her at least temporarily until she gets established with PCP.    5. Goals of Care. DNR in place.  Main goal for patient right now is to establish a PCP to help manage her care and her meds.    I spent 60 minutes providing this consultation,  from 1:00 to 2:00. More than 50% of the time in this consultation was spent coordinating communication.   HISTORY OF PRESENT ILLNESS:  Emily Moran is a 63 y.o. year old female with multiple medical problems including end stage COPD, HTN, spinal stenosis, DDD, chronic back pain, depression, anxiety. Palliative Care was asked to help address goals of care.

## 2019-03-25 ENCOUNTER — Other Ambulatory Visit: Payer: Self-pay | Admitting: Internal Medicine

## 2019-03-25 DIAGNOSIS — E2839 Other primary ovarian failure: Secondary | ICD-10-CM

## 2019-04-01 ENCOUNTER — Telehealth: Payer: Self-pay | Admitting: Adult Health Nurse Practitioner

## 2019-04-01 NOTE — Telephone Encounter (Signed)
Received a message from the office that patient's sister had left a written message on the office door about patient concerns.  Patient having increased anxiety and needing a light weight O2 tank.  Tried calling patient's daughter and could not leave a message due to VM box being full.  No answer on the other number given in Epic.  Will reach out to her PCP with these concerns. Elizzie Westergard K. Olena Heckle NP

## 2019-04-02 ENCOUNTER — Ambulatory Visit: Payer: Medicare Other | Admitting: Pulmonary Disease

## 2019-04-08 ENCOUNTER — Other Ambulatory Visit: Payer: Self-pay

## 2019-04-08 ENCOUNTER — Other Ambulatory Visit: Payer: Medicare Other | Admitting: Adult Health Nurse Practitioner

## 2019-04-08 DIAGNOSIS — Z515 Encounter for palliative care: Secondary | ICD-10-CM

## 2019-04-08 NOTE — Progress Notes (Signed)
[facility] Consult Note Telephone: [phone]  Fax: [phone]  PATIENT NAME: Emily Moran DOB: 19-Oct-1955 MRN: [id number]  PRIMARY CARE PROVIDER:   Nolene Ebbs, MD  REFERRING PROVIDER: June Leap MD (pulmonologist)  RESPONSIBLE PARTY:  Self and Marceil Welp, daughter [phone]    Due to the COVID-19 crisis, this visit was done via telemedicine and it was initiated and consent by this patient and or family. Video-audio (telehealth) contact was unable to be done due to technical barriers from the patient's side.     RECOMMENDATIONS and PLAN:  1. End stage COPD.  Patient is O2 dependent at 3L continuous.  She is having a lot of difficulty with increased SOB and tiredness from having to pull around her O2 tank. She states that her tanks now only last for about 2 hours and that limits how long she has to go out and take care of errands. Is requesting a smaller lighter tank.  States that she was told at [place] office that she had to call the [facility] and they are saying she has to call the provider.  I called [facility] and they stated needing a faxed order from a provider.  I do not have ability to fax from home so left VM at PCP's office to see if they could do this.    2.  Anxiety/Depression.  Patient was cut back on her ativan about a week ago.  She states that she now get Ativan 0.5 mg BID and that she is having withdrawal symptoms.  When asked what she meant by withdrawal symptoms, she stated having sweats, nausea, diarrhea, and flu-like symptoms.  She also states that her depression is worse and that she doesn't think that her zoloft is working anymore as she has been taking it for many years.  States that she is having problems sleeping.  Some nights she doesn't sleep at all and other times she sleeps all day.  States that she cries all the time and has had decreased appetite.  Denies SI.  States that she has an  appointment with PCP in about two and a half months and does not know if she can wait that long to have her depression and anxiety meds adjusted.  In VM at PCP's office did ask to reach out to patient to set up appointment sooner to address depression and anxiety.    3.  Dental issues.  Patient states that previous dental work had broke and was causing pain. Did not know of any dentist in her area to suggest to her.  Suggested she call some local dentists to see who would take her insurance before going in.  She did suggest that is what she would do and try to do it soon because of the pain.  I spent 30 minutes providing this consultation,  from 11:00 to 11:30. More than 50% of the time in this consultation was spent coordinating communication.   HISTORY OF PRESENT ILLNESS:  DIVINA NEALE is a 63 y.o. year old female with multiple medical problems including end stage COPD, HTN, spinal stenosis, DDD, chronic back pain, depression, anxiety. Palliative Care was asked to help address goals of care.   CODE STATUS: DNR  PPS: 60% HOSPICE ELIGIBILITY/DIAGNOSIS: TBD  PAST MEDICAL HISTORY:  Past Medical History:  Diagnosis Date  . Anemia    "as a child"  . Anxiety   . Arthritis    "shoulders; back" (03/16/2015)  . Chronic  back pain   . Chronic bronchitis ([place])    "I basically keep it" (03/16/2015)  . Chronic pain   . COPD (chronic obstructive pulmonary disease) ([place])   . DDD (degenerative disc disease), lumbar   . Degenerative disc disease   . Depression   . Ectatic aorta ([place]) 07/08/2017  . Family history of adverse reaction to anesthesia    "my mother had an allergic reaction when she had kidney removed back in the '60's or '70's""  . Headache    "monthly; need glasses; comes on when I'm stressed or get too tired" (03/16/2015)  . Hepatitis C   . History of stomach ulcers   . Hypertension   . Kidney stone   . MRSA carrier 07/09/2017  . On home O2    "~ 3L once/wk and prn" (03/16/2015)  . Pain  management   . Pneumonia    "@ least once/yr for the past 8 yrs" (03/16/2015)  . Spinal stenosis   . Tobacco use     SOCIAL HX:  Social History   Tobacco Use  . Smoking status: Former Smoker    Packs/day: 0.25    Years: 36.00    Pack years: 9.00    Types: Cigarettes  . Smokeless tobacco: Never Used  . Tobacco comment: 1/2 pack per day 12/25/18.   Substance Use Topics  . Alcohol use: No    Comment: denies h/o alcohol abuse on 03/16/2015    ALLERGIES:  Allergies  Allergen Reactions  . Aspirin     Upset stomach     PERTINENT MEDICATIONS:  Outpatient Encounter Medications as of 04/08/2019  Medication Sig  . albuterol (PROVENTIL HFA;VENTOLIN HFA) 108 (90 Base) MCG/ACT inhaler Inhale 2 puffs into the lungs every 6 (six) hours as needed for wheezing or shortness of breath.  Marland Kitchen albuterol (PROVENTIL) (2.5 MG/3ML) 0.083% nebulizer solution Take 3 mLs (2.5 mg total) by nebulization every 6 (six) hours as needed for wheezing or shortness of breath (buy this only if you cannot afford the Duoneb).  Marland Kitchen albuterol (PROVENTIL) (2.5 MG/3ML) 0.083% nebulizer solution Take 3 mLs (2.5 mg total) by nebulization every 4 (four) hours as needed for wheezing or shortness of breath.  Marland Kitchen amLODipine (NORVASC) 5 MG tablet Take 1 tablet (5 mg total) by mouth daily.  . budesonide (PULMICORT) 0.5 MG/2ML nebulizer solution Take 2 mLs (0.5 mg total) by nebulization 2 (two) times daily for 30 days.  . DULoxetine (CYMBALTA) 30 MG capsule Take 30 mg by mouth at bedtime.   . DULoxetine (CYMBALTA) 60 MG capsule Take 60 mg by mouth daily.   . feeding supplement, ENSURE ENLIVE, (ENSURE ENLIVE) LIQD Take 237 mLs by mouth 3 (three) times daily between meals.  . formoterol (PERFOROMIST) 20 MCG/2ML nebulizer solution Take 2 mLs (20 mcg total) by nebulization 2 (two) times daily for 30 days.  Marland Kitchen gabapentin (NEURONTIN) 800 MG tablet Take 800 mg by mouth 4 (four) times daily.   Marland Kitchen ibuprofen (ADVIL,MOTRIN) 200 MG tablet Take 800 mg by  mouth every 6 (six) hours as needed for moderate pain.   . Lactobacillus-Inulin ([place]) CAPS Take by mouth daily.  Marland Kitchen LORazepam (ATIVAN) 0.5 MG tablet Take 0.5 mg by mouth 4 (four) times daily.   Marland Kitchen LORazepam (ATIVAN) 1 MG tablet Take 1 mg by mouth every 4 (four) hours as needed for anxiety.   . Nebulizers (NEBULIZER COMPRESSOR) MISC Use as directed  . potassium chloride (K-DUR,KLOR-CON) 10 MEQ tablet Take 10 mEq by mouth daily.  Marland Kitchen  predniSONE (DELTASONE) 10 MG tablet Take 10 mg by mouth daily with breakfast.   . sertraline (ZOLOFT) 100 MG tablet Take 100 mg by mouth daily.   Marland Kitchen tiotropium (SPIRIVA HANDIHALER) 18 MCG inhalation capsule Place 1 capsule (18 mcg total) into inhaler and inhale daily.  Marland Kitchen tiZANidine (ZANAFLEX) 4 MG tablet Take 4 mg by mouth 2 (two) times daily as needed for muscle spasms.   . varenicline (CHANTIX PAK) 0.5 MG X 11 & 1 MG X 42 tablet Take one 0.5mg  tab once daily x3 days, then increase to one 0.5mg  tab twice daily x 4 days, then increase to one 1mg  tab twice daily.   No facility-administered encounter medications on file as of 04/08/2019.        Avry Roedl Jenetta Downer, NP

## 2019-04-09 ENCOUNTER — Telehealth: Payer: Self-pay

## 2019-04-09 NOTE — Telephone Encounter (Signed)
-----   Message from Garner Nash, DO sent at 04/09/2019 10:27 AM EDT ----- Thanks for the update.  We can try to get her a smaller tank. But the smaller the O2 tank the less time she will have. They may only last 73mins. So I am not sure how useful they will be to her.  Take care Leory Plowman   CC: [place] ----- Message ----- From: Berneta Sages, NP Sent: 04/08/2019   2:27 PM EDT To: Bradley L Icard, DO  Amy K. Olena Heckle NP Palliative Care with [facility] [phone]

## 2019-04-09 NOTE — Telephone Encounter (Signed)
Call made to patient, she is requesting to be qualified for a POC. Made patient aware she will need to come to the office to be qualified. She also reports having increased SOB and wheezing despite using her inhalers and nebulizer's. Made patient aware this all can be addressed during her OV. She requested an appt next week. Appt made. Covid screen negative. While on phone patient reports she thinks she is going through withdrawal from having her xanax decreased. When asked what withdrawal symptoms she was having she was unable to explain stating you know how that feels. She states the assisted living facility she was in decreased the dose. I informed this patient that this was her pulmonary physician and she would need to discuss that with her PCP. Voiced understanding. Nothing further is needed at this time.

## 2019-04-13 ENCOUNTER — Encounter: Payer: Self-pay | Admitting: Primary Care

## 2019-04-13 ENCOUNTER — Ambulatory Visit (INDEPENDENT_AMBULATORY_CARE_PROVIDER_SITE_OTHER): Payer: Medicare Other | Admitting: Primary Care

## 2019-04-13 ENCOUNTER — Ambulatory Visit: Payer: Medicare Other | Admitting: Primary Care

## 2019-04-13 ENCOUNTER — Telehealth: Payer: Self-pay | Admitting: Primary Care

## 2019-04-13 ENCOUNTER — Other Ambulatory Visit: Payer: Self-pay

## 2019-04-13 VITALS — BP 100/70 | HR 60 | Temp 98.0°F | Ht 66.0 in | Wt 118.8 lb

## 2019-04-13 DIAGNOSIS — J441 Chronic obstructive pulmonary disease with (acute) exacerbation: Secondary | ICD-10-CM

## 2019-04-13 DIAGNOSIS — J9611 Chronic respiratory failure with hypoxia: Secondary | ICD-10-CM

## 2019-04-13 DIAGNOSIS — J9601 Acute respiratory failure with hypoxia: Secondary | ICD-10-CM

## 2019-04-13 DIAGNOSIS — F17200 Nicotine dependence, unspecified, uncomplicated: Secondary | ICD-10-CM

## 2019-04-13 DIAGNOSIS — R5381 Other malaise: Secondary | ICD-10-CM

## 2019-04-13 DIAGNOSIS — F4321 Adjustment disorder with depressed mood: Secondary | ICD-10-CM

## 2019-04-13 MED ORDER — DOXYCYCLINE HYCLATE 100 MG PO TABS: 100.0000 mg | ORAL_TABLET | Freq: Two times a day (BID) | ORAL | 0 refills | Status: DC

## 2019-04-13 MED ORDER — BUDESONIDE 0.5 MG/2ML IN SUSP: 0.5000 mg | Freq: Two times a day (BID) | RESPIRATORY_TRACT | 11 refills | Status: DC

## 2019-04-13 MED ORDER — PERFOROMIST 20 MCG/2ML IN NEBU: 20.0000 ug | INHALATION_SOLUTION | Freq: Two times a day (BID) | RESPIRATORY_TRACT | 11 refills | Status: DC

## 2019-04-13 MED ORDER — YUPELRI 175 MCG/3ML IN SOLN: 1.0000 | Freq: Every day | RESPIRATORY_TRACT | 6 refills | Status: DC

## 2019-04-13 NOTE — Progress Notes (Signed)
@Patient  ID: Cristina Gong, female    DOB: [birth year], 63 y.o.   MRN: [id number]  No chief complaint on file.   Referring provider: Nolene Ebbs, MD  HPI: 63 year old, former smoker (smoked for 50 years). PMH HTN, severe COPD, chronic hypoxemic respiratory failure on oxygen, lung nodule, hep C, chronic pain syndrome, substance abuse. Patient of Dr. Valeta Harms, seen for initial consult on 12/25/18. Maintained on oxygen, prednisone 10mg  daily. Never managed by Pulmonologist. Started on Pulmicort twice daily, Perforomist and Yupelri nebulizer's. Prednisone decreased to 5mg  daily. Referred to outpatient palliative care. Previously lived in assisted living facility. Patient is DNR.   04/13/2019 Patient presents today for 3 month follow-up. Reports increased sob and cough with brown sputum. States that she is compliant with all her nebulizer's but appears that she needs refilled because they are expired. Moved out of Assisted living and currently with her daughter. Her son passed away in Feb 08, 2023 at the age of 82. She is deconditioned, has walker at home.  She is asking about a smaller oxygen tank. Ambulatory O2 low 88-90% RA after 2 laps with walker and nursing assistance. She is being followed by palliative care. Ativan last refilled in June by PCP. PMP overdose risk score 80. No unexpected prescriptions found.   Allergies  Allergen Reactions  . Aspirin     Upset stomach    Immunization History  Administered Date(s) Administered  . Influenza Split 11/29/2012    Past Medical History:  Diagnosis Date  . Anemia    "as a child"  . Anxiety   . Arthritis    "shoulders; back" (03/16/2015)  . Chronic back pain   . Chronic bronchitis ([place])    "I basically keep it" (03/16/2015)  . Chronic pain   . COPD (chronic obstructive pulmonary disease) ([place])   . DDD (degenerative disc disease), lumbar   . Degenerative disc disease   . Depression   . Ectatic aorta ([place]) 07/08/2017  . Family history of adverse  reaction to anesthesia    "my mother had an allergic reaction when she had kidney removed back in the '60's or '70's""  . Headache    "monthly; need glasses; comes on when I'm stressed or get too tired" (03/16/2015)  . Hepatitis C   . History of stomach ulcers   . Hypertension   . Kidney stone   . MRSA carrier 07/09/2017  . On home O2    "~ 3L once/wk and prn" (03/16/2015)  . Pain management   . Pneumonia    "@ least once/yr for the past 8 yrs" (03/16/2015)  . Spinal stenosis   . Tobacco use     Tobacco History: Social History   Tobacco Use  Smoking Status Former Smoker  . Packs/day: 0.25  . Years: 36.00  . Pack years: 9.00  . Types: Cigarettes  Smokeless Tobacco Never Used  Tobacco Comment   1/2 pack per day 12/25/18.    Counseling given: Not Answered Comment: 1/2 pack per day 12/25/18.    Outpatient Medications Prior to Visit  Medication Sig Dispense Refill  . albuterol (PROVENTIL HFA;VENTOLIN HFA) 108 (90 Base) MCG/ACT inhaler Inhale 2 puffs into the lungs every 6 (six) hours as needed for wheezing or shortness of breath. 1 Inhaler 6  . albuterol (PROVENTIL) (2.5 MG/3ML) 0.083% nebulizer solution Take 3 mLs (2.5 mg total) by nebulization every 6 (six) hours as needed for wheezing or shortness of breath (buy this only if you cannot afford the Duoneb). 75  mL 12  . amLODipine (NORVASC) 5 MG tablet Take 1 tablet (5 mg total) by mouth daily. 30 tablet 0  . DULoxetine (CYMBALTA) 30 MG capsule Take 30 mg by mouth at bedtime.     . DULoxetine (CYMBALTA) 60 MG capsule Take 60 mg by mouth daily.     . feeding supplement, ENSURE ENLIVE, (ENSURE ENLIVE) LIQD Take 237 mLs by mouth 3 (three) times daily between meals. 60 Bottle 0  . gabapentin (NEURONTIN) 800 MG tablet Take 800 mg by mouth 4 (four) times daily.     Marland Kitchen ibuprofen (ADVIL,MOTRIN) 200 MG tablet Take 800 mg by mouth every 6 (six) hours as needed for moderate pain.     . Lactobacillus-Inulin ([place]) CAPS Take  by mouth daily.    Marland Kitchen LORazepam (ATIVAN) 0.5 MG tablet Take 0.5 mg by mouth 4 (four) times daily.     . Nebulizers (NEBULIZER COMPRESSOR) MISC Use as directed 1 each 0  . potassium chloride (K-DUR,KLOR-CON) 10 MEQ tablet Take 10 mEq by mouth daily.    . predniSONE (DELTASONE) 10 MG tablet Take 10 mg by mouth daily with breakfast.     . sertraline (ZOLOFT) 100 MG tablet Take 100 mg by mouth daily.     Marland Kitchen tiZANidine (ZANAFLEX) 4 MG tablet Take 4 mg by mouth 2 (two) times daily as needed for muscle spasms.     . varenicline (CHANTIX PAK) 0.5 MG X 11 & 1 MG X 42 tablet Take one 0.5mg  tab once daily x3 days, then increase to one 0.5mg  tab twice daily x 4 days, then increase to one 1mg  tab twice daily. 53 tablet 0  . albuterol (PROVENTIL) (2.5 MG/3ML) 0.083% nebulizer solution Take 3 mLs (2.5 mg total) by nebulization every 4 (four) hours as needed for wheezing or shortness of breath. 75 mL 5  . tiotropium (SPIRIVA HANDIHALER) 18 MCG inhalation capsule Place 1 capsule (18 mcg total) into inhaler and inhale daily. 30 capsule 0  . budesonide (PULMICORT) 0.5 MG/2ML nebulizer solution Take 2 mLs (0.5 mg total) by nebulization 2 (two) times daily for 30 days. 120 mL 11  . formoterol (PERFOROMIST) 20 MCG/2ML nebulizer solution Take 2 mLs (20 mcg total) by nebulization 2 (two) times daily for 30 days. 120 mL 11  . LORazepam (ATIVAN) 1 MG tablet Take 1 mg by mouth every 4 (four) hours as needed for anxiety.      No facility-administered medications prior to visit.    Review of Systems  Review of Systems  Constitutional: Positive for fatigue.  Respiratory: Positive for cough and shortness of breath. Negative for wheezing.   Musculoskeletal:       Deconditioned   Physical Exam  BP 100/70   Pulse 60   Temp 98 F (36.7 C) (Oral)   Ht 5\' 6"  (1.676 m)   Wt 118 lb 12.8 oz (53.9 kg)   SpO2 99%   BMI 19.17 kg/m  Physical Exam Constitutional:      Appearance: Normal appearance. She is not ill-appearing.   HENT:     Head: Normocephalic and atraumatic.  Cardiovascular:     Rate and Rhythm: Normal rate and regular rhythm.  Pulmonary:     Effort: Pulmonary effort is normal.     Breath sounds: Rhonchi present. No wheezing.  Musculoskeletal: Normal range of motion.     Comments: In wheelchair  Skin:    General: Skin is warm and dry.  Neurological:     General: No focal deficit present.  Mental Status: She is alert and oriented to person, place, and time. Mental status is at baseline.  Psychiatric:        Thought Content: Thought content normal.        Judgment: Judgment normal.     Comments: Teary eyed      Lab Results:  CBC    Component Value Date/Time   WBC 11.4 (H) 12/05/2018 1256   RBC 4.45 12/05/2018 1256   HGB 13.7 12/05/2018 1256   HCT 45.7 12/05/2018 1256   PLT 217 12/05/2018 1256   MCV 102.7 (H) 12/05/2018 1256   MCH 30.8 12/05/2018 1256   MCHC 30.0 12/05/2018 1256   RDW 13.1 12/05/2018 1256   LYMPHSABS 1.5 12/05/2018 1256   MONOABS 0.4 12/05/2018 1256   EOSABS 0.0 12/05/2018 1256   BASOSABS 0.1 12/05/2018 1256    BMET    Component Value Date/Time   NA 137 12/05/2018 1256   K 4.3 12/05/2018 1256   CL 103 12/05/2018 1256   CO2 29 12/05/2018 1256   GLUCOSE 98 12/05/2018 1256   BUN 8 12/05/2018 1256   CREATININE 0.88 12/05/2018 1256   CALCIUM 9.3 12/05/2018 1256   GFRNONAA >60 12/05/2018 1256   GFRAA >60 12/05/2018 1256    BNP    Component Value Date/Time   BNP 62.6 04/06/2016 0208    ProBNP    Component Value Date/Time   PROBNP 131.3 (H) 05/06/2014 1909    Imaging: No results found.   Assessment & Plan:   COPD (chronic obstructive pulmonary disease) ([facility]) - Continue Pulmicort nebulizer twice daily (refill sent) - Continue Perforomist nebulizer twice daily (refill sent) - Continue Yupelri nebulizer once daily (prescription sent) - Continue prednisone 5mg  daily  - Palliative care following - Return in 4-6 weeks with Dr. Valeta Harms     COPD exacerbation ([place]) - No wheezing on exam, scattered rhonchi  - Rx doxycycline 1 tab twice daily x 7 days - Recommend Covid testing d/t high risk patient and cough/sob   Acute respiratory failure with hypoxia ([facility]) - O2 88-90% ambulatory on room air - Qualify for POC 2L on exertion   TOBACCO ABUSE - Chantix as prescribed   Grief - Lost her son recently in April  - Refer to behavioral health for counseling   Physical deconditioning - Appeared moderately deconditioned in wheelchair today  - Encouraged walker use at home  - Needs PT eval and treat    Martyn Ehrich, NP 04/13/2019

## 2019-04-13 NOTE — Assessment & Plan Note (Signed)
-   O2 88-90% ambulatory on room air - Qualify for POC 2L on exertion

## 2019-04-13 NOTE — Telephone Encounter (Signed)
Attempted to contact mobile number x2 , line rang busy. Attempted to contact daughter number, voicemail is full.

## 2019-04-13 NOTE — Assessment & Plan Note (Addendum)
-   No wheezing on exam, scattered rhonchi  - Rx doxycycline 1 tab twice daily x 7 days - Recommend Covid testing d/t high risk patient and cough/sob

## 2019-04-13 NOTE — Telephone Encounter (Signed)
I attempted to call pt on both home and mobile phone numbers provided. Home phone went straight to voicemail, voicemail box was full. Mobile phone line rang busy. Will keep in triage box to f/u on.

## 2019-04-13 NOTE — Assessment & Plan Note (Addendum)
-   Continue Pulmicort nebulizer twice daily (refill sent) - Continue Perforomist nebulizer twice daily (refill sent) - Continue Yupelri nebulizer once daily (prescription sent) - Continue prednisone 5mg  daily  - Palliative care following - Return in 4-6 weeks with Dr. Valeta Harms

## 2019-04-13 NOTE — Addendum Note (Signed)
Addended by: Valerie Salts on: 04/13/2019 01:48 PM   Modules accepted: Orders

## 2019-04-13 NOTE — Patient Instructions (Addendum)
Ambulatory O2:  O2 88-90% room air after 2-3 laps  Orders: Covid testing- cough and sob  Referral: Behavioral health- recent loss Physical therapy- home eval and treat   Oxygen: DME company for POC- use 2L on exertion    RX: Doxycycline 1 tab twice daily x 7 days  Recommendations: Continue prednisone 5mg  daily  Continue Pulmicort nebulizer twice daily Continue Perforomist nebulizer twice daily Continue Yupelri nebulizer once daily  Follow-up: 4-6 weeks with Dr. Valeta Harms  Due for Ativan refill 7/17 - contact PCP

## 2019-04-13 NOTE — Assessment & Plan Note (Signed)
-   Appeared moderately deconditioned in wheelchair today  - Encouraged walker use at home  - Needs PT eval and treat

## 2019-04-13 NOTE — Assessment & Plan Note (Signed)
-   Chantix as prescribed

## 2019-04-13 NOTE — Assessment & Plan Note (Signed)
-   Lost her son recently in April  - Refer to behavioral health for counseling

## 2019-04-14 MED ORDER — PERFOROMIST 20 MCG/2ML IN NEBU: 20.0000 ug | INHALATION_SOLUTION | Freq: Two times a day (BID) | RESPIRATORY_TRACT | 11 refills | Status: AC

## 2019-04-14 MED ORDER — DOXYCYCLINE HYCLATE 100 MG PO TABS: 100.0000 mg | ORAL_TABLET | Freq: Two times a day (BID) | ORAL | 0 refills | Status: DC

## 2019-04-14 MED ORDER — BUDESONIDE 0.5 MG/2ML IN SUSP: 0.5000 mg | Freq: Two times a day (BID) | RESPIRATORY_TRACT | 11 refills | Status: AC

## 2019-04-14 NOTE — Telephone Encounter (Signed)
Call returned to patient, she states her medications were sent to the wrong pharmacy. She needs them sent to [facility] on [address]. Made this patient aware I would have them sent to correct pharmacy. Medications and pharmacy confirmed.   [facility] to [facility] [place], they confirmed they do have the prescriptions. I asked if they were able to transfer the scripts they said no. Made them aware to cancel the scripts sent. Voiced understanding.   Medication sent to [facility] on [address] per patient request. Nothing further is needed at this time.

## 2019-04-14 NOTE — Telephone Encounter (Signed)
ATC pt, received a busy signal x2.

## 2019-04-14 NOTE — Progress Notes (Signed)
PCCM: Agree. Thanks for seeing her.  Garner Nash, DO [facility] 04/14/2019 10:49 AM

## 2019-04-15 ENCOUNTER — Telehealth: Payer: Self-pay | Admitting: Primary Care

## 2019-04-15 NOTE — Telephone Encounter (Signed)
Attempted to call pt but unable to reach and unable to leave a VM. Will try to call back later.

## 2019-04-17 NOTE — Telephone Encounter (Signed)
ATC, NA no VM

## 2019-04-20 NOTE — Telephone Encounter (Signed)
Attempted to call pt but unable to reach and unable to leave VM due to mailbox being full. Due to multiple attempts trying to reach pt without being able to do so, per triage protocol encounter will be closed.

## 2019-05-11 ENCOUNTER — Telehealth: Payer: Self-pay | Admitting: Adult Health Nurse Practitioner

## 2019-05-11 NOTE — Telephone Encounter (Signed)
Patient called over the weekend about her oxygen not working.  Did not get message until late Sunday.  Returned called this morning and patient was able to have [facility] come out and fix her oxygen machine.  States that she is getting worse and not feeling well.  Scheduled appointment on 8/5 at noon in person to evaluate.   Amy K. Olena Heckle NP

## 2019-05-13 ENCOUNTER — Other Ambulatory Visit: Payer: Self-pay

## 2019-05-13 ENCOUNTER — Other Ambulatory Visit: Payer: Medicare Other | Admitting: Adult Health Nurse Practitioner

## 2019-05-13 DIAGNOSIS — Z515 Encounter for palliative care: Secondary | ICD-10-CM

## 2019-05-13 NOTE — Progress Notes (Signed)
[facility] Consult Note Telephone: [phone]  Fax: [phone]  PATIENT NAME: Emily Moran DOB: Feb 27, 1956 MRN: [id number]  PRIMARY CARE PROVIDER:   Nolene Ebbs, MD  REFERRING PROVIDER:  June Leap MD (pulmonologist)  RESPONSIBLE PARTY:   Self Emily Moran, daughter [phone]      RECOMMENDATIONS and PLAN:  1.  End stage COPD.  Oxygen dependent @ 3L continuous.  States that she has been using her oxygen at higher levels over the past week.  States that she has been having increased SOB but thinks she has been sick with a stomach bug but is feeling much better today. She has been having increased weakness even before being sick.  States that she cannot walk very far without having to stop for breath.  States that when she walks while carrying something she gets out of breath quicker.  States that while carrying her current oxygen tank it is difficult to use her walker but she does try to use her cane when she can.  At times she feels like she would better get around in a wheelchair.  Feels like a smaller portable oxygen concentrator would make it easier for her to get around.  States that she is going to talk with her PCP about getting this arranged through a medical supply company that accepts her insurance for a portable oxygen concentrator. She states that she is unsure when her next pulmonology appointment is but thinks it may be over a month away. Have encouraged her to reach out to her pulmonologist since she has been having increased SOB and weakness to see if he would like to see her sooner.  2. Anxiety/Depression.  She still has some depression with the loss of her son in April this year.  States some days are better than others.  States that she feels anxious all the time.  Has Zofolt which she states she does not take often.  Have encouraged her to take it daily as prescribed to get better benefits.  Does admit  that she tried a Klonopin from a neighbor and felt like it gave better relief from her anxiety than the ativan she takes.  Is unsure of the dosage it was.  She also states that it helped her breathing better.  If approved by PCP, she may have better relief with switching her ativan to Klonopin.  3.  Goals of care.  DNR is in place.  She wants to stay at home with her daughter for as long as she can.  She has not had any changes in her appetite and has had no weight loss.  No reported falls.   I spent 45 minutes providing this consultation,  from 12:00 to 12:45. More than 50% of the time in this consultation was spent coordinating communication.   HISTORY OF PRESENT ILLNESS:  Emily Moran is a 63 y.o. year old female with multiple medical problems including end stage COPD, HTN, spinal stenosis, DDD, chronic back pain, depression, anxiety. Palliative Care was asked to help address goals of care.   CODE STATUS: DNR  PPS: 60% HOSPICE ELIGIBILITY/DIAGNOSIS: TBD  PHYSICAL EXAM:   General: NAD, frail appearing, thin Extremities: no edema, no joint deformities Skin: no rashes Neurological: Weakness but otherwise nonfocal  PAST MEDICAL HISTORY:  Past Medical History:  Diagnosis Date  . Anemia    "as a child"  . Anxiety   . Arthritis    "shoulders; back" (03/16/2015)  .  Chronic back pain   . Chronic bronchitis ([place])    "I basically keep it" (03/16/2015)  . Chronic pain   . COPD (chronic obstructive pulmonary disease) ([place])   . DDD (degenerative disc disease), lumbar   . Degenerative disc disease   . Depression   . Ectatic aorta ([place]) 07/08/2017  . Family history of adverse reaction to anesthesia    "my mother had an allergic reaction when she had kidney removed back in the '60's or '70's""  . Headache    "monthly; need glasses; comes on when I'm stressed or get too tired" (03/16/2015)  . Hepatitis C   . History of stomach ulcers   . Hypertension   . Kidney stone   . MRSA carrier  07/09/2017  . On home O2    "~ 3L once/wk and prn" (03/16/2015)  . Pain management   . Pneumonia    "@ least once/yr for the past 8 yrs" (03/16/2015)  . Spinal stenosis   . Tobacco use     SOCIAL HX:  Social History   Tobacco Use  . Smoking status: Former Smoker    Packs/day: 0.25    Years: 36.00    Pack years: 9.00    Types: Cigarettes  . Smokeless tobacco: Never Used  . Tobacco comment: 1/2 pack per day 12/25/18.   Substance Use Topics  . Alcohol use: No    Comment: denies h/o alcohol abuse on 03/16/2015    ALLERGIES:  Allergies  Allergen Reactions  . Aspirin     Upset stomach     PERTINENT MEDICATIONS:  Outpatient Encounter Medications as of 05/13/2019  Medication Sig  . albuterol (PROVENTIL HFA;VENTOLIN HFA) 108 (90 Base) MCG/ACT inhaler Inhale 2 puffs into the lungs every 6 (six) hours as needed for wheezing or shortness of breath.  Marland Kitchen albuterol (PROVENTIL) (2.5 MG/3ML) 0.083% nebulizer solution Take 3 mLs (2.5 mg total) by nebulization every 6 (six) hours as needed for wheezing or shortness of breath (buy this only if you cannot afford the Duoneb).  Marland Kitchen amLODipine (NORVASC) 5 MG tablet Take 1 tablet (5 mg total) by mouth daily.  . budesonide (PULMICORT) 0.5 MG/2ML nebulizer solution Take 2 mLs (0.5 mg total) by nebulization 2 (two) times daily. Please run through Medicare Dx J44.9  . doxycycline (VIBRA-TABS) 100 MG tablet Take 1 tablet (100 mg total) by mouth 2 (two) times daily.  . DULoxetine (CYMBALTA) 30 MG capsule Take 30 mg by mouth at bedtime.   . DULoxetine (CYMBALTA) 60 MG capsule Take 60 mg by mouth daily.   . feeding supplement, ENSURE ENLIVE, (ENSURE ENLIVE) LIQD Take 237 mLs by mouth 3 (three) times daily between meals.  . formoterol (PERFOROMIST) 20 MCG/2ML nebulizer solution Take 2 mLs (20 mcg total) by nebulization 2 (two) times daily. Please run through Medicare Dx J44.9  . gabapentin (NEURONTIN) 800 MG tablet Take 800 mg by mouth 4 (four) times daily.   Marland Kitchen  ibuprofen (ADVIL,MOTRIN) 200 MG tablet Take 800 mg by mouth every 6 (six) hours as needed for moderate pain.   . Lactobacillus-Inulin ([place]) CAPS Take by mouth daily.  Marland Kitchen LORazepam (ATIVAN) 0.5 MG tablet Take 0.5 mg by mouth 4 (four) times daily.   . Nebulizers (NEBULIZER COMPRESSOR) MISC Use as directed  . potassium chloride (K-DUR,KLOR-CON) 10 MEQ tablet Take 10 mEq by mouth daily.  . predniSONE (DELTASONE) 10 MG tablet Take 10 mg by mouth daily with breakfast.   . Revefenacin (YUPELRI) 175 MCG/3ML SOLN Inhale  1 Dose into the lungs daily.  . sertraline (ZOLOFT) 100 MG tablet Take 100 mg by mouth daily.   Marland Kitchen tiZANidine (ZANAFLEX) 4 MG tablet Take 4 mg by mouth 2 (two) times daily as needed for muscle spasms.   . varenicline (CHANTIX PAK) 0.5 MG X 11 & 1 MG X 42 tablet Take one 0.5mg  tab once daily x3 days, then increase to one 0.5mg  tab twice daily x 4 days, then increase to one 1mg  tab twice daily.   No facility-administered encounter medications on file as of 05/13/2019.       Ellyanna Holton Jenetta Downer, NP

## 2019-05-14 ENCOUNTER — Telehealth: Payer: Self-pay | Admitting: Pulmonary Disease

## 2019-05-14 NOTE — Telephone Encounter (Signed)
05/14/2019 1811  Triage,  Please see the note listed below.  Dr. Valeta Harms patient that needs to be seen in our office due to worsening shortness of breath.  Please contact the patient and get her scheduled.  Wyn Quaker FNP

## 2019-05-14 NOTE — Telephone Encounter (Signed)
-----   Message from Garner Nash, DO sent at 05/14/2019  2:13 PM EDT ----- [place] Please schedule follow up appointment with me to BM. Worsening SOB.  She needs a 26min appointment. She is one of my hospice patients. Thanks Leory Plowman  ----- Message ----- From: Berneta Sages, NP Sent: 05/13/2019   9:37 PM EDT To: Bradley L Icard, DO  Amy K. Olena Heckle NP Palliative Care with [facility] [phone]

## 2019-05-15 NOTE — Telephone Encounter (Signed)
LMTCB x1 for pt.  

## 2019-05-18 ENCOUNTER — Telehealth: Payer: Self-pay

## 2019-05-18 NOTE — Telephone Encounter (Signed)
Appointment made with Wyn Quaker for 05/19/19 at 0930

## 2019-05-18 NOTE — Telephone Encounter (Signed)
-----   Message from Garner Nash, DO sent at 05/14/2019  2:13 PM EDT ----- [place] Please schedule follow up appointment with me to BM. Worsening SOB.  She needs a 69min appointment. She is one of my hospice patients. Thanks Leory Plowman  ----- Message ----- From: Berneta Sages, NP Sent: 05/13/2019   9:37 PM EDT To: Bradley L Icard, DO  Amy K. Olena Heckle NP Palliative Care with [facility] [phone]

## 2019-05-18 NOTE — Telephone Encounter (Signed)
LMTCB x2  

## 2019-05-18 NOTE — Telephone Encounter (Signed)
LMTCB. Please schedule an appt with BM per BI for worsening SOB. Thanks.

## 2019-05-19 ENCOUNTER — Ambulatory Visit: Payer: Medicare Other | Admitting: Pulmonary Disease

## 2019-05-19 NOTE — Telephone Encounter (Signed)
Patient scheduled to see me on 05/19/2019.  Nothing further needed.  Wyn Quaker, FNP

## 2019-05-19 NOTE — Progress Notes (Deleted)
@Patient  ID: Emily Moran, female    DOB: Jul 07, 1956, 63 y.o.   MRN: [id number]  No chief complaint on file.   Referring provider: Nolene Ebbs, MD  HPI:  63 year old smoker followed in our office for severe COPD.  Patient has a durable DNR.  She is currently managed on home hospice.  PMH: Hep C, substance abuse history, hypertension, hyperglycemia, MRSA carrier, protein calorie malnutrition Smoker/ Smoking History: Smoker.  Down to 0.25 to 0.5 packs per day.  50-pack-year plus history Maintenance: 10 mg prednisone daily Pt of: Dr. Valeta Harms  05/19/2019  - Visit   63 year old female current every day smoker followed in our office for severe COPD.  Patient is also followed outpatient hospice.  Patient is followed by Dr. Valeta Harms in our office.  On 05/13/2019 hospice nurse practitioner visit the patient noticed that she had had worsening shortness of breath as well as worsen symptoms over the past week or 2.  Patient reporting she is having to use her oxygen more lately.  She is feeling like she is more short of breath.  She is unsure if a portable oxygen concentrator would help with her mobility.  This prompted our office to contact the patient and get her scheduled for a close follow-up.     Tests:   12/05/2018-chest x-ray- no acute cardiopulmonary disease, severe emphysema  07/08/2017-CTA chest- no evidence of PE, severe emphysema with patchy areas of atelectasis and scarring  05/07/2014-echocardiogram-LV ejection fraction 65 to 94%, grade 1 diastolic dysfunction   FENO:  No results found for: NITRICOXIDE  PFT: No flowsheet data found.  Imaging: No results found.    Specialty Problems      Pulmonary Problems   Acute respiratory failure with hypoxia (HCC)   COPD (chronic obstructive pulmonary disease) (HCC)   CAP (community acquired pneumonia)   HCAP (healthcare-associated pneumonia)   COPD exacerbation (HCC)   Lung nodule      Allergies  Allergen Reactions  .  Aspirin     Upset stomach    Immunization History  Administered Date(s) Administered  . Influenza Split 11/29/2012    Past Medical History:  Diagnosis Date  . Anemia    "as a child"  . Anxiety   . Arthritis    "shoulders; back" (03/16/2015)  . Chronic back pain   . Chronic bronchitis ([place])    "I basically keep it" (03/16/2015)  . Chronic pain   . COPD (chronic obstructive pulmonary disease) ([place])   . DDD (degenerative disc disease), lumbar   . Degenerative disc disease   . Depression   . Ectatic aorta ([place]) 07/08/2017  . Family history of adverse reaction to anesthesia    "my mother had an allergic reaction when she had kidney removed back in the '60's or '70's""  . Headache    "monthly; need glasses; comes on when I'm stressed or get too tired" (03/16/2015)  . Hepatitis C   . History of stomach ulcers   . Hypertension   . Kidney stone   . MRSA carrier 07/09/2017  . On home O2    "~ 3L once/wk and prn" (03/16/2015)  . Pain management   . Pneumonia    "@ least once/yr for the past 8 yrs" (03/16/2015)  . Spinal stenosis   . Tobacco use     Tobacco History: Social History   Tobacco Use  Smoking Status Former Smoker  . Packs/day: 0.25  . Years: 36.00  . Pack years: 9.00  . Types: Cigarettes  Smokeless Tobacco Never Used  Tobacco Comment   1/2 pack per day 12/25/18.    Counseling given: Not Answered Comment: 1/2 pack per day 12/25/18.    Continue to not smoke  Outpatient Encounter Medications as of 05/19/2019  Medication Sig  . albuterol (PROVENTIL HFA;VENTOLIN HFA) 108 (90 Base) MCG/ACT inhaler Inhale 2 puffs into the lungs every 6 (six) hours as needed for wheezing or shortness of breath.  Marland Kitchen albuterol (PROVENTIL) (2.5 MG/3ML) 0.083% nebulizer solution Take 3 mLs (2.5 mg total) by nebulization every 6 (six) hours as needed for wheezing or shortness of breath (buy this only if you cannot afford the Duoneb).  Marland Kitchen amLODipine (NORVASC) 5 MG tablet Take 1 tablet (5 mg total)  by mouth daily.  . budesonide (PULMICORT) 0.5 MG/2ML nebulizer solution Take 2 mLs (0.5 mg total) by nebulization 2 (two) times daily. Please run through Medicare Dx J44.9  . doxycycline (VIBRA-TABS) 100 MG tablet Take 1 tablet (100 mg total) by mouth 2 (two) times daily.  . DULoxetine (CYMBALTA) 30 MG capsule Take 30 mg by mouth at bedtime.   . DULoxetine (CYMBALTA) 60 MG capsule Take 60 mg by mouth daily.   . feeding supplement, ENSURE ENLIVE, (ENSURE ENLIVE) LIQD Take 237 mLs by mouth 3 (three) times daily between meals.  . formoterol (PERFOROMIST) 20 MCG/2ML nebulizer solution Take 2 mLs (20 mcg total) by nebulization 2 (two) times daily. Please run through Medicare Dx J44.9  . gabapentin (NEURONTIN) 800 MG tablet Take 800 mg by mouth 4 (four) times daily.   Marland Kitchen ibuprofen (ADVIL,MOTRIN) 200 MG tablet Take 800 mg by mouth every 6 (six) hours as needed for moderate pain.   . Lactobacillus-Inulin ([place]) CAPS Take by mouth daily.  Marland Kitchen LORazepam (ATIVAN) 0.5 MG tablet Take 0.5 mg by mouth 4 (four) times daily.   . Nebulizers (NEBULIZER COMPRESSOR) MISC Use as directed  . potassium chloride (K-DUR,KLOR-CON) 10 MEQ tablet Take 10 mEq by mouth daily.  . predniSONE (DELTASONE) 10 MG tablet Take 10 mg by mouth daily with breakfast.   . Revefenacin (YUPELRI) 175 MCG/3ML SOLN Inhale 1 Dose into the lungs daily.  . sertraline (ZOLOFT) 100 MG tablet Take 100 mg by mouth daily.   Marland Kitchen tiZANidine (ZANAFLEX) 4 MG tablet Take 4 mg by mouth 2 (two) times daily as needed for muscle spasms.   . varenicline (CHANTIX PAK) 0.5 MG X 11 & 1 MG X 42 tablet Take one 0.5mg  tab once daily x3 days, then increase to one 0.5mg  tab twice daily x 4 days, then increase to one 1mg  tab twice daily.   No facility-administered encounter medications on file as of 05/19/2019.      Review of Systems  Review of Systems   Physical Exam  There were no vitals taken for this visit.  Wt Readings from Last 5  Encounters:  04/13/19 118 lb 12.8 oz (53.9 kg)  12/25/18 116 lb 3.2 oz (52.7 kg)  07/09/17 113 lb (51.3 kg)  11/12/16 130 lb (59 kg)  07/19/16 126 lb 12.8 oz (57.5 kg)     Physical Exam   Lab Results:  CBC    Component Value Date/Time   WBC 11.4 (H) 12/05/2018 1256   RBC 4.45 12/05/2018 1256   HGB 13.7 12/05/2018 1256   HCT 45.7 12/05/2018 1256   PLT 217 12/05/2018 1256   MCV 102.7 (H) 12/05/2018 1256   MCH 30.8 12/05/2018 1256   MCHC 30.0 12/05/2018 1256   RDW 13.1 12/05/2018 1256  LYMPHSABS 1.5 12/05/2018 1256   MONOABS 0.4 12/05/2018 1256   EOSABS 0.0 12/05/2018 1256   BASOSABS 0.1 12/05/2018 1256    BMET    Component Value Date/Time   NA 137 12/05/2018 1256   K 4.3 12/05/2018 1256   CL 103 12/05/2018 1256   CO2 29 12/05/2018 1256   GLUCOSE 98 12/05/2018 1256   BUN 8 12/05/2018 1256   CREATININE 0.88 12/05/2018 1256   CALCIUM 9.3 12/05/2018 1256   GFRNONAA >60 12/05/2018 1256   GFRAA >60 12/05/2018 1256    BNP    Component Value Date/Time   BNP 62.6 04/06/2016 0208    ProBNP    Component Value Date/Time   PROBNP 131.3 (H) 05/06/2014 1909      Assessment & Plan:   No problem-specific Assessment & Plan notes found for this encounter.    No follow-ups on file.   Lauraine Rinne, NP 05/19/2019   This appointment was *** minutes long with over 50% of the time in direct face-to-face patient care, assessment, plan of care, and follow-up.

## 2019-07-16 ENCOUNTER — Telehealth: Payer: Self-pay | Admitting: Adult Health Nurse Practitioner

## 2019-07-16 NOTE — Telephone Encounter (Signed)
Patient called requesting a visit.  States having some problems with her oxygen and trying to reach [place].  States that she has been sick off and on and is going to call her PCP.  Informed her that she now has a new palliative provider in her area and that I would reach out to her to give her a call.  Have emailed new provider about visit request and RN clinical navigator to reach out to [facility] for assistance with oxygen. Hart Haas K. Olena Heckle NP

## 2019-07-27 ENCOUNTER — Inpatient Hospital Stay (HOSPITAL_COMMUNITY)
Admission: EM | Admit: 2019-07-27 | Discharge: 2019-07-30 | DRG: 194 | Disposition: A | Discharge status: Home / Self Care | Payer: Medicare Other | Attending: Family Medicine | Admitting: Family Medicine

## 2019-07-27 ENCOUNTER — Encounter (HOSPITAL_COMMUNITY): Payer: Self-pay | Admitting: Emergency Medicine

## 2019-07-27 ENCOUNTER — Other Ambulatory Visit: Payer: Medicare Other | Admitting: Hospice

## 2019-07-27 ENCOUNTER — Other Ambulatory Visit: Payer: Self-pay

## 2019-07-27 ENCOUNTER — Emergency Department (HOSPITAL_COMMUNITY): Payer: Medicare Other

## 2019-07-27 DIAGNOSIS — R519 Headache, unspecified: Secondary | ICD-10-CM | POA: Diagnosis present

## 2019-07-27 DIAGNOSIS — D649 Anemia, unspecified: Secondary | ICD-10-CM | POA: Diagnosis present

## 2019-07-27 DIAGNOSIS — J440 Chronic obstructive pulmonary disease with acute lower respiratory infection: Secondary | ICD-10-CM | POA: Diagnosis present

## 2019-07-27 DIAGNOSIS — B1920 Unspecified viral hepatitis C without hepatic coma: Secondary | ICD-10-CM | POA: Diagnosis present

## 2019-07-27 DIAGNOSIS — F17200 Nicotine dependence, unspecified, uncomplicated: Secondary | ICD-10-CM | POA: Diagnosis not present

## 2019-07-27 DIAGNOSIS — M4800 Spinal stenosis, site unspecified: Secondary | ICD-10-CM | POA: Diagnosis present

## 2019-07-27 DIAGNOSIS — Z515 Encounter for palliative care: Secondary | ICD-10-CM | POA: Diagnosis present

## 2019-07-27 DIAGNOSIS — F329 Major depressive disorder, single episode, unspecified: Secondary | ICD-10-CM | POA: Diagnosis present

## 2019-07-27 DIAGNOSIS — Z66 Do not resuscitate: Secondary | ICD-10-CM | POA: Diagnosis present

## 2019-07-27 DIAGNOSIS — Z20828 Contact with and (suspected) exposure to other viral communicable diseases: Secondary | ICD-10-CM | POA: Diagnosis present

## 2019-07-27 DIAGNOSIS — M542 Cervicalgia: Secondary | ICD-10-CM | POA: Diagnosis present

## 2019-07-27 DIAGNOSIS — I77819 Aortic ectasia, unspecified site: Secondary | ICD-10-CM | POA: Diagnosis present

## 2019-07-27 DIAGNOSIS — M5136 Other intervertebral disc degeneration, lumbar region: Secondary | ICD-10-CM | POA: Diagnosis present

## 2019-07-27 DIAGNOSIS — I10 Essential (primary) hypertension: Secondary | ICD-10-CM | POA: Diagnosis present

## 2019-07-27 DIAGNOSIS — E876 Hypokalemia: Secondary | ICD-10-CM | POA: Diagnosis present

## 2019-07-27 DIAGNOSIS — F419 Anxiety disorder, unspecified: Secondary | ICD-10-CM | POA: Diagnosis present

## 2019-07-27 DIAGNOSIS — Z825 Family history of asthma and other chronic lower respiratory diseases: Secondary | ICD-10-CM

## 2019-07-27 DIAGNOSIS — Z23 Encounter for immunization: Secondary | ICD-10-CM | POA: Diagnosis present

## 2019-07-27 DIAGNOSIS — Z7951 Long term (current) use of inhaled steroids: Secondary | ICD-10-CM

## 2019-07-27 DIAGNOSIS — Z886 Allergy status to analgesic agent status: Secondary | ICD-10-CM | POA: Diagnosis not present

## 2019-07-27 DIAGNOSIS — Z8711 Personal history of peptic ulcer disease: Secondary | ICD-10-CM | POA: Diagnosis not present

## 2019-07-27 DIAGNOSIS — J449 Chronic obstructive pulmonary disease, unspecified: Secondary | ICD-10-CM | POA: Diagnosis not present

## 2019-07-27 DIAGNOSIS — F1120 Opioid dependence, uncomplicated: Secondary | ICD-10-CM | POA: Diagnosis present

## 2019-07-27 DIAGNOSIS — G894 Chronic pain syndrome: Secondary | ICD-10-CM | POA: Diagnosis present

## 2019-07-27 DIAGNOSIS — Z981 Arthrodesis status: Secondary | ICD-10-CM

## 2019-07-27 DIAGNOSIS — R9431 Abnormal electrocardiogram [ECG] [EKG]: Secondary | ICD-10-CM | POA: Diagnosis present

## 2019-07-27 DIAGNOSIS — F17211 Nicotine dependence, cigarettes, in remission: Secondary | ICD-10-CM | POA: Diagnosis present

## 2019-07-27 DIAGNOSIS — Z9981 Dependence on supplemental oxygen: Secondary | ICD-10-CM | POA: Diagnosis not present

## 2019-07-27 DIAGNOSIS — J441 Chronic obstructive pulmonary disease with (acute) exacerbation: Secondary | ICD-10-CM | POA: Diagnosis present

## 2019-07-27 DIAGNOSIS — Z79899 Other long term (current) drug therapy: Secondary | ICD-10-CM

## 2019-07-27 DIAGNOSIS — J189 Pneumonia, unspecified organism: Principal | ICD-10-CM

## 2019-07-27 DIAGNOSIS — M1990 Unspecified osteoarthritis, unspecified site: Secondary | ICD-10-CM | POA: Diagnosis present

## 2019-07-27 DIAGNOSIS — Z823 Family history of stroke: Secondary | ICD-10-CM

## 2019-07-27 DIAGNOSIS — R079 Chest pain, unspecified: Secondary | ICD-10-CM

## 2019-07-27 DIAGNOSIS — Z8249 Family history of ischemic heart disease and other diseases of the circulatory system: Secondary | ICD-10-CM

## 2019-07-27 LAB — BASIC METABOLIC PANEL
Anion gap: 13 (ref 5–15)
BUN: 8 mg/dL (ref 8–23)
CO2: 29 mmol/L (ref 22–32)
Calcium: 8.8 mg/dL — ABNORMAL LOW (ref 8.9–10.3)
Chloride: 94 mmol/L — ABNORMAL LOW (ref 98–111)
Creatinine, Ser: 0.87 mg/dL (ref 0.44–1.00)
GFR calc Af Amer: >60 mL/min (ref >60)
GFR calc non Af Amer: >60 mL/min (ref >60)
Glucose, Bld: 104 mg/dL — ABNORMAL HIGH (ref 70–99)
Potassium: 3 mmol/L — ABNORMAL LOW (ref 3.5–5.1)
Sodium: 136 mmol/L (ref 135–145)

## 2019-07-27 LAB — SARS CORONAVIRUS 2 (TAT 6-24 HRS): SARS Coronavirus 2: NEGATIVE

## 2019-07-27 LAB — POTASSIUM: Potassium: 2.8 mmol/L — ABNORMAL LOW (ref 3.5–5.1)

## 2019-07-27 LAB — CBC
HCT: 39.3 % (ref 36.0–46.0)
Hemoglobin: 11.5 g/dL — ABNORMAL LOW (ref 12.0–15.0)
MCH: 28.3 pg (ref 26.0–34.0)
MCHC: 29.3 g/dL — ABNORMAL LOW (ref 30.0–36.0)
MCV: 96.6 fL (ref 80.0–100.0)
Platelets: 411 10*3/uL — ABNORMAL HIGH (ref 150–400)
RBC: 4.07 MIL/uL (ref 3.87–5.11)
RDW: 14.7 % (ref 11.5–15.5)
WBC: 14.3 10*3/uL — ABNORMAL HIGH (ref 4.0–10.5)
nRBC: 0 % (ref 0.0–0.2)

## 2019-07-27 LAB — TROPONIN I (HIGH SENSITIVITY)
Troponin I (High Sensitivity): 9 ng/L (ref <18)
Troponin I (High Sensitivity): 7 ng/L (ref <18)

## 2019-07-27 LAB — MAGNESIUM: Magnesium: 2.2 mg/dL (ref 1.7–2.4)

## 2019-07-27 MED ORDER — TIZANIDINE HCL 2 MG PO TABS: 4.0000 mg | ORAL_TABLET | Freq: Two times a day (BID) | ORAL | Status: DC | PRN

## 2019-07-27 MED ORDER — PNEUMOCOCCAL VAC POLYVALENT 25 MCG/0.5ML IJ INJ
0.5000 mL | INJECTION | INTRAMUSCULAR | Status: AC
  Administered 2019-07-30: 0.5 mL via INTRAMUSCULAR
  Filled 2019-07-27: qty 0.5

## 2019-07-27 MED ORDER — ALBUTEROL SULFATE HFA 108 (90 BASE) MCG/ACT IN AERS
4.0000 | INHALATION_SPRAY | RESPIRATORY_TRACT | Status: DC
  Administered 2019-07-27: 4 via RESPIRATORY_TRACT
  Filled 2019-07-27: qty 6.7

## 2019-07-27 MED ORDER — MOMETASONE FURO-FORMOTEROL FUM 200-5 MCG/ACT IN AERO
1.0000 | INHALATION_SPRAY | Freq: Two times a day (BID) | RESPIRATORY_TRACT | Status: DC
  Administered 2019-07-27 – 2019-07-30 (×6): 1 via RESPIRATORY_TRACT
  Filled 2019-07-27: qty 8.8

## 2019-07-27 MED ORDER — SODIUM CHLORIDE 0.9 % IV SOLN
2.0000 g | INTRAVENOUS | Status: DC
  Administered 2019-07-28: 2 g via INTRAVENOUS
  Filled 2019-07-27 (×2): qty 20

## 2019-07-27 MED ORDER — ALBUTEROL SULFATE HFA 108 (90 BASE) MCG/ACT IN AERS: 1.0000 | INHALATION_SPRAY | RESPIRATORY_TRACT | Status: DC | PRN

## 2019-07-27 MED ORDER — ACETAMINOPHEN 325 MG PO TABS
650.0000 mg | ORAL_TABLET | Freq: Four times a day (QID) | ORAL | Status: DC | PRN
  Administered 2019-07-30: 650 mg via ORAL
  Filled 2019-07-27: qty 2

## 2019-07-27 MED ORDER — ONDANSETRON HCL 4 MG/2ML IJ SOLN: 4.0000 mg | Freq: Four times a day (QID) | INTRAMUSCULAR | Status: DC | PRN

## 2019-07-27 MED ORDER — PREDNISONE 10 MG PO TABS
10.0000 mg | ORAL_TABLET | Freq: Every day | ORAL | Status: DC
  Administered 2019-07-28 – 2019-07-30 (×3): 10 mg via ORAL
  Filled 2019-07-27 (×3): qty 1

## 2019-07-27 MED ORDER — AMLODIPINE BESYLATE 5 MG PO TABS
5.0000 mg | ORAL_TABLET | Freq: Every day | ORAL | Status: DC
  Administered 2019-07-28 – 2019-07-30 (×3): 5 mg via ORAL
  Filled 2019-07-27 (×3): qty 1

## 2019-07-27 MED ORDER — NICOTINE 14 MG/24HR TD PT24
14.0000 mg | MEDICATED_PATCH | Freq: Every day | TRANSDERMAL | Status: DC
  Administered 2019-07-28 – 2019-07-30 (×3): 14 mg via TRANSDERMAL
  Filled 2019-07-27 (×3): qty 1

## 2019-07-27 MED ORDER — ALBUTEROL SULFATE (2.5 MG/3ML) 0.083% IN NEBU
2.5000 mg | INHALATION_SOLUTION | RESPIRATORY_TRACT | Status: DC | PRN
  Filled 2019-07-27: qty 3

## 2019-07-27 MED ORDER — ENOXAPARIN SODIUM 40 MG/0.4ML ~~LOC~~ SOLN
40.0000 mg | SUBCUTANEOUS | Status: DC
  Administered 2019-07-27 – 2019-07-29 (×3): 40 mg via SUBCUTANEOUS
  Filled 2019-07-27 (×3): qty 0.4

## 2019-07-27 MED ORDER — POTASSIUM CHLORIDE CRYS ER 20 MEQ PO TBCR
40.0000 meq | EXTENDED_RELEASE_TABLET | Freq: Once | ORAL | Status: AC
  Administered 2019-07-27: 16:00:00 40 meq via ORAL
  Filled 2019-07-27: qty 2

## 2019-07-27 MED ORDER — IPRATROPIUM BROMIDE 0.02 % IN SOLN: 3.0000 mL | Freq: Once | RESPIRATORY_TRACT | Status: DC

## 2019-07-27 MED ORDER — LORAZEPAM 0.5 MG PO TABS
0.5000 mg | ORAL_TABLET | Freq: Four times a day (QID) | ORAL | Status: DC
  Administered 2019-07-27 – 2019-07-30 (×11): 0.5 mg via ORAL
  Filled 2019-07-27 (×11): qty 1

## 2019-07-27 MED ORDER — GABAPENTIN 400 MG PO CAPS
800.0000 mg | ORAL_CAPSULE | Freq: Four times a day (QID) | ORAL | Status: DC
  Administered 2019-07-27 – 2019-07-30 (×11): 800 mg via ORAL
  Filled 2019-07-27 (×11): qty 2

## 2019-07-27 MED ORDER — GABAPENTIN 800 MG PO TABS
800.0000 mg | ORAL_TABLET | Freq: Four times a day (QID) | ORAL | Status: DC
  Filled 2019-07-27 (×2): qty 1

## 2019-07-27 MED ORDER — DULOXETINE HCL 60 MG PO CPEP
120.0000 mg | ORAL_CAPSULE | Freq: Every day | ORAL | Status: DC
  Administered 2019-07-28 – 2019-07-30 (×3): 120 mg via ORAL
  Filled 2019-07-27 (×4): qty 2

## 2019-07-27 MED ORDER — SODIUM CHLORIDE 0.9 % IV SOLN
1.0000 g | Freq: Once | INTRAVENOUS | Status: AC
  Administered 2019-07-27: 1 g via INTRAVENOUS
  Filled 2019-07-27: qty 10

## 2019-07-27 MED ORDER — METHADONE HCL 10 MG PO TABS
80.0000 mg | ORAL_TABLET | Freq: Every day | ORAL | Status: DC
  Administered 2019-07-28: 07:00:00 80 mg via ORAL
  Filled 2019-07-27: qty 8

## 2019-07-27 MED ORDER — SODIUM CHLORIDE 0.9 % IV SOLN
INTRAVENOUS | Status: DC
  Administered 2019-07-27 – 2019-07-29 (×2): via INTRAVENOUS

## 2019-07-27 MED ORDER — SERTRALINE HCL 100 MG PO TABS
100.0000 mg | ORAL_TABLET | Freq: Every day | ORAL | Status: DC
  Administered 2019-07-28: 08:00:00 100 mg via ORAL
  Filled 2019-07-27: qty 1

## 2019-07-27 MED ORDER — SODIUM CHLORIDE 0.9 % IV SOLN: 500.0000 mg | INTRAVENOUS | Status: DC

## 2019-07-27 MED ORDER — ALBUTEROL SULFATE (2.5 MG/3ML) 0.083% IN NEBU
5.0000 mg | INHALATION_SOLUTION | RESPIRATORY_TRACT | Status: DC
  Administered 2019-07-27: 5 mg via RESPIRATORY_TRACT

## 2019-07-27 MED ORDER — SODIUM CHLORIDE 0.9 % IV SOLN
500.0000 mg | Freq: Once | INTRAVENOUS | Status: AC
  Administered 2019-07-27: 500 mg via INTRAVENOUS
  Filled 2019-07-27: qty 500

## 2019-07-27 MED ORDER — IPRATROPIUM-ALBUTEROL 0.5-2.5 (3) MG/3ML IN SOLN: 3.0000 mL | Freq: Four times a day (QID) | RESPIRATORY_TRACT | Status: DC

## 2019-07-27 MED ORDER — ENSURE ENLIVE PO LIQD: 237.0000 mL | Freq: Three times a day (TID) | ORAL | Status: DC

## 2019-07-27 MED ORDER — INFLUENZA VAC SPLIT QUAD 0.5 ML IM SUSY
0.5000 mL | PREFILLED_SYRINGE | INTRAMUSCULAR | Status: AC
  Administered 2019-07-30: 0.5 mL via INTRAMUSCULAR
  Filled 2019-07-27: qty 0.5

## 2019-07-27 NOTE — ED Notes (Signed)
Patient states she called and informed hospice that she was coming to the ED.

## 2019-07-27 NOTE — ED Provider Notes (Signed)
[facility] EMERGENCY DEPARTMENT Provider Note   CSN: [id number] Arrival date & time: 07/27/19  1214     History   Chief Complaint Chief Complaint  Patient presents with  . Chest Pain    HPI Emily Moran is a 63 y.o. female.     Patient with history of advanced COPD on hospice/palliative care, on 3L chronically --presents to the emergency department with 2 days of left chest pain and increased shortness of breath and wheezing.  Patient has a chronic cough that is unchanged.  She denies any fevers.  She has had some eye redness and drainage but no nasal congestion or sore throat.  No nausea, vomiting, diarrhea.  No abdominal pain.  No recent changes to her oxygen.  She continues to use home albuterol treatments.  She is concerned that her pain is from pneumonia as she gets this frequently.  She denies any sick contacts or contacts with those with known COVID-19.     Past Medical History:  Diagnosis Date  . Anemia    "as a child"  . Anxiety   . Arthritis    "shoulders; back" (03/16/2015)  . Chronic back pain   . Chronic bronchitis ([place])    "I basically keep it" (03/16/2015)  . Chronic pain   . COPD (chronic obstructive pulmonary disease) ([place])   . DDD (degenerative disc disease), lumbar   . Degenerative disc disease   . Depression   . Ectatic aorta ([place]) 07/08/2017  . Family history of adverse reaction to anesthesia    "my mother had an allergic reaction when she had kidney removed back in the '60's or '70's""  . Headache    "monthly; need glasses; comes on when I'm stressed or get too tired" (03/16/2015)  . Hepatitis C   . History of stomach ulcers   . Hypertension   . Kidney stone   . MRSA carrier 07/09/2017  . On home O2    "~ 3L once/wk and prn" (03/16/2015)  . Pain management   . Pneumonia    "@ least once/yr for the past 8 yrs" (03/16/2015)  . Spinal stenosis   . Tobacco use     Patient Active Problem List   Diagnosis Date Noted  . Grief  04/13/2019  . Physical deconditioning 04/13/2019  . Protein-calorie malnutrition, severe 07/10/2017  . MRSA carrier 07/09/2017  . Anemia 07/08/2017  . Renal insufficiency 07/08/2017  . Ectatic aorta ([place]) 07/08/2017  . COPD exacerbation ([place]) 07/19/2016  . Lung nodule 07/19/2016  . Septic shock ([place]) 04/05/2016  . HCAP (healthcare-associated pneumonia) 04/05/2016  . CAP (community acquired pneumonia) 03/07/2016  . Acute respiratory failure with hypoxia ([place]) 03/16/2015  . COPD (chronic obstructive pulmonary disease) ([place]) 03/16/2015  . HTN (hypertension) 03/16/2015  . Leukocytosis 03/16/2015  . Hyperglycemia 03/16/2015  . Chronic pain syndrome 03/16/2015  . Hepatitis C 12/18/2007  . TOBACCO ABUSE 09/11/2007  . Substance abuse ([place]) 09/11/2007    Past Surgical History:  Procedure Laterality Date  . ANTERIOR CERVICAL DECOMP/DISCECTOMY FUSION  2002  . BACK SURGERY    . FEMUR IM NAIL Right 11/28/2012   Procedure: INTRAMEDULLARY (IM) RETROGRADE FEMORAL NAILING wants jackson table , c-arm and biomet ;  Surgeon: Mauri Pole, MD;  Location: [facility];  Service: Orthopedics;  Laterality: Right;  . FRACTURE SURGERY    . INCONTINENCE SURGERY  1998  . TOTAL ABDOMINAL HYSTERECTOMY  1998     OB History   No obstetric history on file.  Home Medications    Prior to Admission medications   Medication Sig Start Date End Date Taking? Authorizing Provider  albuterol (PROVENTIL HFA;VENTOLIN HFA) 108 (90 Base) MCG/ACT inhaler Inhale 2 puffs into the lungs every 6 (six) hours as needed for wheezing or shortness of breath. 12/25/18   Icard, Leory Plowman L, DO  albuterol (PROVENTIL) (2.5 MG/3ML) 0.083% nebulizer solution Take 3 mLs (2.5 mg total) by nebulization every 6 (six) hours as needed for wheezing or shortness of breath (buy this only if you cannot afford the Duoneb). 05/29/18   Jola Schmidt, MD  amLODipine (NORVASC) 5 MG tablet Take 1 tablet (5 mg total) by mouth daily. 07/12/17   Mariel Aloe, MD  budesonide (PULMICORT) 0.5 MG/2ML nebulizer solution Take 2 mLs (0.5 mg total) by nebulization 2 (two) times daily. Please run through [facility] Dx J44.9 04/14/19 05/14/19  Martyn Ehrich, NP  doxycycline (VIBRA-TABS) 100 MG tablet Take 1 tablet (100 mg total) by mouth 2 (two) times daily. 04/14/19   Martyn Ehrich, NP  DULoxetine (CYMBALTA) 30 MG capsule Take 30 mg by mouth at bedtime.  11/21/18   [provider]  DULoxetine (CYMBALTA) 60 MG capsule Take 60 mg by mouth daily.     [provider]  feeding supplement, ENSURE ENLIVE, (ENSURE ENLIVE) LIQD Take 237 mLs by mouth 3 (three) times daily between meals. 07/11/17   Mariel Aloe, MD  formoterol (PERFOROMIST) 20 MCG/2ML nebulizer solution Take 2 mLs (20 mcg total) by nebulization 2 (two) times daily. Please run through [facility] Dx J44.9 04/14/19 05/14/19  Martyn Ehrich, NP  gabapentin (NEURONTIN) 800 MG tablet Take 800 mg by mouth 4 (four) times daily.     [provider]  ibuprofen (ADVIL,MOTRIN) 200 MG tablet Take 800 mg by mouth every 6 (six) hours as needed for moderate pain.     [provider]  Lactobacillus-Inulin ([place]) CAPS Take by mouth daily.    [provider]  LORazepam (ATIVAN) 0.5 MG tablet Take 0.5 mg by mouth 4 (four) times daily.  11/20/18   [provider]  Nebulizers (NEBULIZER COMPRESSOR) MISC Use as directed 12/25/18   Icard, Leory Plowman L, DO  potassium chloride (K-DUR,KLOR-CON) 10 MEQ tablet Take 10 mEq by mouth daily.    [provider]  predniSONE (DELTASONE) 10 MG tablet Take 10 mg by mouth daily with breakfast.  11/21/18   [provider]  Revefenacin (YUPELRI) 175 MCG/3ML SOLN Inhale 1 Dose into the lungs daily. 04/13/19   Martyn Ehrich, NP  sertraline (ZOLOFT) 100 MG tablet Take 100 mg by mouth daily.  11/21/18   [provider]  tiZANidine (ZANAFLEX) 4 MG tablet Take 4 mg by mouth 2 (two) times daily as  needed for muscle spasms.  07/18/16   [provider]  varenicline (CHANTIX PAK) 0.5 MG X 11 & 1 MG X 42 tablet Take one 0.5mg  tab once daily x3 days, then increase to one 0.5mg  tab twice daily x 4 days, then increase to one 1mg  tab twice daily. 01/02/19   Garner Nash, DO    Family History Family History  Problem Relation Age of Onset  . Cancer Mother   . Emphysema Mother   . Heart failure Father   . Stroke Father     Social History Social History   Tobacco Use  . Smoking status: Former Smoker    Packs/day: 0.25    Years: 36.00    Pack years: 9.00  Types: Cigarettes  . Smokeless tobacco: Never Used  . Tobacco comment: 1/2 pack per day 12/25/18.   Substance Use Topics  . Alcohol use: No    Comment: denies h/o alcohol abuse on 03/16/2015  . Drug use: Yes    Types: Marijuana    Comment: 03/17/2015 History of narcotic abuse, "currently with [place] on Methadone; I've been clean for almost 2 yrs now"     Allergies   Aspirin   Review of Systems Review of Systems  Constitutional: Negative for diaphoresis and fever.  Eyes: Negative for redness.  Respiratory: Positive for cough, shortness of breath and wheezing.   Cardiovascular: Positive for chest pain. Negative for palpitations and leg swelling.  Gastrointestinal: Negative for abdominal pain, nausea and vomiting.  Genitourinary: Negative for dysuria.  Musculoskeletal: Negative for back pain and neck pain.  Skin: Negative for rash.  Neurological: Negative for syncope and light-headedness.  Psychiatric/Behavioral: The patient is not nervous/anxious.      Physical Exam Updated Vital Signs BP 117/87 (BP Location: Right Arm)   Pulse 73   Temp 98.6 F (37 C) (Oral)   Resp 11   SpO2 98%   Physical Exam Vitals signs and nursing note reviewed.  Constitutional:      Appearance: She is well-developed.  HENT:     Head: Normocephalic and atraumatic.  Eyes:     General:        Right eye:  No discharge.        Left eye: No discharge.     Conjunctiva/sclera: Conjunctivae normal.  Neck:     Musculoskeletal: Normal range of motion and neck supple.  Cardiovascular:     Rate and Rhythm: Normal rate and regular rhythm.     Heart sounds: Normal heart sounds.  Pulmonary:     Effort: Pulmonary effort is normal.     Breath sounds: Decreased breath sounds (Globally) and wheezing present.  Abdominal:     Palpations: Abdomen is soft.     Tenderness: There is no abdominal tenderness.  Skin:    General: Skin is warm and dry.  Neurological:     Mental Status: She is alert.      ED Treatments / Results  Labs (all labs ordered are listed, but only abnormal results are displayed) Labs Reviewed  BASIC METABOLIC PANEL - Abnormal; Notable for the following components:      Result Value   Potassium 3.0 (*)    Chloride 94 (*)    Glucose, Bld 104 (*)    Calcium 8.8 (*)    All other components within normal limits  CBC - Abnormal; Notable for the following components:   WBC 14.3 (*)    Hemoglobin 11.5 (*)    MCHC 29.3 (*)    Platelets 411 (*)    All other components within normal limits  SARS CORONAVIRUS 2 (TAT 6-24 HRS)  CULTURE, BLOOD (ROUTINE X 2)  CULTURE, BLOOD (ROUTINE X 2)  TROPONIN I (HIGH SENSITIVITY)  TROPONIN I (HIGH SENSITIVITY)    EKG EKG Interpretation  Date/Time:  Monday July 27 2019 14:18:14 EDT Ventricular Rate:  64 PR Interval:  134 QRS Duration: 84 QT Interval:  618 QTC Calculation: 638 R Axis:   89 Text Interpretation:  Sinus rhythm Borderline right axis deviation Prolonged QT interval Non-specific ST-t changes Confirmed by Lajean Saver [phone]) on 07/27/2019 3:04:51 PM   Radiology Dg Chest 2 View  Result Date: 07/27/2019 CLINICAL DATA:  Chest pain, shortness of breath. EXAM: CHEST -  2 VIEW COMPARISON:  12/05/2018 FINDINGS: Interstitial and airspace opacity superimposed on background emphysema in the left lung base and lingula obscuring the  left cardiac border. To a lesser extent this is seen also on the right. Large bullous changes in the right upper lobe are similar to the previous study. Cardiomediastinal contours are stable. Signs of cervical spinal fusion are partially imaged. Lungs remain hyperinflated. IMPRESSION: 1. Findings are most consistent with multifocal pneumonia superimposed on background emphysema. Recommend follow-up to resolution. 2. Stable bullous changes in the right upper lobe. Electronically Signed   By: Zetta Bills M.D.   On: 07/27/2019 12:44    Procedures Procedures (including critical care time)  Medications Ordered in ED Medications  cefTRIAXone (ROCEPHIN) 1 g in sodium chloride 0.9 % 100 mL IVPB (has no administration in time range)  azithromycin (ZITHROMAX) 500 mg in sodium chloride 0.9 % 250 mL IVPB (has no administration in time range)  albuterol (VENTOLIN HFA) 108 (90 Base) MCG/ACT inhaler 4 puff (has no administration in time range)  ipratropium (ATROVENT HFA) inhaler 2 puff (has no administration in time range)  potassium chloride SA (KLOR-CON) CR tablet 40 mEq (has no administration in time range)     Initial Impression / Assessment and Plan / ED Course  I have reviewed the triage vital signs and the nursing notes.  Pertinent labs & imaging results that were available during my care of the patient were reviewed by me and considered in my medical decision making (see chart for details).        Patient seen and examined. Work-up initiated. Medications ordered.   Vital signs reviewed and are as follows: BP 117/87 (BP Location: Right Arm)   Pulse 73   Temp 98.6 F (37 C) (Oral)   Resp 11   SpO2 98%   CXR and EKG reviewed. Will await remainder of labs, ambulate, send COVID.   Pt ambulated but is SOB.  Patient states that she is very weak and is having difficulty at home.  Will request admission to the hospitalist service in light of the patient's multifocal pneumonia in setting of  end-stage COPD.  Blood cultures ordered.  Antibiotics ordered.  As needed breathing medications ordered.  She continues to have some wheezing on exam but is in no respiratory distress.  3:57 PM Discussed with hospitalist who will see.    Final Clinical Impressions(s) / ED Diagnoses   Final diagnoses:  Multifocal pneumonia  Chest pain, unspecified type  COPD exacerbation ([place])  Hypokalemia   Pt with advanced COPD, multifocal PNA --admit for treatment.  Covid pending.   ED Discharge Orders    None       Carlisle Cater, PA-C 07/27/19 1558    Lajean Saver, MD 07/29/19 [phone]

## 2019-07-27 NOTE — ED Notes (Signed)
Dinner Tray Ordered @ 1758-per Tawanna Solo, RN called by Levada Dy

## 2019-07-27 NOTE — Progress Notes (Signed)
[facility] Consult Note Telephone: [phone]  Fax: [phone]  PATIENT NAME: Emily Moran DOB: 1956-01-31 MRN: [id number]  PRIMARY CARE PROVIDER:   Nolene Ebbs, MD  REFERRING PROVIDER:  June Leap MD (pulmonologist)  RESPONSIBLE PARTY:   Self [phone] Emily Moran, daughter [phone]  TELEHEALTH VISIT STATEMENT Due to the COVID-19 crisis, this visit was done via telephone from my office. It was initiated and consented to by this patient and/or family.   RECOMMENDATIONS/PLAN:  1. Advance Care Planning/Goals of Care: Patient and daughter Emily Moran reported about to proceed to ER due to respiratory distress. She affirmed she took her breathing treatment,  is on her oxygen dialled up to 4L/Min at this time. Education on pursed lip breathing provided. She affirmed she is a DNR but is contemplating changing to Full code when she gets to the hospital since she knows she has 'serious breathing problem and pneumonia'. Education provided that she can be treated for pneumonia as a DNR patient. DNR simple means if she passed/stopped breathing/heart stops beating, she chooses to go peacefully and not do chest compressions to resuscitate her. She verbalized understanding. Goals include to maximize quality of life and symptom management.   2. Symptom management: Patient with respiratory distress/SOB r/t advanced COPD. Oxygen increased from 3L/Min to 4L/Min, complying with education on pursed lip breathing. She took her breathing treatment as ordered. She has called EMS and is ready to go to the ER for further evaluation and treatment.   3. Follow up Palliative Care Visit: Palliative care will continue to follow for goals of care clarification and symptom management.   I spent 30 minutes providing this consultation, from 11.30am to 12pm. More than 50% of the time in this consultation was spent on coordinating care communication. HISTORY OF  PRESENT ILLNESS:  Emily Moran is a 63 y.o. year old female with multiple medical problems including end stage COPD, HTN, spinal stenosis, DDD, chronic back pain, depression, anxiety. Palliative Care was asked to help address goals of care.   CODE STATUS: DNR  PPS: 60% HOSPICE ELIGIBILITY/DIAGNOSIS: TBD  PAST MEDICAL HISTORY:  Past Medical History:  Diagnosis Date  . Anemia    "as a child"  . Anxiety   . Arthritis    "shoulders; back" (03/16/2015)  . Chronic back pain   . Chronic bronchitis ([place])    "I basically keep it" (03/16/2015)  . Chronic pain   . COPD (chronic obstructive pulmonary disease) ([place])   . DDD (degenerative disc disease), lumbar   . Degenerative disc disease   . Depression   . Ectatic aorta ([place]) 07/08/2017  . Family history of adverse reaction to anesthesia    "my mother had an allergic reaction when she had kidney removed back in the '60's or '70's""  . Headache    "monthly; need glasses; comes on when I'm stressed or get too tired" (03/16/2015)  . Hepatitis C   . History of stomach ulcers   . Hypertension   . Kidney stone   . MRSA carrier 07/09/2017  . On home O2    "~ 3L once/wk and prn" (03/16/2015)  . Pain management   . Pneumonia    "@ least once/yr for the past 8 yrs" (03/16/2015)  . Spinal stenosis   . Tobacco use     SOCIAL HX:  Social History   Tobacco Use  . Smoking status: Former Smoker    Packs/day: 0.25    Years: 36.00  Pack years: 9.00    Types: Cigarettes  . Smokeless tobacco: Never Used  . Tobacco comment: 1/2 pack per day 12/25/18.   Substance Use Topics  . Alcohol use: No    Comment: denies h/o alcohol abuse on 03/16/2015    ALLERGIES:  Allergies  Allergen Reactions  . Aspirin     Upset stomach     PERTINENT MEDICATIONS:  Outpatient Encounter Medications as of 07/27/2019  Medication Sig  . albuterol (PROVENTIL HFA;VENTOLIN HFA) 108 (90 Base) MCG/ACT inhaler Inhale 2 puffs into the lungs every 6 (six) hours as needed for  wheezing or shortness of breath.  Marland Kitchen albuterol (PROVENTIL) (2.5 MG/3ML) 0.083% nebulizer solution Take 3 mLs (2.5 mg total) by nebulization every 6 (six) hours as needed for wheezing or shortness of breath (buy this only if you cannot afford the Duoneb).  Marland Kitchen amLODipine (NORVASC) 5 MG tablet Take 1 tablet (5 mg total) by mouth daily.  . budesonide (PULMICORT) 0.5 MG/2ML nebulizer solution Take 2 mLs (0.5 mg total) by nebulization 2 (two) times daily. Please run through [employer] Dx J44.9  . doxycycline (VIBRA-TABS) 100 MG tablet Take 1 tablet (100 mg total) by mouth 2 (two) times daily.  . DULoxetine (CYMBALTA) 30 MG capsule Take 30 mg by mouth at bedtime.   . DULoxetine (CYMBALTA) 60 MG capsule Take 60 mg by mouth daily.   . feeding supplement, ENSURE ENLIVE, (ENSURE ENLIVE) LIQD Take 237 mLs by mouth 3 (three) times daily between meals.  . formoterol (PERFOROMIST) 20 MCG/2ML nebulizer solution Take 2 mLs (20 mcg total) by nebulization 2 (two) times daily. Please run through [employer] Dx J44.9  . gabapentin (NEURONTIN) 800 MG tablet Take 800 mg by mouth 4 (four) times daily.   Marland Kitchen ibuprofen (ADVIL,MOTRIN) 200 MG tablet Take 800 mg by mouth every 6 (six) hours as needed for moderate pain.   . Lactobacillus-Inulin ([place]) CAPS Take by mouth daily.  Marland Kitchen LORazepam (ATIVAN) 0.5 MG tablet Take 0.5 mg by mouth 4 (four) times daily.   . Nebulizers (NEBULIZER COMPRESSOR) MISC Use as directed  . potassium chloride (K-DUR,KLOR-CON) 10 MEQ tablet Take 10 mEq by mouth daily.  . predniSONE (DELTASONE) 10 MG tablet Take 10 mg by mouth daily with breakfast.   . Revefenacin (YUPELRI) 175 MCG/3ML SOLN Inhale 1 Dose into the lungs daily.  . sertraline (ZOLOFT) 100 MG tablet Take 100 mg by mouth daily.   Marland Kitchen tiZANidine (ZANAFLEX) 4 MG tablet Take 4 mg by mouth 2 (two) times daily as needed for muscle spasms.   . varenicline (CHANTIX PAK) 0.5 MG X 11 & 1 MG X 42 tablet Take one 0.5mg  tab once daily x3  days, then increase to one 0.5mg  tab twice daily x 4 days, then increase to one 1mg  tab twice daily.   No facility-administered encounter medications on file as of 07/27/2019.     Teodoro Spray, NP

## 2019-07-27 NOTE — ED Notes (Signed)
Patient ambulated around the room while maintaining O2 at 94-97% on home 3lit

## 2019-07-27 NOTE — ED Triage Notes (Signed)
Patient c/o left sided chest pain onset of last night. Pain is non radiating. Patient also c/o right eye irritation now going into the left eye. Patient states she is in the final stage of COPD.  3L [place] continuously. Patient drinking coke during triage.

## 2019-07-27 NOTE — H&P (Signed)
History and Physical    Emily Moran HYQ:[id number] DOB: [birth year] DOA: 07/27/2019  PCP: Nolene Ebbs, MD Consultants:  Icard - pulmonology Patient coming from:  Home - lives with daughter and granddaughter; NOK: Daughter, [phone]  Chief Complaint: SOB  HPI: Emily Moran is a 63 y.o. female with medical history significant of advanced COPD on 3L home O2 and enrolled in Hospice and chronic pain presenting with SOB.  She is on palliative care at home.  She used to be able to walk from her house to car and to go get methadone every morning at 5AM.  Now, she can barely make it to her car because her breathing has gotten so much worse.  She has had draining from her eyes and now with severe pain in her left lung.  She has end-stage COPD, not sure if she has PNA or approaching the very end.  No fever.  Has been very weak, not eating well.  She is seeking to be comfortable, without severe pain.  She knows that she is dying and her family knows she is dying.  She prefers to get antibiotics.  She is worried about having an infection and spreading it to her family.  They are very careful about COVID; her daughter cleans houses and wears a mask.   ED Course:  Worsening weakness, SOB, cough, CP.  CXR read as multifocal PNA.  No hypoxia.    Review of Systems: As per HPI; otherwise review of systems reviewed and negative.   Ambulatory Status:  Ambulates with a walker, barely  Past Medical History:  Diagnosis Date  . Anemia    "as a child"  . Anxiety   . Arthritis    "shoulders; back" (03/16/2015)  . Chronic back pain   . Chronic bronchitis ([place])    "I basically keep it" (03/16/2015)  . Chronic pain   . COPD (chronic obstructive pulmonary disease) ([place])   . DDD (degenerative disc disease), lumbar   . Degenerative disc disease   . Depression   . Ectatic aorta ([place]) 07/08/2017  . Family history of adverse reaction to anesthesia    "my mother had an allergic reaction when she had kidney  removed back in the '60's or '70's""  . Headache    "monthly; need glasses; comes on when I'm stressed or get too tired" (03/16/2015)  . Hepatitis C   . History of stomach ulcers   . Hypertension   . Kidney stone   . MRSA carrier 07/09/2017  . On home O2    "~ 3L once/wk and prn" (03/16/2015)  . Pain management   . Pneumonia    "@ least once/yr for the past 8 yrs" (03/16/2015)  . Spinal stenosis   . Tobacco use     Past Surgical History:  Procedure Laterality Date  . ANTERIOR CERVICAL DECOMP/DISCECTOMY FUSION  2002  . BACK SURGERY    . FEMUR IM NAIL Right 11/28/2012   Procedure: INTRAMEDULLARY (IM) RETROGRADE FEMORAL NAILING wants jackson table , c-arm and biomet ;  Surgeon: Mauri Pole, MD;  Location: [facility];  Service: Orthopedics;  Laterality: Right;  . FRACTURE SURGERY    . INCONTINENCE SURGERY  1998  . TOTAL ABDOMINAL HYSTERECTOMY  1998    Social History   Socioeconomic History  . Marital status: Legally Separated    Spouse name: Not on file  . Number of children: Not on file  . Years of education: Not on file  . Highest education level:  Not on file  Occupational History  . Occupation: disabled  Social Needs  . Financial resource strain: Not on file  . Food insecurity    Worry: Not on file    Inability: Not on file  . Transportation needs    Medical: Not on file    Non-medical: Not on file  Tobacco Use  . Smoking status: Former Smoker    Packs/day: 1.00    Years: 47.00    Pack years: 47.00    Types: Cigarettes    Quit date: 07/09/2019    Years since quitting: 0.0  . Smokeless tobacco: Never Used  . Tobacco comment: Reports she is no longer able to smoke  Substance and Sexual Activity  . Alcohol use: No  . Drug use: Yes    Types: Marijuana    Comment: History of narcotic abuse, [place] on Methadone; uses marijuana   . Sexual activity: Not Currently  Lifestyle  . Physical activity    Days per week: Not on file    Minutes per session:  Not on file  . Stress: Not on file  Relationships  . Social Herbalist on phone: Not on file    Gets together: Not on file    Attends religious service: Not on file    Active member of club or organization: Not on file    Attends meetings of clubs or organizations: Not on file    Relationship status: Not on file  . Intimate partner violence    Fear of current or ex partner: Not on file    Emotionally abused: Not on file    Physically abused: Not on file    Forced sexual activity: Not on file  Other Topics Concern  . Not on file  Social History Narrative  . Not on file    Allergies  Allergen Reactions  . Aspirin     Upset stomach    Family History  Problem Relation Age of Onset  . Cancer Mother   . Emphysema Mother   . Heart failure Father   . Stroke Father     Prior to Admission medications   Medication Sig Start Date End Date Taking? Authorizing Provider  albuterol (PROVENTIL HFA;VENTOLIN HFA) 108 (90 Base) MCG/ACT inhaler Inhale 2 puffs into the lungs every 6 (six) hours as needed for wheezing or shortness of breath. 12/25/18  Yes Icard, Bradley L, DO  albuterol (PROVENTIL) (2.5 MG/3ML) 0.083% nebulizer solution Take 3 mLs (2.5 mg total) by nebulization every 6 (six) hours as needed for wheezing or shortness of breath (buy this only if you cannot afford the Duoneb). 05/29/18  Yes Jola Schmidt, MD  amLODipine (NORVASC) 5 MG tablet Take 1 tablet (5 mg total) by mouth daily. 07/12/17  Yes Mariel Aloe, MD  budesonide (PULMICORT) 0.5 MG/2ML nebulizer solution Take 2 mLs (0.5 mg total) by nebulization 2 (two) times daily. Please run through [facility] Dx J44.9 04/14/19 07/27/19 Yes Martyn Ehrich, NP  DULoxetine (CYMBALTA) 60 MG capsule Take 120 mg by mouth daily.    Yes [provider]  feeding supplement, ENSURE ENLIVE, (ENSURE ENLIVE) LIQD Take 237 mLs by mouth 3 (three) times daily between meals. 07/11/17  Yes Mariel Aloe, MD  formoterol  (PERFOROMIST) 20 MCG/2ML nebulizer solution Take 2 mLs (20 mcg total) by nebulization 2 (two) times daily. Please run through [facility] Dx J44.9 04/14/19 07/27/19 Yes Martyn Ehrich, NP  gabapentin (NEURONTIN) 800 MG tablet Take 800 mg  by mouth 4 (four) times daily.    Yes [provider]  LORazepam (ATIVAN) 0.5 MG tablet Take 0.5 mg by mouth 4 (four) times daily.  11/20/18  Yes [provider]  predniSONE (DELTASONE) 10 MG tablet Take 10 mg by mouth daily with breakfast.  11/21/18  Yes [provider]  PRESCRIPTION MEDICATION Take 80 mg by mouth daily. Methadone Liquid   Yes [provider]  sertraline (ZOLOFT) 100 MG tablet Take 100 mg by mouth daily.  11/21/18  Yes [provider]  tiZANidine (ZANAFLEX) 4 MG tablet Take 4 mg by mouth 2 (two) times daily as needed for muscle spasms.  07/18/16  Yes [provider]  Nebulizers (NEBULIZER COMPRESSOR) MISC Use as directed 12/25/18   Icard, Leory Plowman L, DO  varenicline (CHANTIX PAK) 0.5 MG X 11 & 1 MG X 42 tablet Take one 0.5mg  tab once daily x3 days, then increase to one 0.5mg  tab twice daily x 4 days, then increase to one 1mg  tab twice daily. Patient not taking: Reported on 07/27/2019 01/02/19   Garner Nash, DO    Physical Exam: Vitals:   07/27/19 1700 07/27/19 1715 07/27/19 1730 07/27/19 1827  BP:    (!) 125/98  Pulse: 81 76 78   Resp: 10 13 20  (!) 23  Temp:      TempSrc:      SpO2: 100% 100% 96%   Weight:      Height:         . General:  Appears chronically ill and much older than stated age, she is frail and cachectic . Eyes:  PERRL, EOMI, normal lids, iris . ENT:  grossly normal hearing, lips & tongue, mmm . Neck:  no LAD, masses or thyromegaly . Cardiovascular:  RRR, no m/r/g. No LE edema.  Marland Kitchen Respiratory:   Bibasilar honchi.  Normal respiratory effort. . Abdomen:  soft, NT, ND, NABS . Back:   normal alignment, no CVAT . Skin:  no rash or induration seen on limited exam .  Musculoskeletal:  grossly normal tone BUE/BLE, good ROM, no bony abnormality . Psychiatric:  grossly normal mood and affect, speech fluent and appropriate, AOx3 . Neurologic:  CN 2-12 grossly intact, moves all extremities in coordinated fashion, sensation intact    Radiological Exams on Admission: Dg Chest 2 View  Result Date: 07/27/2019 CLINICAL DATA:  Chest pain, shortness of breath. EXAM: CHEST - 2 VIEW COMPARISON:  12/05/2018 FINDINGS: Interstitial and airspace opacity superimposed on background emphysema in the left lung base and lingula obscuring the left cardiac border. To a lesser extent this is seen also on the right. Large bullous changes in the right upper lobe are similar to the previous study. Cardiomediastinal contours are stable. Signs of cervical spinal fusion are partially imaged. Lungs remain hyperinflated. IMPRESSION: 1. Findings are most consistent with multifocal pneumonia superimposed on background emphysema. Recommend follow-up to resolution. 2. Stable bullous changes in the right upper lobe. Electronically Signed   By: Zetta Bills M.D.   On: 07/27/2019 12:44    EKG: Independently reviewed.  NSR with rate 64; nonspecific ST changes with no evidence of acute ischemia; prolonged QTc 638   Labs on Admission: I have personally reviewed the available labs and imaging studies at the time of the admission.  Pertinent labs:   K+ 3.0 HS troponin 9 WBC 14.3 Hgb 11.5   Assessment/Plan Principal Problem:   Multifocal pneumonia Active Problems:   TOBACCO ABUSE   COPD (chronic obstructive pulmonary disease) (  HCC)   HTN (hypertension)   Chronic pain syndrome   Multifocal PNA -Patient with known end-stage COPD, on hospice, presenting with progressive weakness and SOB -It is hard to know if her current symptoms are related to terminal COPD or to multifocal PNA which was appreciated on CXR -As such, I offered her the option of home with antibiotics but she prefers  inpatient treatment to see if this will lead to improvement -COVID pending -Will admit the patient to Med Surg. -Pneumonia Severity Index (PSI) is Class 2, <1% mortality. -Will start Azithromycin 500 mg daily AND Rocephin. -NS @ 75cc/hr -Fever control -Repeat CBC in am -Sputum cultures -Blood cultures -Strep pneumo testing -Will order lower respiratory tract procalcitonin level.  Antibiotics would not be indicated for PCT <0.1 and probably should not be used for < 0.25.  >0.5 indicates infection and >>0.5 indicates more serious disease.  As the procalcitonin level normalizes, it will be reasonable to consider de-escalation of antibiotic coverage. -albuterol PRN -Standing Combivent  End-stage COPD -Patient is on hospice with end-stage COPD -She voices understanding about her terminal diagnosis and yet still wants to treat the treatable -Will return home with hospice -Continue daily prednisone  Chronic pain syndrome -Patient receives medications through [facility] for methadone associated with narcotic/opiate dependence -She is currently on 80mg  daily but will have to wean down to 30 mg daily in order for hospice to provide this -However, she is having increasing difficulty getting to her methadone clinic in order to receive her daily doses -This may have also been a motivation toward her need for hospitalization -Will continue Methadone for now at current dose  HTN -Continue Norvasc  Tobacco dependence -Encourage cessation; patient reports quitting a couple of weeks ago. -This was discussed with the patient and should be reviewed on an ongoing basis.   -Patch ordered at patient request.     Note: This patient has been tested and is pending for the novel coronavirus COVID-19.  DVT prophylaxis: Lovenox Code Status: DNR - confirmed with patient Family Communication: None present Disposition Plan:  Home once clinically improved Consults called: PT/OT/Nutrition Admission  status: Admit - It is my clinical opinion that admission to INPATIENT is reasonable and necessary because of the expectation that this patient will require hospital care that crosses at least 2 midnights to treat this condition based on the medical complexity of the problems presented.  Given the aforementioned information, the predictability of an adverse outcome is felt to be significant.    Karmen Bongo MD [facility]   How to contact the [facility] Attending or Consulting provider [place] or covering provider during after hours [place], for this patient?  1. Check the care team in [facility] and look for a) attending/consulting [facility] provider listed and b) the [facility] team listed 2. Log into [email or web address] and use [place]'s universal password to access. If you do not have the password, please contact the hospital operator. 3. Locate the [facility] provider you are looking for under [facility] and page to a number that you can be directly reached. 4. If you still have difficulty reaching the provider, please page the [facility] (Director on Call) for the Hospitalists listed on amion for assistance.   07/27/2019, 6:57 PM

## 2019-07-28 DIAGNOSIS — G894 Chronic pain syndrome: Secondary | ICD-10-CM

## 2019-07-28 DIAGNOSIS — F17200 Nicotine dependence, unspecified, uncomplicated: Secondary | ICD-10-CM

## 2019-07-28 DIAGNOSIS — J449 Chronic obstructive pulmonary disease, unspecified: Secondary | ICD-10-CM

## 2019-07-28 DIAGNOSIS — I10 Essential (primary) hypertension: Secondary | ICD-10-CM

## 2019-07-28 LAB — BASIC METABOLIC PANEL
Anion gap: 10 (ref 5–15)
BUN: 9 mg/dL (ref 8–23)
CO2: 29 mmol/L (ref 22–32)
Calcium: 8.3 mg/dL — ABNORMAL LOW (ref 8.9–10.3)
Chloride: 101 mmol/L (ref 98–111)
Creatinine, Ser: 0.94 mg/dL (ref 0.44–1.00)
GFR calc Af Amer: >60 mL/min (ref >60)
GFR calc non Af Amer: >60 mL/min (ref >60)
Glucose, Bld: 75 mg/dL (ref 70–99)
Potassium: 2.6 mmol/L — CL (ref 3.5–5.1)
Sodium: 140 mmol/L (ref 135–145)

## 2019-07-28 LAB — CBC WITH DIFFERENTIAL/PLATELET
Abs Immature Granulocytes: 0.03 10*3/uL (ref 0.00–0.07)
Basophils Absolute: 0.1 10*3/uL (ref 0.0–0.1)
Basophils Relative: 1 %
Eosinophils Absolute: 0.2 10*3/uL (ref 0.0–0.5)
Eosinophils Relative: 1 %
HCT: 36.1 % (ref 36.0–46.0)
Hemoglobin: 10.7 g/dL — ABNORMAL LOW (ref 12.0–15.0)
Immature Granulocytes: 0 %
Lymphocytes Relative: 14 %
Lymphs Abs: 1.9 10*3/uL (ref 0.7–4.0)
MCH: 28.2 pg (ref 26.0–34.0)
MCHC: 29.6 g/dL — ABNORMAL LOW (ref 30.0–36.0)
MCV: 95 fL (ref 80.0–100.0)
Monocytes Absolute: 0.9 10*3/uL (ref 0.1–1.0)
Monocytes Relative: 6 %
Neutro Abs: 10.8 10*3/uL — ABNORMAL HIGH (ref 1.7–7.7)
Neutrophils Relative %: 78 %
Platelets: 325 10*3/uL (ref 150–400)
RBC: 3.8 MIL/uL — ABNORMAL LOW (ref 3.87–5.11)
RDW: 14.8 % (ref 11.5–15.5)
WBC: 13.9 10*3/uL — ABNORMAL HIGH (ref 4.0–10.5)
nRBC: 0 % (ref 0.0–0.2)

## 2019-07-28 MED ORDER — POTASSIUM CHLORIDE 10 MEQ/100ML IV SOLN
10.0000 meq | INTRAVENOUS | Status: AC
  Administered 2019-07-28 (×3): 10 meq via INTRAVENOUS
  Filled 2019-07-28 (×4): qty 100

## 2019-07-28 MED ORDER — SODIUM CHLORIDE 0.9 % IV SOLN: 500.0000 mg | INTRAVENOUS | Status: DC

## 2019-07-28 MED ORDER — BOOST / RESOURCE BREEZE PO LIQD CUSTOM
1.0000 | Freq: Three times a day (TID) | ORAL | Status: DC
  Administered 2019-07-28 – 2019-07-30 (×7): 1 via ORAL

## 2019-07-28 MED ORDER — ALBUTEROL SULFATE (2.5 MG/3ML) 0.083% IN NEBU: 5.0000 mg | INHALATION_SOLUTION | RESPIRATORY_TRACT | Status: DC | PRN

## 2019-07-28 MED ORDER — IPRATROPIUM-ALBUTEROL 0.5-2.5 (3) MG/3ML IN SOLN
3.0000 mL | Freq: Three times a day (TID) | RESPIRATORY_TRACT | Status: DC
  Administered 2019-07-28: 3 mL via RESPIRATORY_TRACT
  Filled 2019-07-28 (×2): qty 3

## 2019-07-28 MED ORDER — POTASSIUM CHLORIDE CRYS ER 20 MEQ PO TBCR
40.0000 meq | EXTENDED_RELEASE_TABLET | Freq: Once | ORAL | Status: AC
  Administered 2019-07-28: 40 meq via ORAL
  Filled 2019-07-28: qty 2

## 2019-07-28 MED ORDER — ADULT MULTIVITAMIN W/MINERALS CH
1.0000 | ORAL_TABLET | ORAL | Status: DC
  Administered 2019-07-28 – 2019-07-29 (×2): 1 via ORAL
  Filled 2019-07-28 (×2): qty 1

## 2019-07-28 MED ORDER — DOXYCYCLINE HYCLATE 100 MG PO TABS
100.0000 mg | ORAL_TABLET | Freq: Two times a day (BID) | ORAL | Status: DC
  Administered 2019-07-28 – 2019-07-30 (×5): 100 mg via ORAL
  Filled 2019-07-28 (×5): qty 1

## 2019-07-28 MED ORDER — IPRATROPIUM-ALBUTEROL 0.5-2.5 (3) MG/3ML IN SOLN
3.0000 mL | Freq: Four times a day (QID) | RESPIRATORY_TRACT | Status: DC
  Administered 2019-07-28: 3 mL via RESPIRATORY_TRACT
  Filled 2019-07-28 (×2): qty 3

## 2019-07-28 NOTE — Evaluation (Signed)
Occupational Therapy Evaluation Patient Details Name: Emily Moran MRN: [id number] DOB: 22-Oct-1955 Today's Date: 07/28/2019    History of Present Illness [place] L Carmickle is a 63 y.o. female with medical history significant of advanced COPD on 3L home O2 and enrolled in Hospice and chronic pain presenting with SOB.  She is on palliative care at home.  She used to be able to walk from her house to car and to go get methadone every morning at 5AM.  Now, she can barely make it to her car because her breathing has gotten so much worse.  She has had draining from her eyes and now with severe pain in her left lung.  She has end-stage COPD, not sure if she has PNA or approaching the very end.    Clinical Impression   Pt admitted with above diagnoses, presenting with cardiopulmonary complications causing decreased BADL endurance. PTA pt living with dtr, needing assist for IADL management. She is able to bathe, dress, groom at mod I. She wears home O2 at 3-4 L [place] depending on activity. At time of eval she is on 3L [place] and desats easily with activity. Needing rest break cues and pursed lip breathing cues to maintain sats above 90%. Pt completed functional mobility into bathroom to complete grooming tasks, needing to rest elbows on sink to maintain sats and energy. Agree with ALF recommendation for pt, for continued supervised IADL with option of supervised ADL. Pt will not need OT f/u post acute. Will continue to follow during admission per POC listed below.   Follow Up Recommendations  No OT follow up;Other (comment)(ALF placement)    Equipment Recommendations  None recommended by OT    Recommendations for Other Services       Precautions / Restrictions Precautions Precautions: Fall Restrictions Weight Bearing Restrictions: No      Mobility Bed Mobility Overal bed mobility: Independent                Transfers Overall transfer level: Needs assistance Equipment used: None Transfers:  Sit to/from Stand Sit to Stand: Supervision         General transfer comment: no LOB, for safety    Balance Overall balance assessment: Needs assistance Sitting-balance support: No upper extremity supported;Feet supported Sitting balance-Leahy Scale: Fair     Standing balance support: During functional activity;Single extremity supported;No upper extremity supported Standing balance-Leahy Scale: Fair                             ADL either performed or assessed with clinical judgement   ADL Overall ADL's : Needs assistance/impaired Eating/Feeding: Set up;Sitting   Grooming: Supervision/safety;Standing;Oral care Grooming Details (indicate cue type and reason): resting elbows on sink to maintain energy Upper Body Bathing: Set up;Sitting   Lower Body Bathing: Set up;Sit to/from stand;Sitting/lateral leans   Upper Body Dressing : Set up;Sitting Upper Body Dressing Details (indicate cue type and reason): to don robe Lower Body Dressing: Set up;Sitting/lateral leans;Sit to/from stand Lower Body Dressing Details (indicate cue type and reason): managed socks sitting EOB Toilet Transfer: Supervision/safety;Regular Glass blower/designer Details (indicate cue type and reason): increased time for pacing and maintaining energy and breath Toileting- Clothing Manipulation and Hygiene: Set up;Sitting/lateral lean;Sit to/from stand   Tub/ Shower Transfer: Supervision/safety;Ambulation;Shower seat   Functional mobility during ADLs: Supervision/safety General ADL Comments: overqall supervision- set up level considering cardiopulmonary complications and decreased endurance     Vision Baseline Vision/History: No  visual deficits Vision Assessment?: No apparent visual deficits     Perception     Praxis      Pertinent Vitals/Pain       Hand Dominance Right   Extremity/Trunk Assessment Upper Extremity Assessment Upper Extremity Assessment: Overall WFL for tasks  assessed   Lower Extremity Assessment Lower Extremity Assessment: Defer to PT evaluation       Communication Communication Communication: No difficulties   Cognition Arousal/Alertness: Awake/alert Behavior During Therapy: WFL for tasks assessed/performed Overall Cognitive Status: Within Functional Limits for tasks assessed                                     General Comments       Exercises     Shoulder Instructions      Home Living Family/patient expects to be discharged to:: Private residence Living Arrangements: Children Available Help at Discharge: Family;Available PRN/intermittently(dtr) Type of Home: House Home Access: Stairs to enter Entrance Stairs-Number of Steps: 5 Entrance Stairs-Rails: Right Home Layout: Two level;Able to live on main level with bedroom/bathroom     Bathroom Shower/Tub: Occupational psychologist: Standard     Home Equipment: Cane - single point;Bedside commode   Additional Comments: home O2      Prior Functioning/Environment Level of Independence: Needs assistance  Gait / Transfers Assistance Needed: mod I for mobility ADL's / Homemaking Assistance Needed: pt needing assist for IADL management. Able to bath, dress, groom ind with increased time            OT Problem List: Decreased knowledge of use of DME or AE;Decreased activity tolerance;Cardiopulmonary status limiting activity;Impaired balance (sitting and/or standing)      OT Treatment/Interventions: Self-care/ADL training;Therapeutic exercise;Patient/family education;Balance training;Energy conservation;DME and/or AE instruction    OT Goals(Current goals can be found in the care plan section) Acute Rehab OT Goals Patient Stated Goal: find facility OT Goal Formulation: With patient Time For Goal Achievement: 08/11/19 Potential to Achieve Goals: Good  OT Frequency: Min 2X/week   Barriers to D/C:            Co-evaluation               AM-PAC OT "6 Clicks" Daily Activity     Outcome Measure Help from another person eating meals?: A Little Help from another person taking care of personal grooming?: A Little Help from another person toileting, which includes using toliet, bedpan, or urinal?: A Little Help from another person bathing (including washing, rinsing, drying)?: A Little Help from another person to put on and taking off regular upper body clothing?: A Little Help from another person to put on and taking off regular lower body clothing?: A Little 6 Click Score: 18   End of Session Equipment Utilized During Treatment: Gait belt;Oxygen Nurse Communication: Mobility status  Activity Tolerance: Patient tolerated treatment well Patient left: in bed;with call bell/phone within reach  OT Visit Diagnosis: Other abnormalities of gait and mobility (R26.89);Other (comment)(cardiopulmonary complications)                Time: 0630-1601 OT Time Calculation (min): 17 min Charges:  OT General Charges $OT Visit: 1 Visit OT Evaluation $OT Eval Moderate Complexity: 1 Mod OT Treatments $Self Care/Home Management : 8-22 mins  Zenovia Jarred, MSOT, OTR/L Behavioral Health OT/ Acute Relief OT [facility] Office: [phone]  Zenovia Jarred 07/28/2019, 3:16 PM

## 2019-07-28 NOTE — Progress Notes (Signed)
SATURATION QUALIFICATIONS: (This note is used to comply with regulatory documentation for home oxygen)  Patient Saturations on Room Air at Rest = 88%  Patient Saturations on Room Air while Ambulating = 61%  Patient Saturations on 5 Liters of oxygen while Ambulating = 85 %  Please briefly explain why patient needs home oxygen:Pt needs O2 at rest and with activity.   [place] Pager:  [phone]  Office:  [phone]

## 2019-07-28 NOTE — Progress Notes (Addendum)
PROGRESS NOTE  Emily Moran LTJ:[id number] DOB: [birth year] DOA: 07/27/2019 PCP: Nolene Ebbs, MD   LOS: 1 day   Brief narrative: Emily Moran is a 63 y.o. female with medical history significant of advanced COPD on 3L home O2 and enrolled in Hospice in the past  and chronic pain presented to the hospital with shortness of breath.  She is on palliative care at home.  She used to be able to walk from her house to car and to go get methadone every morning at 5AM.  Now, she can barely make it to her car because her breathing has gotten so much worse.  Patient has end-stage COPD, not sure if she has PNA or approaching the very end.  No fever.  She has been very weak, not eating well.  She is seeking to be comfortable, without severe pain.  She knows that she is dying and her family knows she is dying.  She prefers to get antibiotics.  She is worried about having an infection and spreading it to her family.  They are very careful about COVID; her daughter cleans houses and wears a mask.  Assessment/Plan:  Principal Problem:   Multifocal pneumonia Active Problems:   TOBACCO ABUSE   COPD (chronic obstructive pulmonary disease) (HCC)   HTN (hypertension)   Chronic pain syndrome   Pneumonia  Multifocal pneumonia on presentation.  Patient with end-stage COPD, on palliative care.  Now with multifocal pneumonia.  Continue on IV antibiotics.  Rocephin and Zithromax.  Gentle IV fluids.  Will follow urine culture blood cultures.  Add strep pneumo/Legionella antigen. albuterol PRN. Standing Combivent. Continue flutter valve and incentive spirometry.  Hypokalemia.  Will replenish through IV and p.o.  Check levels in a.m.  Magnesium was 2.2 yesterday.  Will hold methadone and sertraline today due to prolonged QTC with hypokalemia.  Patient will need methadone to be restarted soon.  Check EKG in a.m.  End-stage COPD -Patient was on hospice with end-stage COPD on palliative care.  He wishes to discuss  about assisted living facility at this time. -Continue daily prednisone  Chronic pain syndrome -Patient receives medications through [facility] for methadone associated with narcotic/opiate dependence -She is currently on 80mg  daily  Essential HTN -Continue Norvasc.  Blood pressure seems to be stable  Tobacco dependence Continue nicotine patch as per patient request.  Patient states that she quit few weeks back.   VTE Prophylaxis: Lovenox  Code Status: DNR  Family Communication: With the patient at bedside  Disposition Plan: Patient currently lives by herself at home.  She feels scared at home and is now interested in assisted living facility.  Will consult social services.  Will get PT, OT evaluation.   Consultants:  None  Procedures:  None  Antibiotics:  Anti-infectives (From admission, onward)   Start     Dose/Rate Route Frequency Ordered Stop   07/28/19 1600  cefTRIAXone (ROCEPHIN) 2 g in sodium chloride 0.9 % 100 mL IVPB     2 g 200 mL/hr over 30 Minutes Intravenous Every 24 hours 07/27/19 1714 08/02/19 1559   07/28/19 1600  azithromycin (ZITHROMAX) 500 mg in sodium chloride 0.9 % 250 mL IVPB  Status:  Discontinued     500 mg 250 mL/hr over 60 Minutes Intravenous Every 24 hours 07/27/19 1714 07/27/19 1750   07/27/19 1515  cefTRIAXone (ROCEPHIN) 1 g in sodium chloride 0.9 % 100 mL IVPB     1 g 200 mL/hr over 30 Minutes Intravenous  Once 07/27/19  1508 07/27/19 1807   07/27/19 1515  azithromycin (ZITHROMAX) 500 mg in sodium chloride 0.9 % 250 mL IVPB     500 mg 250 mL/hr over 60 Minutes Intravenous  Once 07/27/19 1508 07/27/19 1807     Subjective: Today, patient states that she is okay has not improved significantly but has not worsened either.  Has cough with some chest discomfort on coughing.  Denies any fever chills or rigor.    Objective: Vitals:   07/27/19 2213 07/28/19 0602  BP: (!) 137/99 122/78  Pulse: 74 72  Resp: 17 18  Temp: 97.7 F (36.5 C)  97.9 F (36.6 C)  SpO2: 92% 94%    Intake/Output Summary (Last 24 hours) at 07/28/2019 1140 Last data filed at 07/28/2019 1025 Gross per 24 hour  Intake 480 ml  Output 800 ml  Net -320 ml   Filed Weights   07/27/19 1633  Weight: 55 kg   Body mass index is 19.57 kg/m.   Physical Exam: GENERAL: Patient is alert awake and oriented. Not in obvious distress.  Thinly built.  On nasal cannula 5 L/min reading at 92%.  Chronically ill and frail. HENT: No scleral pallor or icterus. Pupils equally reactive to light. Oral mucosa is moist NECK: is supple, no palpable thyroid enlargement. CHEST: Diminished breath sounds bilaterally.  Mild basilar rhonchi noted CVS: S1 and S2 heard, no murmur. Regular rate and rhythm. No pericardial rub. ABDOMEN: Soft, non-tender, bowel sounds are present. No palpable hepato-splenomegaly. EXTREMITIES: No edema. CNS: Cranial nerves are intact. No focal motor or sensory deficits. SKIN: warm and dry without rashes.  Data Review: I have personally reviewed the following laboratory data and studies,  CBC: Recent Labs  Lab 07/27/19 1230 07/28/19 0239  WBC 14.3* 13.9*  NEUTROABS  --  10.8*  HGB 11.5* 10.7*  HCT 39.3 36.1  MCV 96.6 95.0  PLT 411* 408   Basic Metabolic Panel: Recent Labs  Lab 07/27/19 1230 07/27/19 1922 07/28/19 0239  NA 136  --  140  K 3.0* 2.8* 2.6*  CL 94*  --  101  CO2 29  --  29  GLUCOSE 104*  --  75  BUN 8  --  9  CREATININE 0.87  --  0.94  CALCIUM 8.8*  --  8.3*  MG  --  2.2  --    Liver Function Tests: No results for input(s): AST, ALT, ALKPHOS, BILITOT, PROT, ALBUMIN in the last 168 hours. No results for input(s): LIPASE, AMYLASE in the last 168 hours. No results for input(s): AMMONIA in the last 168 hours. Cardiac Enzymes: No results for input(s): CKTOTAL, CKMB, CKMBINDEX, TROPONINI in the last 168 hours. BNP (last 3 results) No results for input(s): BNP in the last 8760 hours.  ProBNP (last 3 results) No  results for input(s): PROBNP in the last 8760 hours.  CBG: No results for input(s): GLUCAP in the last 168 hours. Recent Results (from the past 240 hour(s))  SARS CORONAVIRUS 2 (TAT 6-24 HRS) Nasopharyngeal Nasopharyngeal Swab     Status: None   Collection Time: 07/27/19  1:22 PM   Specimen: Nasopharyngeal Swab  Result Value Ref Range Status   SARS Coronavirus 2 NEGATIVE NEGATIVE Final    Comment: (NOTE) SARS-CoV-2 target nucleic acids are NOT DETECTED. The SARS-CoV-2 RNA is generally detectable in upper and lower respiratory specimens during the acute phase of infection. Negative results do not preclude SARS-CoV-2 infection, do not rule out co-infections with other pathogens, and should not be used  as the sole basis for treatment or other patient management decisions. Negative results must be combined with clinical observations, patient history, and epidemiological information. The expected result is Negative. Fact Sheet for Patients: SugarRoll.be Fact Sheet for Healthcare Providers: https://www.woods-mathews.com/ This test is not yet approved or cleared by the [place] FDA and  has been authorized for detection and/or diagnosis of SARS-CoV-2 by FDA under an Emergency Use Authorization (EUA). This EUA will remain  in effect (meaning this test can be used) for the duration of the COVID-19 declaration under Section 56 4(b)(1) of the Act, 21 U.S.C. section 360bbb-3(b)(1), unless the authorization is terminated or revoked sooner. Performed at [facility], [place] [address] [place], [place] [address]   Blood culture (routine x 2)     Status: None (Preliminary result)   Collection Time: 07/27/19  3:55 PM   Specimen: BLOOD  Result Value Ref Range Status   Specimen Description BLOOD RIGHT UPPER ARM  Final   Special Requests   Final    BOTTLES DRAWN AEROBIC ONLY Blood Culture results may not be optimal due to an inadequate volume of  blood received in culture bottles   Culture   Final    NO GROWTH < 24 HOURS Performed at [facility], [place] [address] [place], [place] [address]    Report Status PENDING  Incomplete  Blood culture (routine x 2)     Status: None (Preliminary result)   Collection Time: 07/27/19  4:21 PM   Specimen: BLOOD LEFT FOREARM  Result Value Ref Range Status   Specimen Description BLOOD LEFT FOREARM  Final   Special Requests   Final    BOTTLES DRAWN AEROBIC AND ANAEROBIC Blood Culture adequate volume   Culture   Final    NO GROWTH < 24 HOURS Performed at [facility], [place] [address], [place], [place] [address]    Report Status PENDING  Incomplete     Studies: Dg Chest 2 View  Result Date: 07/27/2019 CLINICAL DATA:  Chest pain, shortness of breath. EXAM: CHEST - 2 VIEW COMPARISON:  12/05/2018 FINDINGS: Interstitial and airspace opacity superimposed on background emphysema in the left lung base and lingula obscuring the left cardiac border. To a lesser extent this is seen also on the right. Large bullous changes in the right upper lobe are similar to the previous study. Cardiomediastinal contours are stable. Signs of cervical spinal fusion are partially imaged. Lungs remain hyperinflated. IMPRESSION: 1. Findings are most consistent with multifocal pneumonia superimposed on background emphysema. Recommend follow-up to resolution. 2. Stable bullous changes in the right upper lobe. Electronically Signed   By: Zetta Bills M.D.   On: 07/27/2019 12:44    Scheduled Meds: . amLODipine  5 mg Oral Daily  . DULoxetine  120 mg Oral Daily  . enoxaparin (LOVENOX) injection  40 mg Subcutaneous Q24H  . feeding supplement (ENSURE ENLIVE)  237 mL Oral TID BM  . gabapentin  800 mg Oral QID  . influenza vac split quadrivalent PF  0.5 mL Intramuscular Tomorrow-1000  . ipratropium  3 mL Inhalation Once  . ipratropium-albuterol  3 mL Inhalation QID  . LORazepam  0.5 mg Oral QID  . methadone  80 mg  Oral Daily  . mometasone-formoterol  1 puff Inhalation BID  . nicotine  14 mg Transdermal Daily  . pneumococcal 23 valent vaccine  0.5 mL Intramuscular Tomorrow-1000  . predniSONE  10 mg Oral Q breakfast  . sertraline  100 mg Oral  Daily    Continuous Infusions: . sodium chloride 50 mL/hr at 07/27/19 1909  . cefTRIAXone (ROCEPHIN)  IV       Flora Lipps, MD  [facility] 07/28/2019

## 2019-07-28 NOTE — Evaluation (Signed)
Physical Therapy Evaluation Patient Details Name: Emily Moran MRN: [id number] DOB: 10-08-1956 Today's Date: 07/28/2019   History of Present Illness  Emily Moran is a 63 y.o. female with medical history significant of advanced COPD on 3L home O2 and enrolled in Hospice and chronic pain presenting with SOB.  She is on palliative care at home.  She used to be able to walk from her house to car and to go get methadone every morning at 5AM.  Now, she can barely make it to her car because her breathing has gotten so much worse.  She has had draining from her eyes and now with severe pain in her left lung.  She has end-stage COPD, not sure if she has PNA or approaching the very end.   Clinical Impression  Pt admitted with above diagnosis. Pt was able to ambulate to bathroom with min to min guard assist with therapist assist.  Pt did desat with activity.  See below.  Agree with A living for pt and feel that she would do well there.   Pt currently with functional limitations due to the deficits listed below (see PT Problem List). Pt will benefit from skilled PT to increase their independence and safety with mobility to allow discharge to the venue listed below.    SATURATION QUALIFICATIONS: (This note is used to comply with regulatory documentation for home oxygen)  Patient Saturations on Room Air at Rest = 88%  Patient Saturations on Room Air while Ambulating = 61%  Patient Saturations on 5 Liters of oxygen while Ambulating = 85 %  Please briefly explain why patient needs home oxygen:Pt needs O2 at rest and with activity.    Follow Up Recommendations Home health PT;Supervision - Intermittent;Other (comment)(Assisted living)    Equipment Recommendations  (rollator)    Recommendations for Other Services       Precautions / Restrictions Precautions Precautions: Fall Restrictions Weight Bearing Restrictions: No      Mobility  Bed Mobility Overal bed mobility: Independent                 Transfers Overall transfer level: Needs assistance Equipment used: None Transfers: Sit to/from Stand Sit to Stand: Supervision         General transfer comment: no issues with sit to stand.  Ambulation/Gait Ambulation/Gait assistance: Min guard;Min assist Gait Distance (Feet): 60 Feet(30 feet x 2) Assistive device: 1 person hand held assist;None Gait Pattern/deviations: Step-through pattern;Decreased stride length;Staggering left;Staggering right;Drifts right/left;Trunk flexed   Gait velocity interpretation: <1.31 ft/sec, indicative of household ambulator General Gait Details: Pt needed steadying assist throughout walk as she was reaching for furniture to steady herself with occasional HHA.  Recommended to pt to use rollator or cane.   Stairs            Wheelchair Mobility    Modified Rankin (Stroke Patients Only)       Balance Overall balance assessment: Needs assistance Sitting-balance support: No upper extremity supported;Feet supported Sitting balance-Leahy Scale: Fair     Standing balance support: During functional activity;Single extremity supported;No upper extremity supported Standing balance-Leahy Scale: Poor Standing balance comment: Pt needs steadying assist to stand dynamically and min guard for static balance.                               Pertinent Vitals/Pain Pain Assessment: No/denies pain    Home Living Family/patient expects to be discharged to:: Private residence Living  Arrangements: Children Available Help at Discharge: Family;Available PRN/intermittently(daughter) Type of Home: House Home Access: Stairs to enter Entrance Stairs-Rails: Right Entrance Stairs-Number of Steps: 5 Home Layout: Two level;Able to live on main level with bedroom/bathroom Home Equipment: Kasandra Knudsen - single point;Bedside commode(homeO2)      Prior Function Level of Independence: Independent with assistive device(s)         Comments:  ptusedcanetowalk and O2     Hand Dominance   Dominant Hand: Right    Extremity/Trunk Assessment   Upper Extremity Assessment Upper Extremity Assessment: Defer to OT evaluation    Lower Extremity Assessment Lower Extremity Assessment: Overall WFL for tasks assessed    Cervical / Trunk Assessment Cervical / Trunk Assessment: Kyphotic  Communication   Communication: No difficulties  Cognition Arousal/Alertness: Awake/alert Behavior During Therapy: WFL for tasks assessed/performed Overall Cognitive Status: Within Functional Limits for tasks assessed                                        General Comments General comments (skin integrity, edema, etc.): Pt on 3LO2 with sats to 65% on RA after walk to bathroom. Replaced O2 at 3L and took 4 min to return to 87%.  Pt baseline appears to be 88% and > on 3L.     Exercises     Assessment/Plan    PT Assessment Patient needs continued PT services  PT Problem List Decreased activity tolerance;Decreased balance;Decreased mobility;Decreased knowledge of use of DME;Decreased safety awareness;Decreased knowledge of precautions;Cardiopulmonary status limiting activity       PT Treatment Interventions DME instruction;Gait training;Functional mobility training;Therapeutic activities;Therapeutic exercise;Balance training;Patient/family education    PT Goals (Current goals can be found in the Care Plan section)  Acute Rehab PT Goals Patient Stated Goal: to go home PT Goal Formulation: With patient Time For Goal Achievement: 08/11/19 Potential to Achieve Goals: Good    Frequency Min 3X/week   Barriers to discharge Decreased caregiver support      Co-evaluation               AM-PAC PT "6 Clicks" Mobility  Outcome Measure Help needed turning from your back to your side while in a flat bed without using bedrails?: None Help needed moving from lying on your back to sitting on the side of a flat bed without using  bedrails?: None Help needed moving to and from a bed to a chair (including a wheelchair)?: A Little Help needed standing up from a chair using your arms (e.g., wheelchair or bedside chair)?: A Little Help needed to walk in hospital room?: A Little Help needed climbing 3-5 steps with a railing? : A Little 6 Click Score: 20    End of Session Equipment Utilized During Treatment: Gait belt;Oxygen Activity Tolerance: Patient limited by fatigue Patient left: with call bell/phone within reach;in bed Nurse Communication: Mobility status PT Visit Diagnosis: Unsteadiness on feet (R26.81);Muscle weakness (generalized) (M62.81)    Time: 3267-1245 PT Time Calculation (min) (ACUTE ONLY): 13 min   Charges:   PT Evaluation $PT Eval Moderate Complexity: [place] Pager:  [phone]  Office:  [phone]    Denice Paradise 07/28/2019, 11:24 AM

## 2019-07-28 NOTE — Progress Notes (Signed)
Initial Nutrition Assessment  RD working remotely.  DOCUMENTATION CODES:   Not applicable  INTERVENTION:   -D/c Ensure Enlive po BID, each supplement provides 350 kcal and 20 grams of protein -Boost Breeze po TID, each supplement provides 250 kcal and 9 grams of protein -MVI with minerals daily -Magic cup TID with meals, each supplement provides 290 kcal and 9 grams of protein  NUTRITION DIAGNOSIS:   Increased nutrient needs related to chronic illness(COPD) as evidenced by estimated needs.  GOAL:   Patient will meet greater than or equal to 90% of their needs  MONITOR:   PO intake, Supplement acceptance, Labs, Weight trends, Skin, I & O's  REASON FOR ASSESSMENT:   Consult, Malnutrition Screening Tool Assessment of nutrition requirement/status  ASSESSMENT:   Emily Moran is a 63 y.o. female with medical history significant of advanced COPD on 3L home O2 and enrolled in Hospice and chronic pain presenting with SOB.  She is on palliative care at home.  She used to be able to walk from her house to car and to go get methadone every morning at 5AM.  Now, she can barely make it to her car because her breathing has gotten so much worse.  She has had draining from her eyes and now with severe pain in her left lung.  She has end-stage COPD, not sure if she has PNA or approaching the very end.  No fever.  Has been very weak, not eating well.  She is seeking to be comfortable, without severe pain.  She knows that she is dying and her family knows she is dying.  She prefers to get antibiotics.  She is worried about having an infection and spreading it to her family.  They are very careful about COVID; her daughter cleans houses and wears a mask.  Pt admitted with multi-focal pneumonia.   Reviewed I/O's: -32 ml x 24 hours  UOP: 800 ml x 24 hours  Attempted to call pt x 2, however, unable to reach pt. Unable to obtain further nutrition-related history at this time.   Pt has end stage  COPD and is receiving hospice services. She is interested in pursuing ALF/SNF. Therapy evaluations pending.   Per chart review, pt with very poor appetite PTA. Noted pt consumed 100% of meals here per doc flowsheets. Pt is refusing Ensure Enlive supplements.   Reviewed wt hx; noted pt wt has been stable over the past year.   Pt with history of malnutrition. RD unable to identify malnutrition at this time. Suspect continued nutritional decline due to end-stage COPD on hospice care.   Medications reviewed and include prednisone.   Labs reviewed: K: 2.6.   Diet Order:   Diet Order            Diet regular Room service appropriate? Yes; Fluid consistency: Thin  Diet effective now              EDUCATION NEEDS:   No education needs have been identified at this time  Skin:  Skin Assessment: Reviewed RN Assessment  Last BM:  07/27/19  Height:   Ht Readings from Last 1 Encounters:  07/27/19 5\' 6"  (1.676 m)    Weight:   Wt Readings from Last 1 Encounters:  07/27/19 55 kg    Ideal Body Weight:  59.1 kg  BMI:  Body mass index is 19.57 kg/m.  Estimated Nutritional Needs:   Kcal:  1700-1900  Protein:  85-100 grams  Fluid:  > 1.7 L  Alveta Quintela A. Jimmye Norman, RD, LDN, [place] Registered Dietitian II Certified Diabetes Care and Education Specialist Pager: [phone] After hours Pager: [phone]

## 2019-07-28 NOTE — Social Work (Signed)
CSW acknowledging consult for ALF placement. At this time CSW team requesting a PT/OT consult for assessment of pt placement. ALF placement is usually not able to occur from hospital, however if pt requires SNF level care will assess for pertinence.   Aware that pt is active with [facility] services in the community.   Westley Hummer, MSW, [place] Work [phone]

## 2019-07-28 NOTE — Progress Notes (Signed)
CRITICAL VALUE ALERT  Critical Value:  Potassium 2.6  Date & Time Notied:  10/20 0523  Provider Notified: Lamar Blinks  Orders Received/Actions taken:

## 2019-07-29 LAB — COMPREHENSIVE METABOLIC PANEL
ALT: 9 U/L (ref 0–44)
AST: 14 U/L — ABNORMAL LOW (ref 15–41)
Albumin: 2.4 g/dL — ABNORMAL LOW (ref 3.5–5.0)
Alkaline Phosphatase: 88 U/L (ref 38–126)
Anion gap: 12 (ref 5–15)
BUN: 8 mg/dL (ref 8–23)
CO2: 26 mmol/L (ref 22–32)
Calcium: 8.6 mg/dL — ABNORMAL LOW (ref 8.9–10.3)
Chloride: 103 mmol/L (ref 98–111)
Creatinine, Ser: 0.84 mg/dL (ref 0.44–1.00)
GFR calc Af Amer: >60 mL/min (ref >60)
GFR calc non Af Amer: >60 mL/min (ref >60)
Glucose, Bld: 111 mg/dL — ABNORMAL HIGH (ref 70–99)
Potassium: 3.3 mmol/L — ABNORMAL LOW (ref 3.5–5.1)
Sodium: 141 mmol/L (ref 135–145)
Total Bilirubin: 0.3 mg/dL (ref 0.3–1.2)
Total Protein: 6.5 g/dL (ref 6.5–8.1)

## 2019-07-29 LAB — MAGNESIUM: Magnesium: 1.7 mg/dL (ref 1.7–2.4)

## 2019-07-29 LAB — STREP PNEUMONIAE URINARY ANTIGEN: Strep Pneumo Urinary Antigen: NEGATIVE

## 2019-07-29 MED ORDER — METHADONE HCL 10 MG PO TABS
80.0000 mg | ORAL_TABLET | Freq: Every day | ORAL | Status: DC
  Administered 2019-07-29: 80 mg via ORAL
  Filled 2019-07-29: qty 8

## 2019-07-29 MED ORDER — IPRATROPIUM-ALBUTEROL 0.5-2.5 (3) MG/3ML IN SOLN
3.0000 mL | Freq: Two times a day (BID) | RESPIRATORY_TRACT | Status: DC
  Filled 2019-07-29: qty 3

## 2019-07-29 MED ORDER — POTASSIUM CHLORIDE CRYS ER 20 MEQ PO TBCR
40.0000 meq | EXTENDED_RELEASE_TABLET | Freq: Every day | ORAL | Status: DC
  Administered 2019-07-29 – 2019-07-30 (×2): 40 meq via ORAL
  Filled 2019-07-29 (×2): qty 2

## 2019-07-29 MED ORDER — METHADONE HCL 10 MG PO TABS
60.0000 mg | ORAL_TABLET | Freq: Every day | ORAL | Status: DC
  Administered 2019-07-30: 60 mg via ORAL
  Filled 2019-07-29: qty 6

## 2019-07-29 NOTE — Social Work (Signed)
CSW acknowledging consult regarding pain mgmt under [facility], have deferred to [facility] (provided hand off with consult information) for home medication management questions.  Westley Hummer, MSW, [place] Work [phone]

## 2019-07-29 NOTE — Progress Notes (Signed)
Physical Therapy Treatment Patient Details Name: MESSIAH ROVIRA MRN: [id number] DOB: [birth year] Today's Date: 07/29/2019    History of Present Illness Favor L Oliveri is a 63 y.o. female with medical history significant of advanced COPD on 3L home O2 and enrolled in Hospice and chronic pain presenting with SOB.  She is on palliative care at home.  She used to be able to walk from her house to car and to go get methadone every morning at 5AM.  Now, she can barely make it to her car because her breathing has gotten so much worse.  She has had draining from her eyes and now with severe pain in her left lung.  She has end-stage COPD, not sure if she has PNA or approaching the very end.     PT Comments    Pt was able to progress OOB to ambulate in hallway with rollator and min G for safety. Pt was on 3L O2 on arrival. When sitting EOB SpO2 was at 85%. O2 was bumped up to 6L for SpO2 to reach 92% prior to ambulation. SpO2 dropped to 87% while ambulating in hallway, requiring seated break and cues for PLB to recover. Will continue to follow for mobility progression and activity tolerance.     Follow Up Recommendations  Home health PT;Supervision - Intermittent;Other (comment)(Assisted living)     Equipment Recommendations  (rollator)    Recommendations for Other Services       Precautions / Restrictions Precautions Precautions: Fall Restrictions Weight Bearing Restrictions: No    Mobility  Bed Mobility Overal bed mobility: Independent                Transfers Overall transfer level: Needs assistance Equipment used: None Transfers: Sit to/from Stand Sit to Stand: Supervision         General transfer comment: no LOB, cues for hand placement with rollator  Ambulation/Gait Ambulation/Gait assistance: Min guard Gait Distance (Feet): 50 Feet(x2) Assistive device: 4-wheeled walker Gait Pattern/deviations: Step-through pattern;Decreased stride length;Drifts right/left;Trunk  flexed     General Gait Details: Cues for sequencing and technique with RW. Pt requiring cues for safe use of brakes. During seated break pt unlocking breaks and attempting to push rollator around.    Stairs             Wheelchair Mobility    Modified Rankin (Stroke Patients Only)       Balance Overall balance assessment: Needs assistance Sitting-balance support: No upper extremity supported;Feet supported Sitting balance-Leahy Scale: Fair     Standing balance support: During functional activity;Single extremity supported;No upper extremity supported Standing balance-Leahy Scale: Fair Standing balance comment: Pt needs steadying assist to stand dynamically and min guard for static balance.                              Cognition Arousal/Alertness: Awake/alert Behavior During Therapy: WFL for tasks assessed/performed Overall Cognitive Status: Within Functional Limits for tasks assessed                                 General Comments: some impulsivity and confusion with rollator. May simply be due to learing something new.      Exercises      General Comments        Pertinent Vitals/Pain Pain Assessment: No/denies pain    Home Living  Prior Function            PT Goals (current goals can now be found in the care plan section) Acute Rehab PT Goals Patient Stated Goal: find facility PT Goal Formulation: With patient Time For Goal Achievement: 08/11/19 Potential to Achieve Goals: Good    Frequency    Min 3X/week      PT Plan Current plan remains appropriate    Co-evaluation              AM-PAC PT "6 Clicks" Mobility   Outcome Measure  Help needed turning from your back to your side while in a flat bed without using bedrails?: None Help needed moving from lying on your back to sitting on the side of a flat bed without using bedrails?: None Help needed moving to and from a bed to a  chair (including a wheelchair)?: A Little Help needed standing up from a chair using your arms (e.g., wheelchair or bedside chair)?: A Little Help needed to walk in hospital room?: A Little Help needed climbing 3-5 steps with a railing? : A Little 6 Click Score: 20    End of Session Equipment Utilized During Treatment: Gait belt;Oxygen Activity Tolerance: Patient tolerated treatment well Patient left: with call bell/phone within reach;in bed Nurse Communication: Mobility status(Needs O2 to take home, home O2 tank is empty.) PT Visit Diagnosis: Unsteadiness on feet (R26.81);Muscle weakness (generalized) (M62.81)     Time: 5284-1324 PT Time Calculation (min) (ACUTE ONLY): 27 min  Charges:  $Gait Training: 23-37 mins                     Benjiman Core, [place] Pager [phone] Acute Rehab   Allena Katz 07/29/2019, 11:18 AM

## 2019-07-29 NOTE — Plan of Care (Signed)
  Problem: Activity: Goal: Risk for activity intolerance will decrease Outcome: Progressing   Problem: Elimination: Goal: Will not experience complications related to bowel motility Outcome: Progressing Goal: Will not experience complications related to urinary retention Outcome: Progressing   Problem: Pain Managment: Goal: General experience of comfort will improve Outcome: Progressing

## 2019-07-29 NOTE — Plan of Care (Signed)
  Problem: Education: Goal: Knowledge of General Education information will improve Description: Including pain rating scale, medication(s)/side effects and non-pharmacologic comfort measures Outcome: Progressing   Problem: Activity: Goal: Risk for activity intolerance will decrease Outcome: Progressing   

## 2019-07-29 NOTE — Care Management (Addendum)
Called [facility]. They will have Laverda Sorenson NP page Dr Verlon Au to discuss medications.     Consult regarding home management of medications by [facility]. Spoke to Trixie Dredge , she will check patient's chart and call NCM back.Marland Kitchen   Magdalen Spatz RN [phone]

## 2019-07-29 NOTE — Progress Notes (Signed)
Hospitalist progress note  Emily Moran  JHE:[id number] DOB: Nov 16, 1955 DOA: 07/27/2019 PCP: Nolene Ebbs, MD  Narrative:  37 white female COPD 3 L oxygen baseline, HCV status post treatment Dr. Paulita Fujita, degenerative joint disease bipolar chronic neck/back pain on methadone admitted to hospital 10/19 with worsening shortness of breath weakness-CXR = pneumonia-Started azithromycin and changed to doxycycline Rocephin saline 75 Assessment & Plan: End-stage COPD- Do not think she has pneumonia-discontinue Rocephin-continue doxycycline at this time-monitor trend-continue chronic oxygen 3 L-ambulate Chronic pain syndrome Possible secondary gain with regards to getting meds for her chronic pain-we will cut back her methadone from 80 to 60 mg as there is some concern that hospice will not provide this if it is above 30 mg-this can be done slowly as an outpatient but she will probably need to have [facility] continue this until that time-social worker will be made aware of the same to help coordinate and consolidate her management Severe hypokalemia on admission Improved from [place] labs in a.m. start K. Dur 40 daily and monitor a.m. labs with magnesium HTN Continue amlodipine 5 mg daily Tobacco dependence Continue nicotine patch 14 mg every 24   DVT prophylaxis: Lovenox  Code Status:   Full   Family Communication:   None Disposition Plan: Inpatient  Consultants:   None Procedures:   None Antimicrobials:   Ceftriaxone through 10/21 Subjective: Feels okay no cough no cold no fever no chills no nausea no vomiting Relates that she has been on hospice for 2 years and has been followed by [facility] Gets her meds at [facility] It looks like looking back through her notes in the past she has been on this because she has been on heroin in the past Objective: Vitals:   07/28/19 1950 07/28/19 2057 07/29/19 0550 07/29/19 0825  BP:  (!) 139/96 120/79   Pulse:  65 82   Resp:  17  19   Temp:  98.1 F (36.7 C) 97.9 F (36.6 C)   TempSrc:  Oral Oral   SpO2: 93% 95% 98% 90%  Weight:      Height:        Intake/Output Summary (Last 24 hours) at 07/29/2019 0906 Last data filed at 07/29/2019 0500 Gross per 24 hour  Intake 2031.67 ml  Output 2150 ml  Net -118.33 ml   Filed Weights   07/27/19 1633  Weight: 55 kg    Examination: Awake cachectic white female no distress EOMI NCAT Chest clear without rales rhonchi  abdomen soft no rebound no guarding S1-S2 no murmur rub or gallop Neurologically intact no focal deficit   Data Reviewed: I have personally reviewed following labs and imaging studies CBC: Recent Labs  Lab 07/27/19 1230 07/28/19 0239  WBC 14.3* 13.9*  NEUTROABS  --  10.8*  HGB 11.5* 10.7*  HCT 39.3 36.1  MCV 96.6 95.0  PLT 411* 185   Basic Metabolic Panel: Recent Labs  Lab 07/27/19 1230 07/27/19 1922 07/28/19 0239 07/29/19 0808  NA 136  --  140 141  K 3.0* 2.8* 2.6* 3.3*  CL 94*  --  101 103  CO2 29  --  29 26  GLUCOSE 104*  --  75 111*  BUN 8  --  9 8  CREATININE 0.87  --  0.94 0.84  CALCIUM 8.8*  --  8.3* 8.6*  MG  --  2.2  --  1.7   GFR: Estimated Creatinine Clearance: 61.1 mL/min (by C-G formula based on SCr of 0.84 mg/dL). Liver Function Tests:  Recent Labs  Lab 07/29/19 0808  AST 14*  ALT 9  ALKPHOS 88  BILITOT 0.3  PROT 6.5  ALBUMIN 2.4*   No results for input(s): LIPASE, AMYLASE in the last 168 hours. No results for input(s): AMMONIA in the last 168 hours. Coagulation Profile: No results for input(s): INR, PROTIME in the last 168 hours. Cardiac Enzymes: Radiology Studies: Reviewed images personally in health database  Scheduled Meds: . amLODipine  5 mg Oral Daily  . doxycycline  100 mg Oral Q12H  . DULoxetine  120 mg Oral Daily  . enoxaparin (LOVENOX) injection  40 mg Subcutaneous Q24H  . feeding supplement  1 Container Oral TID BM  . gabapentin  800 mg Oral QID  . influenza vac split quadrivalent PF   0.5 mL Intramuscular Tomorrow-1000  . ipratropium  3 mL Inhalation Once  . ipratropium-albuterol  3 mL Inhalation TID  . LORazepam  0.5 mg Oral QID  . methadone  80 mg Oral Daily  . mometasone-formoterol  1 puff Inhalation BID  . multivitamin with minerals  1 tablet Oral Q24H  . nicotine  14 mg Transdermal Daily  . pneumococcal 23 valent vaccine  0.5 mL Intramuscular Tomorrow-1000  . predniSONE  10 mg Oral Q breakfast   Continuous Infusions: . sodium chloride 50 mL/hr at 07/27/19 1909  . cefTRIAXone (ROCEPHIN)  IV 2 g (07/28/19 1922)    LOS: 2 days   Time spent: [place], MD [facility]  If 7PM-7AM, please contact night-coverage-look on AMION to find my number otherwise-prefer pages-not epic chat,please 07/29/2019, 9:06 AM

## 2019-07-29 NOTE — Progress Notes (Signed)
Manufacturing engineer [facility]) Community Based Palliative Care     This patient is enrolled in our palliative care services in the community.  [facility] will continue to follow for any discharge planning needs and to coordinate continuation of palliative care.   If you have questions or need assistance please call [phone] or contact the hospital Liaison listed on AMION.   Farrel Gordon, RN, [facility] Liaison  [phone]

## 2019-07-30 ENCOUNTER — Emergency Department (HOSPITAL_COMMUNITY)
Admission: EM | Admit: 2019-07-30 | Discharge: 2019-07-30 | Discharge status: Against Medical Advice | Payer: Medicare Other | Source: Home / Self Care

## 2019-07-30 ENCOUNTER — Other Ambulatory Visit: Payer: Self-pay

## 2019-07-30 LAB — LEGIONELLA PNEUMOPHILA SEROGP 1 UR AG: L. pneumophila Serogp 1 Ur Ag: NEGATIVE

## 2019-07-30 LAB — RENAL FUNCTION PANEL
Albumin: 2.3 g/dL — ABNORMAL LOW (ref 3.5–5.0)
Anion gap: 10 (ref 5–15)
BUN: 9 mg/dL (ref 8–23)
CO2: 27 mmol/L (ref 22–32)
Calcium: 8.7 mg/dL — ABNORMAL LOW (ref 8.9–10.3)
Chloride: 103 mmol/L (ref 98–111)
Creatinine, Ser: 0.77 mg/dL (ref 0.44–1.00)
GFR calc Af Amer: >60 mL/min (ref >60)
GFR calc non Af Amer: >60 mL/min (ref >60)
Glucose, Bld: 82 mg/dL (ref 70–99)
Phosphorus: 3.5 mg/dL (ref 2.5–4.6)
Potassium: 3.4 mmol/L — ABNORMAL LOW (ref 3.5–5.1)
Sodium: 140 mmol/L (ref 135–145)

## 2019-07-30 LAB — MAGNESIUM: Magnesium: 1.9 mg/dL (ref 1.7–2.4)

## 2019-07-30 MED ORDER — NICOTINE 14 MG/24HR TD PT24: 14.0000 mg | MEDICATED_PATCH | Freq: Every day | TRANSDERMAL | 0 refills | Status: AC

## 2019-07-30 MED ORDER — NICOTINE 14 MG/24HR TD PT24: 14.0000 mg | MEDICATED_PATCH | Freq: Every day | TRANSDERMAL | 0 refills | Status: DC

## 2019-07-30 MED ORDER — DOXYCYCLINE HYCLATE 100 MG PO TABS: 100.0000 mg | ORAL_TABLET | Freq: Two times a day (BID) | ORAL | 0 refills | Status: DC

## 2019-07-30 MED FILL — NICOTINE 14 MG/24HR PATCH: 14 | 28 days supply | Qty: 28 | Fill #0

## 2019-07-30 MED FILL — DOXYCYCLINE HYCLATE 100 MG: 100 | 3 days supply | Qty: 6 | Fill #0

## 2019-07-30 NOTE — Progress Notes (Addendum)
Occupational Therapy Treatment Patient Details Name: Emily Moran MRN: [id number] DOB: [birth year] Today's Date: 07/30/2019    History of present illness Emily Moran is a 63 y.o. female with medical history significant of advanced COPD on 3L home O2 and enrolled in Hospice and chronic pain presenting with SOB.  She is on palliative care at home.  She used to be able to walk from her house to car and to go get methadone every morning at 5AM.  Now, she can barely make it to her car because her breathing has gotten so much worse.  She has had draining from her eyes and now with severe pain in her left lung.  She has end-stage COPD, not sure if she has PNA or approaching the very end.    OT comments  Session focus on energy conservation (EC) strategies to incorporate into ADL routine at home.  Pt tearful throughout sesison about not being able to go to ALF; provided support and education on EC strategies. Issued pt EC handout with pt verbalizing understanding of techniques. Provided education on uses of 3n1 and visually demo'ed use of 3n1 as shower chair. Updated DME rec; feel pt would benefit from use of 3n1 at to assist with safe functional transfers reducing risk of falls at home. Recommending HHOT at time of DC d/t pt not being eligible for ALF placement feel pt would need assist with BADLs d/t decreased activity tolerance and poor endurance for standing tolerance, will let OTR know about change in POC. Pt complete UB dressing in bed with set- up assist. Pt on 3L O2 via [place] with sats ranging from 89%-91% throughout session; pt reports that is her baseline. Will follow acutely per POC.   Follow Up Recommendations  Home health OT    Equipment Recommendations  3 in 1 bedside commode    Recommendations for Other Services      Precautions / Restrictions Precautions Precautions: Fall Restrictions Weight Bearing Restrictions: No       Mobility Bed Mobility Overal bed mobility: Independent              General bed mobility comments: taylor sitting up in bed upon OT arrival  Transfers Overall transfer level: Needs assistance Equipment used: None Transfers: Sit to/from Stand Sit to Stand: Supervision         General transfer comment: declined OOB transfer    Balance Overall balance assessment: Needs assistance Sitting-balance support: No upper extremity supported;Feet supported Sitting balance-Leahy Scale: Fair     Standing balance support: During functional activity;Single extremity supported;No upper extremity supported Standing balance-Leahy Scale: Fair Standing balance comment: Pt needs steadying assist to stand dynamically and min guard for static balance.                             ADL either performed or assessed with clinical judgement   ADL         Grooming Details (indicate cue type and reason): education on EC strategies to incorporate into ADL routine         Upper Body Dressing : Set up;Sitting         Toilet Transfer Details (indicate cue type and reason): discussed use of 3n1 over toilet with pt verbalizing understanding and requesting 3n1 at time of DC       Tub/Shower Transfer Details (indicate cue type and reason): discussed use of 3n1 as shower seat with pt verbalizing understanding and requesting  3n1 at time of DC   General ADL Comments: session focus on UB dressing and education on EC strategies     Vision Baseline Vision/History: No visual deficits Vision Assessment?: No apparent visual deficits   Perception     Praxis      Cognition Arousal/Alertness: Awake/alert Behavior During Therapy: WFL for tasks assessed/performed;Anxious Overall Cognitive Status: Within Functional Limits for tasks assessed                                 General Comments: pt very anxious and crying throughout about going home and being able to manage her O2; support provided        Exercises     Shoulder  Instructions       General Comments pt on 3L O2 with sats 89% sitting up in bed; sats remained in 80s most of session; left pt with sats 93%    Pertinent Vitals/ Pain       Pain Assessment: Faces Faces Pain Scale: Hurts a little bit Pain Location: chest pain; general discomfort Pain Descriptors / Indicators: Aching;Grimacing;Guarding;Discomfort;Crying Pain Intervention(s): Limited activity within patient's tolerance;Monitored during session  Home Living                                          Prior Functioning/Environment              Frequency  Min 2X/week        Progress Toward Goals  OT Goals(current goals can now be found in the care plan section)     Acute Rehab OT Goals Patient Stated Goal: find facility OT Goal Formulation: With patient Time For Goal Achievement: 08/11/19 Potential to Achieve Goals: Good  Plan Discharge plan needs to be updated    Co-evaluation                 AM-PAC OT "6 Clicks" Daily Activity     Outcome Measure   Help from another person eating meals?: A Little Help from another person taking care of personal grooming?: A Little Help from another person toileting, which includes using toliet, bedpan, or urinal?: A Little Help from another person bathing (including washing, rinsing, drying)?: A Little Help from another person to put on and taking off regular upper body clothing?: A Little Help from another person to put on and taking off regular lower body clothing?: A Little 6 Click Score: 18    End of Session Equipment Utilized During Treatment: Oxygen;Other (comment)(3L O2 via [place])  OT Visit Diagnosis: Other abnormalities of gait and mobility (R26.89);Other (comment)   Activity Tolerance Other (comment)(limited by general anxiety about her situation)   Patient Left in bed;with call bell/phone within reach;with nursing/sitter in room   Nurse Communication Mobility status;Other (comment)(asking about  how she will get home and where she will get her O2- worried about how she will get her car home as her car is here at the hospital)        Time: [phone] OT Time Calculation (min): 22 min  Charges: OT General Charges $OT Visit: 1 Visit OT Treatments $Self Care/Home Management : 8-22 mins  [place], [place] 07/30/2019, 10:43 AM

## 2019-07-30 NOTE — Progress Notes (Addendum)
Patient discharged to home. Verbalized understanding of all discharge instructions including activity, discharge medications and follow up MD visits. Patient transported off unit via [facility] at 1625. Patient refused to wait for corrected DNR form and left with [facility] without signed form. Patient aware that in event of cardiac arrest, all measures will be taken.

## 2019-07-30 NOTE — Progress Notes (Signed)
Reviewed discharge instructions with patient. No questions at this time.

## 2019-07-30 NOTE — TOC Initial Note (Addendum)
Transition of Care [facility]) - Initial/Assessment Note    Patient Details  Name: Emily Moran MRN: [id number] Date of Birth: [birth year]  [facility] of [facility]) CM/SW Contact:    Marilu Favre, RN Phone Number: 07/30/2019, 10:39 AM  Clinical Narrative:                 Patient from [place], address in Epic correct. Scripts were sent to [place] , patient wanting to use [facility] pharmacy asked MD to change scripts. Patient wanting [facility] transport home. She has an empty portable oxygen tank at bedside. Already has home oxygen with [place]. Concentrator at home is working . Called [facility] they will transport oxygen tank home with patient. Patient requesting 3 in1 , [facility] cannot transport 3 in1 home. Patient understands. 3 in1 will be shipped to her home. Zack with [employer] aware.  Once scripts filled will call [facility].    Asked MD for HHPT/OT orders and face to face . Dr Verlon Au does not feel that patient needs home health at this time.  Expected Discharge Plan: Home/Self Care Barriers to Discharge: No Barriers Identified   Patient Goals and CMS Choice Patient states their goals for this hospitalization and ongoing recovery are:: to go home CMS Medicare.gov Compare Post Acute Care list provided to:: Patient Choice offered to / list presented to : NA  Expected Discharge Plan and Services Expected Discharge Plan: Home/Self Care   Discharge Planning Services: CM Consult   Living arrangements for the past 2 months: Single Family Home Expected Discharge Date: 07/30/19               DME Arranged: 3-N-1         HH Arranged: NA HH Agency: NA        Prior Living Arrangements/Services Living arrangements for the past 2 months: Single Family Home Lives with:: Relatives Patient language and need for interpreter reviewed:: Yes Do you feel safe going back to the place where you live?: Yes      Need for Family Participation in Patient Care: Yes (Comment) Care giver support system in  place?: Yes (comment) Current home services: DME Criminal Activity/Legal Involvement Pertinent to Current Situation/Hospitalization: No - Comment as needed  Activities of Daily Living Home Assistive Devices/Equipment: Cane (specify quad or straight), Walker (specify type) ADL Screening (condition at time of admission) Patient's cognitive ability adequate to safely complete daily activities?: Yes Is the patient deaf or have difficulty hearing?: No Does the patient have difficulty seeing, even when wearing glasses/contacts?: No Does the patient have difficulty concentrating, remembering, or making decisions?: No Patient able to express need for assistance with ADLs?: No Does the patient have difficulty dressing or bathing?: No Independently performs ADLs?: Yes (appropriate for developmental age) Does the patient have difficulty walking or climbing stairs?: Yes Weakness of Legs: Both Weakness of Arms/Hands: None  Permission Sought/Granted   Permission granted to share information with : No              Emotional Assessment Appearance:: Appears older than stated age Attitude/Demeanor/Rapport: Engaged Affect (typically observed): Accepting Orientation: : Oriented to Self, Oriented to Place, Oriented to  Time, Oriented to Situation Alcohol / Substance Use: Not Applicable Psych Involvement: No (comment)  Admission diagnosis:  Hypokalemia [E87.6] COPD exacerbation (HCC) [J44.1] Multifocal pneumonia [J18.9] Chest pain, unspecified type [R07.9] Pneumonia [J18.9] Patient Active Problem List   Diagnosis Date Noted  . Multifocal pneumonia 07/27/2019  . Pneumonia 07/27/2019  . Grief 04/13/2019  . Physical  deconditioning 04/13/2019  . Protein-calorie malnutrition, severe 07/10/2017  . MRSA carrier 07/09/2017  . Anemia 07/08/2017  . Renal insufficiency 07/08/2017  . Ectatic aorta ([place]) 07/08/2017  . COPD exacerbation ([place]) 07/19/2016  . Lung nodule 07/19/2016  . Septic shock ([place])  04/05/2016  . HCAP (healthcare-associated pneumonia) 04/05/2016  . CAP (community acquired pneumonia) 03/07/2016  . Acute respiratory failure with hypoxia ([place]) 03/16/2015  . COPD (chronic obstructive pulmonary disease) ([place]) 03/16/2015  . HTN (hypertension) 03/16/2015  . Leukocytosis 03/16/2015  . Hyperglycemia 03/16/2015  . Chronic pain syndrome 03/16/2015  . Hepatitis C 12/18/2007  . TOBACCO ABUSE 09/11/2007  . Substance abuse ([place]) 09/11/2007   PCP:  Nolene Ebbs, MD Pharmacy:   [id number] [place], [address] [place] [address] Phone: [phone] Fax: [place], [place] [address] Phone: [phone] Fax: [place], [place] [address] [place] [address] Phone: [phone] Fax: [phone]     Social Determinants of Health (SDOH) Interventions    Readmission Risk Interventions No flowsheet data found.

## 2019-07-30 NOTE — Progress Notes (Signed)
Physical Therapy Treatment Patient Details Name: RAY GLACKEN MRN: [id number] DOB: [birth year] Today's Date: 07/30/2019    History of Present Illness Elfida L Delossantos is a 63 y.o. female with medical history significant of advanced COPD on 3L home O2 and enrolled in Hospice and chronic pain presenting with SOB.  She is on palliative care at home.  She used to be able to walk from her house to car and to go get methadone every morning at 5AM.  Now, she can barely make it to her car because her breathing has gotten so much worse.  She has had draining from her eyes and now with severe pain in her left lung.  She has end-stage COPD, not sure if she has PNA or approaching the very end.     PT Comments    Pt admitted with above diagnosis. Pt was able to ambulate in room with RW to window and back to bed.  Pt desat to 61% on 6L with walk and sat returned to 87% after 2 min rest in bed with 3LO2 at rest.  Recommend pulse oximeter and still feel that ALF or SNF is indicated for this pt as she needs someone to supervise her for safety.  Pt currently with functional limitations due to balance and endurance deficits. Pt will benefit from skilled PT to increase their independence and safety with mobility to allow discharge to the venue listed below.     Follow Up Recommendations  Home health PT;Supervision - Intermittent;Other (comment)(Assisted living vs SNF)     Equipment Recommendations  (rollator, pulse oximeter), pulse oximeter   Recommendations for Other Services       Precautions / Restrictions Precautions Precautions: Fall Restrictions Weight Bearing Restrictions: No    Mobility  Bed Mobility Overal bed mobility: Independent                Transfers Overall transfer level: Needs assistance Equipment used: None Transfers: Sit to/from Stand Sit to Stand: Supervision         General transfer comment: no LOB, cues for hand placement  Ambulation/Gait Ambulation/Gait  assistance: Min guard Gait Distance (Feet): 50 Feet Assistive device: Rolling walker (2 wheeled) Gait Pattern/deviations: Step-through pattern;Decreased stride length;Drifts right/left;Trunk flexed   Gait velocity interpretation: <1.31 ft/sec, indicative of household ambulator General Gait Details: Cues for sequencing and technique with RW. Desat to 61% per monitor on 6L with activity.  Baseline at rest on 3L 86-87%.  Encouraged pursed lip breathing.   Stairs             Wheelchair Mobility    Modified Rankin (Stroke Patients Only)       Balance Overall balance assessment: Needs assistance Sitting-balance support: No upper extremity supported;Feet supported Sitting balance-Leahy Scale: Fair     Standing balance support: During functional activity;Single extremity supported;No upper extremity supported Standing balance-Leahy Scale: Fair Standing balance comment: Pt needs steadying assist to stand dynamically and min guard for static balance.                              Cognition Arousal/Alertness: Awake/alert Behavior During Therapy: WFL for tasks assessed/performed Overall Cognitive Status: Within Functional Limits for tasks assessed                                        Exercises  General Comments        Pertinent Vitals/Pain Pain Assessment: Faces Faces Pain Scale: Hurts even more Pain Location: chest area after walk that subsided quickly Pain Descriptors / Indicators: Aching;Grimacing;Guarding Pain Intervention(s): Limited activity within patient's tolerance;Monitored during session;Repositioned    Home Living                      Prior Function            PT Goals (current goals can now be found in the care plan section) Acute Rehab PT Goals Patient Stated Goal: find facility Progress towards PT goals: Progressing toward goals    Frequency    Min 3X/week      PT Plan Current plan remains  appropriate    Co-evaluation              AM-PAC PT "6 Clicks" Mobility   Outcome Measure  Help needed turning from your back to your side while in a flat bed without using bedrails?: None Help needed moving from lying on your back to sitting on the side of a flat bed without using bedrails?: None Help needed moving to and from a bed to a chair (including a wheelchair)?: A Little Help needed standing up from a chair using your arms (e.g., wheelchair or bedside chair)?: A Little Help needed to walk in hospital room?: A Little Help needed climbing 3-5 steps with a railing? : A Little 6 Click Score: 20    End of Session Equipment Utilized During Treatment: Gait belt;Oxygen Activity Tolerance: Patient limited by fatigue Patient left: with call bell/phone within reach;in bed Nurse Communication: Mobility status(Needs O2 to take home, home O2 tank is empty.) PT Visit Diagnosis: Unsteadiness on feet (R26.81);Muscle weakness (generalized) (M62.81)     Time: 5916-3846 PT Time Calculation (min) (ACUTE ONLY): 14 min  Charges:  $Gait Training: 8-22 mins                     [place] Pager:  [phone]  Office:  [place] 07/30/2019, 9:50 AM

## 2019-07-30 NOTE — Discharge Summary (Signed)
Physician Discharge Summary  Emily Moran HQI:[id number] DOB: 11-24-1955 DOA: 07/27/2019  PCP: Nolene Ebbs, MD  Admit date: 07/27/2019 Discharge date: 07/30/2019  Time spent: 35 minutes  Recommendations for Outpatient Follow-up:  1. Complete doxycycline course 2. Needs outpatient coordination with methadone clinic/[facility] and coordination with hospice with regarding today de-escalating and coming down off that dose 3. Prescribed oxygen in addition to nicotine patch this admission 4. Note numerous supernumerary medications Cymbalta/tizanidine-take  Discharge Diagnoses:  Principal Problem:   Multifocal pneumonia Active Problems:   TOBACCO ABUSE   COPD (chronic obstructive pulmonary disease) (HCC)   HTN (hypertension)   Chronic pain syndrome   Pneumonia   Discharge Condition: Improved  Diet recommendation: Heart healthy  Filed Weights   07/27/19 1633  Weight: 55 kg    History of present illness:  60 white female COPD 3 L oxygen baseline, HCV status post treatment Dr. Paulita Fujita, degenerative joint disease bipolar chronic neck/back pain on methadone admitted to hospital 10/19 with worsening shortness of breath weakness-CXR = pneumonia-Started azithromycin and changed to doxycycline Rocephin saline 75  Hospital Course:  End-stage COPD- Do not think she has pneumonia-discontinue Rocephin-continue doxycycline at this time-monitor trend-continue chronic oxygen 3 L-ambulate Chronic pain syndrome Possible secondary gain with regards to getting meds for her chronic pain-we discussed cutting back her methadone dose however I was not able to coordinate with her primary care physician who was notified and this will have to be done as an outpatient in terms of ensuring that she is able to get the correct dose of her meds coordinate and consolidate her management Severe hypokalemia on admission Improved from [facility] labs in a.m. start K. Dur 40 daily and monitor a.m. labs  with magnesium HTN Continue amlodipine 5 mg daily Tobacco dependence Continue nicotine patch 14 mg every 24   Procedures:  X-ray none  Consultations:  None  Discharge Exam: Vitals:   07/29/19 2137 07/30/19 0456  BP: (!) 142/91 120/83  Pulse: 93 80  Resp: 15 15  Temp: 98.6 F (37 C) 98.5 F (36.9 C)  SpO2: 93% 91%    General: Awake alert some distress otherwise doing well cardiorespiratory wise mainly some pain Cardiovascular: S1-S2 no murmur rub or gallop Respiratory: No wheeze no rales no rhonchi No crackles Lower extremities soft nontender  Discharge Instructions   Discharge Instructions    Diet - low sodium heart healthy   Complete by: As directed    Discharge instructions   Complete by: As directed    Please follow-up with your outpatient physician who prescribes your methadone you will need to continue to get your prescription from him we have not change the dose Please complete your course of doxycycline for COPD flare that we have given. I would recommend that you continue your other medications-we have also started you on nicotine patch and hopefully this will help you quit smoking We have prescribed oxygen so that you will be able to get a tank on discharge   Increase activity slowly   Complete by: As directed      Allergies as of 07/30/2019      Reactions   Aspirin    Upset stomach      Medication List    TAKE these medications   albuterol (2.5 MG/3ML) 0.083% nebulizer solution Commonly known as: PROVENTIL Take 3 mLs (2.5 mg total) by nebulization every 6 (six) hours as needed for wheezing or shortness of breath (buy this only if you cannot afford the Duoneb).   albuterol  108 (90 Base) MCG/ACT inhaler Commonly known as: VENTOLIN HFA Inhale 2 puffs into the lungs every 6 (six) hours as needed for wheezing or shortness of breath.   amLODipine 5 MG tablet Commonly known as: NORVASC Take 1 tablet (5 mg total) by mouth daily.   budesonide  0.5 MG/2ML nebulizer solution Commonly known as: Pulmicort Take 2 mLs (0.5 mg total) by nebulization 2 (two) times daily. Please run through Medicare Dx J44.9   doxycycline 100 MG tablet Commonly known as: VIBRA-TABS Take 1 tablet (100 mg total) by mouth every 12 (twelve) hours.   feeding supplement (ENSURE ENLIVE) Liqd Take 237 mLs by mouth 3 (three) times daily between meals.   gabapentin 800 MG tablet Commonly known as: NEURONTIN Take 800 mg by mouth 4 (four) times daily.   methadone 10 MG/5ML solution Commonly known as: DOLOPHINE Take 80 mg by mouth daily.   Nebulizer Compressor Misc Use as directed   nicotine 14 mg/24hr patch Commonly known as: NICODERM CQ - dosed in mg/24 hours Place 1 patch (14 mg total) onto the skin daily.   Perforomist 20 MCG/2ML nebulizer solution Generic drug: formoterol Take 2 mLs (20 mcg total) by nebulization 2 (two) times daily. Please run through Medicare Dx J44.9   predniSONE 10 MG tablet Commonly known as: DELTASONE Take 10 mg by mouth daily with breakfast.   sertraline 100 MG tablet Commonly known as: ZOLOFT Take 100 mg by mouth daily.            Durable Medical Equipment  (From admission, onward)         Start     Ordered   07/30/19 0819  DME Oxygen  Once    Question Answer Comment  Length of Need Lifetime   Mode or (Route) Nasal cannula   Liters per Minute 3   Frequency Continuous (stationary and portable oxygen unit needed)   Oxygen conserving device No   Oxygen delivery system Gas      07/30/19 0820         Allergies  Allergen Reactions  . Aspirin     Upset stomach      The results of significant diagnostics from this hospitalization (including imaging, microbiology, ancillary and laboratory) are listed below for reference.    Significant Diagnostic Studies: Dg Chest 2 View  Result Date: 07/27/2019 CLINICAL DATA:  Chest pain, shortness of breath. EXAM: CHEST - 2 VIEW COMPARISON:  12/05/2018 FINDINGS:  Interstitial and airspace opacity superimposed on background emphysema in the left lung base and lingula obscuring the left cardiac border. To a lesser extent this is seen also on the right. Large bullous changes in the right upper lobe are similar to the previous study. Cardiomediastinal contours are stable. Signs of cervical spinal fusion are partially imaged. Lungs remain hyperinflated. IMPRESSION: 1. Findings are most consistent with multifocal pneumonia superimposed on background emphysema. Recommend follow-up to resolution. 2. Stable bullous changes in the right upper lobe. Electronically Signed   By: Zetta Bills M.D.   On: 07/27/2019 12:44    Microbiology: Recent Results (from the past 240 hour(s))  SARS CORONAVIRUS 2 (TAT 6-24 HRS) Nasopharyngeal Nasopharyngeal Swab     Status: None   Collection Time: 07/27/19  1:22 PM   Specimen: Nasopharyngeal Swab  Result Value Ref Range Status   SARS Coronavirus 2 NEGATIVE NEGATIVE Final    Comment: (NOTE) SARS-CoV-2 target nucleic acids are NOT DETECTED. The SARS-CoV-2 RNA is generally detectable in upper and lower respiratory specimens during the acute  phase of infection. Negative results do not preclude SARS-CoV-2 infection, do not rule out co-infections with other pathogens, and should not be used as the sole basis for treatment or other patient management decisions. Negative results must be combined with clinical observations, patient history, and epidemiological information. The expected result is Negative. Fact Sheet for Patients: SugarRoll.be Fact Sheet for Healthcare Providers: https://www.woods-mathews.com/ This test is not yet approved or cleared by the [place] FDA and  has been authorized for detection and/or diagnosis of SARS-CoV-2 by FDA under an Emergency Use Authorization (EUA). This EUA will remain  in effect (meaning this test can be used) for the duration of the COVID-19  declaration under Section 56 4(b)(1) of the Act, 21 U.S.C. section 360bbb-3(b)(1), unless the authorization is terminated or revoked sooner. Performed at [facility], [place] [address], [place], [place] [address]   Blood culture (routine x 2)     Status: None (Preliminary result)   Collection Time: 07/27/19  3:55 PM   Specimen: BLOOD  Result Value Ref Range Status   Specimen Description BLOOD RIGHT UPPER ARM  Final   Special Requests   Final    BOTTLES DRAWN AEROBIC ONLY Blood Culture results may not be optimal due to an inadequate volume of blood received in culture bottles   Culture   Final    NO GROWTH 3 DAYS Performed at [facility], [place] [address], [place], [place] [address]    Report Status PENDING  Incomplete  Blood culture (routine x 2)     Status: None (Preliminary result)   Collection Time: 07/27/19  4:21 PM   Specimen: BLOOD LEFT FOREARM  Result Value Ref Range Status   Specimen Description BLOOD LEFT FOREARM  Final   Special Requests   Final    BOTTLES DRAWN AEROBIC AND ANAEROBIC Blood Culture adequate volume   Culture   Final    NO GROWTH 3 DAYS Performed at [facility], [place] [address], [place], [place] [address]    Report Status PENDING  Incomplete     Labs: Basic Metabolic Panel: Recent Labs  Lab 07/27/19 1230 07/27/19 1922 07/28/19 0239 07/29/19 0808 07/30/19 0135  NA 136  --  140 141 140  K 3.0* 2.8* 2.6* 3.3* 3.4*  CL 94*  --  101 103 103  CO2 29  --  29 26 27   GLUCOSE 104*  --  75 111* 82  BUN 8  --  9 8 9   CREATININE 0.87  --  0.94 0.84 0.77  CALCIUM 8.8*  --  8.3* 8.6* 8.7*  MG  --  2.2  --  1.7 1.9  PHOS  --   --   --   --  3.5   Liver Function Tests: Recent Labs  Lab 07/29/19 0808 07/30/19 0135  AST 14*  --   ALT 9  --   ALKPHOS 88  --   BILITOT 0.3  --   PROT 6.5  --   ALBUMIN 2.4* 2.3*   No results for input(s): LIPASE, AMYLASE in the last 168 hours. No results for input(s): AMMONIA in the last 168  hours. CBC: Recent Labs  Lab 07/27/19 1230 07/28/19 0239  WBC 14.3* 13.9*  NEUTROABS  --  10.8*  HGB 11.5* 10.7*  HCT 39.3 36.1  MCV 96.6 95.0  PLT 411* 325   Cardiac Enzymes: No results for input(s): CKTOTAL, CKMB, CKMBINDEX, TROPONINI in the last 168 hours. BNP: BNP (last 3 results) No  results for input(s): BNP in the last 8760 hours.  ProBNP (last 3 results) No results for input(s): PROBNP in the last 8760 hours.  CBG: No results for input(s): GLUCAP in the last 168 hours.     Signed:  Nita Sells MD   [facility] 07/30/2019, 8:22 AM

## 2019-07-31 ENCOUNTER — Encounter (HOSPITAL_COMMUNITY): Payer: Self-pay | Admitting: Emergency Medicine

## 2019-07-31 ENCOUNTER — Other Ambulatory Visit: Payer: Self-pay

## 2019-07-31 ENCOUNTER — Emergency Department (HOSPITAL_COMMUNITY): Payer: Medicare Other

## 2019-07-31 ENCOUNTER — Telehealth: Payer: Self-pay

## 2019-07-31 ENCOUNTER — Inpatient Hospital Stay (HOSPITAL_COMMUNITY)
Admission: EM | Admit: 2019-07-31 | Discharge: 2019-08-05 | DRG: 180 | Disposition: A | Discharge status: Hospice | Payer: Medicare Other | Attending: Internal Medicine | Admitting: Internal Medicine

## 2019-07-31 DIAGNOSIS — Z981 Arthrodesis status: Secondary | ICD-10-CM

## 2019-07-31 DIAGNOSIS — R918 Other nonspecific abnormal finding of lung field: Secondary | ICD-10-CM | POA: Diagnosis not present

## 2019-07-31 DIAGNOSIS — R0602 Shortness of breath: Secondary | ICD-10-CM | POA: Diagnosis present

## 2019-07-31 DIAGNOSIS — Z87891 Personal history of nicotine dependence: Secondary | ICD-10-CM

## 2019-07-31 DIAGNOSIS — Z681 Body mass index (BMI) 19 or less, adult: Secondary | ICD-10-CM

## 2019-07-31 DIAGNOSIS — E43 Unspecified severe protein-calorie malnutrition: Secondary | ICD-10-CM | POA: Diagnosis present

## 2019-07-31 DIAGNOSIS — Z9981 Dependence on supplemental oxygen: Secondary | ICD-10-CM

## 2019-07-31 DIAGNOSIS — Z96641 Presence of right artificial hip joint: Secondary | ICD-10-CM | POA: Diagnosis present

## 2019-07-31 DIAGNOSIS — Z7189 Other specified counseling: Secondary | ICD-10-CM

## 2019-07-31 DIAGNOSIS — Z66 Do not resuscitate: Secondary | ICD-10-CM | POA: Diagnosis present

## 2019-07-31 DIAGNOSIS — Z7951 Long term (current) use of inhaled steroids: Secondary | ICD-10-CM

## 2019-07-31 DIAGNOSIS — T380X5A Adverse effect of glucocorticoids and synthetic analogues, initial encounter: Secondary | ICD-10-CM | POA: Diagnosis not present

## 2019-07-31 DIAGNOSIS — M5136 Other intervertebral disc degeneration, lumbar region: Secondary | ICD-10-CM | POA: Diagnosis present

## 2019-07-31 DIAGNOSIS — C3490 Malignant neoplasm of unspecified part of unspecified bronchus or lung: Secondary | ICD-10-CM | POA: Diagnosis present

## 2019-07-31 DIAGNOSIS — Z7952 Long term (current) use of systemic steroids: Secondary | ICD-10-CM

## 2019-07-31 DIAGNOSIS — Z515 Encounter for palliative care: Secondary | ICD-10-CM

## 2019-07-31 DIAGNOSIS — G894 Chronic pain syndrome: Secondary | ICD-10-CM | POA: Diagnosis present

## 2019-07-31 DIAGNOSIS — I10 Essential (primary) hypertension: Secondary | ICD-10-CM | POA: Diagnosis present

## 2019-07-31 DIAGNOSIS — Z823 Family history of stroke: Secondary | ICD-10-CM

## 2019-07-31 DIAGNOSIS — C3432 Malignant neoplasm of lower lobe, left bronchus or lung: Secondary | ICD-10-CM | POA: Diagnosis not present

## 2019-07-31 DIAGNOSIS — Z809 Family history of malignant neoplasm, unspecified: Secondary | ICD-10-CM

## 2019-07-31 DIAGNOSIS — J439 Emphysema, unspecified: Secondary | ICD-10-CM | POA: Diagnosis present

## 2019-07-31 DIAGNOSIS — F17200 Nicotine dependence, unspecified, uncomplicated: Secondary | ICD-10-CM | POA: Diagnosis present

## 2019-07-31 DIAGNOSIS — F319 Bipolar disorder, unspecified: Secondary | ICD-10-CM | POA: Diagnosis present

## 2019-07-31 DIAGNOSIS — J9621 Acute and chronic respiratory failure with hypoxia: Principal | ICD-10-CM

## 2019-07-31 DIAGNOSIS — Z79891 Long term (current) use of opiate analgesic: Secondary | ICD-10-CM

## 2019-07-31 DIAGNOSIS — F419 Anxiety disorder, unspecified: Secondary | ICD-10-CM | POA: Diagnosis present

## 2019-07-31 DIAGNOSIS — R5381 Other malaise: Secondary | ICD-10-CM | POA: Diagnosis present

## 2019-07-31 DIAGNOSIS — Z20828 Contact with and (suspected) exposure to other viral communicable diseases: Secondary | ICD-10-CM | POA: Diagnosis present

## 2019-07-31 DIAGNOSIS — R52 Pain, unspecified: Secondary | ICD-10-CM

## 2019-07-31 DIAGNOSIS — M549 Dorsalgia, unspecified: Secondary | ICD-10-CM | POA: Diagnosis present

## 2019-07-31 DIAGNOSIS — Z8249 Family history of ischemic heart disease and other diseases of the circulatory system: Secondary | ICD-10-CM

## 2019-07-31 DIAGNOSIS — Z886 Allergy status to analgesic agent status: Secondary | ICD-10-CM

## 2019-07-31 DIAGNOSIS — R64 Cachexia: Secondary | ICD-10-CM | POA: Diagnosis present

## 2019-07-31 DIAGNOSIS — J449 Chronic obstructive pulmonary disease, unspecified: Secondary | ICD-10-CM

## 2019-07-31 DIAGNOSIS — Z825 Family history of asthma and other chronic lower respiratory diseases: Secondary | ICD-10-CM

## 2019-07-31 LAB — CBC
HCT: 38.1 % (ref 36.0–46.0)
Hemoglobin: 11.4 g/dL — ABNORMAL LOW (ref 12.0–15.0)
MCH: 28.6 pg (ref 26.0–34.0)
MCHC: 29.9 g/dL — ABNORMAL LOW (ref 30.0–36.0)
MCV: 95.5 fL (ref 80.0–100.0)
Platelets: 392 10*3/uL (ref 150–400)
RBC: 3.99 MIL/uL (ref 3.87–5.11)
RDW: 14.9 % (ref 11.5–15.5)
WBC: 16.4 10*3/uL — ABNORMAL HIGH (ref 4.0–10.5)
nRBC: 0 % (ref 0.0–0.2)

## 2019-07-31 LAB — TROPONIN I (HIGH SENSITIVITY)
Troponin I (High Sensitivity): 13 ng/L (ref <18)
Troponin I (High Sensitivity): 13 ng/L (ref <18)

## 2019-07-31 LAB — BASIC METABOLIC PANEL
Anion gap: 12 (ref 5–15)
BUN: 20 mg/dL (ref 8–23)
CO2: 26 mmol/L (ref 22–32)
Calcium: 9.3 mg/dL (ref 8.9–10.3)
Chloride: 102 mmol/L (ref 98–111)
Creatinine, Ser: 0.93 mg/dL (ref 0.44–1.00)
GFR calc Af Amer: >60 mL/min (ref >60)
GFR calc non Af Amer: >60 mL/min (ref >60)
Glucose, Bld: 161 mg/dL — ABNORMAL HIGH (ref 70–99)
Potassium: 4.1 mmol/L (ref 3.5–5.1)
Sodium: 140 mmol/L (ref 135–145)

## 2019-07-31 MED ORDER — ONDANSETRON HCL 4 MG/2ML IJ SOLN
4.0000 mg | Freq: Once | INTRAMUSCULAR | Status: AC
  Administered 2019-07-31: 4 mg via INTRAVENOUS
  Filled 2019-07-31: qty 2

## 2019-07-31 MED ORDER — PREDNISONE 10 MG PO TABS
10.0000 mg | ORAL_TABLET | Freq: Every day | ORAL | Status: DC
  Administered 2019-08-01 – 2019-08-05 (×5): 10 mg via ORAL
  Filled 2019-07-31 (×5): qty 1

## 2019-07-31 MED ORDER — DOXYCYCLINE HYCLATE 100 MG PO TABS
100.0000 mg | ORAL_TABLET | Freq: Two times a day (BID) | ORAL | Status: DC
  Administered 2019-08-01 – 2019-08-05 (×10): 100 mg via ORAL
  Filled 2019-07-31 (×11): qty 1

## 2019-07-31 MED ORDER — AMLODIPINE BESYLATE 5 MG PO TABS
5.0000 mg | ORAL_TABLET | Freq: Every day | ORAL | Status: DC
  Administered 2019-07-31 – 2019-08-01 (×2): 5 mg via ORAL
  Filled 2019-07-31 (×3): qty 1

## 2019-07-31 MED ORDER — GABAPENTIN 400 MG PO CAPS
800.0000 mg | ORAL_CAPSULE | Freq: Four times a day (QID) | ORAL | Status: DC
  Administered 2019-07-31 – 2019-08-05 (×21): 800 mg via ORAL
  Filled 2019-07-31 (×21): qty 2

## 2019-07-31 MED ORDER — IPRATROPIUM-ALBUTEROL 0.5-2.5 (3) MG/3ML IN SOLN
3.0000 mL | Freq: Once | RESPIRATORY_TRACT | Status: AC
  Administered 2019-07-31: 22:00:00 3 mL via RESPIRATORY_TRACT
  Filled 2019-07-31: qty 3

## 2019-07-31 MED ORDER — ACETAMINOPHEN 650 MG RE SUPP: 650.0000 mg | Freq: Four times a day (QID) | RECTAL | Status: DC | PRN

## 2019-07-31 MED ORDER — GABAPENTIN 800 MG PO TABS
800.0000 mg | ORAL_TABLET | Freq: Four times a day (QID) | ORAL | Status: DC
  Filled 2019-07-31: qty 1

## 2019-07-31 MED ORDER — HYDRALAZINE HCL 20 MG/ML IJ SOLN: 5.0000 mg | INTRAMUSCULAR | Status: DC | PRN

## 2019-07-31 MED ORDER — METHADONE HCL 10 MG PO TABS
80.0000 mg | ORAL_TABLET | Freq: Once | ORAL | Status: AC
  Administered 2019-07-31: 19:00:00 80 mg via ORAL
  Filled 2019-07-31: qty 8

## 2019-07-31 MED ORDER — SERTRALINE HCL 100 MG PO TABS
100.0000 mg | ORAL_TABLET | Freq: Every day | ORAL | Status: DC
  Administered 2019-07-31 – 2019-08-05 (×6): 100 mg via ORAL
  Filled 2019-07-31 (×7): qty 1

## 2019-07-31 MED ORDER — ONDANSETRON HCL 4 MG/2ML IJ SOLN
4.0000 mg | Freq: Once | INTRAMUSCULAR | Status: AC
  Administered 2019-07-31: 16:00:00 4 mg via INTRAVENOUS
  Filled 2019-07-31: qty 2

## 2019-07-31 MED ORDER — NICOTINE 14 MG/24HR TD PT24
14.0000 mg | MEDICATED_PATCH | Freq: Every day | TRANSDERMAL | Status: DC
  Administered 2019-07-31 – 2019-08-05 (×6): 14 mg via TRANSDERMAL
  Filled 2019-07-31 (×6): qty 1

## 2019-07-31 MED ORDER — IOHEXOL 300 MG/ML  SOLN
75.0000 mL | Freq: Once | INTRAMUSCULAR | Status: AC | PRN
  Administered 2019-07-31: 17:00:00 75 mL via INTRAVENOUS

## 2019-07-31 MED ORDER — OXYCODONE-ACETAMINOPHEN 5-325 MG PO TABS
1.0000 | ORAL_TABLET | Freq: Once | ORAL | Status: AC
  Administered 2019-07-31: 15:00:00 1 via ORAL
  Filled 2019-07-31: qty 1

## 2019-07-31 MED ORDER — SODIUM CHLORIDE 0.9 % IV SOLN
INTRAVENOUS | Status: DC
  Administered 2019-08-01: 02:00:00 via INTRAVENOUS

## 2019-07-31 MED ORDER — METHADONE HCL 10 MG/ML PO CONC
80.0000 mg | Freq: Once | ORAL | Status: DC
  Filled 2019-07-31: qty 8

## 2019-07-31 MED ORDER — IPRATROPIUM-ALBUTEROL 20-100 MCG/ACT IN AERS
1.0000 | INHALATION_SPRAY | Freq: Four times a day (QID) | RESPIRATORY_TRACT | Status: DC
  Filled 2019-07-31: qty 4

## 2019-07-31 MED ORDER — HEPARIN SODIUM (PORCINE) 5000 UNIT/ML IJ SOLN
5000.0000 [IU] | Freq: Three times a day (TID) | INTRAMUSCULAR | Status: DC
  Administered 2019-07-31 – 2019-08-05 (×14): 5000 [IU] via SUBCUTANEOUS
  Filled 2019-07-31 (×14): qty 1

## 2019-07-31 MED ORDER — BUDESONIDE 0.5 MG/2ML IN SUSP
0.5000 mg | Freq: Two times a day (BID) | RESPIRATORY_TRACT | Status: DC
  Administered 2019-07-31 – 2019-08-04 (×4): 0.5 mg via RESPIRATORY_TRACT
  Filled 2019-07-31 (×10): qty 2

## 2019-07-31 MED ORDER — PREDNISONE 20 MG PO TABS
10.0000 mg | ORAL_TABLET | Freq: Once | ORAL | Status: AC
  Administered 2019-07-31: 15:00:00 10 mg via ORAL
  Filled 2019-07-31: qty 1

## 2019-07-31 MED ORDER — METHADONE HCL 10 MG PO TABS
80.0000 mg | ORAL_TABLET | Freq: Every day | ORAL | Status: DC
  Administered 2019-08-01 – 2019-08-05 (×5): 80 mg via ORAL
  Filled 2019-07-31 (×5): qty 8

## 2019-07-31 MED ORDER — LORAZEPAM 2 MG/ML IJ SOLN
1.0000 mg | INTRAMUSCULAR | Status: AC
  Administered 2019-07-31: 23:00:00 1 mg via INTRAVENOUS
  Filled 2019-07-31: qty 1

## 2019-07-31 MED ORDER — DOXYCYCLINE HYCLATE 100 MG PO TABS
100.0000 mg | ORAL_TABLET | Freq: Once | ORAL | Status: AC
  Administered 2019-07-31: 15:00:00 100 mg via ORAL
  Filled 2019-07-31: qty 1

## 2019-07-31 MED ORDER — ALBUTEROL SULFATE HFA 108 (90 BASE) MCG/ACT IN AERS
8.0000 | INHALATION_SPRAY | Freq: Once | RESPIRATORY_TRACT | Status: AC
  Administered 2019-07-31: 15:00:00 8 via RESPIRATORY_TRACT
  Filled 2019-07-31: qty 6.7

## 2019-07-31 MED ORDER — ALBUTEROL SULFATE (2.5 MG/3ML) 0.083% IN NEBU: 2.5000 mg | INHALATION_SOLUTION | Freq: Four times a day (QID) | RESPIRATORY_TRACT | Status: DC | PRN

## 2019-07-31 MED ORDER — ACETAMINOPHEN 325 MG PO TABS
650.0000 mg | ORAL_TABLET | Freq: Four times a day (QID) | ORAL | Status: DC | PRN
  Administered 2019-08-04 – 2019-08-05 (×3): 650 mg via ORAL
  Filled 2019-07-31 (×3): qty 2

## 2019-07-31 MED ORDER — ENSURE ENLIVE PO LIQD
237.0000 mL | Freq: Three times a day (TID) | ORAL | Status: DC
  Administered 2019-08-01 – 2019-08-05 (×8): 237 mL via ORAL

## 2019-07-31 NOTE — Telephone Encounter (Signed)
Phone call placed to patient's daughter to follow up with patient regarding recent d/c from hospital. Daughter shared that patient is back in ED this morning due to increased weakness and confusion. Daughter unsure if it something acute or if there is disease progression. Daughter is interested in having patient transitioned to hospice services if appropriate. Hospital Liaison team updated

## 2019-07-31 NOTE — ED Provider Notes (Signed)
Care assumed from Gwynn at shift change, please see her note for full details, but in brief Emily Moran is a 63 y.o. female with severe chronic COPD on 3 L nasal cannula at baseline, on palliative care for COPD, who was discharged from the hospital yesterday after admission for COPD exacerbation and multifocal pneumonia.  Discharged home on antibiotics but her daughter who typically helps care for her is unable to be with her due to another ill family member and patient is unable to drive, could not pick up any of her medications, and returns today with worsening shortness of breath and generalized weakness.  Requiring 4 L nasal cannula now for O2 sats ranging between 88-92%.  Lab work on arrival shows leukocytosis of 16.4 but otherwise labs at baseline.  Chest x-ray shows possible worsening multifocal pneumonia, plan for chest CT for better characterization.  Patient receiving prednisone, albuterol, doxycycline and pain medication.  Suspect patient will require admission versus placement.  Palliative care consult placed by previous care team, they will plan to see patient either in the ED or once admitted.  Physical Exam  BP (!) 126/93   Pulse 90   Temp (!) 97.3 F (36.3 C) (Oral)   Resp 13   SpO2 (!) 87%   Physical Exam Vitals signs and nursing note reviewed.  Constitutional:      General: She is not in acute distress.    Appearance: She is well-developed. She is not diaphoretic.     Comments: Chronically ill-appearing but in no acute distress  HENT:     Head: Normocephalic and atraumatic.  Eyes:     General:        Right eye: No discharge.        Left eye: No discharge.  Pulmonary:     Effort: Pulmonary effort is normal. No respiratory distress.     Comments: On 4 L nasal cannula O2 saturations between 88-92%, some wheezes and decreased air movement, but no respiratory distress Skin:    General: Skin is warm and dry.  Neurological:     Mental Status: She is alert and  oriented to person, place, and time.     Coordination: Coordination normal.  Psychiatric:        Mood and Affect: Mood normal.        Behavior: Behavior normal.     ED Course/Procedures   Labs Reviewed  CBC - Abnormal; Notable for the following components:      Result Value   WBC 16.4 (*)    Hemoglobin 11.4 (*)    MCHC 29.9 (*)    All other components within normal limits  BASIC METABOLIC PANEL - Abnormal; Notable for the following components:   Glucose, Bld 161 (*)    All other components within normal limits  SARS CORONAVIRUS 2 (TAT 6-24 HRS)  HIV ANTIBODY (ROUTINE TESTING W REFLEX)  PROCALCITONIN  TROPONIN I (HIGH SENSITIVITY)  TROPONIN I (HIGH SENSITIVITY)   EKG Interpretation  Date/Time:  Friday July 31 2019 10:54:09 EDT Ventricular Rate:  97 PR Interval:  126 QRS Duration: 82 QT Interval:  388 QTC Calculation: 492 R Axis:   98 Text Interpretation:  Normal sinus rhythm Rightward axis Borderline ECG Confirmed by Davonna Belling [phone]) on 07/31/2019 2:59:09 PM  Ct Chest W Contrast  Result Date: 07/31/2019 CLINICAL DATA:  Cough shortness of breath generalized weakness EXAM: CT CHEST WITH CONTRAST TECHNIQUE: Multidetector CT imaging of the chest was performed during intravenous contrast administration. CONTRAST:  74mL OMNIPAQUE IOHEXOL 300 MG/ML  SOLN COMPARISON:  July 08, 2017 FINDINGS: Cardiovascular: No filling defects are seen within the central or segmental pulmonary arteries. Due to a large mass like area of consolidation in the left lower lobe and lingula there does however appear to be narrowing of the subsegmental lower lobe pulmonary branches. There is mild cardiomegaly. No definite evidence of right ventricular heart strain. Aortic atherosclerosis is noted. There is a patent 3 vessel arch. There is mild prominence to the main pulmonary arteries which could be due to pulmonary artery hypertension. Mediastinum/Nodes: There is large subcarinal adenopathy  measuring 1.7 cm in transverse dimension. The large soft tissue mass within the left lower lobe extends into the left hilum. There is also pre-vascular adenopathy measuring 1.5 cm in transverse dimension. The esophagus is unremarkable. Lungs/Pleura: Again noted is extensive centrilobular emphysematous changes, right worse than left. Along the major fissure in the right lung there is a spiculated nodular opacity which appears enlarged from the prior exam measuring 3.5 x 1.9 cm. Within the right lower lobe there is ground-glass opacities and interstitial thickening, which also extends into the right middle lobe. There is a large heterogeneous ill-defined soft tissue mass seen within the lingula and left lower lobe which extends into the left hilum. There is adjacent interstitial thickening and small nodular ill-defined opacity. There is also adjacent honeycombing and reticulonodular opacities in the left lower lung. The mass appears to invade the left hilum and mediastinum and compress the left lower lobe mainstem bronchus. There is a narrowing of subsegmental branches of the pulmonary arteries within the left lower lobe and lingula. Upper Abdomen: No acute abnormalities present in the visualized portions of the upper abdomen. Musculoskeletal: No chest wall abnormality. No acute or significant osseous findings. IMPRESSION: 1. New large soft tissue masslike consolidation within the left lower lobe and lingula that likely represents pulmonary neoplasm, with possible superimposed infectious etiology. 2. 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 3. The masslike consolidation extends into the left hilum and mediastinum and causes compression of the left mainstem bronchus as well as left subsegmental pulmonary arterial branches. 4. Subcarinal and prevascular adenopathy. 5. Ill-defined  ground-glass opacities with adjacent reticulonodular opacities in the right lower lung and right middle lobe which could represent lymphangitic carcinomatosis/inflammatory changes. 6. Ill-defined spiculated nodule measuring 3.5 x 1.9 cm along the right major fissure which has slightly increased in size from the prior exam, and could also represent a pulmonary neoplasm versus worsening scarring. 7. Extensive centrilobular emphysematous changes. 8.  Aortic Atherosclerosis (ICD10-I70.0). These results were called by telephone at the time of interpretation on 07/31/2019 at 7:18 pm to provider [facility] , who verbally acknowledged these results. Electronically Signed   By: Prudencio Pair M.D.   On: 07/31/2019 19:23   Dg Chest Port 1 View  Result Date: 07/31/2019 CLINICAL DATA:  Shortness of breath. EXAM: PORTABLE CHEST 1 VIEW COMPARISON:  Chest x-ray dated 09/26/2019 FINDINGS: There has been significant progression of the infiltrates in the left mid and lower lung zone with new infiltrate at the right lung base. There is increased fullness at the inferior aspect of the left hilum as well as at the left base medially. The patient has severe emphysematous changes in both upper lobes, right greater than left. No effusions. Aortic atherosclerosis. No acute bone abnormality. IMPRESSION: 1. Progressive infiltrates in the left mid and lower lung zone and new infiltrate  at the right lung base. This could represent progressive pneumonia but the possibility of lymphangitic tumor should also be considered. 2. New fullness at the inferior aspect of the left hilum as well as the left base medially. I am concerned that there may be an underlying mass in the left lung. CT scan of the chest with contrast recommended for further evaluation. 3. Emphysema. Electronically Signed   By: Lorriane Shire M.D.   On: 07/31/2019 15:02     Procedures  MDM   63 year old female with severe COPD with chronic O2 requirement, recent  admission for COPD exacerbation and pneumonia, returning 1 day after discharge for worsening shortness of breath.  Increasing oxygen requirement today.  Chest x-ray shows possible worsening pneumonia, CT ordered to better characterize.  Patient continues to complain of pain, did not take her home methadone today, this was ordered for the patient.  Called by radiology regarding CT scan results, patient with a new large soft tissue masslike consolidation in the left lower lobe and lingula that they think is likely a pulmonary neoplasm with possible superimposed infectious etiology.  There is adjacent ill-defined nodular opacities that likely represent lymphangitic carcinomatosis or postobstructive inflammatory changes.  This extends into the left hilum and mediastinum and causes some compression of the left mainstem bronchus.  Subcarinal and perivascular adenopathy noted.  There is an ill-defined spiculated nodule in the right major fissure that is likely pulmonary neoplasm versus worsening scarring.  Patient has not had CT imaging of her chest since 2018 and this is a new finding.  Given patient's increasing oxygen requirement and difficulty caring for herself at home do not feel that she would be able to manage outpatient follow-up and evaluation of this new large lung mass, will consult medicine for admission.  Case discussed with Dr. Marlowe Sax with [facility] hospitalist who will see and admit the patient.       Jacqlyn Larsen, PA-C 07/31/19 2212    Blanchie Dessert, MD 08/01/19 1525

## 2019-07-31 NOTE — ED Notes (Signed)
CONTACTED MD KAKRAKANDY IN REGARDS TO PATIENT'S SATS IN THE 70S CONSISTENTLY ON 8LPM. PT REMAINS AGITATED AND NON COMPLIANT

## 2019-07-31 NOTE — ED Notes (Signed)
Pt was placed on a venti mask by RT. Pt ripped the mask off and began to de-sat to the 50's. [place] was reapplied and this nurse explained to the pt the need to keep the mask or the [place] in her nose. Pt became agitated and combative and told us to get out. Pt stated she was not taking anything off. Pt encouraged again to keep [place] in nose. Pt continued to try to pull mask and [place] out of nose.

## 2019-07-31 NOTE — ED Notes (Signed)
[place] reapplied, pt still trying to pull [place] out of the nose and is still combative and agitated asking nursing staff to leave the room and let her be. Pt was re-educated on the need for the O2 and encouraged to lay back and rest and focus on her breathing.

## 2019-07-31 NOTE — ED Notes (Signed)
PAGE MD RATHORE ABOUT PT CHANGE IN STATUS, GROWING INCREASINGLY COMBATIVE AND AGITATED, RIPPING OFF O2 AND DESATTING TO THE 50S. AWAITING NEW ORDERS

## 2019-07-31 NOTE — ED Notes (Signed)
Patient at 88% O2 on 4 L

## 2019-07-31 NOTE — ED Notes (Signed)
Patient daughter updated by this RN.

## 2019-07-31 NOTE — ED Provider Notes (Signed)
[facility] EMERGENCY DEPARTMENT Provider Note   CSN: [id number] Arrival date & time: 07/31/19  1031     History   Chief Complaint Chief Complaint  Patient presents with   Shortness of Breath   Generalized Body Aches    HPI LEN KLUVER is a 63 y.o. female who presents with SOB and chest pain. PMH significant for severe COPD, chronic pain syndrome on Methadone, anxiety. She states that she was discharged from the hospital yesterday but thinks she was discharged too early. She was admitted for COPD vs CAP. She states that she went home and has been unable to care for herself. She has ongoing chest pain and worsening SOB. She denies fever. Cough is dry and unchanged. She hasn't been able to get her meds and hasn't been able to get her Methadone because she is so weak and can't drive. Her daughter was helping her but had to go take care of her daughter who is sick right now. She didn't know what to do so she came back to the ED.      HPI  Past Medical History:  Diagnosis Date   Anemia    "as a child"   Anxiety    Arthritis    "shoulders; back" (03/16/2015)   Chronic back pain    Chronic bronchitis ([place])    "I basically keep it" (03/16/2015)   Chronic pain    COPD (chronic obstructive pulmonary disease) (HCC)    DDD (degenerative disc disease), lumbar    Degenerative disc disease    Depression    Ectatic aorta ([place]) 07/08/2017   Family history of adverse reaction to anesthesia    "my mother had an allergic reaction when she had kidney removed back in the '60's or '70's""   Headache    "monthly; need glasses; comes on when I'm stressed or get too tired" (03/16/2015)   Hepatitis C    History of stomach ulcers    Hypertension    Kidney stone    MRSA carrier 07/09/2017   On home O2    "~ 3L once/wk and prn" (03/16/2015)   Pain management    Pneumonia    "@ least once/yr for the past 8 yrs" (03/16/2015)   Spinal stenosis    Tobacco use       Patient Active Problem List   Diagnosis Date Noted   Multifocal pneumonia 07/27/2019   Pneumonia 07/27/2019   Grief 04/13/2019   Physical deconditioning 04/13/2019   Protein-calorie malnutrition, severe 07/10/2017   MRSA carrier 07/09/2017   Anemia 07/08/2017   Renal insufficiency 07/08/2017   Ectatic aorta ([place]) 07/08/2017   COPD exacerbation ([place]) 07/19/2016   Lung nodule 07/19/2016   Septic shock ([place]) 04/05/2016   HCAP (healthcare-associated pneumonia) 04/05/2016   CAP (community acquired pneumonia) 03/07/2016   Acute respiratory failure with hypoxia ([place]) 03/16/2015   COPD (chronic obstructive pulmonary disease) ([place]) 03/16/2015   HTN (hypertension) 03/16/2015   Leukocytosis 03/16/2015   Hyperglycemia 03/16/2015   Chronic pain syndrome 03/16/2015   Hepatitis C 12/18/2007   TOBACCO ABUSE 09/11/2007   Substance abuse ([place]) 09/11/2007    Past Surgical History:  Procedure Laterality Date   ANTERIOR CERVICAL DECOMP/DISCECTOMY FUSION  2002   BACK SURGERY     FEMUR IM NAIL Right 11/28/2012   Procedure: INTRAMEDULLARY (IM) RETROGRADE FEMORAL NAILING wants jackson table , c-arm and biomet ;  Surgeon: Mauri Pole, MD;  Location: [facility];  Service: Orthopedics;  Laterality: Right;  FRACTURE SURGERY     INCONTINENCE SURGERY  1998   TOTAL ABDOMINAL HYSTERECTOMY  1998     OB History   No obstetric history on file.      Home Medications    Prior to Admission medications   Medication Sig Start Date End Date Taking? Authorizing Provider  albuterol (PROVENTIL HFA;VENTOLIN HFA) 108 (90 Base) MCG/ACT inhaler Inhale 2 puffs into the lungs every 6 (six) hours as needed for wheezing or shortness of breath. 12/25/18   Icard, Leory Plowman L, DO  albuterol (PROVENTIL) (2.5 MG/3ML) 0.083% nebulizer solution Take 3 mLs (2.5 mg total) by nebulization every 6 (six) hours as needed for wheezing or shortness of breath (buy this only if you cannot afford the  Duoneb). 05/29/18   Jola Schmidt, MD  amLODipine (NORVASC) 5 MG tablet Take 1 tablet (5 mg total) by mouth daily. 07/12/17   Mariel Aloe, MD  budesonide (PULMICORT) 0.5 MG/2ML nebulizer solution Take 2 mLs (0.5 mg total) by nebulization 2 (two) times daily. Please run through [facility] Dx J44.9 04/14/19 07/27/19  Martyn Ehrich, NP  doxycycline (VIBRA-TABS) 100 MG tablet Take 1 tablet (100 mg total) by mouth every 12 (twelve) hours. 07/30/19   Nita Sells, MD  feeding supplement, ENSURE ENLIVE, (ENSURE ENLIVE) LIQD Take 237 mLs by mouth 3 (three) times daily between meals. 07/11/17   Mariel Aloe, MD  formoterol (PERFOROMIST) 20 MCG/2ML nebulizer solution Take 2 mLs (20 mcg total) by nebulization 2 (two) times daily. Please run through [facility] Dx J44.9 04/14/19 07/27/19  Martyn Ehrich, NP  gabapentin (NEURONTIN) 800 MG tablet Take 800 mg by mouth 4 (four) times daily.     [provider]  methadone (DOLOPHINE) 10 MG/5ML solution Take 80 mg by mouth daily.    [provider]  Nebulizers (NEBULIZER COMPRESSOR) MISC Use as directed 12/25/18   Icard, Leory Plowman L, DO  nicotine (NICODERM CQ - DOSED IN MG/24 HOURS) 14 mg/24hr patch Place 1 patch (14 mg total) onto the skin daily. 07/30/19   Nita Sells, MD  predniSONE (DELTASONE) 10 MG tablet Take 10 mg by mouth daily with breakfast.  11/21/18   [provider]  sertraline (ZOLOFT) 100 MG tablet Take 100 mg by mouth daily.  11/21/18   [provider]    Family History Family History  Problem Relation Age of Onset   Cancer Mother    Emphysema Mother    Heart failure Father    Stroke Father     Social History Social History   Tobacco Use   Smoking status: Former Smoker    Packs/day: 1.00    Years: 47.00    Pack years: 47.00    Types: Cigarettes    Quit date: 07/09/2019    Years since quitting: 0.0   Smokeless tobacco: Never Used   Tobacco comment: Reports she is no longer able  to smoke  Substance Use Topics   Alcohol use: No   Drug use: Yes    Types: Marijuana    Comment: History of narcotic abuse, [place] on Methadone; uses marijuana      Allergies   Aspirin   Review of Systems Review of Systems  Constitutional: Negative for fever.  Respiratory: Positive for cough and shortness of breath. Negative for wheezing.   Cardiovascular: Positive for chest pain. Negative for palpitations and leg swelling.  Musculoskeletal: Positive for arthralgias and back pain.  Neurological: Positive for weakness.  All other systems reviewed and are negative.  Physical Exam Updated Vital Signs BP (!) 119/94    Pulse 83    Temp (!) 97.3 F (36.3 C) (Oral)    Resp 20    SpO2 90%   Physical Exam Vitals signs and nursing note reviewed.  Constitutional:      General: She is not in acute distress.    Appearance: She is well-developed. She is not ill-appearing.     Comments: Chronically ill appearing. On 4L via [place] satting at 87%  HENT:     Head: Normocephalic and atraumatic.  Eyes:     General: No scleral icterus.       Right eye: No discharge.        Left eye: No discharge.     Conjunctiva/sclera: Conjunctivae normal.     Pupils: Pupils are equal, round, and reactive to light.  Neck:     Musculoskeletal: Normal range of motion.  Cardiovascular:     Rate and Rhythm: Normal rate and regular rhythm.  Pulmonary:     Effort: Pulmonary effort is normal. No respiratory distress.     Breath sounds: Normal breath sounds.  Abdominal:     General: There is no distension.     Palpations: Abdomen is soft.     Tenderness: There is no abdominal tenderness.  Skin:    General: Skin is warm and dry.  Neurological:     Mental Status: She is alert and oriented to person, place, and time.  Psychiatric:        Behavior: Behavior normal.      ED Treatments / Results  Labs (all labs ordered are listed, but only abnormal results are displayed) Labs  Reviewed  CBC - Abnormal; Notable for the following components:      Result Value   WBC 16.4 (*)    Hemoglobin 11.4 (*)    MCHC 29.9 (*)    All other components within normal limits  BASIC METABOLIC PANEL - Abnormal; Notable for the following components:   Glucose, Bld 161 (*)    All other components within normal limits  TROPONIN I (HIGH SENSITIVITY)  TROPONIN I (HIGH SENSITIVITY)    EKG EKG Interpretation  Date/Time:  Friday July 31 2019 10:54:09 EDT Ventricular Rate:  97 PR Interval:  126 QRS Duration: 82 QT Interval:  388 QTC Calculation: 492 R Axis:   98 Text Interpretation:  Normal sinus rhythm Rightward axis Borderline ECG Confirmed by Davonna Belling [phone]) on 07/31/2019 2:59:09 PM   Radiology Dg Chest Port 1 View  Result Date: 07/31/2019 CLINICAL DATA:  Shortness of breath. EXAM: PORTABLE CHEST 1 VIEW COMPARISON:  Chest x-ray dated 09/26/2019 FINDINGS: There has been significant progression of the infiltrates in the left mid and lower lung zone with new infiltrate at the right lung base. There is increased fullness at the inferior aspect of the left hilum as well as at the left base medially. The patient has severe emphysematous changes in both upper lobes, right greater than left. No effusions. Aortic atherosclerosis. No acute bone abnormality. IMPRESSION: 1. Progressive infiltrates in the left mid and lower lung zone and new infiltrate at the right lung base. This could represent progressive pneumonia but the possibility of lymphangitic tumor should also be considered. 2. New fullness at the inferior aspect of the left hilum as well as the left base medially. I am concerned that there may be an underlying mass in the left lung. CT scan of the chest with contrast recommended for further evaluation. 3. Emphysema. Electronically Signed  By: Lorriane Shire M.D.   On: 07/31/2019 15:02    Procedures Procedures (including critical care time)  Medications Ordered in  ED Medications  predniSONE (DELTASONE) tablet 10 mg (has no administration in time range)  doxycycline (VIBRA-TABS) tablet 100 mg (100 mg Oral Given 07/31/19 1459)  albuterol (VENTOLIN HFA) 108 (90 Base) MCG/ACT inhaler 8 puff (8 puffs Inhalation Given 07/31/19 1457)     Initial Impression / Assessment and Plan / ED Course  I have reviewed the triage vital signs and the nursing notes.  Pertinent labs & imaging results that were available during my care of the patient were reviewed by me and considered in my medical decision making (see chart for details).  63 year old female presents with worsening pain and SOB after being discharged from the hospital yesterday. She feels she was discharged too early and has been unable to care for herself at home. Sats are high 80s on 4L via [place]. Unclear if this is progression of disease vs worsening pneumonia. Will repeat CXR. CBC shows leukocytosis (16.4) which could be from steroid use. BMP is remarkable for mild hyperglycemia. 1st and 2nd trop are 37. EKG is NSR. CXR shows possible progressive pneumonia vs mass. Will further characterize with CT chest. Dr. Alvino Chapel discussed with Dr. Verlon Au who discharged pt yesterday - he feels patient's complaints were primarily related to her chronic pain. Pt is DNR but is not comfort care. Discussed with Palliative medicine who will follow pt. Care signed out to [facility] who will dispo. Likely admit for ongoing management vs pt will need placement.  Final Clinical Impressions(s) / ED Diagnoses   Final diagnoses:  Acute on chronic respiratory failure with hypoxia [facility])    ED Discharge Orders    None       Recardo Evangelist, PA-C 07/31/19 1606    Davonna Belling, MD 07/31/19 1610

## 2019-07-31 NOTE — Progress Notes (Signed)
VAST consulted to place IV. Upon arrival at bedside, pt stated she did not want CT scan done tonight and did not want to be stuck. Notified ER RN, Zelphia Cairo. If pt changes her mind, a new IVT consult will be placed.

## 2019-07-31 NOTE — ED Notes (Signed)
Pt refused IV at this time by IV team. Reports that she doesn't want the scan until the morning. PA made aware by this RN.

## 2019-07-31 NOTE — H&P (Signed)
History and Physical    Emily Moran PNT:[id number] DOB: 08-21-1956 DOA: 07/31/2019  PCP: Nolene Ebbs, MD Patient coming from: Home  Chief Complaint: Shortness of breath  HPI: Emily Moran is a 63 y.o. female with medical history significant of end-stage COPD on 3 L oxygen at baseline and palliative care, HCV status post treatment, degenerative joint disease, bipolar disorder, anxiety, depression, chronic pain on methadone, hypertension, tobacco use admitted to the hospital on 10/19 for end-stage COPD and multifocal pneumonia.  It was later thought that her presentation was more related to end-stage COPD than pneumonia.  She was discharged yesterday 10/22.  Patient states she has continued to feel very tired and short of breath since she left the hospital.  She could barely walk to her car.  She also experiences pain on both sides of her chest when she feels short of breath.  States she was previously using 3 L home oxygen but since leaving the hospital has increased it to 4 L.  ED Course: Afebrile.  Not tachycardic or significantly tachypneic.  Oxygen saturation 88% on 3 L home oxygen, improved with 4 L supplemental oxygen.  White blood cell count 16.4 (on prednisone).  SARS-CoV-2 test pending.  High-sensitivity troponin x2 negative.  EKG not suggestive of ACS.  CT chest done (findings mentioned below). Patient received albuterol, doxycycline, methadone 80 mg, Zofran, Percocet 5-325 mg, and prednisone 10 mg in the ED.  Review of Systems:  All systems reviewed and apart from history of presenting illness, are negative.  Past Medical History:  Diagnosis Date  . Anemia    "as a child"  . Anxiety   . Arthritis    "shoulders; back" (03/16/2015)  . Chronic back pain   . Chronic bronchitis ([place])    "I basically keep it" (03/16/2015)  . Chronic pain   . COPD (chronic obstructive pulmonary disease) ([place])   . DDD (degenerative disc disease), lumbar   . Degenerative disc disease   . Depression    . Ectatic aorta ([place]) 07/08/2017  . Family history of adverse reaction to anesthesia    "my mother had an allergic reaction when she had kidney removed back in the '60's or '70's""  . Headache    "monthly; need glasses; comes on when I'm stressed or get too tired" (03/16/2015)  . Hepatitis C   . History of stomach ulcers   . Hypertension   . Kidney stone   . MRSA carrier 07/09/2017  . On home O2    "~ 3L once/wk and prn" (03/16/2015)  . Pain management   . Pneumonia    "@ least once/yr for the past 8 yrs" (03/16/2015)  . Spinal stenosis   . Tobacco use     Past Surgical History:  Procedure Laterality Date  . ANTERIOR CERVICAL DECOMP/DISCECTOMY FUSION  2002  . BACK SURGERY    . FEMUR IM NAIL Right 11/28/2012   Procedure: INTRAMEDULLARY (IM) RETROGRADE FEMORAL NAILING wants jackson table , c-arm and biomet ;  Surgeon: Mauri Pole, MD;  Location: [facility];  Service: Orthopedics;  Laterality: Right;  . FRACTURE SURGERY    . INCONTINENCE SURGERY  1998  . TOTAL ABDOMINAL HYSTERECTOMY  1998     reports that she quit smoking about 3 weeks ago. Her smoking use included cigarettes. She has a 47.00 pack-year smoking history. She has never used smokeless tobacco. She reports current drug use. Drug: Marijuana. She reports that she does not drink alcohol.  Allergies  Allergen Reactions  .  Aspirin     Upset stomach    Family History  Problem Relation Age of Onset  . Cancer Mother   . Emphysema Mother   . Heart failure Father   . Stroke Father     Prior to Admission medications   Medication Sig Start Date End Date Taking? Authorizing Provider  albuterol (PROVENTIL HFA;VENTOLIN HFA) 108 (90 Base) MCG/ACT inhaler Inhale 2 puffs into the lungs every 6 (six) hours as needed for wheezing or shortness of breath. 12/25/18   Icard, Leory Plowman L, DO  albuterol (PROVENTIL) (2.5 MG/3ML) 0.083% nebulizer solution Take 3 mLs (2.5 mg total) by nebulization every 6 (six) hours as needed for wheezing or  shortness of breath (buy this only if you cannot afford the Duoneb). 05/29/18   Jola Schmidt, MD  amLODipine (NORVASC) 5 MG tablet Take 1 tablet (5 mg total) by mouth daily. 07/12/17   Mariel Aloe, MD  budesonide (PULMICORT) 0.5 MG/2ML nebulizer solution Take 2 mLs (0.5 mg total) by nebulization 2 (two) times daily. Please run through [facility] Dx J44.9 04/14/19 07/27/19  Martyn Ehrich, NP  doxycycline (VIBRA-TABS) 100 MG tablet Take 1 tablet (100 mg total) by mouth every 12 (twelve) hours. 07/30/19   Nita Sells, MD  feeding supplement, ENSURE ENLIVE, (ENSURE ENLIVE) LIQD Take 237 mLs by mouth 3 (three) times daily between meals. 07/11/17   Mariel Aloe, MD  formoterol (PERFOROMIST) 20 MCG/2ML nebulizer solution Take 2 mLs (20 mcg total) by nebulization 2 (two) times daily. Please run through [facility] Dx J44.9 04/14/19 07/27/19  Martyn Ehrich, NP  gabapentin (NEURONTIN) 800 MG tablet Take 800 mg by mouth 4 (four) times daily.     [provider]  methadone (DOLOPHINE) 10 MG/5ML solution Take 80 mg by mouth daily.    [provider]  Nebulizers (NEBULIZER COMPRESSOR) MISC Use as directed 12/25/18   Icard, Leory Plowman L, DO  nicotine (NICODERM CQ - DOSED IN MG/24 HOURS) 14 mg/24hr patch Place 1 patch (14 mg total) onto the skin daily. 07/30/19   Nita Sells, MD  predniSONE (DELTASONE) 10 MG tablet Take 10 mg by mouth daily with breakfast.  11/21/18   [provider]  sertraline (ZOLOFT) 100 MG tablet Take 100 mg by mouth daily.  11/21/18   [provider]    Physical Exam: Vitals:   07/31/19 1730 07/31/19 1931 07/31/19 2000 07/31/19 2026  BP: (!) 155/91 (!) 166/98 (!) 160/91   Pulse: 91 93 95 (!) 53  Resp: 11 14 16    Temp:      TempSrc:      SpO2: 94% 92%  91%    Physical Exam  Constitutional: She is oriented to person, place, and time.  Cachectic, very lethargic  HENT:  Head: Normocephalic.  Eyes: Right eye exhibits no discharge.  Left eye exhibits no discharge.  Neck: Neck supple.  Cardiovascular: Normal rate, regular rhythm and intact distal pulses.  Pulmonary/Chest: Effort normal. She has no wheezes. She has no rales.  On 4 L supplemental oxygen via nasal cannula No increased work of breathing  Abdominal: Soft. Bowel sounds are normal. She exhibits no distension. There is no abdominal tenderness. There is no guarding.  Musculoskeletal:        General: No edema.  Neurological: She is alert and oriented to person, place, and time.  Skin: Skin is warm and dry.     Labs on Admission: I have personally reviewed following labs and imaging studies  CBC: Recent Labs  Lab 07/27/19 1230 07/28/19 0239 07/31/19 1058  WBC 14.3* 13.9* 16.4*  NEUTROABS  --  10.8*  --   HGB 11.5* 10.7* 11.4*  HCT 39.3 36.1 38.1  MCV 96.6 95.0 95.5  PLT 411* 325 425   Basic Metabolic Panel: Recent Labs  Lab 07/27/19 1230 07/27/19 1922 07/28/19 0239 07/29/19 0808 07/30/19 0135 07/31/19 1058  NA 136  --  140 141 140 140  K 3.0* 2.8* 2.6* 3.3* 3.4* 4.1  CL 94*  --  101 103 103 102  CO2 29  --  29 26 27 26   GLUCOSE 104*  --  75 111* 82 161*  BUN 8  --  9 8 9 20   CREATININE 0.87  --  0.94 0.84 0.77 0.93  CALCIUM 8.8*  --  8.3* 8.6* 8.7* 9.3  MG  --  2.2  --  1.7 1.9  --   PHOS  --   --   --   --  3.5  --    GFR: Estimated Creatinine Clearance: 55.2 mL/min (by C-G formula based on SCr of 0.93 mg/dL). Liver Function Tests: Recent Labs  Lab 07/29/19 0808 07/30/19 0135  AST 14*  --   ALT 9  --   ALKPHOS 88  --   BILITOT 0.3  --   PROT 6.5  --   ALBUMIN 2.4* 2.3*   No results for input(s): LIPASE, AMYLASE in the last 168 hours. No results for input(s): AMMONIA in the last 168 hours. Coagulation Profile: No results for input(s): INR, PROTIME in the last 168 hours. Cardiac Enzymes: No results for input(s): CKTOTAL, CKMB, CKMBINDEX, TROPONINI in the last 168 hours. BNP (last 3 results) No results for input(s): PROBNP  in the last 8760 hours. HbA1C: No results for input(s): HGBA1C in the last 72 hours. CBG: No results for input(s): GLUCAP in the last 168 hours. Lipid Profile: No results for input(s): CHOL, HDL, LDLCALC, TRIG, CHOLHDL, LDLDIRECT in the last 72 hours. Thyroid Function Tests: No results for input(s): TSH, T4TOTAL, FREET4, T3FREE, THYROIDAB in the last 72 hours. Anemia Panel: No results for input(s): VITAMINB12, FOLATE, FERRITIN, TIBC, IRON, RETICCTPCT in the last 72 hours. Urine analysis:    Component Value Date/Time   COLORURINE AMBER (A) 04/05/2016 2127   APPEARANCEUR CLOUDY (A) 04/05/2016 2127   LABSPEC 1.021 04/05/2016 2127   PHURINE 5.5 04/05/2016 2127   GLUCOSEU NEGATIVE 04/05/2016 2127   HGBUR TRACE (A) 04/05/2016 2127   BILIRUBINUR SMALL (A) 04/05/2016 2127   KETONESUR NEGATIVE 04/05/2016 2127   PROTEINUR 100 (A) 04/05/2016 2127   UROBILINOGEN 4.0 (H) 06/03/2012 1648   NITRITE NEGATIVE 04/05/2016 2127   LEUKOCYTESUR TRACE (A) 04/05/2016 2127    Radiological Exams on Admission: Ct Chest W Contrast  Result Date: 07/31/2019 CLINICAL DATA:  Cough shortness of breath generalized weakness EXAM: CT CHEST WITH CONTRAST TECHNIQUE: Multidetector CT imaging of the chest was performed during intravenous contrast administration. CONTRAST:  81mL OMNIPAQUE IOHEXOL 300 MG/ML  SOLN COMPARISON:  July 08, 2017 FINDINGS: Cardiovascular: No filling defects are seen within the central or segmental pulmonary arteries. Due to a large mass like area of consolidation in the left lower lobe and lingula there does however appear to be narrowing of the subsegmental lower lobe pulmonary branches. There is mild cardiomegaly. No definite evidence of right ventricular heart strain. Aortic atherosclerosis is noted. There is a patent 3 vessel arch. There is mild prominence to the main pulmonary arteries which could be due to pulmonary artery hypertension.  Mediastinum/Nodes: There is large subcarinal adenopathy  measuring 1.7 cm in transverse dimension. The large soft tissue mass within the left lower lobe extends into the left hilum. There is also pre-vascular adenopathy measuring 1.5 cm in transverse dimension. The esophagus is unremarkable. Lungs/Pleura: Again noted is extensive centrilobular emphysematous changes, right worse than left. Along the major fissure in the right lung there is a spiculated nodular opacity which appears enlarged from the prior exam measuring 3.5 x 1.9 cm. Within the right lower lobe there is ground-glass opacities and interstitial thickening, which also extends into the right middle lobe. There is a large heterogeneous ill-defined soft tissue mass seen within the lingula and left lower lobe which extends into the left hilum. There is adjacent interstitial thickening and small nodular ill-defined opacity. There is also adjacent honeycombing and reticulonodular opacities in the left lower lung. The mass appears to invade the left hilum and mediastinum and compress the left lower lobe mainstem bronchus. There is a narrowing of subsegmental branches of the pulmonary arteries within the left lower lobe and lingula. Upper Abdomen: No acute abnormalities present in the visualized portions of the upper abdomen. Musculoskeletal: No chest wall abnormality. No acute or significant osseous findings. IMPRESSION: 1. New large soft tissue masslike consolidation within the left lower lobe and lingula that likely represents pulmonary neoplasm, with possible superimposed infectious etiology. 2. 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 3. The masslike consolidation extends into the left hilum and mediastinum and causes compression of the left mainstem bronchus as well as left subsegmental pulmonary arterial branches. 4. Subcarinal and prevascular adenopathy. 5. Ill-defined  ground-glass opacities with adjacent reticulonodular opacities in the right lower lung and right middle lobe which could represent lymphangitic carcinomatosis/inflammatory changes. 6. Ill-defined spiculated nodule measuring 3.5 x 1.9 cm along the right major fissure which has slightly increased in size from the prior exam, and could also represent a pulmonary neoplasm versus worsening scarring. 7. Extensive centrilobular emphysematous changes. 8.  Aortic Atherosclerosis (ICD10-I70.0). These results were called by telephone at the time of interpretation on 07/31/2019 at 7:18 pm to provider [facility] , who verbally acknowledged these results. Electronically Signed   By: Prudencio Pair M.D.   On: 07/31/2019 19:23   Dg Chest Port 1 View  Result Date: 07/31/2019 CLINICAL DATA:  Shortness of breath. EXAM: PORTABLE CHEST 1 VIEW COMPARISON:  Chest x-ray dated 09/26/2019 FINDINGS: There has been significant progression of the infiltrates in the left mid and lower lung zone with new infiltrate at the right lung base. There is increased fullness at the inferior aspect of the left hilum as well as at the left base medially. The patient has severe emphysematous changes in both upper lobes, right greater than left. No effusions. Aortic atherosclerosis. No acute bone abnormality. IMPRESSION: 1. Progressive infiltrates in the left mid and lower lung zone and new infiltrate at the right lung base. This could represent progressive pneumonia but the possibility of lymphangitic tumor should also be considered. 2. New fullness at the inferior aspect of the left hilum as well as the left base medially. I am concerned that there may be an underlying mass in the left lung. CT scan of the chest with contrast recommended for further evaluation. 3. Emphysema. Electronically Signed   By: Lorriane Shire M.D.   On: 07/31/2019 15:02    EKG: Independently reviewed.  Sinus rhythm, artifact.  Assessment/Plan Principal Problem:   SOB  (  shortness of breath) Active Problems:   TOBACCO ABUSE   HTN (hypertension)   Chronic pain syndrome   Advanced COPD (HCC)   Shortness of breath- multifactorial She has baseline advanced COPD and is on palliative care.  CT chest with extensive findings concerning for malignancy and possible infection.  There is also evidence of masslike consolidation causing compression of the left mainstem bronchus as well as left subsegmental pulmonary arterial branches.  Please read full CT chest report above.  Her supplemental oxygen requirement has increased from baseline.  Normally uses 3 L, now requiring 4 L.  No increased work of breathing at present and no wheezing appreciated on exam. -CT abdomen pelvis with contrast scheduled for tomorrow for further staging -Consult oncology in a.m. -For advanced COPD-continue supplemental oxygen.  Continue prednisone 10 mg daily.  Combivent scheduled every 6 hours.  Continue home Pulmicort inhaler.  Albuterol inhaler as needed. -Afebrile.  White blood cell count 16.4 in the setting of steroid use.  No signs of sepsis.  CT with findings concerning for possible infection.  She was treated with doxycycline during her recent hospitalization and discharged on this medication.  Continue doxycycline at this time.  Check procalcitonin level. -SARS-CoV-2 test pending -Continuous pulse ox -Supplemental oxygen -Patient wishes to be DNR and DNI.  Given these CT findings and her advanced COPD, she understands that her prognosis is likely very poor. -Palliative care consult  Chronic pain syndrome -Continue home dose of methadone  Hypertension -Continue home amlodipine -Hydralazine PRN SBP >160  Tobacco use -Continue NicoDerm patch which was started during her recent hospitalization.  Physical deconditioning -PT evaluation  DVT prophylaxis: Subcutaneous heparin Code Status: DNR/DNI. Family Communication: No family available. Disposition Plan: Anticipate discharge  after clinical improvement. Consults called: None Admission status: It is my clinical opinion that referral for OBSERVATION is reasonable and necessary in this patient based on the above information provided. The aforementioned taken together are felt to place the patient at high risk for further clinical deterioration. However it is anticipated that the patient may be medically stable for discharge from the hospital within 24 to 48 hours.  The medical decision making on this patient was of high complexity and the patient is at high risk for clinical deterioration, therefore this is a level 3 visit.  Shela Leff MD [facility] Pager [phone]  If 7PM-7AM, please contact night-coverage [email or web address] Password [email or web address]  07/31/2019, 9:06 PM

## 2019-07-31 NOTE — ED Notes (Signed)
Patient returned by CT staff unable to get CT due to IV displacement. Per CT, IV catheter was pulled out.

## 2019-07-31 NOTE — ED Notes (Signed)
CT informed by this RN that patient has patent IV and is ready for CT.

## 2019-07-31 NOTE — ED Triage Notes (Addendum)
Pt reports she was discharged yesterday for chest pain, sob and body aches, had negative covid test while here. States symptoms are still persisting and she does not feel any better than she did.3L O2 [place] at baseline. Respirations labored. O2 sats 88%  on 3L

## 2019-08-01 DIAGNOSIS — C3482 Malignant neoplasm of overlapping sites of left bronchus and lung: Secondary | ICD-10-CM | POA: Diagnosis not present

## 2019-08-01 DIAGNOSIS — C3401 Malignant neoplasm of right main bronchus: Secondary | ICD-10-CM | POA: Diagnosis not present

## 2019-08-01 DIAGNOSIS — Z825 Family history of asthma and other chronic lower respiratory diseases: Secondary | ICD-10-CM | POA: Diagnosis not present

## 2019-08-01 DIAGNOSIS — Z20828 Contact with and (suspected) exposure to other viral communicable diseases: Secondary | ICD-10-CM | POA: Diagnosis present

## 2019-08-01 DIAGNOSIS — Z7189 Other specified counseling: Secondary | ICD-10-CM | POA: Diagnosis not present

## 2019-08-01 DIAGNOSIS — Z87891 Personal history of nicotine dependence: Secondary | ICD-10-CM | POA: Diagnosis not present

## 2019-08-01 DIAGNOSIS — Z79891 Long term (current) use of opiate analgesic: Secondary | ICD-10-CM | POA: Diagnosis not present

## 2019-08-01 DIAGNOSIS — Z66 Do not resuscitate: Secondary | ICD-10-CM | POA: Diagnosis present

## 2019-08-01 DIAGNOSIS — J439 Emphysema, unspecified: Secondary | ICD-10-CM | POA: Diagnosis present

## 2019-08-01 DIAGNOSIS — R0602 Shortness of breath: Secondary | ICD-10-CM | POA: Diagnosis not present

## 2019-08-01 DIAGNOSIS — Z886 Allergy status to analgesic agent status: Secondary | ICD-10-CM | POA: Diagnosis not present

## 2019-08-01 DIAGNOSIS — Z681 Body mass index (BMI) 19 or less, adult: Secondary | ICD-10-CM | POA: Diagnosis not present

## 2019-08-01 DIAGNOSIS — Z809 Family history of malignant neoplasm, unspecified: Secondary | ICD-10-CM | POA: Diagnosis not present

## 2019-08-01 DIAGNOSIS — R64 Cachexia: Secondary | ICD-10-CM | POA: Diagnosis present

## 2019-08-01 DIAGNOSIS — Z823 Family history of stroke: Secondary | ICD-10-CM | POA: Diagnosis not present

## 2019-08-01 DIAGNOSIS — J9621 Acute and chronic respiratory failure with hypoxia: Secondary | ICD-10-CM | POA: Diagnosis present

## 2019-08-01 DIAGNOSIS — C3432 Malignant neoplasm of lower lobe, left bronchus or lung: Secondary | ICD-10-CM | POA: Diagnosis present

## 2019-08-01 DIAGNOSIS — J449 Chronic obstructive pulmonary disease, unspecified: Secondary | ICD-10-CM | POA: Diagnosis not present

## 2019-08-01 DIAGNOSIS — I10 Essential (primary) hypertension: Secondary | ICD-10-CM | POA: Diagnosis present

## 2019-08-01 DIAGNOSIS — F17200 Nicotine dependence, unspecified, uncomplicated: Secondary | ICD-10-CM | POA: Diagnosis not present

## 2019-08-01 DIAGNOSIS — Z515 Encounter for palliative care: Secondary | ICD-10-CM | POA: Diagnosis not present

## 2019-08-01 DIAGNOSIS — G894 Chronic pain syndrome: Secondary | ICD-10-CM | POA: Diagnosis present

## 2019-08-01 DIAGNOSIS — Z7952 Long term (current) use of systemic steroids: Secondary | ICD-10-CM | POA: Diagnosis not present

## 2019-08-01 DIAGNOSIS — R918 Other nonspecific abnormal finding of lung field: Secondary | ICD-10-CM | POA: Diagnosis present

## 2019-08-01 DIAGNOSIS — R52 Pain, unspecified: Secondary | ICD-10-CM | POA: Diagnosis not present

## 2019-08-01 DIAGNOSIS — Z8249 Family history of ischemic heart disease and other diseases of the circulatory system: Secondary | ICD-10-CM | POA: Diagnosis not present

## 2019-08-01 DIAGNOSIS — M5136 Other intervertebral disc degeneration, lumbar region: Secondary | ICD-10-CM | POA: Diagnosis present

## 2019-08-01 DIAGNOSIS — Z9981 Dependence on supplemental oxygen: Secondary | ICD-10-CM | POA: Diagnosis not present

## 2019-08-01 DIAGNOSIS — Z981 Arthrodesis status: Secondary | ICD-10-CM | POA: Diagnosis not present

## 2019-08-01 DIAGNOSIS — F319 Bipolar disorder, unspecified: Secondary | ICD-10-CM | POA: Diagnosis present

## 2019-08-01 DIAGNOSIS — C3490 Malignant neoplasm of unspecified part of unspecified bronchus or lung: Secondary | ICD-10-CM | POA: Diagnosis present

## 2019-08-01 DIAGNOSIS — E43 Unspecified severe protein-calorie malnutrition: Secondary | ICD-10-CM | POA: Diagnosis present

## 2019-08-01 DIAGNOSIS — Z7951 Long term (current) use of inhaled steroids: Secondary | ICD-10-CM | POA: Diagnosis not present

## 2019-08-01 LAB — HIV ANTIBODY (ROUTINE TESTING W REFLEX): HIV Screen 4th Generation wRfx: NONREACTIVE

## 2019-08-01 LAB — CULTURE, BLOOD (ROUTINE X 2)
Culture: NO GROWTH
Culture: NO GROWTH
Special Requests: ADEQUATE

## 2019-08-01 LAB — SARS CORONAVIRUS 2 (TAT 6-24 HRS): SARS Coronavirus 2: NEGATIVE

## 2019-08-01 LAB — PROCALCITONIN: Procalcitonin: 0.36 ng/mL

## 2019-08-01 MED ORDER — TRAZODONE HCL 50 MG PO TABS
50.0000 mg | ORAL_TABLET | Freq: Every evening | ORAL | Status: DC | PRN
  Administered 2019-08-01: 22:00:00 50 mg via ORAL
  Filled 2019-08-01: qty 1

## 2019-08-01 NOTE — ED Notes (Signed)
Tele ordered bfast 

## 2019-08-01 NOTE — Evaluation (Signed)
Physical Therapy Evaluation Patient Details Name: Emily Moran MRN: [id number] DOB: [birth year] Today's Date: 08/01/2019   History of Present Illness  62 y.o. female admitted on 07/31/19 for increased SOB and progressive weakness after bing d/c from [facility] on 07/30/19.  CT scan this admission revealed new lung mass.  COVID (-) and pt active hospice patient.  Pt with other significant PMH of tobacco use, spinal stenosis, home O2, HTN, hepatitis C, DDD, COPD, anemia, R femur IM nail, back surgery, ACDF.    Clinical Impression  Pt was able to ambulate with similar assist to her recent admission.  O2 needs have increased, however.  She was on 8L O2 [place] with me for short distance gait down the hallway and sats decreased to 83% with DOE 3/4.  Pt would benefit from acute PT to build strength and activity tolerance while monitoring pulmonary status closely.   PT to follow acutely for deficits listed below.      Follow Up Recommendations Home health PT;Supervision - Intermittent;Other (comment)(resume hospice services?)    Equipment Recommendations  Other (comment)(shower chair)    Recommendations for Other Services   NA    Precautions / Restrictions Precautions Precautions: Fall;Other (comment) Precaution Comments: monitor O2 sats      Mobility  Bed Mobility Overal bed mobility: Independent                Transfers Overall transfer level: Needs assistance Equipment used: None Transfers: Sit to/from Stand Sit to Stand: Min guard         General transfer comment: min guard assist for safety, a bit wobbly on her feet, uses O2 tank like a cane  Ambulation/Gait Ambulation/Gait assistance: Min guard Gait Distance (Feet): 40 Feet Assistive device: (O2 tank) Gait Pattern/deviations: Step-through pattern;Staggering right;Staggering left Gait velocity: decreased Gait velocity interpretation: 1.31 - 2.62 ft/sec, indicative of limited community ambulator General Gait Details: Pt with  mildly staggering gait pattern, O2 sats on 8L O2 [place] were down to 83% with 3/4 DOE         Balance Overall balance assessment: Needs assistance Sitting-balance support: Feet supported;No upper extremity supported Sitting balance-Leahy Scale: Good     Standing balance support: No upper extremity supported Standing balance-Leahy Scale: Fair Standing balance comment: close supervision, some balance checks while doing self peri care.                              Pertinent Vitals/Pain Pain Assessment: Faces Faces Pain Scale: Hurts even more Pain Location: generalized "all over" on methadone for pain management at baseline.  Pain Descriptors / Indicators: Aching;Grimacing;Guarding;Discomfort;Crying Pain Intervention(s): Limited activity within patient's tolerance;Monitored during session;Repositioned;Patient requesting pain meds-RN notified    Home Living Family/patient expects to be discharged to:: Private residence Living Arrangements: Children;Other relatives(daughter and 51 year old granddaugher) Available Help at Discharge: Family;Available PRN/intermittently(daughter works) Type of Home: House Home Access: Stairs to enter Entrance Stairs-Rails: Right Entrance Stairs-Number of Steps: 5 Home Layout: Two level;Able to live on main level with bedroom/bathroom Home Equipment: Kasandra Knudsen - single point;Bedside commode;Walker - 2 wheels Additional Comments: home O2    Prior Function Level of Independence: Needs assistance   Gait / Transfers Assistance Needed: mod I for mobility "sometimes" uses cane vs walker  ADL's / Homemaking Assistance Needed: pt needing assist for IADL management. Able to bath, dress, groom ind with increased time        Hand Dominance   Dominant Hand: Right  Extremity/Trunk Assessment   Upper Extremity Assessment Upper Extremity Assessment: Generalized weakness(visible muscle atrophy, appears malnourished)    Lower Extremity  Assessment Lower Extremity Assessment: Generalized weakness(visible muscle atrophy, appears malnourished)    Cervical / Trunk Assessment Cervical / Trunk Assessment: Other exceptions Cervical / Trunk Exceptions: pt with h/o back surgery and neck surgery,   Communication   Communication: No difficulties  Cognition Arousal/Alertness: Awake/alert Behavior During Therapy: WFL for tasks assessed/performed Overall Cognitive Status: No family/caregiver present to determine baseline cognitive functioning                                 General Comments: Pt a bit disoriented to place and time, but improved the more she woke up (Pt was sleeping when PT came in), had a bit of issue with combativeness with staff last night.              Assessment/Plan    PT Assessment Patient needs continued PT services  PT Problem List Decreased strength;Decreased activity tolerance;Decreased balance;Decreased mobility;Decreased knowledge of use of DME;Cardiopulmonary status limiting activity;Pain       PT Treatment Interventions DME instruction;Gait training;Stair training;Functional mobility training;Therapeutic exercise;Therapeutic activities;Balance training;Neuromuscular re-education;Patient/family education    PT Goals (Current goals can be found in the Care Plan section)  Acute Rehab PT Goals Patient Stated Goal: pt thinks she may need to go back to ALF PT Goal Formulation: With patient Time For Goal Achievement: 08/15/19 Potential to Achieve Goals: Good    Frequency Min 3X/week           AM-PAC PT "6 Clicks" Mobility  Outcome Measure Help needed turning from your back to your side while in a flat bed without using bedrails?: None Help needed moving from lying on your back to sitting on the side of a flat bed without using bedrails?: None Help needed moving to and from a bed to a chair (including a wheelchair)?: A Little Help needed standing up from a chair using your arms  (e.g., wheelchair or bedside chair)?: A Little Help needed to walk in hospital room?: A Little Help needed climbing 3-5 steps with a railing? : A Little 6 Click Score: 20    End of Session Equipment Utilized During Treatment: Oxygen(8 L O2 [place]) Activity Tolerance: Patient limited by fatigue;Other (comment)(limited by DOE) Patient left: in bed;with call bell/phone within reach;with nursing/sitter in room   PT Visit Diagnosis: Unsteadiness on feet (R26.81);Muscle weakness (generalized) (M62.81)    Time: 7673-4193 PT Time Calculation (min) (ACUTE ONLY): 22 min   Charges:         Wells Guiles B. Dorrene Bently, PT, DPT  Acute Rehabilitation [phone] pager [phone]) [phone] office  @ [place] [place]: [phone]   PT Evaluation $PT Eval Moderate Complexity: 1 Mod          08/01/2019, 12:59 PM

## 2019-08-01 NOTE — ED Notes (Addendum)
ED TO INPATIENT HANDOFF REPORT  ED Nurse Name and Phone #: Thurmond Butts [place] Name/Age/Gender Emily Moran 63 y.o. female Room/Bed: 036C/036C  Code Status   Code Status: DNR  Home/SNF/Other Home Patient oriented to: self, place, time and situation Is this baseline? Yes   Triage Complete: Triage complete  Chief Complaint sob  Triage Note Pt reports she was discharged yesterday for chest pain, sob and body aches, had negative covid test while here. States symptoms are still persisting and she does not feel any better than she did.3L O2 [place] at baseline. Respirations labored. O2 sats 88%  on 3L   Allergies Allergies  Allergen Reactions  . Aspirin     Upset stomach    Level of Care/Admitting Diagnosis ED Disposition    ED Disposition Condition Comment   Admit  Hospital Area: [facility] [[id number]]  Level of Care: Med-Surg [16]  Covid Evaluation: Confirmed COVID Negative  Diagnosis: Lung cancer [facility]) [[id number]]  Admitting Physician: Maida Sale  Attending Physician: Geradine Girt [[id number]]  Estimated length of stay: past midnight tomorrow  Certification:: I certify this patient will need inpatient services for at least 2 midnights  PT Class (Do Not Modify): Inpatient [101]  PT Acc Code (Do Not Modify): Private [1]       B Medical/Surgery History Past Medical History:  Diagnosis Date  . Anemia    "as a child"  . Anxiety   . Arthritis    "shoulders; back" (03/16/2015)  . Chronic back pain   . Chronic bronchitis ([place])    "I basically keep it" (03/16/2015)  . Chronic pain   . COPD (chronic obstructive pulmonary disease) ([place])   . DDD (degenerative disc disease), lumbar   . Degenerative disc disease   . Depression   . Ectatic aorta ([place]) 07/08/2017  . Family history of adverse reaction to anesthesia    "my mother had an allergic reaction when she had kidney removed back in the '60's or '70's""  . Headache    "monthly; need glasses; comes  on when I'm stressed or get too tired" (03/16/2015)  . Hepatitis C   . History of stomach ulcers   . Hypertension   . Kidney stone   . MRSA carrier 07/09/2017  . On home O2    "~ 3L once/wk and prn" (03/16/2015)  . Pain management   . Pneumonia    "@ least once/yr for the past 8 yrs" (03/16/2015)  . Spinal stenosis   . Tobacco use    Past Surgical History:  Procedure Laterality Date  . ANTERIOR CERVICAL DECOMP/DISCECTOMY FUSION  2002  . BACK SURGERY    . FEMUR IM NAIL Right 11/28/2012   Procedure: INTRAMEDULLARY (IM) RETROGRADE FEMORAL NAILING wants jackson table , c-arm and biomet ;  Surgeon: Mauri Pole, MD;  Location: [facility];  Service: Orthopedics;  Laterality: Right;  . FRACTURE SURGERY    . INCONTINENCE SURGERY  1998  . TOTAL ABDOMINAL HYSTERECTOMY  1998     A IV Location/Drains/Wounds Patient Lines/Drains/Airways Status   Active Line/Drains/Airways    Name:   Placement date:   Placement time:   Site:   Days:   Peripheral IV 07/31/19 Right;Lateral;Upper Arm   07/31/19    1829    Arm   1   Incision 11/28/12 Hip Right   11/28/12    1812     2437   Wound Burn Hand Left shallow open blisters on r/hand. skin dry  and peeling. No drainage noted   -    -    Hand             Intake/Output Last 24 hours No intake or output data in the 24 hours ending 08/01/19 1227  Labs/Imaging Results for orders placed or performed during the hospital encounter of 07/31/19 (from the past 48 hour(s))  HIV Antibody (routine testing w rflx)     Status: None   Collection Time: 07/31/19 12:00 AM  Result Value Ref Range   HIV Screen 4th Generation wRfx NON REACTIVE NON REACTIVE    Comment: Performed at [facility], [address] [place], [place] [address]  Procalcitonin - Baseline     Status: None   Collection Time: 07/31/19 12:00 AM  Result Value Ref Range   Procalcitonin 0.36 ng/mL    Comment:        Interpretation: PCT (Procalcitonin) <= 0.5 ng/mL: Systemic infection (sepsis) is  not likely. Local bacterial infection is possible. (NOTE)       Sepsis PCT Algorithm           Lower Respiratory Tract                                      Infection PCT Algorithm    ----------------------------     ----------------------------         PCT < 0.25 ng/mL                PCT < 0.10 ng/mL         Strongly encourage             Strongly discourage   discontinuation of antibiotics    initiation of antibiotics    ----------------------------     -----------------------------       PCT 0.25 - 0.50 ng/mL            PCT 0.10 - 0.25 ng/mL               OR       >80% decrease in PCT            Discourage initiation of                                            antibiotics      Encourage discontinuation           of antibiotics    ----------------------------     -----------------------------         PCT >= 0.50 ng/mL              PCT 0.26 - 0.50 ng/mL               AND        <80% decrease in PCT             Encourage initiation of                                             antibiotics       Encourage continuation           of antibiotics    ----------------------------     -----------------------------  PCT >= 0.50 ng/mL                  PCT > 0.50 ng/mL               AND         increase in PCT                  Strongly encourage                                      initiation of antibiotics    Strongly encourage escalation           of antibiotics                                     -----------------------------                                           PCT <= 0.25 ng/mL                                                 OR                                        > 80% decrease in PCT                                     Discontinue / Do not initiate                                             antibiotics Performed at [facility], [address], [place], [place] [address]   Troponin I (High Sensitivity)     Status: None   Collection Time: 07/31/19 10:58 AM   Result Value Ref Range   Troponin I (High Sensitivity) 13 <18 ng/L    Comment: (NOTE) Elevated high sensitivity troponin I (hsTnI) values and significant  changes across serial measurements may suggest ACS but many other  chronic and acute conditions are known to elevate hsTnI results.  Refer to the Links section for chest pain algorithms and additional  guidance. Performed at [facility], [place] [address], [place], [place] [address]   CBC     Status: Abnormal   Collection Time: 07/31/19 10:58 AM  Result Value Ref Range   WBC 16.4 (H) 4.0 - 10.5 K/uL   RBC 3.99 3.87 - 5.11 MIL/uL   Hemoglobin 11.4 (L) 12.0 - 15.0 g/dL   HCT 38.1 36.0 - 46.0 %   MCV 95.5 80.0 - 100.0 fL   MCH 28.6 26.0 - 34.0 pg   MCHC 29.9 (L) 30.0 - 36.0 g/dL   RDW 14.9 11.5 - 15.5 %   Platelets 392 150 - 400 K/uL   nRBC 0.0  0.0 - 0.2 %    Comment: Performed at [facility], [place] [address], [place], [place] [address]  Basic metabolic panel     Status: Abnormal   Collection Time: 07/31/19 10:58 AM  Result Value Ref Range   Sodium 140 135 - 145 mmol/L   Potassium 4.1 3.5 - 5.1 mmol/L   Chloride 102 98 - 111 mmol/L   CO2 26 22 - 32 mmol/L   Glucose, Bld 161 (H) 70 - 99 mg/dL   BUN 20 8 - 23 mg/dL   Creatinine, Ser 0.93 0.44 - 1.00 mg/dL   Calcium 9.3 8.9 - 10.3 mg/dL   GFR calc non Af Amer >60 >60 mL/min   GFR calc Af Amer >60 >60 mL/min   Anion gap 12 5 - 15    Comment: Performed at [facility], [place] [address], [place], [place] [address]  Troponin I (High Sensitivity)     Status: None   Collection Time: 07/31/19  2:34 PM  Result Value Ref Range   Troponin I (High Sensitivity) 13 <18 ng/L    Comment: (NOTE) Elevated high sensitivity troponin I (hsTnI) values and significant  changes across serial measurements may suggest ACS but many other  chronic and acute conditions are known to elevate hsTnI results.  Refer to the "Links" section for chest pain algorithms and additional   guidance. Performed at [facility], [place] [address] [place], [place] [address]   SARS CORONAVIRUS 2 (TAT 6-24 HRS) Nasopharyngeal Nasopharyngeal Swab     Status: None   Collection Time: 07/31/19  8:53 PM   Specimen: Nasopharyngeal Swab  Result Value Ref Range   SARS Coronavirus 2 NEGATIVE NEGATIVE    Comment: (NOTE) SARS-CoV-2 target nucleic acids are NOT DETECTED. The SARS-CoV-2 RNA is generally detectable in upper and lower respiratory specimens during the acute phase of infection. Negative results do not preclude SARS-CoV-2 infection, do not rule out co-infections with other pathogens, and should not be used as the sole basis for treatment or other patient management decisions. Negative results must be combined with clinical observations, patient history, and epidemiological information. The expected result is Negative. Fact Sheet for Patients: SugarRoll.be Fact Sheet for Healthcare Providers: https://www.woods-mathews.com/ This test is not yet approved or cleared by the [place] FDA and  has been authorized for detection and/or diagnosis of SARS-CoV-2 by FDA under an Emergency Use Authorization (EUA). This EUA will remain  in effect (meaning this test can be used) for the duration of the COVID-19 declaration under Section 56 4(b)(1) of the Act, 21 U.S.C. section 360bbb-3(b)(1), unless the authorization is terminated or revoked sooner. Performed at [facility], [place] [address] [place],  [address]    Ct Chest W Contrast  Result Date: 07/31/2019 CLINICAL DATA:  Cough shortness of breath generalized weakness EXAM: CT CHEST WITH CONTRAST TECHNIQUE: Multidetector CT imaging of the chest was performed during intravenous contrast administration. CONTRAST:  37mL OMNIPAQUE IOHEXOL 300 MG/ML  SOLN COMPARISON:  July 08, 2017 FINDINGS: Cardiovascular: No filling defects are seen within the central or segmental  pulmonary arteries. Due to a large mass like area of consolidation in the left lower lobe and lingula there does however appear to be narrowing of the subsegmental lower lobe pulmonary branches. There is mild cardiomegaly. No definite evidence of right ventricular heart strain. Aortic atherosclerosis is noted. There is a patent 3 vessel arch. There is mild prominence to the main pulmonary arteries which could be due  to pulmonary artery hypertension. Mediastinum/Nodes: There is large subcarinal adenopathy measuring 1.7 cm in transverse dimension. The large soft tissue mass within the left lower lobe extends into the left hilum. There is also pre-vascular adenopathy measuring 1.5 cm in transverse dimension. The esophagus is unremarkable. Lungs/Pleura: Again noted is extensive centrilobular emphysematous changes, right worse than left. Along the major fissure in the right lung there is a spiculated nodular opacity which appears enlarged from the prior exam measuring 3.5 x 1.9 cm. Within the right lower lobe there is ground-glass opacities and interstitial thickening, which also extends into the right middle lobe. There is a large heterogeneous ill-defined soft tissue mass seen within the lingula and left lower lobe which extends into the left hilum. There is adjacent interstitial thickening and small nodular ill-defined opacity. There is also adjacent honeycombing and reticulonodular opacities in the left lower lung. The mass appears to invade the left hilum and mediastinum and compress the left lower lobe mainstem bronchus. There is a narrowing of subsegmental branches of the pulmonary arteries within the left lower lobe and lingula. Upper Abdomen: No acute abnormalities present in the visualized portions of the upper abdomen. Musculoskeletal: No chest wall abnormality. No acute or significant osseous findings. IMPRESSION: 1. New large soft tissue masslike consolidation within the left lower lobe and lingula that  likely represents pulmonary neoplasm, with possible superimposed infectious etiology. 2. 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 3. The masslike consolidation extends into the left hilum and mediastinum and causes compression of the left mainstem bronchus as well as left subsegmental pulmonary arterial branches. 4. Subcarinal and prevascular adenopathy. 5. Ill-defined ground-glass opacities with adjacent reticulonodular opacities in the right lower lung and right middle lobe which could represent lymphangitic carcinomatosis/inflammatory changes. 6. Ill-defined spiculated nodule measuring 3.5 x 1.9 cm along the right major fissure which has slightly increased in size from the prior exam, and could also represent a pulmonary neoplasm versus worsening scarring. 7. Extensive centrilobular emphysematous changes. 8.  Aortic Atherosclerosis (ICD10-I70.0). These results were called by telephone at the time of interpretation on 07/31/2019 at 7:18 pm to provider [facility] , who verbally acknowledged these results. Electronically Signed   By: Prudencio Pair M.D.   On: 07/31/2019 19:23   Dg Chest Port 1 View  Result Date: 07/31/2019 CLINICAL DATA:  Shortness of breath. EXAM: PORTABLE CHEST 1 VIEW COMPARISON:  Chest x-ray dated 09/26/2019 FINDINGS: There has been significant progression of the infiltrates in the left mid and lower lung zone with new infiltrate at the right lung base. There is increased fullness at the inferior aspect of the left hilum as well as at the left base medially. The patient has severe emphysematous changes in both upper lobes, right greater than left. No effusions. Aortic atherosclerosis. No acute bone abnormality. IMPRESSION: 1. Progressive infiltrates in the left mid and lower lung zone and new infiltrate at the right lung base. This could represent progressive  pneumonia but the possibility of lymphangitic tumor should also be considered. 2. New fullness at the inferior aspect of the left hilum as well as the left base medially. I am concerned that there may be an underlying mass in the left lung. CT scan of the chest with contrast recommended for further evaluation. 3. Emphysema. Electronically Signed   By: Lorriane Shire M.D.   On: 07/31/2019 15:02    Pending Labs Unresulted Labs (From admission, onward)   None  Vitals/Pain Today's Vitals   08/01/19 0546 08/01/19 0747 08/01/19 0800 08/01/19 0820  BP:   117/72   Pulse:   86   Resp:   (!) 9   Temp:      TempSrc:      SpO2:   92%   PainSc: Asleep Asleep  0-No pain    Isolation Precautions No active isolations  Medications Medications  methadone (DOLOPHINE) tablet 80 mg (has no administration in time range)  doxycycline (VIBRA-TABS) tablet 100 mg (100 mg Oral Given 08/01/19 1018)  amLODipine (NORVASC) tablet 5 mg (5 mg Oral Given 08/01/19 1018)  nicotine (NICODERM CQ - dosed in mg/24 hours) patch 14 mg (14 mg Transdermal Patch Applied 08/01/19 1021)  sertraline (ZOLOFT) tablet 100 mg (100 mg Oral Given 08/01/19 1018)  predniSONE (DELTASONE) tablet 10 mg (10 mg Oral Given 08/01/19 0818)  feeding supplement (ENSURE ENLIVE) (ENSURE ENLIVE) liquid 237 mL (has no administration in time range)  albuterol (PROVENTIL) (2.5 MG/3ML) 0.083% nebulizer solution 2.5 mg (has no administration in time range)  budesonide (PULMICORT) nebulizer solution 0.5 mg (0.5 mg Nebulization Not Given 08/01/19 1212)  heparin injection 5,000 Units (5,000 Units Subcutaneous Given 07/31/19 2117)  acetaminophen (TYLENOL) tablet 650 mg (has no administration in time range)    Or  acetaminophen (TYLENOL) suppository 650 mg (has no administration in time range)  hydrALAZINE (APRESOLINE) injection 5 mg (has no administration in time range)  gabapentin (NEURONTIN) capsule 800 mg (800 mg Oral Given 08/01/19 1017)   predniSONE (DELTASONE) tablet 10 mg (10 mg Oral Given 07/31/19 1522)  doxycycline (VIBRA-TABS) tablet 100 mg (100 mg Oral Given 07/31/19 1459)  albuterol (VENTOLIN HFA) 108 (90 Base) MCG/ACT inhaler 8 puff (8 puffs Inhalation Given 07/31/19 1457)  oxyCODONE-acetaminophen (PERCOCET/ROXICET) 5-325 MG per tablet 1 tablet (1 tablet Oral Given 07/31/19 1521)  ondansetron (ZOFRAN) injection 4 mg (4 mg Intravenous Given 07/31/19 1620)  iohexol (OMNIPAQUE) 300 MG/ML solution 75 mL (75 mLs Intravenous Contrast Given 07/31/19 1701)  methadone (DOLOPHINE) tablet 80 mg (80 mg Oral Given 07/31/19 1911)  ondansetron (ZOFRAN) injection 4 mg (4 mg Intravenous Given 07/31/19 2050)  ipratropium-albuterol (DUONEB) 0.5-2.5 (3) MG/3ML nebulizer solution 3 mL (3 mLs Nebulization Given 07/31/19 2152)  LORazepam (ATIVAN) injection 1 mg (1 mg Intravenous Given 07/31/19 2301)    Mobility walks Low fall risk   Focused Assessments    R Recommendations: See Admitting Provider Note  Report given to: Melida Quitter RN  Additional Notes:

## 2019-08-01 NOTE — Progress Notes (Signed)
Progress Note    Emily Moran  PFX:[id number] DOB: Feb 26, 1956  DOA: 07/31/2019 PCP: Nolene Ebbs, MD    Brief Narrative:     Medical records reviewed and are as summarized below:  Emily Moran is an 63 y.o. female with medical history significant of end-stage COPD on 3 L oxygen at baseline and palliative care, HCV status post treatment, degenerative joint disease, bipolar disorder, anxiety, depression, chronic pain on methadone, hypertension, tobacco use admitted to the hospital on 10/19 for end-stage COPD and multifocal pneumonia.  It was later thought that her presentation was more related to end-stage COPD than pneumonia.  She was discharged yesterday 10/22.  Patient states she has continued to feel very tired and short of breath since she left the hospital.  She could barely walk to her car.  She also experiences pain on both sides of her chest when she feels short of breath.  States she was previously using 3 L home oxygen but since leaving the hospital has increased it to 4 L.  CT scan of chest done and there is evidence of masslike consolidation causing compression of the left mainstem bronchus as well as left subsegmental pulmonary arterial branches. ? If patient is on Hospice like the chart says vs being followed by palliative care as the patient reports  Assessment/Plan:   Principal Problem:   SOB (shortness of breath) Active Problems:   TOBACCO ABUSE   HTN (hypertension)   Chronic pain syndrome   Advanced COPD (HCC)   Lung cancer (HCC)   Shortness of breath- with new lung mass -baseline advanced COPD and is on palliative care (? Hospice???) - CT chest with extensive findings concerning for malignancy and possible infection.  There is also evidence of masslike consolidation causing compression of the left mainstem bronchus as well as left subsegmental pulmonary arterial branches.   - supplemental oxygen requirement has increased from baseline -Normally uses 3 L,  now requiring 4 L -CT abdomen pelvis with contrast for further staging -will not consult oncology as there is no tissue diagnosis as of yet and patient is not sure she would like to pursue a diagnosis-- if she does so desire I think Pulmonary would be the place to start (vs IR) -palliative care consult-- ? Is she on hospice vs being followed by palliative care  advanced COPD -continue supplemental oxygen. -Continue prednisone 10 mg daily -continue nebs -Afebrile.  White blood cell count 16.4 in the setting of steroid use.  No signs of sepsis.  CT with findings concerning for possible infection.  She was treated with doxycycline during her recent hospitalization and discharged on this medication.  Continue doxycycline at this time -SARS-CoV-2 negative -Supplemental oxygen  Chronic pain syndrome -Continue home dose of methadone  Hypertension -Continue home amlodipine -Hydralazine PRN SBP >160  Tobacco use -Continue NicoDerm patch which was started during her recent hospitalization.  Physical deconditioning -PT evaluation -OOB in chair for meals     Family Communication/Anticipated D/C date and plan/Code Status   DVT prophylaxis: heparin Code Status: Dnr  Family Communication:  Disposition Plan: pending decision on lung mass work up   Medical Consultants:    Palliative care  Subjective:   Patient would like to discuss findings with family to decide if she would like to pursue a diagnosis  Objective:    Vitals:   08/01/19 0430 08/01/19 0500 08/01/19 0530 08/01/19 0800  BP: 110/77 116/84  117/72  Pulse: 94 80 88 86  Resp: 17 (!)  6 (!) 6 (!) 9  Temp:      TempSrc:      SpO2: (!) 88% 92% 95% 92%   No intake or output data in the 24 hours ending 08/01/19 1003 There were no vitals filed for this visit.  Exam: In bed, chronically ill appearing Mucous membranes moist rrr On 4L O2, no increased work of breathing +BS, soft, NT A+OX3   Data Reviewed:   I  have personally reviewed following labs and imaging studies:  Labs: Labs show the following:   Basic Metabolic Panel: Recent Labs  Lab 07/27/19 1230 07/27/19 1922 07/28/19 0239 07/29/19 0808 07/30/19 0135 07/31/19 1058  NA 136  --  140 141 140 140  K 3.0* 2.8* 2.6* 3.3* 3.4* 4.1  CL 94*  --  101 103 103 102  CO2 29  --  29 26 27 26   GLUCOSE 104*  --  75 111* 82 161*  BUN 8  --  9 8 9 20   CREATININE 0.87  --  0.94 0.84 0.77 0.93  CALCIUM 8.8*  --  8.3* 8.6* 8.7* 9.3  MG  --  2.2  --  1.7 1.9  --   PHOS  --   --   --   --  3.5  --    GFR Estimated Creatinine Clearance: 55.2 mL/min (by C-G formula based on SCr of 0.93 mg/dL). Liver Function Tests: Recent Labs  Lab 07/29/19 0808 07/30/19 0135  AST 14*  --   ALT 9  --   ALKPHOS 88  --   BILITOT 0.3  --   PROT 6.5  --   ALBUMIN 2.4* 2.3*   No results for input(s): LIPASE, AMYLASE in the last 168 hours. No results for input(s): AMMONIA in the last 168 hours. Coagulation profile No results for input(s): INR, PROTIME in the last 168 hours.  CBC: Recent Labs  Lab 07/27/19 1230 07/28/19 0239 07/31/19 1058  WBC 14.3* 13.9* 16.4*  NEUTROABS  --  10.8*  --   HGB 11.5* 10.7* 11.4*  HCT 39.3 36.1 38.1  MCV 96.6 95.0 95.5  PLT 411* 325 392   Cardiac Enzymes: No results for input(s): CKTOTAL, CKMB, CKMBINDEX, TROPONINI in the last 168 hours. BNP (last 3 results) No results for input(s): PROBNP in the last 8760 hours. CBG: No results for input(s): GLUCAP in the last 168 hours. D-Dimer: No results for input(s): DDIMER in the last 72 hours. Hgb A1c: No results for input(s): HGBA1C in the last 72 hours. Lipid Profile: No results for input(s): CHOL, HDL, LDLCALC, TRIG, CHOLHDL, LDLDIRECT in the last 72 hours. Thyroid function studies: No results for input(s): TSH, T4TOTAL, T3FREE, THYROIDAB in the last 72 hours.  Invalid input(s): FREET3 Anemia work up: No results for input(s): VITAMINB12, FOLATE, FERRITIN, TIBC,  IRON, RETICCTPCT in the last 72 hours. Sepsis Labs: Recent Labs  Lab 07/27/19 1230 07/28/19 0239 07/31/19 0000 07/31/19 1058  PROCALCITON  --   --  0.36  --   WBC 14.3* 13.9*  --  16.4*    Microbiology Recent Results (from the past 240 hour(s))  SARS CORONAVIRUS 2 (TAT 6-24 HRS) Nasopharyngeal Nasopharyngeal Swab     Status: None   Collection Time: 07/27/19  1:22 PM   Specimen: Nasopharyngeal Swab  Result Value Ref Range Status   SARS Coronavirus 2 NEGATIVE NEGATIVE Final    Comment: (NOTE) SARS-CoV-2 target nucleic acids are NOT DETECTED. The SARS-CoV-2 RNA is generally detectable in upper and lower respiratory specimens during  the acute phase of infection. Negative results do not preclude SARS-CoV-2 infection, do not rule out co-infections with other pathogens, and should not be used as the sole basis for treatment or other patient management decisions. Negative results must be combined with clinical observations, patient history, and epidemiological information. The expected result is Negative. Fact Sheet for Patients: SugarRoll.be Fact Sheet for Healthcare Providers: https://www.woods-mathews.com/ This test is not yet approved or cleared by the [place] FDA and  has been authorized for detection and/or diagnosis of SARS-CoV-2 by FDA under an Emergency Use Authorization (EUA). This EUA will remain  in effect (meaning this test can be used) for the duration of the COVID-19 declaration under Section 56 4(b)(1) of the Act, 21 U.S.C. section 360bbb-3(b)(1), unless the authorization is terminated or revoked sooner. Performed at [facility], [place] [address] [place], [place] [address]   Blood culture (routine x 2)     Status: None   Collection Time: 07/27/19  3:55 PM   Specimen: BLOOD  Result Value Ref Range Status   Specimen Description BLOOD RIGHT UPPER ARM  Final   Special Requests   Final    BOTTLES DRAWN AEROBIC  ONLY Blood Culture results may not be optimal due to an inadequate volume of blood received in culture bottles   Culture   Final    NO GROWTH 5 DAYS Performed at [facility], [place] [address], [place], [place] [address]    Report Status 08/01/2019 FINAL  Final  Blood culture (routine x 2)     Status: None   Collection Time: 07/27/19  4:21 PM   Specimen: BLOOD LEFT FOREARM  Result Value Ref Range Status   Specimen Description BLOOD LEFT FOREARM  Final   Special Requests   Final    BOTTLES DRAWN AEROBIC AND ANAEROBIC Blood Culture adequate volume   Culture   Final    NO GROWTH 5 DAYS Performed at [facility], [place] [address], [place], [place] [address]    Report Status 08/01/2019 FINAL  Final  SARS CORONAVIRUS 2 (TAT 6-24 HRS) Nasopharyngeal Nasopharyngeal Swab     Status: None   Collection Time: 07/31/19  8:53 PM   Specimen: Nasopharyngeal Swab  Result Value Ref Range Status   SARS Coronavirus 2 NEGATIVE NEGATIVE Final    Comment: (NOTE) SARS-CoV-2 target nucleic acids are NOT DETECTED. The SARS-CoV-2 RNA is generally detectable in upper and lower respiratory specimens during the acute phase of infection. Negative results do not preclude SARS-CoV-2 infection, do not rule out co-infections with other pathogens, and should not be used as the sole basis for treatment or other patient management decisions. Negative results must be combined with clinical observations, patient history, and epidemiological information. The expected result is Negative. Fact Sheet for Patients: SugarRoll.be Fact Sheet for Healthcare Providers: https://www.woods-mathews.com/ This test is not yet approved or cleared by the [place] FDA and  has been authorized for detection and/or diagnosis of SARS-CoV-2 by FDA under an Emergency Use Authorization (EUA). This EUA will remain  in effect (meaning this test can be used) for the duration of the COVID-19  declaration under Section 56 4(b)(1) of the Act, 21 U.S.C. section 360bbb-3(b)(1), unless the authorization is terminated or revoked sooner. Performed at [facility], [place] [address], [place], [place] [address]     Procedures and diagnostic studies:  Ct Chest W Contrast  Result Date: 07/31/2019 CLINICAL DATA:  Cough shortness of breath generalized weakness EXAM: CT CHEST  WITH CONTRAST TECHNIQUE: Multidetector CT imaging of the chest was performed during intravenous contrast administration. CONTRAST:  32mL OMNIPAQUE IOHEXOL 300 MG/ML  SOLN COMPARISON:  July 08, 2017 FINDINGS: Cardiovascular: No filling defects are seen within the central or segmental pulmonary arteries. Due to a large mass like area of consolidation in the left lower lobe and lingula there does however appear to be narrowing of the subsegmental lower lobe pulmonary branches. There is mild cardiomegaly. No definite evidence of right ventricular heart strain. Aortic atherosclerosis is noted. There is a patent 3 vessel arch. There is mild prominence to the main pulmonary arteries which could be due to pulmonary artery hypertension. Mediastinum/Nodes: There is large subcarinal adenopathy measuring 1.7 cm in transverse dimension. The large soft tissue mass within the left lower lobe extends into the left hilum. There is also pre-vascular adenopathy measuring 1.5 cm in transverse dimension. The esophagus is unremarkable. Lungs/Pleura: Again noted is extensive centrilobular emphysematous changes, right worse than left. Along the major fissure in the right lung there is a spiculated nodular opacity which appears enlarged from the prior exam measuring 3.5 x 1.9 cm. Within the right lower lobe there is ground-glass opacities and interstitial thickening, which also extends into the right middle lobe. There is a large heterogeneous ill-defined soft tissue mass seen within the lingula and left lower lobe which extends into the left hilum.  There is adjacent interstitial thickening and small nodular ill-defined opacity. There is also adjacent honeycombing and reticulonodular opacities in the left lower lung. The mass appears to invade the left hilum and mediastinum and compress the left lower lobe mainstem bronchus. There is a narrowing of subsegmental branches of the pulmonary arteries within the left lower lobe and lingula. Upper Abdomen: No acute abnormalities present in the visualized portions of the upper abdomen. Musculoskeletal: No chest wall abnormality. No acute or significant osseous findings. IMPRESSION: 1. New large soft tissue masslike consolidation within the left lower lobe and lingula that likely represents pulmonary neoplasm, with possible superimposed infectious etiology. 2. 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 3. The masslike consolidation extends into the left hilum and mediastinum and causes compression of the left mainstem bronchus as well as left subsegmental pulmonary arterial branches. 4. Subcarinal and prevascular adenopathy. 5. Ill-defined ground-glass opacities with adjacent reticulonodular opacities in the right lower lung and right middle lobe which could represent lymphangitic carcinomatosis/inflammatory changes. 6. Ill-defined spiculated nodule measuring 3.5 x 1.9 cm along the right major fissure which has slightly increased in size from the prior exam, and could also represent a pulmonary neoplasm versus worsening scarring. 7. Extensive centrilobular emphysematous changes. 8.  Aortic Atherosclerosis (ICD10-I70.0). These results were called by telephone at the time of interpretation on 07/31/2019 at 7:18 pm to provider [facility] , who verbally acknowledged these results. Electronically Signed   By: Prudencio Pair M.D.   On: 07/31/2019 19:23   Dg Chest Port 1 View  Result Date:  07/31/2019 CLINICAL DATA:  Shortness of breath. EXAM: PORTABLE CHEST 1 VIEW COMPARISON:  Chest x-ray dated 09/26/2019 FINDINGS: There has been significant progression of the infiltrates in the left mid and lower lung zone with new infiltrate at the right lung base. There is increased fullness at the inferior aspect of the left hilum as well as at the left base medially. The patient has severe emphysematous changes in both upper lobes, right greater than left. No effusions. Aortic atherosclerosis. No acute  bone abnormality. IMPRESSION: 1. Progressive infiltrates in the left mid and lower lung zone and new infiltrate at the right lung base. This could represent progressive pneumonia but the possibility of lymphangitic tumor should also be considered. 2. New fullness at the inferior aspect of the left hilum as well as the left base medially. I am concerned that there may be an underlying mass in the left lung. CT scan of the chest with contrast recommended for further evaluation. 3. Emphysema. Electronically Signed   By: Lorriane Shire M.D.   On: 07/31/2019 15:02    Medications:    amLODipine  5 mg Oral Daily   budesonide  0.5 mg Nebulization BID   doxycycline  100 mg Oral Q12H   feeding supplement (ENSURE ENLIVE)  237 mL Oral TID BM   gabapentin  800 mg Oral QID   heparin  5,000 Units Subcutaneous Q8H   methadone  80 mg Oral Daily   nicotine  14 mg Transdermal Daily   predniSONE  10 mg Oral Q breakfast   sertraline  100 mg Oral Daily   Continuous Infusions:   LOS: 0 days   Geradine Girt  [facility]   How to contact the [facility] Attending or Consulting provider [place] or covering provider during after hours [place], for this patient?  1. Check the care team in [facility] and look for a) attending/consulting [facility] provider listed and b) the [facility] team listed 2. Log into [email or web address] and use [place]'s universal password to access. If you do not have the password, please contact the  hospital operator. 3. Locate the [facility] provider you are looking for under [facility] and page to a number that you can be directly reached. 4. If you still have difficulty reaching the provider, please page the [facility] (Director on Call) for the Hospitalists listed on amion for assistance.  08/01/2019, 10:03 AM

## 2019-08-02 ENCOUNTER — Inpatient Hospital Stay (HOSPITAL_COMMUNITY): Payer: Medicare Other

## 2019-08-02 DIAGNOSIS — F17200 Nicotine dependence, unspecified, uncomplicated: Secondary | ICD-10-CM

## 2019-08-02 DIAGNOSIS — R918 Other nonspecific abnormal finding of lung field: Secondary | ICD-10-CM

## 2019-08-02 DIAGNOSIS — Z515 Encounter for palliative care: Secondary | ICD-10-CM

## 2019-08-02 DIAGNOSIS — J9621 Acute and chronic respiratory failure with hypoxia: Secondary | ICD-10-CM

## 2019-08-02 MED ORDER — HYDROXYZINE HCL 10 MG PO TABS
10.0000 mg | ORAL_TABLET | Freq: Three times a day (TID) | ORAL | Status: DC | PRN
  Administered 2019-08-02: 10 mg via ORAL
  Filled 2019-08-02 (×3): qty 1

## 2019-08-02 MED ORDER — IOHEXOL 300 MG/ML  SOLN
100.0000 mL | Freq: Once | INTRAMUSCULAR | Status: AC | PRN
  Administered 2019-08-02: 100 mL via INTRAVENOUS

## 2019-08-02 MED ORDER — CLONAZEPAM 1 MG PO TABS
1.0000 mg | ORAL_TABLET | Freq: Two times a day (BID) | ORAL | Status: DC | PRN
  Administered 2019-08-02 – 2019-08-05 (×5): 1 mg via ORAL
  Filled 2019-08-02 (×6): qty 1

## 2019-08-02 NOTE — Progress Notes (Signed)
Progress Note    Emily Moran  BDZ:[id number] DOB: 09/07/1956  DOA: 07/31/2019 PCP: Nolene Ebbs, MD    Brief Narrative:     Medical records reviewed and are as summarized below:  Emily Moran is an 63 y.o. female with medical history significant of end-stage COPD on 3 L oxygen at baseline and palliative care, HCV status post treatment, degenerative joint disease, bipolar disorder, anxiety, depression, chronic pain on methadone, hypertension, tobacco use admitted to the hospital on 10/19 for end-stage COPD and multifocal pneumonia.  It was later thought that her presentation was more related to end-stage COPD than pneumonia.  She was discharged yesterday 10/22.  Patient states she has continued to feel very tired and short of breath since she left the hospital.  She could barely walk to her car.  She also experiences pain on both sides of her chest when she feels short of breath.  States she was previously using 3 L home oxygen but since leaving the hospital has increased it to 4 L.  CT scan of chest done and there is evidence of masslike consolidation causing compression of the left mainstem bronchus as well as left subsegmental pulmonary arterial branches. ? If patient is on Hospice like the chart says vs being followed by palliative care as the patient reports  Assessment/Plan:   Principal Problem:   SOB (shortness of breath) Active Problems:   TOBACCO ABUSE   HTN (hypertension)   Chronic pain syndrome   Advanced COPD (HCC)   Lung cancer (HCC)   Shortness of breath- with new lung mass/chronic hypoxic respiratory failure -baseline advanced COPD and is on palliative care (? Hospice???) - CT chest with extensive findings concerning for malignancy and possible infection.  There is also evidence of masslike consolidation causing compression of the left mainstem bronchus as well as left subsegmental pulmonary arterial branches.   - supplemental oxygen requirement has increased  from baseline -Normally uses 3 L, now requiring 4 L -CT abdomen pelvis with contrast for further staging -will not consult oncology as there is no tissue diagnosis as of yet and patient is not sure she would like to pursue a diagnosis-- if she does so desire I think Pulmonary would be the place to start (vs IR) -palliative care consult-- ? Is she on hospice vs being followed by palliative care  advanced COPD -continue supplemental oxygen. -Continue prednisone 10 mg daily -continue nebs -Afebrile.  White blood cell count 16.4 in the setting of steroid use.  No signs of sepsis.  CT with findings concerning for possible infection.  She was treated with doxycycline during her recent hospitalization and discharged on this medication.  Continue doxycycline at this time -SARS-CoV-2 negative -Supplemental oxygen  Chronic pain syndrome -Continue home dose of methadone  Hypertension -BP lower end of normal -decrease BP medications  Anxiety -will try hydroxyzine   Tobacco use -Continue NicoDerm patch which was started during her recent hospitalization.  Physical deconditioning -PT evaluation -OOB in chair for meals     Family Communication/Anticipated D/C date and plan/Code Status   DVT prophylaxis: heparin Code Status: Dnr  Family Communication:  Disposition Plan: pending decision on lung mass work up   Medical Consultants:    Palliative care  Subjective:   Says her family is asking "what stage cancer she has"  Objective:    Vitals:   08/01/19 1400 08/01/19 1504 08/01/19 2159 08/02/19 0533  BP:  102/65 121/78 98/70  Pulse:  88 86 86  Resp:  16 15 16   Temp:  98 F (36.7 C) 98.3 F (36.8 C) 98.8 F (37.1 C)  TempSrc:  Oral Oral Oral  SpO2: (!) 89% 91% 92% 93%  Weight:      Height:        Intake/Output Summary (Last 24 hours) at 08/02/2019 1203 Last data filed at 08/02/2019 1156 Gross per 24 hour  Intake 340 ml  Output --  Net 340 ml   Filed Weights    08/01/19 1305  Weight: 44.3 kg    Exam: In bed, chronically ill appearing No increased work of breathing on O2 [place] No LE edema A+OX3   Data Reviewed:   I have personally reviewed following labs and imaging studies:  Labs: Labs show the following:   Basic Metabolic Panel: Recent Labs  Lab 07/27/19 1230 07/27/19 1922 07/28/19 0239 07/29/19 0808 07/30/19 0135 07/31/19 1058  NA 136  --  140 141 140 140  K 3.0* 2.8* 2.6* 3.3* 3.4* 4.1  CL 94*  --  101 103 103 102  CO2 29  --  29 26 27 26   GLUCOSE 104*  --  75 111* 82 161*  BUN 8  --  9 8 9 20   CREATININE 0.87  --  0.94 0.84 0.77 0.93  CALCIUM 8.8*  --  8.3* 8.6* 8.7* 9.3  MG  --  2.2  --  1.7 1.9  --   PHOS  --   --   --   --  3.5  --    GFR Estimated Creatinine Clearance: 44.4 mL/min (by C-G formula based on SCr of 0.93 mg/dL). Liver Function Tests: Recent Labs  Lab 07/29/19 0808 07/30/19 0135  AST 14*  --   ALT 9  --   ALKPHOS 88  --   BILITOT 0.3  --   PROT 6.5  --   ALBUMIN 2.4* 2.3*   No results for input(s): LIPASE, AMYLASE in the last 168 hours. No results for input(s): AMMONIA in the last 168 hours. Coagulation profile No results for input(s): INR, PROTIME in the last 168 hours.  CBC: Recent Labs  Lab 07/27/19 1230 07/28/19 0239 07/31/19 1058  WBC 14.3* 13.9* 16.4*  NEUTROABS  --  10.8*  --   HGB 11.5* 10.7* 11.4*  HCT 39.3 36.1 38.1  MCV 96.6 95.0 95.5  PLT 411* 325 392   Cardiac Enzymes: No results for input(s): CKTOTAL, CKMB, CKMBINDEX, TROPONINI in the last 168 hours. BNP (last 3 results) No results for input(s): PROBNP in the last 8760 hours. CBG: No results for input(s): GLUCAP in the last 168 hours. D-Dimer: No results for input(s): DDIMER in the last 72 hours. Hgb A1c: No results for input(s): HGBA1C in the last 72 hours. Lipid Profile: No results for input(s): CHOL, HDL, LDLCALC, TRIG, CHOLHDL, LDLDIRECT in the last 72 hours. Thyroid function studies: No results for  input(s): TSH, T4TOTAL, T3FREE, THYROIDAB in the last 72 hours.  Invalid input(s): FREET3 Anemia work up: No results for input(s): VITAMINB12, FOLATE, FERRITIN, TIBC, IRON, RETICCTPCT in the last 72 hours. Sepsis Labs: Recent Labs  Lab 07/27/19 1230 07/28/19 0239 07/31/19 0000 07/31/19 1058  PROCALCITON  --   --  0.36  --   WBC 14.3* 13.9*  --  16.4*    Microbiology Recent Results (from the past 240 hour(s))  SARS CORONAVIRUS 2 (TAT 6-24 HRS) Nasopharyngeal Nasopharyngeal Swab     Status: None   Collection Time: 07/27/19  1:22 PM   Specimen:  Nasopharyngeal Swab  Result Value Ref Range Status   SARS Coronavirus 2 NEGATIVE NEGATIVE Final    Comment: (NOTE) SARS-CoV-2 target nucleic acids are NOT DETECTED. The SARS-CoV-2 RNA is generally detectable in upper and lower respiratory specimens during the acute phase of infection. Negative results do not preclude SARS-CoV-2 infection, do not rule out co-infections with other pathogens, and should not be used as the sole basis for treatment or other patient management decisions. Negative results must be combined with clinical observations, patient history, and epidemiological information. The expected result is Negative. Fact Sheet for Patients: SugarRoll.be Fact Sheet for Healthcare Providers: https://www.woods-mathews.com/ This test is not yet approved or cleared by the [place] FDA and  has been authorized for detection and/or diagnosis of SARS-CoV-2 by FDA under an Emergency Use Authorization (EUA). This EUA will remain  in effect (meaning this test can be used) for the duration of the COVID-19 declaration under Section 56 4(b)(1) of the Act, 21 U.S.C. section 360bbb-3(b)(1), unless the authorization is terminated or revoked sooner. Performed at [facility], [place] [address] [place], [place] [address]   Blood culture (routine x 2)     Status: None   Collection Time:  07/27/19  3:55 PM   Specimen: BLOOD  Result Value Ref Range Status   Specimen Description BLOOD RIGHT UPPER ARM  Final   Special Requests   Final    BOTTLES DRAWN AEROBIC ONLY Blood Culture results may not be optimal due to an inadequate volume of blood received in culture bottles   Culture   Final    NO GROWTH 5 DAYS Performed at [facility], [place] [address], [place], [place] [address]    Report Status 08/01/2019 FINAL  Final  Blood culture (routine x 2)     Status: None   Collection Time: 07/27/19  4:21 PM   Specimen: BLOOD LEFT FOREARM  Result Value Ref Range Status   Specimen Description BLOOD LEFT FOREARM  Final   Special Requests   Final    BOTTLES DRAWN AEROBIC AND ANAEROBIC Blood Culture adequate volume   Culture   Final    NO GROWTH 5 DAYS Performed at [facility], [place] [address] [place], [place] [address]    Report Status 08/01/2019 FINAL  Final  SARS CORONAVIRUS 2 (TAT 6-24 HRS) Nasopharyngeal Nasopharyngeal Swab     Status: None   Collection Time: 07/31/19  8:53 PM   Specimen: Nasopharyngeal Swab  Result Value Ref Range Status   SARS Coronavirus 2 NEGATIVE NEGATIVE Final    Comment: (NOTE) SARS-CoV-2 target nucleic acids are NOT DETECTED. The SARS-CoV-2 RNA is generally detectable in upper and lower respiratory specimens during the acute phase of infection. Negative results do not preclude SARS-CoV-2 infection, do not rule out co-infections with other pathogens, and should not be used as the sole basis for treatment or other patient management decisions. Negative results must be combined with clinical observations, patient history, and epidemiological information. The expected result is Negative. Fact Sheet for Patients: SugarRoll.be Fact Sheet for Healthcare Providers: https://www.woods-mathews.com/ This test is not yet approved or cleared by the [place] FDA and  has been authorized for detection  and/or diagnosis of SARS-CoV-2 by FDA under an Emergency Use Authorization (EUA). This EUA will remain  in effect (meaning this test can be used) for the duration of the COVID-19 declaration under Section 56 4(b)(1) of the Act, 21 U.S.C. section 360bbb-3(b)(1), unless the authorization is terminated or revoked sooner. Performed  at [facility], [place] [address] [place], [place] [address]     Procedures and diagnostic studies:  Ct Chest W Contrast  Result Date: 07/31/2019 CLINICAL DATA:  Cough shortness of breath generalized weakness EXAM: CT CHEST WITH CONTRAST TECHNIQUE: Multidetector CT imaging of the chest was performed during intravenous contrast administration. CONTRAST:  36mL OMNIPAQUE IOHEXOL 300 MG/ML  SOLN COMPARISON:  July 08, 2017 FINDINGS: Cardiovascular: No filling defects are seen within the central or segmental pulmonary arteries. Due to a large mass like area of consolidation in the left lower lobe and lingula there does however appear to be narrowing of the subsegmental lower lobe pulmonary branches. There is mild cardiomegaly. No definite evidence of right ventricular heart strain. Aortic atherosclerosis is noted. There is a patent 3 vessel arch. There is mild prominence to the main pulmonary arteries which could be due to pulmonary artery hypertension. Mediastinum/Nodes: There is large subcarinal adenopathy measuring 1.7 cm in transverse dimension. The large soft tissue mass within the left lower lobe extends into the left hilum. There is also pre-vascular adenopathy measuring 1.5 cm in transverse dimension. The esophagus is unremarkable. Lungs/Pleura: Again noted is extensive centrilobular emphysematous changes, right worse than left. Along the major fissure in the right lung there is a spiculated nodular opacity which appears enlarged from the prior exam measuring 3.5 x 1.9 cm. Within the right lower lobe there is ground-glass opacities and interstitial thickening, which  also extends into the right middle lobe. There is a large heterogeneous ill-defined soft tissue mass seen within the lingula and left lower lobe which extends into the left hilum. There is adjacent interstitial thickening and small nodular ill-defined opacity. There is also adjacent honeycombing and reticulonodular opacities in the left lower lung. The mass appears to invade the left hilum and mediastinum and compress the left lower lobe mainstem bronchus. There is a narrowing of subsegmental branches of the pulmonary arteries within the left lower lobe and lingula. Upper Abdomen: No acute abnormalities present in the visualized portions of the upper abdomen. Musculoskeletal: No chest wall abnormality. No acute or significant osseous findings. IMPRESSION: 1. New large soft tissue masslike consolidation within the left lower lobe and lingula that likely represents pulmonary neoplasm, with possible superimposed infectious etiology. 2. 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 3. The masslike consolidation extends into the left hilum and mediastinum and causes compression of the left mainstem bronchus as well as left subsegmental pulmonary arterial branches. 4. Subcarinal and prevascular adenopathy. 5. Ill-defined ground-glass opacities with adjacent reticulonodular opacities in the right lower lung and right middle lobe which could represent lymphangitic carcinomatosis/inflammatory changes. 6. Ill-defined spiculated nodule measuring 3.5 x 1.9 cm along the right major fissure which has slightly increased in size from the prior exam, and could also represent a pulmonary neoplasm versus worsening scarring. 7. Extensive centrilobular emphysematous changes. 8.  Aortic Atherosclerosis (ICD10-I70.0). These results were called by telephone at the time of interpretation on 07/31/2019 at 7:18 pm to  provider [facility] , who verbally acknowledged these results. Electronically Signed   By: Prudencio Pair M.D.   On: 07/31/2019 19:23   Dg Chest Port 1 View  Result Date: 07/31/2019 CLINICAL DATA:  Shortness of breath. EXAM: PORTABLE CHEST 1 VIEW COMPARISON:  Chest x-ray dated 09/26/2019 FINDINGS: There has been significant progression of the infiltrates in the left mid and lower lung zone with new infiltrate at the right lung  base. There is increased fullness at the inferior aspect of the left hilum as well as at the left base medially. The patient has severe emphysematous changes in both upper lobes, right greater than left. No effusions. Aortic atherosclerosis. No acute bone abnormality. IMPRESSION: 1. Progressive infiltrates in the left mid and lower lung zone and new infiltrate at the right lung base. This could represent progressive pneumonia but the possibility of lymphangitic tumor should also be considered. 2. New fullness at the inferior aspect of the left hilum as well as the left base medially. I am concerned that there may be an underlying mass in the left lung. CT scan of the chest with contrast recommended for further evaluation. 3. Emphysema. Electronically Signed   By: Lorriane Shire M.D.   On: 07/31/2019 15:02    Medications:    budesonide  0.5 mg Nebulization BID   doxycycline  100 mg Oral Q12H   feeding supplement (ENSURE ENLIVE)  237 mL Oral TID BM   gabapentin  800 mg Oral QID   heparin  5,000 Units Subcutaneous Q8H   methadone  80 mg Oral Daily   nicotine  14 mg Transdermal Daily   predniSONE  10 mg Oral Q breakfast   sertraline  100 mg Oral Daily   Continuous Infusions:   LOS: 1 day   Geradine Girt  [facility]   How to contact the [facility] Attending or Consulting provider [place] or covering provider during after hours [place], for this patient?  1. Check the care team in [facility] and look for a) attending/consulting [facility] provider listed and b) the [facility] team  listed 2. Log into [email or web address] and use [place]'s universal password to access. If you do not have the password, please contact the hospital operator. 3. Locate the [facility] provider you are looking for under [facility] and page to a number that you can be directly reached. 4. If you still have difficulty reaching the provider, please page the [facility] (Director on Call) for the Hospitalists listed on amion for assistance.  08/02/2019, 12:03 PM

## 2019-08-02 NOTE — Consult Note (Signed)
Consultation Note Date: 08/02/2019   Patient Name: Emily Moran  DOB: Feb 11, 1956  MRN: [id number]  Age / Sex: 63 y.o., female  PCP: Nolene Ebbs, MD Referring Physician: Geradine Girt, DO  Reason for Consultation: Establishing goals of care, Non pain symptom management and Psychosocial/spiritual support  HPI/Patient Profile: 63 y.o. female  with past medical history of end stage COPD on 4L at home, chronic pain on methadone, HCV (treated), bipolar disease, right hip replacement who was admitted on 07/31/2019 with shortness of breath.  Emily Moran had just been discharged from [facility] the day prior after being treated for multifocal pneumonia.    CT chest imaging reveals a spiculated chest mass compressing the mainstem bronchus with significant mediastinal lymphadenopathy.   Of note the patient was being followed by [facility] for end stage COPD.   Clinical Assessment and Goals of Care:  I have reviewed medical records including EPIC notes, labs and imaging, received report from Dr. Eliseo Squires, assessed the patient and then met at the bedside along with her sister [name]  to discuss diagnosis prognosis, [place], EOL wishes, disposition and options.  I also spoke with her daughter Joellen Jersey on the phone prior to seeing Emily Moran.  I introduced Palliative Medicine as specialized medical care for people living with serious illness. It focuses on providing relief from the symptoms and stress of a serious illness. The goal is to improve quality of life for both the patient and the family.  We discussed a brief life review of the patient. 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 She has 3 siblings.and had two children.  Her son died in 01/29/2019.  She lives at home with Joellen Jersey and her 25 year old grand daughter.  She has another grandchild who is two years old.  She describes  herself as very spiritual and believes in God.   Prior to [employer] lived at [facility] living facility and was very happy there.  She left when COVID started and moved in with her daughter Joellen Jersey.  Emily Moran works during the day so Emily Moran is at home alone.  Laurence describes her days as lonely.   Avaree tells me she goes to the methadone clinic daily for her dose of methadone.  As far as functional and nutritional status Joellen Jersey states prior to her last admission the patient was completely independent.  When she came home between hospitalizations she was so short of breath that she could not walk.  Consequently she returned to the hospital.  The patient reports losing 20 pounds in the past 6 months unintentionally.  She reports that she has to smoke marijuana or she just has no desire to eat.  The patient was an active smoker but has smoked very little recently as she is so short of breath.   We discussed her current illness and what it means in the larger context of her on-going co-morbidities.  Natural disease trajectory and expectations at EOL were discussed.  Emily Moran is surprised and disappointed to learn she most likely has cancer.  We are waiting for the results of the abd/pelvis CT scan to learn whether or not there are distant metastasis.  Emily Moran felt that her mother would most likely want to avoid biopsy and treatment and simply come home with hospice.  In talking to [id number] she may want to consider further evaluation and possible treatment.     After talking with radiology I learned there is likely cancer in both lungs as well as the mediastinum.  This makes it advanced and inoperable.  Emily Moran would need an oncology consultation for definitive answers, but I explained that odds are this cancer is not curable and treatment would most likely be palliative.  Emily Moran would like to consider her options before making a decision about biopsy.    We discussed her anxiety which is high at baseline and is  understandably now much higher.  Per outpatient palliative care notes Klonopin has worked well for her in the past.  Palliative care vs Hospice care was explained to the family.  Patient and family were encouraged to call with questions or concerns.  Primary Decision Maker:  PATIENT    SUMMARY OF RECOMMENDATIONS    HCPOA needs to be determined.   Legally this would fall to Joellen Jersey (with whom the patient lives).  The patient's sister [name] introduced herself as the Scientist, research (medical).  I did not feel comfortable asking [place] about this in front of Emily Moran, but I feel strongly that it needs to be sorted out and documented.  Patient needs to understand final results of CT imaging.  She wanted to talk with Joellen Jersey before making a decision regarding biopsy.  I wonder if rad / onc may be able to shrink the primary tumor relieving pressure on the mainstem bronchus without a biopsy.  PMT will continue to follow with you  Code Status/Advance Care Planning:  DNR   Symptom Management:   Klonopin 1 mg bid PRN  Continue methadone  If she opts for a Hospice approach consider whether dexamethasone would be beneficial for appetite and breathing.   Psycho-social/Spiritual:   Desire for further Chaplaincy support:  Welcomed for prayer/support and needed for HCPOA documentation  Prognosis:  To be determined.  If she opts to avoid aggressive treatment it is reasonable to give a prognosis of less than 6 months given end stage COPD with back to back hospitalizations, rapid decline in function, increased oxygen requirements, 20 lg wt loss and probable advanced stage lung cancer.   Discharge Planning: To Be Determined  Home vs ALF.  Continued [facility] vs [facility].      Primary Diagnoses: Present on Admission: . SOB (shortness of breath) . Chronic pain syndrome . HTN (hypertension) . TOBACCO ABUSE . Lung cancer ([place])   I have reviewed the medical record,  interviewed the patient and family, and examined the patient. The following aspects are pertinent.  Past Medical History:  Diagnosis Date  . Anemia    "as a child"  . Anxiety   . Arthritis    "shoulders; back" (03/16/2015)  . Chronic back pain   . Chronic bronchitis ([place])    "I basically keep it" (03/16/2015)  . Chronic pain   . COPD (chronic obstructive pulmonary disease) ([place])   . DDD (degenerative disc disease), lumbar   . Degenerative disc disease   . Depression   . Ectatic aorta ([place]) 07/08/2017  . Family history of adverse reaction to anesthesia    "  my mother had an allergic reaction when she had kidney removed back in the '60's or '70's""  . Headache    "monthly; need glasses; comes on when I'm stressed or get too tired" (03/16/2015)  . Hepatitis C   . History of stomach ulcers   . Hypertension   . Kidney stone   . MRSA carrier 07/09/2017  . On home O2    "~ 3L once/wk and prn" (03/16/2015)  . Pain management   . Pneumonia    "@ least once/yr for the past 8 yrs" (03/16/2015)  . Spinal stenosis   . Tobacco use    Social History   Socioeconomic History  . Marital status: Legally Separated    Spouse name: Not on file  . Number of children: Not on file  . Years of education: Not on file  . Highest education level: Not on file  Occupational History  . Occupation: disabled  Social Needs  . Financial resource strain: Not on file  . Food insecurity    Worry: Not on file    Inability: Not on file  . Transportation needs    Medical: Not on file    Non-medical: Not on file  Tobacco Use  . Smoking status: Former Smoker    Packs/day: 1.00    Years: 47.00    Pack years: 47.00    Types: Cigarettes    Quit date: 07/09/2019    Years since quitting: 0.0  . Smokeless tobacco: Never Used  . Tobacco comment: Reports she is no longer able to smoke  Substance and Sexual Activity  . Alcohol use: No  . Drug use: Yes    Types: Marijuana    Comment: History of narcotic abuse,  [place] on Methadone; uses marijuana   . Sexual activity: Not Currently  Lifestyle  . Physical activity    Days per week: Not on file    Minutes per session: Not on file  . Stress: Not on file  Relationships  . Social Herbalist on phone: Not on file    Gets together: Not on file    Attends religious service: Not on file    Active member of club or organization: Not on file    Attends meetings of clubs or organizations: Not on file    Relationship status: Not on file  Other Topics Concern  . Not on file  Social History Narrative  . Not on file   Family History  Problem Relation Age of Onset  . Cancer Mother   . Emphysema Mother   . Heart failure Father   . Stroke Father    Scheduled Meds: . budesonide  0.5 mg Nebulization BID  . doxycycline  100 mg Oral Q12H  . feeding supplement (ENSURE ENLIVE)  237 mL Oral TID BM  . gabapentin  800 mg Oral QID  . heparin  5,000 Units Subcutaneous Q8H  . methadone  80 mg Oral Daily  . nicotine  14 mg Transdermal Daily  . predniSONE  10 mg Oral Q breakfast  . sertraline  100 mg Oral Daily   Continuous Infusions: PRN Meds:.acetaminophen **OR** acetaminophen, albuterol, clonazePAM, hydrALAZINE, hydrOXYzine, traZODone Allergies  Allergen Reactions  . Aspirin     Upset stomach   Review of Systems chronic back and hip pain, SOB, DOE, anorexia.  Denies constipation, insomnia  Physical Exam  Thin frail female with pallor Awake, alert, coherent, pleasant but moderately anxious Resp no distress on 4L   Vital  Signs: BP 135/85 (BP Location: Left Arm)   Pulse 85   Temp 97.8 F (36.6 C) (Oral)   Resp (!) 22   Ht '5\' 7"'  (1.702 m)   Wt 44.3 kg   SpO2 (!) 89%   BMI 15.30 kg/m  Pain Scale: 0-10   Pain Score: Asleep   SpO2: SpO2: (!) 89 % O2 Device:SpO2: (!) 89 % O2 Flow Rate: .O2 Flow Rate (L/min): 4 L/min  IO: Intake/output summary:   Intake/Output Summary (Last 24 hours) at 08/02/2019 1544 Last  data filed at 08/02/2019 1156 Gross per 24 hour  Intake 340 ml  Output -  Net 340 ml    LBM:   Baseline Weight: Weight: 44.3 kg Most recent weight: Weight: 44.3 kg     Palliative Assessment/Data: 40%     Time In: 4:00 Time Out: 5:10 Time Total: 70 min Visit consisted of counseling and education dealing with the complex and emotionally intense issues surrounding the need for palliative care and symptom management in the setting of serious and potentially life-threatening illness. Greater than 50%  of this time was spent counseling and coordinating care related to the above assessment and plan.  Signed by: Florentina Jenny, PA-C Palliative Medicine Pager: [phone]  Please contact Palliative Medicine Team phone at [phone] for questions and concerns.  For individual provider: See Shea Evans

## 2019-08-02 NOTE — Evaluation (Signed)
Occupational Therapy Evaluation Patient Details Name: Emily Moran MRN: [id number] DOB: [birth year] Today's Date: 08/02/2019    History of Present Illness 63 y.o. female admitted on 07/31/19 for increased SOB and progressive weakness after bing d/c from [facility] on 07/30/19.  CT scan this admission revealed new lung mass.  COVID (-) and pt active hospice patient.  Pt with other significant PMH of tobacco use, spinal stenosis, home O2, HTN, hepatitis C, DDD, COPD, anemia, R femur IM nail, back surgery, ACDF.     Clinical Impression   Pt was independent in self care PTA and assisted for IADL. She uses 3-5L 02 depending on exertion at baseline. Pt presents with decreased activity tolerance and requires supervision for safety for OOB activity. Began education in energy conservation and provided handout. Pt states she plans to go back on hospice care upon discharge. Will follow acutely.    Follow Up Recommendations  Home health OT(Return home with hospice care?)    Equipment Recommendations  Tub/shower seat    Recommendations for Other Services       Precautions / Restrictions Precautions Precautions: Fall;Other (comment) Precaution Comments: monitor O2 sats Restrictions Weight Bearing Restrictions: No      Mobility Bed Mobility Overal bed mobility: Modified Independent             General bed mobility comments: HOB up slightly  Transfers Overall transfer level: Needs assistance Equipment used: None Transfers: Sit to/from Stand Sit to Stand: Supervision         General transfer comment: for safety    Balance Overall balance assessment: Needs assistance   Sitting balance-Leahy Scale: Good Sitting balance - Comments: no LOB donning socks     Standing balance-Leahy Scale: Fair                             ADL either performed or assessed with clinical judgement   ADL Overall ADL's : Needs assistance/impaired Eating/Feeding: Independent;Sitting    Grooming: Wash/dry hands;Standing;Supervision/safety   Upper Body Bathing: Set up;Sitting Upper Body Bathing Details (indicate cue type and reason): recommendend long handled bath sponge for back Lower Body Bathing: Supervison/ safety;Sit to/from stand Lower Body Bathing Details (indicate cue type and reason): recommended long handled bath sponge Upper Body Dressing : Set up;Sitting   Lower Body Dressing: Supervision/safety;Sitting/lateral leans;Sit to/from stand;Set up Lower Body Dressing Details (indicate cue type and reason): no difficulty managing socks, bringing foot up Toilet Transfer: Supervision/safety;Regular Toilet   Toileting- Clothing Manipulation and Hygiene: Supervision/safety;Sit to/from Nurse, children's Details (indicate cue type and reason): pt requesting shower seat Functional mobility during ADLs: Supervision/safety General ADL Comments: Educated in pursed lip breathing and energy conservation strategies.     Vision Patient Visual Report: No change from baseline       Perception     Praxis      Pertinent Vitals/Pain Pain Assessment: Faces Faces Pain Scale: Hurts little more Pain Location: pt with chronic, generalized pain Pain Descriptors / Indicators: Aching;Grimacing;Guarding;Discomfort;Crying Pain Intervention(s): Monitored during session;Repositioned     Hand Dominance Right   Extremity/Trunk Assessment Upper Extremity Assessment Upper Extremity Assessment: Overall WFL for tasks assessed   Lower Extremity Assessment Lower Extremity Assessment: Defer to PT evaluation   Cervical / Trunk Assessment Cervical / Trunk Assessment: Other exceptions Cervical / Trunk Exceptions: pt with h/o back surgery and neck surgery, chronic pain   Communication Communication Communication: No difficulties   Cognition Arousal/Alertness: Awake/alert  Behavior During Therapy: WFL for tasks assessed/performed Overall Cognitive Status: Within Functional  Limits for tasks assessed                                     General Comments       Exercises     Shoulder Instructions      Home Living Family/patient expects to be discharged to:: Private residence Living Arrangements: Children;Other relatives(daughter an 17 year old granddaughter) Available Help at Discharge: Family;Available PRN/intermittently(daughter works) Type of Home: House Home Access: Stairs to enter [employer] of Steps: 5 Entrance Stairs-Rails: Right Home Layout: Two level;Able to live on main level with bedroom/bathroom     Bathroom Shower/Tub: Occupational psychologist: Standard     Home Equipment: Cane - single point;Bedside commode;Walker - 2 wheels   Additional Comments: home O2      Prior Functioning/Environment Level of Independence: Needs assistance  Gait / Transfers Assistance Needed: mod I for mobility "sometimes" uses cane vs walker ADL's / Homemaking Assistance Needed: pt needing assist for IADL management. Able to bath, dress, groom ind with increased time. Has been sponge bathing.   Comments: typically uses 3 L at rest and up to 5 L with exertion        OT Problem List: Decreased knowledge of use of DME or AE;Decreased activity tolerance;Cardiopulmonary status limiting activity;Impaired balance (sitting and/or standing)      OT Treatment/Interventions: Energy conservation;Self-care/ADL training    OT Goals(Current goals can be found in the care plan section) Acute Rehab OT Goals Patient Stated Goal: pt thinks she will go back to using hospice services OT Goal Formulation: With patient Time For Goal Achievement: 08/16/19 Potential to Achieve Goals: Good ADL Goals Additional ADL Goal #1: Pt will state at least 3 energy conservation strategies as instructed and demonstrate pursed lip breathing during exertion. Additional ADL Goal #2: Pt will perform ADL modified independently initiating rest breaks as  appropriate.  OT Frequency: Min 2X/week   Barriers to D/C:            Co-evaluation              AM-PAC OT "6 Clicks" Daily Activity     Outcome Measure Help from another person eating meals?: None Help from another person taking care of personal grooming?: A Little Help from another person toileting, which includes using toliet, bedpan, or urinal?: A Little Help from another person bathing (including washing, rinsing, drying)?: A Little Help from another person to put on and taking off regular upper body clothing?: None Help from another person to put on and taking off regular lower body clothing?: A Little 6 Click Score: 20   End of Session Equipment Utilized During Treatment: Oxygen;Gait belt(4L) Nurse Communication: Other (comment)(wants to be awakened at 4:30 for a tv show)  Activity Tolerance: Patient tolerated treatment well Patient left: in bed;with call bell/phone within reach  OT Visit Diagnosis: Other (comment);Unsteadiness on feet (R26.81)(decreased activity tolerance)                Time: 0349-1791 OT Time Calculation (min): 20 min Charges:  OT General Charges $OT Visit: 1 Visit OT Evaluation $OT Eval Moderate Complexity: 1 Mod  Nestor Lewandowsky, OTR/L Acute Rehabilitation Services Pager: [phone] Office: [phone]  Malka So 08/02/2019, 3:03 PM

## 2019-08-02 NOTE — Progress Notes (Signed)
OT Cancellation Note  Patient Details Name: BILLI BRIGHT MRN: [id number] DOB: 09-May-1956   Cancelled Treatment:    Reason Eval/Treat Not Completed: Patient at procedure or test/ unavailable. Will follow.  Malka So 08/02/2019, 10:59 AM  Nestor Lewandowsky, OTR/L Acute Rehabilitation Services Pager: [phone] Office: [phone]

## 2019-08-03 DIAGNOSIS — G894 Chronic pain syndrome: Secondary | ICD-10-CM

## 2019-08-03 DIAGNOSIS — Z7189 Other specified counseling: Secondary | ICD-10-CM

## 2019-08-03 DIAGNOSIS — J449 Chronic obstructive pulmonary disease, unspecified: Secondary | ICD-10-CM

## 2019-08-03 MED ORDER — MORPHINE SULFATE (PF) 2 MG/ML IV SOLN
2.0000 mg | INTRAVENOUS | Status: DC | PRN
  Administered 2019-08-04 (×3): 2 mg via INTRAVENOUS
  Filled 2019-08-03 (×4): qty 1

## 2019-08-03 NOTE — Progress Notes (Signed)
I responded to spiritual care consult for an AD. Pt was awake and talking. She mentioned that she and her sister [name] have discussed the decisions about her care. She said her sister has the AD at home and plans to drop off at bedside tonight. I will follow up tomorrow for Notary signing. Analys has concerns about follow up spiritual care when she gets discharged. She requested that spiritual care follow up at her home. I offered spiritual care with ministry of presence, words of comfort, and prayer.   Palliative care Chaplain  Resident Fidel Levy [phone]

## 2019-08-03 NOTE — Progress Notes (Signed)
PROGRESS NOTE    Emily Moran  WUJ:[id number] DOB: Feb 27, 1956 DOA: 07/31/2019  PCP: Nolene Ebbs, MD    LOS - 2   Brief Narrative:  Emily Moran is an 63 y.o. female with medical history significant ofend-stage COPD on 3 L oxygen at baselineand palliative care, HCV status post treatment, degenerative joint disease, bipolar disorder, anxiety, depression, chronic pain on methadone, hypertension, tobacco use admitted to the hospital on 10/19 for end-stage COPD and multifocal pneumonia. It was later thought that her presentation was more related to end-stage COPD than pneumonia. She was dischargedyesterday 10/22. Patient states she has continued to feel very tired and short of breath since she left the hospital. She could barely walk to her car. She also experiences pain on both sides of her chest when she feels short of breath. States she was previously using 3 L home oxygen but since leaving the hospital has increased it to 4 L.  CT scan of chest done and there is evidence of masslike consolidation causing compression of the left mainstem bronchus as well as left subsegmental pulmonary arterial branches.  Subjective 10/26: Patient seen and examined, awake sitting up in bed.  Is tearful during our talk this morning.  She reports progressively worsening dyspnea on exertion and generalized weakness in addition to 20 or more pounds weight loss in the past month to month and a half.  Discussed her long-term use of methadone, patient reports pain much to week to report to the clinic every single day and is requesting transition to alternative pain regimen.  Palliative following.  Clarified with patient her CODE STATUS is DNR, would not want to be intubated, but would accept BiPAP if in severe respiratory distress.  Patient currently denies fevers or chills, chest pain.  Is short of breath and on 4 L/min oxygen (baseline uses 3 L/min).  She denies other acute complaints.  Assessment & Plan:    Principal Problem:   SOB (shortness of breath) Active Problems:   TOBACCO ABUSE   HTN (hypertension)   Chronic pain syndrome   Advanced COPD (HCC)   Lung cancer (HCC)   Acute on chronic respiratory failure with hypoxia (HCC)   Lung mass   Palliative care encounter   Dyspnea Acute on Chronic hypoxic respiratory failure End-stage COPD New spiculated lung mass on imaging - baseline on 3 L/min -> now requiring 4 L/min - CT chest -  extensive findings concerning for malignancy and possible infection. There is also evidence of masslike consolidation causing compression of the left mainstem bronchus as well as left subsegmental pulmonary arterial branches. - CT abdomen pelvis obtained for staging - showed indeterminate 1.2 cm adrenal nodule, could reflect a benign (adrenal adenoma), but metastasis is not excluded. - Will discuss case first with pulmonary regarding feasibility of EBUS for tissue diagnosis.  I suspect this will be very high risk given her already tenuous respiratory status.  If too high risk, will see what IR may be able to offer. - palliative following - supplemental O2 - Prednisone 10 mg daily - cont - Nebs - cont Doxy course as prescribed on prior discharge.  Currently no sign of sepsis.  Leukocytosis likely 2/2 steroid use.  - COVID-19 NEGATIVE   Chronic pain syndrome -Continue home dose of methadone - Palliative working on transitioning pain regimen as patient unlikely to tolerate daily trips to methadone clinic due to her severe weakness which likely will progress.  Hypertension -BP lower end of normal -decrease BP medications  Anxiety -Reports Klonopin works well, started per palliative  Tobacco use -Continue NicoDerm patch which was started during her recent hospitalization.  Physical deconditioning -PT evaluation -OOB in chair for meals   DVT prophylaxis: Heparin   Code Status: DNR  Family Communication: None at bedside Disposition Plan:  Pending decision based on further work-up   Consultants:   Palliative  Procedures:   None  Antimicrobials:   None   Objective: Vitals:   08/02/19 0533 08/02/19 1510 08/02/19 2157 08/03/19 0427  BP: 98/70 135/85 (!) 147/82 113/69  Pulse: 86 85 73 66  Resp: 16 (!) 22 18 16   Temp: 98.8 F (37.1 C) 97.8 F (36.6 C) 98.2 F (36.8 C) 98.1 F (36.7 C)  TempSrc: Oral Oral Oral Oral  SpO2: 93% (!) 89% 90% 91%  Weight:      Height:        Intake/Output Summary (Last 24 hours) at 08/03/2019 4235 Last data filed at 08/02/2019 1854 Gross per 24 hour  Intake 220 ml  Output 500 ml  Net -280 ml   Filed Weights   08/01/19 1305  Weight: 44.3 kg    Examination:  General exam: awake, alert, no acute distress, frail, underweight, chronically ill-appearing Respiratory system: Very poor air movement, nearly absent breath sounds left base, no audible wheezes, rales or rhonchi, increased respiratory effort but no use of accessory muscles. Cardiovascular system: normal S1/S2, RRR, no JVD, murmurs, rubs, gallops, no pedal edema.   Gastrointestinal system: soft, non-tender, non-distended abdomen Central nervous system: alert and oriented x4. no gross focal neurologic deficits, normal speech Extremities: moves all, normal tone Skin: dry, intact, normal temperature Psychiatry: normal mood, congruent affect, judgement and insight appear normal    Data Reviewed: I have personally reviewed following labs and imaging studies  CBC: Recent Labs  Lab 07/27/19 1230 07/28/19 0239 07/31/19 1058  WBC 14.3* 13.9* 16.4*  NEUTROABS  --  10.8*  --   HGB 11.5* 10.7* 11.4*  HCT 39.3 36.1 38.1  MCV 96.6 95.0 95.5  PLT 411* 325 361   Basic Metabolic Panel: Recent Labs  Lab 07/27/19 1230 07/27/19 1922 07/28/19 0239 07/29/19 0808 07/30/19 0135 07/31/19 1058  NA 136  --  140 141 140 140  K 3.0* 2.8* 2.6* 3.3* 3.4* 4.1  CL 94*  --  101 103 103 102  CO2 29  --  29 26 27 26   GLUCOSE  104*  --  75 111* 82 161*  BUN 8  --  9 8 9 20   CREATININE 0.87  --  0.94 0.84 0.77 0.93  CALCIUM 8.8*  --  8.3* 8.6* 8.7* 9.3  MG  --  2.2  --  1.7 1.9  --   PHOS  --   --   --   --  3.5  --    GFR: Estimated Creatinine Clearance: 44.4 mL/min (by C-G formula based on SCr of 0.93 mg/dL). Liver Function Tests: Recent Labs  Lab 07/29/19 0808 07/30/19 0135  AST 14*  --   ALT 9  --   ALKPHOS 88  --   BILITOT 0.3  --   PROT 6.5  --   ALBUMIN 2.4* 2.3*   No results for input(s): LIPASE, AMYLASE in the last 168 hours. No results for input(s): AMMONIA in the last 168 hours. Coagulation Profile: No results for input(s): INR, PROTIME in the last 168 hours. Cardiac Enzymes: No results for input(s): CKTOTAL, CKMB, CKMBINDEX, TROPONINI in the last 168 hours. BNP (last 3  results) No results for input(s): PROBNP in the last 8760 hours. HbA1C: No results for input(s): HGBA1C in the last 72 hours. CBG: No results for input(s): GLUCAP in the last 168 hours. Lipid Profile: No results for input(s): CHOL, HDL, LDLCALC, TRIG, CHOLHDL, LDLDIRECT in the last 72 hours. Thyroid Function Tests: No results for input(s): TSH, T4TOTAL, FREET4, T3FREE, THYROIDAB in the last 72 hours. Anemia Panel: No results for input(s): VITAMINB12, FOLATE, FERRITIN, TIBC, IRON, RETICCTPCT in the last 72 hours. Sepsis Labs: Recent Labs  Lab 07/31/19 0000  PROCALCITON 0.36    Recent Results (from the past 240 hour(s))  SARS CORONAVIRUS 2 (TAT 6-24 HRS) Nasopharyngeal Nasopharyngeal Swab     Status: None   Collection Time: 07/27/19  1:22 PM   Specimen: Nasopharyngeal Swab  Result Value Ref Range Status   SARS Coronavirus 2 NEGATIVE NEGATIVE Final    Comment: (NOTE) SARS-CoV-2 target nucleic acids are NOT DETECTED. The SARS-CoV-2 RNA is generally detectable in upper and lower respiratory specimens during the acute phase of infection. Negative results do not preclude SARS-CoV-2 infection, do not rule out  co-infections with other pathogens, and should not be used as the sole basis for treatment or other patient management decisions. Negative results must be combined with clinical observations, patient history, and epidemiological information. The expected result is Negative. Fact Sheet for Patients: SugarRoll.be Fact Sheet for Healthcare Providers: https://www.woods-mathews.com/ This test is not yet approved or cleared by the [place] FDA and  has been authorized for detection and/or diagnosis of SARS-CoV-2 by FDA under an Emergency Use Authorization (EUA). This EUA will remain  in effect (meaning this test can be used) for the duration of the COVID-19 declaration under Section 56 4(b)(1) of the Act, 21 U.S.C. section 360bbb-3(b)(1), unless the authorization is terminated or revoked sooner. Performed at [facility], [place] [address] [place], [place] [address]   Blood culture (routine x 2)     Status: None   Collection Time: 07/27/19  3:55 PM   Specimen: BLOOD  Result Value Ref Range Status   Specimen Description BLOOD RIGHT UPPER ARM  Final   Special Requests   Final    BOTTLES DRAWN AEROBIC ONLY Blood Culture results may not be optimal due to an inadequate volume of blood received in culture bottles   Culture   Final    NO GROWTH 5 DAYS Performed at [facility], [place] [address] [place], [place] [address]    Report Status 08/01/2019 FINAL  Final  Blood culture (routine x 2)     Status: None   Collection Time: 07/27/19  4:21 PM   Specimen: BLOOD LEFT FOREARM  Result Value Ref Range Status   Specimen Description BLOOD LEFT FOREARM  Final   Special Requests   Final    BOTTLES DRAWN AEROBIC AND ANAEROBIC Blood Culture adequate volume   Culture   Final    NO GROWTH 5 DAYS Performed at [facility], [place] [address], [place], [place] [address]    Report Status 08/01/2019 FINAL  Final  SARS CORONAVIRUS 2 (TAT 6-24 HRS)  Nasopharyngeal Nasopharyngeal Swab     Status: None   Collection Time: 07/31/19  8:53 PM   Specimen: Nasopharyngeal Swab  Result Value Ref Range Status   SARS Coronavirus 2 NEGATIVE NEGATIVE Final    Comment: (NOTE) SARS-CoV-2 target nucleic acids are NOT DETECTED. The SARS-CoV-2 RNA is generally detectable in upper and lower respiratory specimens during the acute phase of infection.  Negative results do not preclude SARS-CoV-2 infection, do not rule out co-infections with other pathogens, and should not be used as the sole basis for treatment or other patient management decisions. Negative results must be combined with clinical observations, patient history, and epidemiological information. The expected result is Negative. Fact Sheet for Patients: SugarRoll.be Fact Sheet for Healthcare Providers: https://www.woods-mathews.com/ This test is not yet approved or cleared by the [place] FDA and  has been authorized for detection and/or diagnosis of SARS-CoV-2 by FDA under an Emergency Use Authorization (EUA). This EUA will remain  in effect (meaning this test can be used) for the duration of the COVID-19 declaration under Section 56 4(b)(1) of the Act, 21 U.S.C. section 360bbb-3(b)(1), unless the authorization is terminated or revoked sooner. Performed at [facility], [place] [address], [place], [place] [address]          Radiology Studies: Ct Abdomen Pelvis W Contrast  Result Date: 08/02/2019 CLINICAL DATA:  Recent diagnosis of lung cancer, shortness of breath, end-stage COPD, recent weight loss, no abdominal complaints EXAM: CT ABDOMEN AND PELVIS WITH CONTRAST TECHNIQUE: Multidetector CT imaging of the abdomen and pelvis was performed using the standard protocol following bolus administration of intravenous contrast. CONTRAST:  164mL OMNIPAQUE IOHEXOL 300 MG/ML  SOLN COMPARISON:  CT chest 07/31/2019, CT abdomen pelvis 09/07/2005  FINDINGS: Lower chest: Redemonstrated emphysematous changes in the lungs with additional areas honeycombing and reticulonodular opacity in right lung base. More masslike consolidation is seen along the left lung base centrally which appears to extend from the hilar mass on comparison exam. There is increasing volume loss in the left lung base likely secondary to the occlusion of the left mainstem bronchus seen on the comparison. Mild cardiomegaly. No pericardial effusion. Atherosclerotic plaque within the normal caliber aorta. Hepatobiliary: Focal fatty infiltration is noted along the falciform ligament. There is mild intra and extrahepatic biliary ductal dilatation without obstructing gallstone visualized. No calcified gallstones within the gallbladder lumen. No pericholecystic inflammation. Pancreas: Unremarkable. No pancreatic ductal dilatation or surrounding inflammatory changes. Spleen: Normal in size without focal abnormality. Adrenals/Urinary Tract: Indeterminate 1.2 cm nodule in the left adrenal gland. No suspicious right adrenal lesions. Bilateral areas of cortical scarring. Multiple subcentimeter hypoattenuating foci in both kidneys are too small to fully characterize on CT imaging but statistically likely benign. To characterize. Larger fluid attenuation cyst is seen in the upper pole left kidney measuring up to 2.4 cm in size. A 'brush-like' appearance of increased attenuation within renal medullary regions suggests underlying renal medullary process such as sponge kidney or nephrocalcinosis. Mild bilateral symmetric perinephric stranding, a nonspecific finding though may correlate with either age or decreased renal function. No urolithiasis or hydronephrosis. Urinary bladder is unremarkable. Stomach/Bowel: Distal esophagus, stomach and duodenal sweep are unremarkable. No small bowel wall thickening or dilatation. No evidence of obstruction. The appendix is not visualized. No pericecal inflammation is  evident however. No colonic dilatation or wall thickening. Vascular/Lymphatic: Extensive atherosclerotic calcification of the aorta is noted. Multilevel ostial narrowing is incompletely assessed on this non angiographic CT. No suspicious or enlarged lymph nodes in the included lymphatic chains. Mid mesenteric congestion is seen. Reproductive: Uterus appears surgically absent. No concerning adnexal lesions. Other: Paucity of subcutaneous and intraperitoneal fat. Mild hazy increased attenuation of the mid mesentery possibly congestion. No free air. Pelvic floor laxity. Musculoskeletal: Multilevel degenerative changes are present in the imaged portions of the spine. Findings most severe at L4-5 with right lateral listhesis of approximately 1 cm. No  suspicious osseous lesions. Right femoral intramedullary nail and transcervical fixation screw are noted. IMPRESSION: 1. Indeterminate 1.2 cm adrenal nodule, while this could reflect a benign etiologies such as an adrenal adenoma, in the setting of malignancy in the chest, metastasis is not excluded. If further evaluation is clinically warranted, multiphase adrenal CT could be obtained. 2. More masslike consolidation along the left lung base centrally which appears to extend from the hilar mass on comparison exam. There is increasing volume loss in the left lung base likely secondary to the occlusion of the left mainstem bronchus seen on the comparison exam. 3. Mild intra and extrahepatic biliary ductal dilatation without obstructing gallstone visualized. Recommend further evaluation with right upper quadrant ultrasound as clinically indicated. 4. Paucity of intraperitoneal subcutaneous fat, correlate with signs and symptoms of cachexia. 5. Multiple renal cysts. Hazy stranding of both kidneys, nonspecific but can be seen in the setting of diminished renal function. 6. Question some focally increased attenuation of the central renal medullary regions possibly medullary sponge  kidney or early nephrocalcinosis. 7. Aortic Atherosclerosis (ICD10-I70.0). The Preliminary findings were discussed via telephone at the time of interpretation on 08/02/2019 at 3:45 p.m. to provider [facility], who verbally acknowledged these results. Electronically Signed   By: Lovena Le M.D.   On: 08/02/2019 16:14        Scheduled Meds: . budesonide  0.5 mg Nebulization BID  . doxycycline  100 mg Oral Q12H  . feeding supplement (ENSURE ENLIVE)  237 mL Oral TID BM  . gabapentin  800 mg Oral QID  . heparin  5,000 Units Subcutaneous Q8H  . methadone  80 mg Oral Daily  . nicotine  14 mg Transdermal Daily  . predniSONE  10 mg Oral Q breakfast  . sertraline  100 mg Oral Daily   Continuous Infusions:   LOS: 2 days    Time spent: 35-40 min    Ezekiel Slocumb, DO [facility] Pager: [phone]  If 7PM-7AM, please contact night-coverage [email or web address] Password [email or web address] 08/03/2019, 8:12 AM

## 2019-08-03 NOTE — Progress Notes (Signed)
For documentation of HCPOA and patient wishes:  Discussed with patient this morning regarding surrogate medical decision maker.  She states that her daughter, Joellen Jersey, who patient lives with should make any decisions for her, in the event patient is not capable.  States that Joellen Jersey knows what her wishes would be, and could be trusted to make decisions based on what she, the patient, would want.  States that her sister, [name], is not to be her surrogate for these purposes, despite the fact that [name] thinks she should be.    Patient confirms that she is DNR, would not want intubation, but if in respiratory distress would accept use of bipap.  Patient also states that she is too weak to be safe at home, that her daughter works long hours and cannot be there all the time.  Patient states she is at the point that she will require assistance at all times because she is extremely weak and she realizes this will progress further.    Also expresses great concern over pain control.  Due to her decline and poor functional status, will not be able to manage going to methadone clinic every day.  Will discuss transition to another regimen with palliative.   Signed  Nicole Kindred, DO [facility] Pager [phone]

## 2019-08-03 NOTE — Progress Notes (Signed)
Daily Progress Note   Patient Name: Emily Moran       Date: 08/03/2019 DOB: [birth year]  Age: 63 y.o. MRN#: [id number] Attending Physician: Ezekiel Slocumb, DO Primary Care Physician: Nolene Ebbs, MD Admit Date: 07/31/2019  Reason for Consultation/Follow-up: Establishing goals of care  Subjective: Patient in bed on the phone with family. She ended call during my presence, stating it was her sister. I introduced myself, and she is aware that I will be following her this week in the presences of my colleagues time off. She verbalized understanding and appreciation of Palliative's support. Emily Moran denies shortness of breath. States she is "really weak and really tired!" She was able to appropriately summarize her discussion with Haynes Dage, [place] (Palliative) on yesterday.   Emily Moran verbalizes awareness of the severity of her likely cancer diagnosis in both of her lungs. She states "I am just tired and weak. I know it isn't going to be cured, but I think I would feel better just hearing from a cancer specialist to tell me what it looks like, possibly what stage maybe, and the what if's if there would be any potential treatment options" Therapeutic listening and support given. Emily Moran states she wanted to discuss with her daughter Joellen Jersey about proceeding with biopsy or work-up however they both agreed that she needed more answers and would like to hear from Oncology before making a definitive decision. She states she feels she most likely would not want to undergo any treatments that could potentially make her feel worst but she may be open to treatment options if it brings her comfort. She again makes mention that she knows it is probably not curable since it is in more than one place. Support given.    Emily Moran verbalizes awareness of palliative versus hospice and is requesting continued support as she navigates through her decisions.   She also mentions that she wanted to have advanced directives completed. She reports she originally was going to consider her daughter Joellen Jersey as her HCPOA however after further thoughts she would not want to do this to her as she is young and has an active 49 year old daughter and busy life. She doesn't want to place the burden on her she states. She shares that she lived with Joellen Jersey prior to this admission but now thinks she  will go and live with her sister [name] because she lives alone and is home more and able to provide more time and care for her. Emily Moran states "I think I am going to ask [name] to be my official HCPOA, but I want to tell Joellen Jersey first so that she doesn't get upset with me or find out afterwards!" I shared with Kallie she has a right to choose whomever to be her HCPOA, she also can choose a secondary person, but the main goal is for her to choose the person she is most comfortable choosing and with awareness that they will support her wishes and advocate for her. She verbalized understanding and appreciation. She is requesting to complete documents tomorrow allowing her time to finalize her wishes. I also made her aware that if the document is not here (as she mentioned her sister had it) that we have additional copies and she is the only one that is required to be present for completion her daughter or sister does not have to be here. She verbalized understanding.   Emily Moran also states she would like to come off of her Methadone and be placed on other medications as she feels she would not be able (strength wise) to go back and forth to the clinic. She shares previously she was on a fentanyl patch with Roxicodone for break through pain. She confirms she has been on Methadone for more than 5 years. I educated Ms. Nham that although this is her request it is not an  easy process to discontinue Methadone. Methadone can not be abruptly stopped, this would require significant thought into tapering and given her long-term use this would be done over time (weeks), close medical monitoring, managing symptoms through the process, and also finding the right regimen to supplement her long-term use. She verbalized understanding.   Length of Stay: 2  Current Medications: Scheduled Meds:  . budesonide  0.5 mg Nebulization BID  . doxycycline  100 mg Oral Q12H  . feeding supplement (ENSURE ENLIVE)  237 mL Oral TID BM  . gabapentin  800 mg Oral QID  . heparin  5,000 Units Subcutaneous Q8H  . methadone  80 mg Oral Daily  . nicotine  14 mg Transdermal Daily  . predniSONE  10 mg Oral Q breakfast  . sertraline  100 mg Oral Daily    Continuous Infusions:   PRN Meds: acetaminophen **OR** acetaminophen, albuterol, clonazePAM, hydrALAZINE, hydrOXYzine, traZODone  Physical Exam -awake, NAD, thin, frail -RRR -diminished bilaterally -mood appropriate, weak    Vital Signs: BP 127/80   Pulse 70   Temp 98.2 F (36.8 C) (Oral)   Resp (!) 21   Ht 5\' 7"  (1.702 m)   Wt 44.3 kg   SpO2 98%   BMI 15.30 kg/m  SpO2: SpO2: 98 % O2 Device: O2 Device: Room Air O2 Flow Rate: O2 Flow Rate (L/min): 4 L/min  Intake/output summary:   Intake/Output Summary (Last 24 hours) at 08/03/2019 1553 Last data filed at 08/02/2019 1854 Gross per 24 hour  Intake -  Output 500 ml  Net -500 ml   LBM: Last BM Date: 07/31/19 Baseline Weight: Weight: 44.3 kg Most recent weight: Weight: 44.3 kg       Palliative Assessment/Data: 40%    Patient Active Problem List   Diagnosis Date Noted  . Acute on chronic respiratory failure with hypoxia ([place])   . Lung mass   . Palliative care encounter   . Lung cancer ([place]) 08/01/2019  .  SOB (shortness of breath) 07/31/2019  . Advanced COPD ([place]) 07/31/2019  . Multifocal pneumonia 07/27/2019  . Pneumonia 07/27/2019  . Grief 04/13/2019  .  Physical deconditioning 04/13/2019  . Protein-calorie malnutrition, severe 07/10/2017  . MRSA carrier 07/09/2017  . Anemia 07/08/2017  . Renal insufficiency 07/08/2017  . Ectatic aorta ([place]) 07/08/2017  . COPD exacerbation ([place]) 07/19/2016  . Lung nodule 07/19/2016  . Septic shock ([place]) 04/05/2016  . HCAP (healthcare-associated pneumonia) 04/05/2016  . CAP (community acquired pneumonia) 03/07/2016  . Acute respiratory failure with hypoxia ([place]) 03/16/2015  . COPD (chronic obstructive pulmonary disease) ([place]) 03/16/2015  . HTN (hypertension) 03/16/2015  . Leukocytosis 03/16/2015  . Hyperglycemia 03/16/2015  . Chronic pain syndrome 03/16/2015  . Hepatitis C 12/18/2007  . TOBACCO ABUSE 09/11/2007  . Substance abuse ([place]) 09/11/2007    Palliative Care Assessment & Plan   Recommendations/Plan:  DNR  Continue with current plan of care  Patient really wanting Oncology input to assist with her decision making regarding pursuing further work-up or not. Would consider Radiation/Oncology and/or Pulmonology consult.   Continue Methadone. Palliative inpatient would not be equipped to successfully wean patient off of her Methadone given her long-term use (over 5 years). This would require titration over weeks, close medical monitoring, management of any withdrawal symptoms, and finding the correct regimen for her. It would be most beneficial given her success on Methadone to secure an outpatient source that would be able to prescribe in the event she is truly unable to go to the clinic. Given she has most likely metastatic cancer [facility] or Oncology/Internal Medicine could potentially assist with this given reasoning for pain and acceptable considerations to the STOP ACT guidelines.   Patient now stating she is considering her sister as her 47. Requesting follow up for completion of AD tomorrow.   PMT will continue to support and follow.   Goals of Care and Additional  Recommendations:  Limitations on Scope of Treatment: Full Scope Treatment  Code Status:    Code Status Orders  (From admission, onward)         Start     Ordered   07/31/19 2045  Do not attempt resuscitation (DNR)  Continuous    Question Answer Comment  In the event of cardiac or respiratory ARREST Do not call a "code blue"   In the event of cardiac or respiratory ARREST Do not perform Intubation, CPR, defibrillation or ACLS   In the event of cardiac or respiratory ARREST Use medication by any route, position, wound care, and other measures to relive pain and suffering. May use oxygen, suction and manual treatment of airway obstruction as needed for comfort.      07/31/19 2046        Code Status History    Date Active Date Inactive Code Status Order ID Comments User Context   07/27/2019 1714 07/30/2019 1817 DNR [id number]  Karmen Bongo, MD ED   07/08/2017 0609 07/11/2017 1909 Full Code [id number]  Jani Gravel, MD ED   07/19/2016 1749 07/22/2016 1706 Full Code [id number]  Elgergawy, Silver Huguenin, MD Inpatient   04/05/2016 2257 04/08/2016 1253 Full Code [id number]  Ivor Costa, MD ED   03/07/2016 1743 03/09/2016 1359 Full Code [id number]  Thurnell Lose, MD ED   03/16/2015 1648 03/19/2015 1503 Full Code [id number]  Samella Parr, NP ED   05/07/2014 0139 05/07/2014 1916 Full Code [id number]  Oswald Hillock, MD Inpatient   12/30/2013 1849 01/03/2014 1340 Full Code [id number]  Rama,  Venetia Maxon, MD Inpatient   11/28/2012 1947 12/01/2012 1508 Full Code [id number]  Velvet Bathe, MD Inpatient   Advance Care Planning Activity      Prognosis:  Unable to determine Per Haynes Dage, [place]:  If she opts to avoid aggressive treatment it is reasonable to give a prognosis of less than 6 months given end stage COPD with back to back hospitalizations, rapid decline in function, increased oxygen requirements, 20 lg wt loss and probable advanced stage lung cancer.  Discharge Planning:  To Be Determined   Thank you for  allowing the Palliative Medicine Team to assist in the care of this patient.  Total Time:40 min.   Greater than 50%  of this time was spent counseling and coordinating care related to the above assessment and plan.  Alda Lea, AGPCNP-BC Palliative Medicine Team  Phone: [phone] Pager: [phone] Amion: Bjorn Pippin    Please contact Palliative Medicine Team phone at [phone] for questions and concerns.

## 2019-08-04 ENCOUNTER — Institutional Professional Consult (permissible substitution): Payer: Medicare Other | Admitting: Radiation Oncology

## 2019-08-04 DIAGNOSIS — R918 Other nonspecific abnormal finding of lung field: Secondary | ICD-10-CM

## 2019-08-04 DIAGNOSIS — J9621 Acute and chronic respiratory failure with hypoxia: Secondary | ICD-10-CM

## 2019-08-04 DIAGNOSIS — C3482 Malignant neoplasm of overlapping sites of left bronchus and lung: Secondary | ICD-10-CM

## 2019-08-04 MED ORDER — NAPHAZOLINE-GLYCERIN 0.012-0.2 % OP SOLN
1.0000 [drp] | Freq: Four times a day (QID) | OPHTHALMIC | Status: DC | PRN
  Filled 2019-08-04: qty 15

## 2019-08-04 MED ORDER — MORPHINE SULFATE (PF) 4 MG/ML IV SOLN
4.0000 mg | INTRAVENOUS | Status: DC | PRN
  Administered 2019-08-04 – 2019-08-05 (×2): 4 mg via INTRAVENOUS
  Filled 2019-08-04 (×2): qty 1

## 2019-08-04 NOTE — Consult Note (Addendum)
We were initially consulted by Dr. Arbutus Ped. Dr. Isidore Moos was contacted and passed along the request. I spent time reading the notes from the admitting and attending providers as well as the palliative care team as the patient was previously established with palliative care prior to coming to the hospital. I spoke with the inpatient palliative team, and after their discussion with the patient and daughter today,  the patient does not wish to pursue further work up of her lung mass or proceed with any therapy. She is going to be set up with [facility] to transition from palliative to Hospice based care. We will be happy to be available as needed if decision making were to change, but for now will not formally consult.       Carola Rhine, PAC

## 2019-08-04 NOTE — Progress Notes (Signed)
Occupational Therapy Treatment Patient Details Name: Emily Moran MRN: [id number] DOB: 11-Sep-1956 Today's Date: 08/04/2019    History of present illness 63 y.o. female admitted on 07/31/19 for increased SOB and progressive weakness after bing d/c from [facility] on 07/30/19.  CT scan this admission revealed new lung mass.  COVID (-) and pt active hospice patient.  Pt with other significant PMH of tobacco use, spinal stenosis, home O2, HTN, hepatitis C, DDD, COPD, anemia, R femur IM nail, back surgery, ACDF.     OT comments  Pt making steady progress towards OT goals this session. Session focus on functional mobility and toileting tasks with a focus on energy conservation. Pt on 5L O2 throughout session with pt de-sating to 90% from walking from EOB>recliner. Pt min guard overall for functional mobility with RW and supervision for all toileting tasks from [facility]. Continued reminders throughout for PLB. DC plan remains appropriate. Will continue to follow acutely per POC.    Follow Up Recommendations  Home health OT;Other (comment)(Return home with hospice care)    Equipment Recommendations  Tub/shower seat    Recommendations for Other Services      Precautions / Restrictions Precautions Precautions: Fall;Other (comment) Precaution Comments: monitor O2 sats Restrictions Weight Bearing Restrictions: No       Mobility Bed Mobility Overal bed mobility: Modified Independent             General bed mobility comments: HOB up slightly  Transfers Overall transfer level: Needs assistance Equipment used: Rolling walker (2 wheeled) Transfers: Sit to/from Stand Sit to Stand: Supervision         General transfer comment: stood impulsively from bed    Balance Overall balance assessment: Needs assistance Sitting-balance support: Feet supported;No upper extremity supported Sitting balance-Leahy Scale: Good     Standing balance support: During functional activity Standing balance-Leahy  Scale: Fair Standing balance comment: close supervision when standing for anterior pericare                           ADL either performed or assessed with clinical judgement   ADL Overall ADL's : Needs assistance/impaired                         Toilet Transfer: Min guard;Ambulation;RW;BSC Toilet Transfer Details (indicate cue type and reason): min guard with RW Toileting- Clothing Manipulation and Hygiene: Supervision/safety;Set up;Sit to/from stand       Functional mobility during ADLs: Min guard;Rolling walker General ADL Comments: min guard for funcitonal mobility with RW; supervision for toileting tasks; limited by decreased activity tolerance and generalized weakness     Vision Patient Visual Report: No change from baseline     Perception     Praxis      Cognition Arousal/Alertness: Awake/alert Behavior During Therapy: WFL for tasks assessed/performed Overall Cognitive Status: Within Functional Limits for tasks assessed                                 General Comments: pt slightly confused asking if the dinamap machine was underneath the bed        Exercises     Shoulder Instructions       General Comments pt on 5L O2 with sats 96% at start of session, dropping slightly to 90 from walking from EOB to recliner    Pertinent Vitals/ Pain  Pain Assessment: No/denies pain  Home Living                                          Prior Functioning/Environment              Frequency  Min 2X/week        Progress Toward Goals  OT Goals(current goals can now be found in the care plan section)  Progress towards OT goals: Progressing toward goals  Acute Rehab OT Goals Patient Stated Goal: pt thinks she will go back to using hospice services OT Goal Formulation: With patient Time For Goal Achievement: 08/16/19 Potential to Achieve Goals: Good  Plan Discharge plan remains appropriate     Co-evaluation                 AM-PAC OT "6 Clicks" Daily Activity     Outcome Measure   Help from another person eating meals?: None Help from another person taking care of personal grooming?: A Little Help from another person toileting, which includes using toliet, bedpan, or urinal?: A Little Help from another person bathing (including washing, rinsing, drying)?: A Little Help from another person to put on and taking off regular upper body clothing?: None Help from another person to put on and taking off regular lower body clothing?: A Little 6 Click Score: 20    End of Session Equipment Utilized During Treatment: Rolling walker;Oxygen;Other (comment)(5L O2)  OT Visit Diagnosis: Other (comment);Unsteadiness on feet (R26.81)   Activity Tolerance Patient tolerated treatment well   Patient Left in bed;with call bell/phone within reach;with bed alarm set   Nurse Communication Mobility status;Other (comment)(slightly de-sat with functional mobility)        Time: 2094-7096 OT Time Calculation (min): 26 min  Charges: OT General Charges $OT Visit: 1 Visit OT Treatments $Self Care/Home Management : 23-37 mins  [place], [place] [facility] [phone] [place] 08/04/2019, 3:31 PM

## 2019-08-04 NOTE — Progress Notes (Addendum)
I followed up with Pt for an AD. Pt was resting, but alert. She said that her sister did come last night to visit her, however she said she forgot to mention the AD document. I offered her the opportunity to fill another one out this morning, but she said that her sister is coming around noon today and requests a follow up around 2pm to get it notarized. I shared that we will do the best we can to accommodate her request. I asked her to have Nurse page Chaplain when they are ready for notary.  Chaplain Resident  Fidel Levy [phone]

## 2019-08-04 NOTE — TOC Initial Note (Addendum)
Transition of Care [facility]) - Initial/Assessment Note    Patient Details  Name: Emily Moran MRN: [id number] Date of Birth: 05-May-1956  Transition of Care [facility]) CM/SW Contact:    Alexander [place], [place] Phone Number: 08/04/2019, 2:52 PM  Clinical Narrative:                 CSW spoke with PMT provider and [place] provider. At this time pt has requested to go home with her sisters support and hospice. DME requested per PMT note- [facility] will f/u with pt sister Tye [place] at [phone] ext [phone]) and order DME. CSW also attempted to call pt sister, no answer, will follow for any additional needs. Pt provided her sisters address as [address], [place], [place]. This was passed to [name] with hospice. A hospital bed, shower head, and wheelchair was requested. [name] aware that pt has her oxygen through [employer] and [facility] will contact them about changing that service over. Confirmed with [name] that pt methadone can be chronically managed by [facility] as well.   Expected Discharge Plan: Home w Hospice Care Barriers to Discharge: Equipment Delay, Continued Medical Work up   Patient Goals and CMS Choice Patient states their goals for this hospitalization and ongoing recovery are:: discharge with hospice to sisters home CMS Medicare.gov Compare Post Acute Care list provided to:: Other (Comment Required)(n/a) Choice offered to / list presented to : Patient, Sibling  Expected Discharge Plan and Services Expected Discharge Plan: Home w Hospice Care In-house Referral: Clinical Social Work Discharge Planning Services: CM Consult Post Acute Care Choice: Resumption of Svcs/PTA Provider, Hospice, Durable Medical Equipment Living arrangements for the past 2 months: Single Family Home                 DME Arranged: Other see comment(per hospice) DME Agency: [facility]now [facility] Date DME Agency Contacted: 08/04/19 Time DME Agency  Contacted: [phone] Representative spoke with at DME Agency: Farrel Gordon HH Arranged: RN, Nurse's Aide, Disease Management [place] Agency: [facility] [place] Date [place]: 08/04/19 Time [place]: 1450 Representative spoke with at [place]: Farrel Gordon  Prior Living Arrangements/Services Living arrangements for the past 2 months: [place] Lives with:: Adult Children Patient language and need for interpreter reviewed:: Yes(no needs) Do you feel safe going back to the place where you live?: Yes      Need for Family Participation in Patient Care: Yes (Comment)(supervision; support) Care giver support system in place?: Yes (comment)(pt sister; adult daughter; [facility]) Current home services: DME, Hospice Criminal Activity/Legal Involvement Pertinent to Current Situation/Hospitalization: No - Comment as needed  Activities of Daily Living      Permission Sought/Granted Permission sought to share information with : Family Supports, Other (comment) Permission granted to share information with : Yes, Verbal Permission Granted  Share Information with NAME: Roque Cash  Permission granted to share info w AGENCY: [facility]  Permission granted to share info w Relationship: sister  Permission granted to share info w Contact Information: [phone] ext [phone]  Emotional Assessment Appearance:: Appears older than stated age Attitude/Demeanor/Rapport: Other (comment)(telephonic assessment) Affect (typically observed): Other (comment)(telephonic assessment) Orientation: : Oriented to Self, Oriented to Place, Oriented to  Time, Oriented to Situation Alcohol / Substance Use: Illicit Drugs(marijuana, chronic methadone use) Psych Involvement: Outpatient Provider  Admission diagnosis:  Lung mass [R91.8] Acute on chronic respiratory failure with hypoxia ([place]) [J96.21] Lung cancer ([place]) [C34.90] Patient  Active Problem List    Diagnosis Date Noted  . Acute on chronic respiratory failure with hypoxia ([place])   . Lung mass   . Palliative care encounter   . Lung cancer ([place]) 08/01/2019  . SOB (shortness of breath) 07/31/2019  . Advanced COPD ([place]) 07/31/2019  . Multifocal pneumonia 07/27/2019  . Pneumonia 07/27/2019  . Grief 04/13/2019  . Physical deconditioning 04/13/2019  . Protein-calorie malnutrition, severe 07/10/2017  . MRSA carrier 07/09/2017  . Anemia 07/08/2017  . Renal insufficiency 07/08/2017  . Ectatic aorta ([place]) 07/08/2017  . COPD exacerbation ([place]) 07/19/2016  . Lung nodule 07/19/2016  . Septic shock ([place]) 04/05/2016  . HCAP (healthcare-associated pneumonia) 04/05/2016  . CAP (community acquired pneumonia) 03/07/2016  . Acute respiratory failure with hypoxia ([place]) 03/16/2015  . COPD (chronic obstructive pulmonary disease) ([place]) 03/16/2015  . HTN (hypertension) 03/16/2015  . Leukocytosis 03/16/2015  . Hyperglycemia 03/16/2015  . Chronic pain syndrome 03/16/2015  . Hepatitis C 12/18/2007  . TOBACCO ABUSE 09/11/2007  . Substance abuse ([place]) 09/11/2007   PCP:  Nolene Ebbs, MD Pharmacy:   [id number] - [place], [place] [address] Phone: [phone] Fax: [place], [place] [address] Phone: [phone] Fax: [place], [place] [address] [place] [address] Phone: [phone] Fax: [phone]     Social Determinants of Health (SDOH) Interventions    Readmission Risk Interventions Readmission Risk Prevention Plan 08/04/2019  Transportation Screening Complete  PCP or Specialist Appt within 3-5 Days Not Complete  Not Complete comments plan for hospice  HRI or [place] Complete  Social Work Consult for [place] Planning/Counseling Complete  Palliative Care Screening Complete  Medication Review Press photographer) Complete  Some recent data might be hidden

## 2019-08-04 NOTE — Progress Notes (Signed)
PALLIATIVE NOTE:  Patient awake, A&O x3. She states pain is better now after receiving PRN medications earlier. Complains of some nausea, is aware that zofran is available however, she feels it is resolving now that she is lying on her side. She states she feels better today. Her sister, Emily Moran is at the bedside and they are paying bills together. Emily Moran provides permission to further discuss care with her sister.  Emily Moran again states she is going home with her sister now. She states she also has decided that her sister, Emily Moran is going to be assigned as her HCPOA. Emily Moran states she took POA paperwork home a few days ago and forgot to bring back. She is planning on going home after lunch to get the documents and plans to bring back so that Emily Moran can have them finalized. I educated Emily Moran and Emily Moran that we can provide an additional document if they would like, however Emily Moran stated the document has been completed other than being notarized and is ok with waiting on the copy they have at home. She plans to notify the nurse once she has the document in hand and ready for completion.   We briefly review goals of care. I educated Emily Moran on the risk and process of weaning her off of Methadone. I explained this is a tedious process and weaning her while hospitalized is not likely given the process happens over weeks. She verbalized understanding. Emily Moran states she is ok with staying on her Methadone with breakthrough medication if there is a provider that would prescribe without requiring her to go into the clinic, again referencing she is to tired and weak to continue making efforts to go to outpatient appointments.   Emily Moran shares that her son died in January 23, 2023 of this year. She is tearful and states she watched him suffer and thankfully he passed away rather comfortably once palliative was involved. Therapeutic listening and support provided.   Emily Moran states she has made up her mind and now has decided she is no longer  interested in work-up in regards to her cancer. She states she knows what this means and she is ok with knowing she will die soon. She states "I just want to be home with my family, comfortable, and not in pain" Support given. I explained possible evaluation and considerations from radiation oncology to see if palliative treatment can be offered and provided. Emily Moran verbalized understanding but declined. She stated she ws not interested because it is similar to going to the Methadone clinic. She does not want to come back and forth for treatments or appointments. She verbalizes her appreciation of the medical team's support but states "I am at peace with my decision." Support given. Emily Moran verbalized agreement and support of her sister's expressed wishes.   Emily Moran and Emily Moran asked appropriate questions regarding hospice in the home. I educated patient and sister on home hospice, their goals and philosophy. They verbalized understanding and appreciation. Patient states she will need home equipment such as a hospital bed, wheelchair, and oxygen. She also was asking about a handheld shower head. I explained I was not sure if the shower head could be provided but will include in the referral for consideration. Patient and sister verbalized understanding. I educated them on the referral process also. Emily Moran requested that Emily Moran be the contact person and had me to add her name under her contacts and also her address which is where she will be discharged to and need equipment delivered and set-up.   All  questions answered.   Assesment -awake, NAD, thin, frail -mood appropriate, A&O x3  Plan -DNR -Continue with current plan of care per medical team -Patient expressed wishes to discharge home with her sister, Emily Moran and hospice support for EOL/Comfort.  -Case Management referral for outpatient hospice (referral placed). She will need home oxygen, hospital bed, and wheelchair.  -Patient also to continue on home  Methadone and breakthrough pain medication. She will need hospice to manage her Methadone at discharge.  -PMT will continue to support and follow.   Total Time: 45 min.   Greater than 50%  of this time was spent counseling and coordinating care related to the above assessment and plan.  Alda Lea, AGPCNP-BC Palliative Medicine Team  Phone: [phone] Pager: [phone] Amion: Bjorn Pippin

## 2019-08-04 NOTE — Progress Notes (Signed)
[facility])  Hospital Liaison: RN note    Notified by [facility] [place] of patient/family request for [facility] services at home after discharge. Chart and patient information under reviewed by [facility] physician. Hospice eligibility confirmed.     Writer  left VM messages with sister Tye Maryland for return call to explain services.  Hospital liaison will attempt to contact family again on 08/05/2019.    Please send signed and completed DNR form home with patient/family. Patient will need prescriptions for discharge comfort medications.     DME needs per MD notes, patient currently has the following equipment in the home: oxygen .  Patient/family requests the following DME for delivery to the home: hospital bed, W/C and handheld shower. [place] equipment manager has been notified and will contact [employer] to arrange delivery to the home. Home address has been verified as  [address], [place], [place].  Tye Maryland is the family member to contact to arrange time of delivery.     [facility] aware of the above. Please notify [facility] when patient is ready to leave the unit at discharge. (Call [phone] or [phone] after 5pm.) [facility] information and contact numbers given to   .      Please call with any hospice related questions.     Thank you for this referral.     Farrel Gordon, RN, CCM  [place] (listed on [facility] under [facility] [place])  [phone]

## 2019-08-04 NOTE — Progress Notes (Signed)
PT Cancellation Note  Patient Details Name: Emily Moran MRN: [id number] DOB: September 27, 1956   Cancelled Treatment:    Reason Eval/Treat Not Completed: Patient declined, no reason specified Patient declined participating in therapy at this time due to headache. Pt requested PT come back later today or tomorrow. PT will continue to follow acutely.    Earney Navy, PTA Acute Rehabilitation Services Pager: [phone] Office: [phone]   08/04/2019, 10:41 AM

## 2019-08-04 NOTE — Consult Note (Addendum)
NAME:  Emily Moran, MRN:  [id number], DOB:  Feb 10, 1956, LOS: 3 ADMISSION DATE:  07/31/2019, CONSULTATION DATE:  08/04/2019 REFERRING AG:TXMIW [place] [facility]  , CHIEF COMPLAINT: New spiculated lung mass on CT imaging,Acute on chronic hypoxic respiratory failure,End Stage COPD   Brief History   63 year old female former smoker ( 42 pack year smoking history quit 07/2019) with end stage COPD , on 3 L [place] home oxygen, DNR, with recent admission 10/19-10/22, and readmission 10/23 for worsening dyspnea with increased home oxygen needs and  subsequent discovery of large pulmonary mass very suspicious for pulmonary carcinoma. PCCM have been consulted to evaluate for appropriateness of  possible biopsy/ interventions vs comfort care .  History of present illness   63 y.o.female former smoker ( Quit 07/09/2019 with a 47 pack year smoking history) with medical history significant ofend-stage COPD on 3 L oxygen at baselineand palliative care, HCV status post treatment, degenerative joint disease, bipolar disorder, anxiety, depression, chronic pain on methadone, hypertension, tobacco use admitted to the hospital  10/19 for end-stage COPD and multifocal pneumonia. Per noted the admission was more related to her end stage COPD than the pneumonia. She was discharged 10/22 and readmitted 10/23 with fatigue and weakness, bilateral chest pain, and worsening shortness of breath. CT Chest done 10/22 showed evidence New large soft tissue masslike consolidation within the left lower lobe and lingula that likely represents pulmonary neoplasm causing compression of the left mainstem bronchus as well as left subsegmental pulmonary arterial branches. Pt has had generalized weakness and unintentional weight loss of > 20 pounds in the last month to month and a half. But more over the last 4 months when she looks back.The patient understands this is most likely cancer. She is a DNR, and she realizes that if that is the case, it most  likely cannot be cured. She would like to talk with a cancer specialist to determine if there are options for treatment  that will bring her comfort. She does not want to pursue options that could potentially make her feel worse. PCCM have been asked to consult regarding new lung mass and appropriateness of biopsy/ intervention  vs comfort care. Pt wants to know her options before making any definitive decisions.  Past Medical History    Past Medical History:  Diagnosis Date  . Anemia    "as a child"  . Anxiety   . Arthritis    "shoulders; back" (03/16/2015)  . Chronic back pain   . Chronic bronchitis ([place])    "I basically keep it" (03/16/2015)  . Chronic pain   . COPD (chronic obstructive pulmonary disease) ([place])   . DDD (degenerative disc disease), lumbar   . Degenerative disc disease   . Depression   . Ectatic aorta ([place]) 07/08/2017  . Family history of adverse reaction to anesthesia    "my mother had an allergic reaction when she had kidney removed back in the '60's or '70's""  . Headache    "monthly; need glasses; comes on when I'm stressed or get too tired" (03/16/2015)  . Hepatitis C   . History of stomach ulcers   . Hypertension   . Kidney stone   . MRSA carrier 07/09/2017  . On home O2    "~ 3L once/wk and prn" (03/16/2015)  . Pain management   . Pneumonia    "@ least once/yr for the past 8 yrs" (03/16/2015)  . Spinal stenosis   . Tobacco use     Significant Hospital  Events   Admission 10/19-10/22>> COPD exacerbation/ pneumonia Re-admission 10/23>> New spiculated mass per LLL which extends into left hilum and mediastinum and causes compression of the left mainstem bronchus as well as left subsegmental pulmonary arterial branches  Consults:  Palliative Care 10/23 PCCM  Procedures:    Significant Diagnostic Tests:  07/31/2019 CT Chest with Contrast New large soft tissue masslike consolidation within the left lower lobe and lingula that likely represents pulmonary  neoplasm, with possible superimposed infectious etiology. 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 The masslike consolidation extends into the left hilum and mediastinum and causes compression of the left mainstem bronchus as well as left subsegmental pulmonary arterial branches. Subcarinal and prevascular adenopathy. Ill-defined ground-glass opacities with adjacent reticulonodular opacities in the right lower lung and right middle lobe which could represent lymphangitic carcinomatosis/inflammatory changes. Ill-defined spiculated nodule measuring 3.5 x 1.9 cm along the right major fissure which has slightly increased in size from the prior exam, and could also represent a pulmonary neoplasm versus worsening scarring. Extensive centrilobular emphysematous changes. Aortic Atherosclerosis (ICD10-I70.0).  07/31/2019:CT Abdomen Pelvis>> for staging showed indeterminate 1.2 cm adrenal nodule, could reflect a benign (adrenal adenoma), but metastasis is not excluded Micro Data:  10/23>> SARS negative 10/19>> Blood No Growth  Antimicrobials:  Doxycycline 10/23>>  Interim history/subjective:  Pt. States she is tired and weak.  She states she knows this suspected cancer will not be cured, but that she would feel better speaking with a cancer specialist  To determine stage and treatment options that would bring her comfort. She states her baseline weight when she is healthy  is 125 lbs. Weight documented in Epic is 97 pounds She has been aware of worsening unintentional weight loss over the last 4 months.   Objective   Blood pressure 139/81, pulse 90, temperature 98.4 F (36.9 C), temperature source Oral, resp. rate 16, height 5\' 7"  (1.702 m), weight 44.3 kg, SpO2 92 %.       No intake or output data in the 24 hours ending 08/04/19 1119 Filed Weights    08/01/19 1305  Weight: 44.3 kg    Examination: General: Cachectic female sitting on side of bed, wearing oxygen, In NAD at rest  HENT: NCAT, No LAD. MM dry Lungs: Bilateral chest excursion, no nasal flaring or sternal retractions at rest, diminished air movement, minimal breath sounds L base, no wheezes, rales or rhonchi Cardiovascular: S1, S2, RRR no RMG Abdomen: Thin, non-distended, non tender abdomen, Body mass index is 15.3 kg/m. Extremities: Thin, no obvious deformities, normal tone, normal movement Neuro: Awake and alert and appropriate, MAE x 4, affect is flat, but appropriate GU: NA  Resolved Hospital Problem list     Assessment & Plan:  New large spiculated mass on CT imaging suspicious for malignancy Mass like consolidation causing compression of the left mainstem bronchus as well as left subsegmental pulmonary arterial branches. No tissue diagnosis DNR Plan Severe Emphysema / COPD eliminates options of procedures requiring sedation / general anesthesia Consider palliative radiation to L mainstem bronchus if radiation oncology will consider without tissue diagnosis Continued support from Palliative care and Hospice  Acute on Chronic Respiratory failure End Stage COPD DNR Increased WOB with minimal exertion Plan Titrate oxygen for sat goals of 88-92% Continue Doxy per Primary Team Continue prednisone per primary team  Continue Pulmicort BID and albuterol nebs prn OOB in chair as able Pulmonary Toilet   I spoke  at length with Shirlene. We discussed that her underlying pulmonary issues ( end stage COPD and emphysema) make her a poor candidate for any interventions that would require sedation or general anesthesia. We discussed what her most important priority is at this point. She states  is focused on comfort and making sure she does not traumatize her daughter and grandchildren as she becomes progressively worse. She has had discussions with family and she feels they  agree with her decision to enjoy the time she has left without being sick, losing her hair, or being in pain.She feels hospice will be her best option at this point. She wants to make her sister her 34. She does not want to burden her daughter with that responsibility.She states  does not feel she needs a confirmatory tissue sampling diagnosis, especially if it could cause her pain, or compromise her breathing. We did briefly discuss the option of palliative radiation if she is a candidate.     Best practice:  Diet: Per primary team Pain/Anxiety/Delirium protocol (if indicated): Per Primary team VAP protocol (if indicated): NA DVT prophylaxis: heparin SQ GI prophylaxis: none Glucose control: Serum Glucose Mobility: OOB to chair/ BR Code Status: DNR Family Communication: No family at bedside Disposition: Floor  Labs   CBC: Recent Labs  Lab 07/31/19 1058  WBC 16.4*  HGB 11.4*  HCT 38.1  MCV 95.5  PLT 568    Basic Metabolic Panel: Recent Labs  Lab 07/29/19 0808 07/30/19 0135 07/31/19 1058  NA 141 140 140  K 3.3* 3.4* 4.1  CL 103 103 102  CO2 26 27 26   GLUCOSE 111* 82 161*  BUN 8 9 20   CREATININE 0.84 0.77 0.93  CALCIUM 8.6* 8.7* 9.3  MG 1.7 1.9  --   PHOS  --  3.5  --    GFR: Estimated Creatinine Clearance: 44.4 mL/min (by C-G formula based on SCr of 0.93 mg/dL). Recent Labs  Lab 07/31/19 0000 07/31/19 1058  PROCALCITON 0.36  --   WBC  --  16.4*    Liver Function Tests: Recent Labs  Lab 07/29/19 0808 07/30/19 0135  AST 14*  --   ALT 9  --   ALKPHOS 88  --   BILITOT 0.3  --   PROT 6.5  --   ALBUMIN 2.4* 2.3*   No results for input(s): LIPASE, AMYLASE in the last 168 hours. No results for input(s): AMMONIA in the last 168 hours.  ABG    Component Value Date/Time   PHART 7.449 (H) 08/27/2007 1115   PCO2ART 37.4 08/27/2007 1115   PO2ART 43.5 (L) 08/27/2007 1115   HCO3 25.5 (H) 08/27/2007 1115   TCO2 27 01/25/2017 0854   ACIDBASEDEF 6.4 (H)  08/22/2007 1440   O2SAT 75.9 08/27/2007 1115     Coagulation Profile: No results for input(s): INR, PROTIME in the last 168 hours.  Cardiac Enzymes: No results for input(s): CKTOTAL, CKMB, CKMBINDEX, TROPONINI in the last 168 hours.  HbA1C: Hgb A1c MFr Bld  Date/Time Value Ref Range Status  03/16/2015 07:26 AM 5.3 4.8 - 5.6 % Final    Comment:    (NOTE)         Pre-diabetes: 5.7 - 6.4         Diabetes: >6.4         Glycemic control for adults with diabetes: <7.0     CBG: No results for input(s): GLUCAP in the last 168 hours.  Review of Systems:   Gen: Denies fever, chills, +  weight change, + fatigue,No  night sweats HEENT: Denies blurred vision, double vision, hearing loss, tinnitus, sinus congestion, rhinorrhea, sore throat, neck stiffness, dysphagia PULM: + shortness of breath with minimal exertion, baseline cough, baseline sputum production, No hemoptysis, occasional wheezing CV: Denies chest pain, edema, orthopnea, paroxysmal nocturnal dyspnea, palpitations GI: Denies abdominal pain, nausea, vomiting, diarrhea, hematochezia, melena, constipation, change in bowel habits, + poor appetite GU: Denies dysuria, hematuria, polyuria, oliguria, urethral discharge Endocrine: Denies hot or cold intolerance, polyuria, polyphagia or appetite change Derm: Denies rash, dry skin, scaling or peeling skin change Heme: Denies easy bruising, bleeding, bleeding gums Neuro: Denies headache, numbness, weakness, slurred speech, loss of memory or consciousness  Past Medical History  She,  has a past medical history of Anemia, Anxiety, Arthritis, Chronic back pain, Chronic bronchitis (HCC), Chronic pain, COPD (chronic obstructive pulmonary disease) ([place]), DDD (degenerative disc disease), lumbar, Degenerative disc disease, Depression, Ectatic aorta (HCC) (07/08/2017), Family history of adverse reaction to anesthesia, Headache, Hepatitis C, History of stomach ulcers, Hypertension, Kidney stone, MRSA  carrier (07/09/2017), On home O2, Pain management, Pneumonia, Spinal stenosis, and Tobacco use.   Surgical History    Past Surgical History:  Procedure Laterality Date  . ANTERIOR CERVICAL DECOMP/DISCECTOMY FUSION  2002  . BACK SURGERY    . FEMUR IM NAIL Right 11/28/2012   Procedure: INTRAMEDULLARY (IM) RETROGRADE FEMORAL NAILING wants jackson table , c-arm and biomet ;  Surgeon: Mauri Pole, MD;  Location: [facility]S;  Service: Orthopedics;  Laterality: Right;  . FRACTURE SURGERY    . INCONTINENCE SURGERY  1998  . TOTAL ABDOMINAL HYSTERECTOMY  1998     Social History   reports that she quit smoking about 3 weeks ago. Her smoking use included cigarettes. She has a 47.00 pack-year smoking history. She has never used smokeless tobacco. She reports current drug use. Drug: Marijuana. She reports that she does not drink alcohol.   Family History   Her family history includes Cancer in her mother; Emphysema in her mother; Heart failure in her father; Stroke in her father.   Allergies Allergies  Allergen Reactions  . Aspirin     Upset stomach     Home Medications  Prior to Admission medications   Medication Sig Start Date End Date Taking? Authorizing Provider  albuterol (PROVENTIL HFA;VENTOLIN HFA) 108 (90 Base) MCG/ACT inhaler Inhale 2 puffs into the lungs every 6 (six) hours as needed for wheezing or shortness of breath. 12/25/18   Icard, Leory Plowman L, DO  albuterol (PROVENTIL) (2.5 MG/3ML) 0.083% nebulizer solution Take 3 mLs (2.5 mg total) by nebulization every 6 (six) hours as needed for wheezing or shortness of breath (buy this only if you cannot afford the Duoneb). 05/29/18   Jola Schmidt, MD  amLODipine (NORVASC) 5 MG tablet Take 1 tablet (5 mg total) by mouth daily. 07/12/17   Mariel Aloe, MD  budesonide (PULMICORT) 0.5 MG/2ML nebulizer solution Take 2 mLs (0.5 mg total) by nebulization 2 (two) times daily. Please run through [facility] Dx J44.9 04/14/19 07/27/19  Martyn Ehrich, NP   doxycycline (VIBRA-TABS) 100 MG tablet Take 1 tablet (100 mg total) by mouth every 12 (twelve) hours. 07/30/19   Nita Sells, MD  feeding supplement, ENSURE ENLIVE, (ENSURE ENLIVE) LIQD Take 237 mLs by mouth 3 (three) times daily between meals. 07/11/17   Mariel Aloe, MD  formoterol (PERFOROMIST) 20 MCG/2ML nebulizer solution Take 2 mLs (20 mcg total) by nebulization 2 (two) times daily. Please run through [facility] Dx J44.9  04/14/19 07/27/19  Martyn Ehrich, NP  gabapentin (NEURONTIN) 800 MG tablet Take 800 mg by mouth 4 (four) times daily.     [provider]  methadone (DOLOPHINE) 10 MG/5ML solution Take 80 mg by mouth daily.    [provider]  Nebulizers (NEBULIZER COMPRESSOR) MISC Use as directed 12/25/18   Icard, Leory Plowman L, DO  nicotine (NICODERM CQ - DOSED IN MG/24 HOURS) 14 mg/24hr patch Place 1 patch (14 mg total) onto the skin daily. 07/30/19   Nita Sells, MD  predniSONE (DELTASONE) 10 MG tablet Take 10 mg by mouth daily with breakfast.  11/21/18   [provider]  sertraline (ZOLOFT) 100 MG tablet Take 100 mg by mouth daily.  11/21/18   [provider]     Critical care time: 66 minutes     Magdalen Spatz, MSN, AGACNP-BC [place] Pager # [phone] After 4 pm please call [phone] 08/04/2019 11:52 AM

## 2019-08-04 NOTE — Care Management Important Message (Signed)
Important Message  Patient Details  Name: Emily Moran MRN: [id number] Date of Birth: April 18, 1956   Medicare Important Message Given:  Yes     [name] 08/04/2019, 2:10 PM

## 2019-08-04 NOTE — Progress Notes (Addendum)
PROGRESS NOTE    Emily Moran  VEH:[id number] DOB: November 13, 1955 DOA: 07/31/2019  PCP: Nolene Ebbs, MD    LOS - 3   Brief Narrative: (Start on day 1 of progress note - keep it brief and live) Emily L Humbleis an 63 y.o.femalewith medical history significant ofend-stage COPD on 3 L oxygen at baselineand palliative care, HCV status post treatment, degenerative joint disease, bipolar disorder, anxiety, depression, chronic pain on methadone, hypertension, tobacco use admitted to the hospital on 10/19 for end-stage COPD and multifocal pneumonia. It was later thought that her presentation was more related to end-stage COPD than pneumonia. She was dischargedyesterday 10/22. Patient states she has continued to feel very tired and short of breath since she left the hospital. She could barely walk to her car. She also experiences pain on both sides of her chest when she feels short of breath. States she was previously using 3 L home oxygen but since leaving the hospital has increased it to 4 L. CT scan of chest done and there is evidence of masslike consolidation causing compression of the left mainstem bronchus as well as left subsegmental pulmonary arterial branches  Subjective 10/27: Patient seen, sitting up in bed.  Reports feeling about the same.  Had some increased pain overnight, morphine helped some.  Still feels scared and anxious about her situation.  States she is leaning more towards not pursuing tissue diagnosis or any treatment options for the likely advanced lung cancer.  Breathing about the same, on 4 L/min (baseline 3).    Assessment & Plan:   Principal Problem:   SOB (shortness of breath) Active Problems:   TOBACCO ABUSE   HTN (hypertension)   Chronic pain syndrome   Advanced COPD (HCC)   Lung cancer (HCC)   Acute on chronic respiratory failure with hypoxia (HCC)   Lung mass   Palliative care encounter   Dyspnea Acute on Chronic hypoxic respiratory failure  End-stage COPD New spiculated lung mass on imaging - baseline on 3 L/min -> now requiring 4 L/min - CT chest -  extensive findings concerning for malignancy and possible infection. There is also evidence of masslike consolidation causing compression of the left mainstem bronchus as well as left subsegmental pulmonary arterial branches. - CT abdomen pelvis obtained for staging - showed indeterminate 1.2 cm adrenal nodule, could reflect a benign (adrenal adenoma), but metastasis is not excluded. - Will discuss case first with pulmonary regarding feasibility of EBUS for tissue diagnosis.  I suspect this will be very high risk given her already tenuous respiratory status.  If too high risk, will see what IR may be able to offer. - pulmonary consulted for consideration of EBUS / feasability - discussed with medical oncology and radiation oncology.  Tissue diagnosis necessary to determine any options. Sometimes rad/onc will do palliative treatment for compressive symptoms without tissue dx. - palliative following, working on home palliative/hospice arrangements - morphine PRN air hunger/respiratory distress - supplemental O2 - Prednisone 10 mg daily - cont - Nebs - cont Doxy course as prescribed on prior discharge.  Currently no sign of sepsis.  Leukocytosis likely 2/2 steroid use.  - COVID-19 NEGATIVE   Chronic pain syndrome -Continue home dose of methadone - hospice to continue patient's methadone regimen outpatient, hopefully without need for daily clinic visits which patient will no longer be able to tolerate - morphine PRN pain not controlled by methadone  Hypertension -BP lower end of normal -decrease BP medications  Anxiety -Reports Klonopin works well, started  per palliative  Tobacco use -Continue NicoDerm patch which was started during her recent hospitalization.  Physical deconditioning -PT evaluation -OOB in chair for meals   DVT prophylaxis: Heparin   Code  Status: DNR  Family Communication: None at bedside Disposition Plan: likely home on palliative/hosptice, patient states unlike she will purse further work up or treatment, understands her lung disease is terminal   Consultants:   Palliative  Procedures:   None  Antimicrobials:   None  Objective: Vitals:   08/03/19 0427 08/03/19 1414 08/03/19 2207 08/04/19 0508  BP: 113/69 127/80 127/90 139/81  Pulse: 66 70 67 90  Resp: 16 (!) 21 14 16   Temp: 98.1 F (36.7 C) 98.2 F (36.8 C) 98.1 F (36.7 C) 98.4 F (36.9 C)  TempSrc: Oral Oral Oral Oral  SpO2: 91% 98% 93% 93%  Weight:      Height:       No intake or output data in the 24 hours ending 08/04/19 0809 Filed Weights   08/01/19 1305  Weight: 44.3 kg    Examination:  General exam: awake, alert, no acute distress, frail, underweight, chronically ill-appearing Respiratory system: Very poor air movement, nearly absent breath sounds left base, no audible wheezes, rales or rhonchi Cardiovascular system: normal S1/S2, RRR, no pedal edema.   Gastrointestinal system: soft, non-tender, non-distended abdomen Central nervous system: alert and oriented x4. no gross focal neurologic deficits Extremities: moves all, normal tone Psychiatry: depressed mood, congruent affect, judgement and insight appear normal    Data Reviewed: I have personally reviewed following labs and imaging studies  CBC: Recent Labs  Lab 07/31/19 1058  WBC 16.4*  HGB 11.4*  HCT 38.1  MCV 95.5  PLT 762   Basic Metabolic Panel: Recent Labs  Lab 07/29/19 0808 07/30/19 0135 07/31/19 1058  NA 141 140 140  K 3.3* 3.4* 4.1  CL 103 103 102  CO2 26 27 26   GLUCOSE 111* 82 161*  BUN 8 9 20   CREATININE 0.84 0.77 0.93  CALCIUM 8.6* 8.7* 9.3  MG 1.7 1.9  --   PHOS  --  3.5  --    GFR: Estimated Creatinine Clearance: 44.4 mL/min (by C-G formula based on SCr of 0.93 mg/dL). Liver Function Tests: Recent Labs  Lab 07/29/19 0808 07/30/19 0135   AST 14*  --   ALT 9  --   ALKPHOS 88  --   BILITOT 0.3  --   PROT 6.5  --   ALBUMIN 2.4* 2.3*   No results for input(s): LIPASE, AMYLASE in the last 168 hours. No results for input(s): AMMONIA in the last 168 hours. Coagulation Profile: No results for input(s): INR, PROTIME in the last 168 hours. Cardiac Enzymes: No results for input(s): CKTOTAL, CKMB, CKMBINDEX, TROPONINI in the last 168 hours. BNP (last 3 results) No results for input(s): PROBNP in the last 8760 hours. HbA1C: No results for input(s): HGBA1C in the last 72 hours. CBG: No results for input(s): GLUCAP in the last 168 hours. Lipid Profile: No results for input(s): CHOL, HDL, LDLCALC, TRIG, CHOLHDL, LDLDIRECT in the last 72 hours. Thyroid Function Tests: No results for input(s): TSH, T4TOTAL, FREET4, T3FREE, THYROIDAB in the last 72 hours. Anemia Panel: No results for input(s): VITAMINB12, FOLATE, FERRITIN, TIBC, IRON, RETICCTPCT in the last 72 hours. Sepsis Labs: Recent Labs  Lab 07/31/19 0000  PROCALCITON 0.36    Recent Results (from the past 240 hour(s))  SARS CORONAVIRUS 2 (TAT 6-24 HRS) Nasopharyngeal Nasopharyngeal Swab  Status: None   Collection Time: 07/27/19  1:22 PM   Specimen: Nasopharyngeal Swab  Result Value Ref Range Status   SARS Coronavirus 2 NEGATIVE NEGATIVE Final    Comment: (NOTE) SARS-CoV-2 target nucleic acids are NOT DETECTED. The SARS-CoV-2 RNA is generally detectable in upper and lower respiratory specimens during the acute phase of infection. Negative results do not preclude SARS-CoV-2 infection, do not rule out co-infections with other pathogens, and should not be used as the sole basis for treatment or other patient management decisions. Negative results must be combined with clinical observations, patient history, and epidemiological information. The expected result is Negative. Fact Sheet for Patients: SugarRoll.be Fact Sheet for Healthcare  Providers: https://www.woods-mathews.com/ This test is not yet approved or cleared by the [place] FDA and  has been authorized for detection and/or diagnosis of SARS-CoV-2 by FDA under an Emergency Use Authorization (EUA). This EUA will remain  in effect (meaning this test can be used) for the duration of the COVID-19 declaration under Section 56 4(b)(1) of the Act, 21 U.S.C. section 360bbb-3(b)(1), unless the authorization is terminated or revoked sooner. Performed at [facility], [place] [address] [place], [place] [address]   Blood culture (routine x 2)     Status: None   Collection Time: 07/27/19  3:55 PM   Specimen: BLOOD  Result Value Ref Range Status   Specimen Description BLOOD RIGHT UPPER ARM  Final   Special Requests   Final    BOTTLES DRAWN AEROBIC ONLY Blood Culture results may not be optimal due to an inadequate volume of blood received in culture bottles   Culture   Final    NO GROWTH 5 DAYS Performed at [facility], [place] [address] [place], [place] [address]    Report Status 08/01/2019 FINAL  Final  Blood culture (routine x 2)     Status: None   Collection Time: 07/27/19  4:21 PM   Specimen: BLOOD LEFT FOREARM  Result Value Ref Range Status   Specimen Description BLOOD LEFT FOREARM  Final   Special Requests   Final    BOTTLES DRAWN AEROBIC AND ANAEROBIC Blood Culture adequate volume   Culture   Final    NO GROWTH 5 DAYS Performed at [facility], [place] [address] [place], [place] [address]    Report Status 08/01/2019 FINAL  Final  SARS CORONAVIRUS 2 (TAT 6-24 HRS) Nasopharyngeal Nasopharyngeal Swab     Status: None   Collection Time: 07/31/19  8:53 PM   Specimen: Nasopharyngeal Swab  Result Value Ref Range Status   SARS Coronavirus 2 NEGATIVE NEGATIVE Final    Comment: (NOTE) SARS-CoV-2 target nucleic acids are NOT DETECTED. The SARS-CoV-2 RNA is generally detectable in upper and lower respiratory specimens during the acute  phase of infection. Negative results do not preclude SARS-CoV-2 infection, do not rule out co-infections with other pathogens, and should not be used as the sole basis for treatment or other patient management decisions. Negative results must be combined with clinical observations, patient history, and epidemiological information. The expected result is Negative. Fact Sheet for Patients: SugarRoll.be Fact Sheet for Healthcare Providers: https://www.woods-mathews.com/ This test is not yet approved or cleared by the [place] FDA and  has been authorized for detection and/or diagnosis of SARS-CoV-2 by FDA under an Emergency Use Authorization (EUA). This EUA will remain  in effect (meaning this test can be used) for the duration of the COVID-19 declaration under Section 56 4(b)(1) of the Act,  21 U.S.C. section 360bbb-3(b)(1), unless the authorization is terminated or revoked sooner. Performed at [facility], [place] [address], [place], [place] [address]          Radiology Studies: Ct Abdomen Pelvis W Contrast  Result Date: 08/02/2019 CLINICAL DATA:  Recent diagnosis of lung cancer, shortness of breath, end-stage COPD, recent weight loss, no abdominal complaints EXAM: CT ABDOMEN AND PELVIS WITH CONTRAST TECHNIQUE: Multidetector CT imaging of the abdomen and pelvis was performed using the standard protocol following bolus administration of intravenous contrast. CONTRAST:  164mL OMNIPAQUE IOHEXOL 300 MG/ML  SOLN COMPARISON:  CT chest 07/31/2019, CT abdomen pelvis 09/07/2005 FINDINGS: Lower chest: Redemonstrated emphysematous changes in the lungs with additional areas honeycombing and reticulonodular opacity in right lung base. More masslike consolidation is seen along the left lung base centrally which appears to extend from the hilar mass on comparison exam. There is increasing volume loss in the left lung base likely secondary to the  occlusion of the left mainstem bronchus seen on the comparison. Mild cardiomegaly. No pericardial effusion. Atherosclerotic plaque within the normal caliber aorta. Hepatobiliary: Focal fatty infiltration is noted along the falciform ligament. There is mild intra and extrahepatic biliary ductal dilatation without obstructing gallstone visualized. No calcified gallstones within the gallbladder lumen. No pericholecystic inflammation. Pancreas: Unremarkable. No pancreatic ductal dilatation or surrounding inflammatory changes. Spleen: Normal in size without focal abnormality. Adrenals/Urinary Tract: Indeterminate 1.2 cm nodule in the left adrenal gland. No suspicious right adrenal lesions. Bilateral areas of cortical scarring. Multiple subcentimeter hypoattenuating foci in both kidneys are too small to fully characterize on CT imaging but statistically likely benign. To characterize. Larger fluid attenuation cyst is seen in the upper pole left kidney measuring up to 2.4 cm in size. A 'brush-like' appearance of increased attenuation within renal medullary regions suggests underlying renal medullary process such as sponge kidney or nephrocalcinosis. Mild bilateral symmetric perinephric stranding, a nonspecific finding though may correlate with either age or decreased renal function. No urolithiasis or hydronephrosis. Urinary bladder is unremarkable. Stomach/Bowel: Distal esophagus, stomach and duodenal sweep are unremarkable. No small bowel wall thickening or dilatation. No evidence of obstruction. The appendix is not visualized. No pericecal inflammation is evident however. No colonic dilatation or wall thickening. Vascular/Lymphatic: Extensive atherosclerotic calcification of the aorta is noted. Multilevel ostial narrowing is incompletely assessed on this non angiographic CT. No suspicious or enlarged lymph nodes in the included lymphatic chains. Mid mesenteric congestion is seen. Reproductive: Uterus appears surgically  absent. No concerning adnexal lesions. Other: Paucity of subcutaneous and intraperitoneal fat. Mild hazy increased attenuation of the mid mesentery possibly congestion. No free air. Pelvic floor laxity. Musculoskeletal: Multilevel degenerative changes are present in the imaged portions of the spine. Findings most severe at L4-5 with right lateral listhesis of approximately 1 cm. No suspicious osseous lesions. Right femoral intramedullary nail and transcervical fixation screw are noted. IMPRESSION: 1. Indeterminate 1.2 cm adrenal nodule, while this could reflect a benign etiologies such as an adrenal adenoma, in the setting of malignancy in the chest, metastasis is not excluded. If further evaluation is clinically warranted, multiphase adrenal CT could be obtained. 2. More masslike consolidation along the left lung base centrally which appears to extend from the hilar mass on comparison exam. There is increasing volume loss in the left lung base likely secondary to the occlusion of the left mainstem bronchus seen on the comparison exam. 3. Mild intra and extrahepatic biliary ductal dilatation without obstructing gallstone visualized. Recommend further evaluation with right  upper quadrant ultrasound as clinically indicated. 4. Paucity of intraperitoneal subcutaneous fat, correlate with signs and symptoms of cachexia. 5. Multiple renal cysts. Hazy stranding of both kidneys, nonspecific but can be seen in the setting of diminished renal function. 6. Question some focally increased attenuation of the central renal medullary regions possibly medullary sponge kidney or early nephrocalcinosis. 7. Aortic Atherosclerosis (ICD10-I70.0). The Preliminary findings were discussed via telephone at the time of interpretation on 08/02/2019 at 3:45 p.m. to provider [facility], who verbally acknowledged these results. Electronically Signed   By: Lovena Le M.D.   On: 08/02/2019 16:14        Scheduled Meds: .  budesonide  0.5 mg Nebulization BID  . doxycycline  100 mg Oral Q12H  . feeding supplement (ENSURE ENLIVE)  237 mL Oral TID BM  . gabapentin  800 mg Oral QID  . heparin  5,000 Units Subcutaneous Q8H  . methadone  80 mg Oral Daily  . nicotine  14 mg Transdermal Daily  . predniSONE  10 mg Oral Q breakfast  . sertraline  100 mg Oral Daily   Continuous Infusions:   LOS: 3 days    Time spent: 35-40 min    Ezekiel Slocumb, DO [facility] Pager: [phone]  If 7PM-7AM, please contact night-coverage [email or web address] Password [email or web address] 08/04/2019, 8:09 AM

## 2019-08-05 DIAGNOSIS — E43 Unspecified severe protein-calorie malnutrition: Secondary | ICD-10-CM

## 2019-08-05 DIAGNOSIS — C3401 Malignant neoplasm of right main bronchus: Secondary | ICD-10-CM

## 2019-08-05 DIAGNOSIS — Z515 Encounter for palliative care: Secondary | ICD-10-CM

## 2019-08-05 DIAGNOSIS — Z7189 Other specified counseling: Secondary | ICD-10-CM

## 2019-08-05 DIAGNOSIS — R5381 Other malaise: Secondary | ICD-10-CM

## 2019-08-05 DIAGNOSIS — R52 Pain, unspecified: Secondary | ICD-10-CM

## 2019-08-05 MED ORDER — CLONAZEPAM 1 MG PO TABS: 1.0000 mg | ORAL_TABLET | Freq: Two times a day (BID) | ORAL | 0 refills | Status: AC | PRN

## 2019-08-05 MED ORDER — ONDANSETRON 4 MG PO TBDP: 4.0000 mg | ORAL_TABLET | Freq: Four times a day (QID) | ORAL | Status: DC | PRN

## 2019-08-05 MED ORDER — ONDANSETRON HCL 4 MG/2ML IJ SOLN
4.0000 mg | Freq: Four times a day (QID) | INTRAMUSCULAR | Status: DC | PRN
  Administered 2019-08-05: 4 mg via INTRAVENOUS
  Filled 2019-08-05: qty 2

## 2019-08-05 MED ORDER — TRAZODONE HCL 50 MG PO TABS: 50.0000 mg | ORAL_TABLET | Freq: Every evening | ORAL | 0 refills | Status: DC | PRN

## 2019-08-05 MED ORDER — TRAZODONE HCL 50 MG PO TABS: 50.0000 mg | ORAL_TABLET | Freq: Every evening | ORAL | 0 refills | Status: AC | PRN

## 2019-08-05 MED ORDER — CLONAZEPAM 1 MG PO TABS: 1.0000 mg | ORAL_TABLET | Freq: Two times a day (BID) | ORAL | 0 refills | Status: DC | PRN

## 2019-08-05 MED ORDER — METHADONE HCL 10 MG PO TABS: 80.0000 mg | ORAL_TABLET | Freq: Every day | ORAL | 0 refills | Status: AC

## 2019-08-05 MED FILL — traZODone HCL 50 MG TABS: 50 | 30 days supply | Qty: 30 | Fill #0

## 2019-08-05 MED FILL — CLONAZEPAM 1 MG TABS: 1 | 15 days supply | Qty: 30 | Fill #0

## 2019-08-05 NOTE — Social Work (Addendum)
Clinical Social Worker facilitated patient discharge including contacting patient family and [facility] to confirm patient discharge plans.  Family agreeable with plan.  Pt RN to call [facility] for transport to  [address], [address], [place], [place], [address]. RN to call [phone] to schedule [facility], if pt requires after hours [facility] please call [phone] option 1 then option 3.  Clinical Social Worker will sign off for now as social work intervention is no longer needed. Please consult [place] again if new need arises.  Westley Hummer, MSW, [place] Social Worker [phone]

## 2019-08-05 NOTE — Progress Notes (Signed)
PCCM INTERVAL PROGRESS NOTE  I have reviewed the discussions regarding treatment wishes of the patient including palliative care and radiation oncology. It appears as though Emily Moran no longer wishes for further workup or intervention and will be transitioning to a palliative care approach. With this in mind, PCCM will sign off. If we can be of any further assistance please do not hesitate to call.    Georgann Housekeeper, AGACNP-BC [place] Pager [phone] or [phone]  08/05/2019 8:00 AM

## 2019-08-05 NOTE — Discharge Summary (Signed)
Physician Discharge Summary  Emily Moran DQQ:[id number] DOB: [birth year] DOA: 07/31/2019  PCP: Nolene Ebbs, MD  Admit date: 07/31/2019 Discharge date: 08/05/2019  Admitted From: home Disposition:  Home with hospice   Recommendations for Outpatient Follow-up:  1. 4 days of Methadone has been prescribed by Palliative care at [facility] for her chronic pain- she will receive a subsequent prescription from outpatient hospice     Discharge Condition:  stable CODE STATUS:  DNR   Diet recommendation:  Regular diet Consultations:  pulm  Medical onc and rad onc  Palliative care    Discharge Diagnoses:  Principal Problem:   Acute on chronic respiratory failure with hypoxia ([place]) Active Problems:   Lung mass    Advanced COPD   TOBACCO ABUSE   HTN (hypertension)   Chronic pain syndrome   Protein-calorie malnutrition, severe   Physical deconditioning   Palliative care encounter     Brief Summary: Emily L Humbleis an 63 y.o.femalewith medical history significant ofend-stage COPD on 3 L oxygen at baselineand palliative care, HCV status post treatment, degenerative joint disease, bipolar disorder, anxiety, depression, chronic pain on methadone, hypertension, tobacco use admitted to the hospital on 10/19 for end-stage COPD and multifocal pneumonia.  It was later thought that her presentation was more related to end-stage COPD than pneumonia. She was discharged on 10/22. Patient states she has continued to feel very tired and short of breath since she left the hospital. She could barely walk to her car. She also experiences pain on both sides of her chest when she feels short of breath. States she was previously using 3 L home oxygen but since leaving the hospital has increased it to 4 L.  CT scan of chest done and there is evidence of masslike consolidation causing compression of the left mainstem bronchus as well as left subsegmental pulmonary arterial  branches   Hospital Course:  Acute on chronic hypoxic respiratory failure End-stage COPD on 3 L of oxygen at home Lung mass highly suspicious for lung cancer -CT abdomen pelvis obtained for staging - showed indeterminate 1.2 cm adrenal nodule, could reflect a benign(adrenal adenoma),butmetastasis is not excluded. -After discussions between the patient, oncology, pulmonary, palliative care [facility] hospitalist, it has been decided that the patient will go home with hospice -She will be staying with her sister who will be caring for her  Nicotine abuse - It is a moot point to discuss cessation   Chronic pain Continue methadone  Anxiety - Klonopin added by palliative care    Discharge Exam: Vitals:   08/05/19 0454 08/05/19 1326  BP: 127/77 120/78  Pulse: 76 73  Resp: 12 14  Temp: 98 F (36.7 C) 98.3 F (36.8 C)  SpO2: 93% 98%   Vitals:   08/04/19 2103 08/04/19 2240 08/05/19 0454 08/05/19 1326  BP:  138/89 127/77 120/78  Pulse:  90 76 73  Resp:  13 12 14   Temp:  98.4 F (36.9 C) 98 F (36.7 C) 98.3 F (36.8 C)  TempSrc:  Oral Oral Oral  SpO2: 96% 95% 93% 98%  Weight:      Height:        General: Pt is alert, awake, not in acute distress Cardiovascular: RRR, S1/S2 +, no rubs, no gallops Respiratory: CTA bilaterally, no wheezing, no rhonchi Abdominal: Soft, NT, ND, bowel sounds + Extremities: no edema, no cyanosis   Discharge Instructions   Allergies as of 08/05/2019      Reactions   Aspirin    Upset  stomach      Medication List    STOP taking these medications   amLODipine 5 MG tablet Commonly known as: NORVASC   doxycycline 100 MG tablet Commonly known as: VIBRA-TABS   methadone 10 MG/5ML solution Commonly known as: DOLOPHINE Replaced by: methadone 10 MG tablet     TAKE these medications   albuterol (2.5 MG/3ML) 0.083% nebulizer solution Commonly known as: PROVENTIL Take 3 mLs (2.5 mg total) by nebulization every 6 (six) hours as needed  for wheezing or shortness of breath (buy this only if you cannot afford the Duoneb).   albuterol 108 (90 Base) MCG/ACT inhaler Commonly known as: VENTOLIN HFA Inhale 2 puffs into the lungs every 6 (six) hours as needed for wheezing or shortness of breath.   budesonide 0.5 MG/2ML nebulizer solution Commonly known as: Pulmicort Take 2 mLs (0.5 mg total) by nebulization 2 (two) times daily. Please run through Medicare Dx J44.9   clonazePAM 1 MG tablet Commonly known as: KLONOPIN Take 1 tablet (1 mg total) by mouth 2 (two) times daily as needed (anxiety).   feeding supplement (ENSURE ENLIVE) Liqd Take 237 mLs by mouth 3 (three) times daily between meals.   gabapentin 800 MG tablet Commonly known as: NEURONTIN Take 800 mg by mouth 4 (four) times daily.   methadone 10 MG tablet Commonly known as: DOLOPHINE Take 8 tablets (80 mg total) by mouth daily for 4 days. Start taking on: August 06, 2019 Replaces: methadone 10 MG/5ML solution   Nebulizer Compressor Misc Use as directed   nicotine 14 mg/24hr patch Commonly known as: NICODERM CQ - dosed in mg/24 hours Place 1 patch (14 mg total) onto the skin daily.   Perforomist 20 MCG/2ML nebulizer solution Generic drug: formoterol Take 2 mLs (20 mcg total) by nebulization 2 (two) times daily. Please run through Medicare Dx J44.9   predniSONE 10 MG tablet Commonly known as: DELTASONE Take 10 mg by mouth daily with breakfast.   sertraline 100 MG tablet Commonly known as: ZOLOFT Take 100 mg by mouth daily.   traZODone 50 MG tablet Commonly known as: DESYREL Take 1 tablet (50 mg total) by mouth at bedtime as needed for sleep.       Allergies  Allergen Reactions  . Aspirin     Upset stomach     Procedures/Studies:    Dg Chest 2 View  Result Date: 07/27/2019 CLINICAL DATA:  Chest pain, shortness of breath. EXAM: CHEST - 2 VIEW COMPARISON:  12/05/2018 FINDINGS: Interstitial and airspace opacity superimposed on background  emphysema in the left lung base and lingula obscuring the left cardiac border. To a lesser extent this is seen also on the right. Large bullous changes in the right upper lobe are similar to the previous study. Cardiomediastinal contours are stable. Signs of cervical spinal fusion are partially imaged. Lungs remain hyperinflated. IMPRESSION: 1. Findings are most consistent with multifocal pneumonia superimposed on background emphysema. Recommend follow-up to resolution. 2. Stable bullous changes in the right upper lobe. Electronically Signed   By: Zetta Bills M.D.   On: 07/27/2019 12:44   Ct Chest W Contrast  Result Date: 07/31/2019 CLINICAL DATA:  Cough shortness of breath generalized weakness EXAM: CT CHEST WITH CONTRAST TECHNIQUE: Multidetector CT imaging of the chest was performed during intravenous contrast administration. CONTRAST:  87mL OMNIPAQUE IOHEXOL 300 MG/ML  SOLN COMPARISON:  July 08, 2017 FINDINGS: Cardiovascular: No filling defects are seen within the central or segmental pulmonary arteries. Due to a large mass like area  of consolidation in the left lower lobe and lingula there does however appear to be narrowing of the subsegmental lower lobe pulmonary branches. There is mild cardiomegaly. No definite evidence of right ventricular heart strain. Aortic atherosclerosis is noted. There is a patent 3 vessel arch. There is mild prominence to the main pulmonary arteries which could be due to pulmonary artery hypertension. Mediastinum/Nodes: There is large subcarinal adenopathy measuring 1.7 cm in transverse dimension. The large soft tissue mass within the left lower lobe extends into the left hilum. There is also pre-vascular adenopathy measuring 1.5 cm in transverse dimension. The esophagus is unremarkable. Lungs/Pleura: Again noted is extensive centrilobular emphysematous changes, right worse than left. Along the major fissure in the right lung there is a spiculated nodular opacity which  appears enlarged from the prior exam measuring 3.5 x 1.9 cm. Within the right lower lobe there is ground-glass opacities and interstitial thickening, which also extends into the right middle lobe. There is a large heterogeneous ill-defined soft tissue mass seen within the lingula and left lower lobe which extends into the left hilum. There is adjacent interstitial thickening and small nodular ill-defined opacity. There is also adjacent honeycombing and reticulonodular opacities in the left lower lung. The mass appears to invade the left hilum and mediastinum and compress the left lower lobe mainstem bronchus. There is a narrowing of subsegmental branches of the pulmonary arteries within the left lower lobe and lingula. Upper Abdomen: No acute abnormalities present in the visualized portions of the upper abdomen. Musculoskeletal: No chest wall abnormality. No acute or significant osseous findings. IMPRESSION: 1. New large soft tissue masslike consolidation within the left lower lobe and lingula that likely represents pulmonary neoplasm, with possible superimposed infectious etiology. 2. 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 3. The masslike consolidation extends into the left hilum and mediastinum and causes compression of the left mainstem bronchus as well as left subsegmental pulmonary arterial branches. 4. Subcarinal and prevascular adenopathy. 5. Ill-defined ground-glass opacities with adjacent reticulonodular opacities in the right lower lung and right middle lobe which could represent lymphangitic carcinomatosis/inflammatory changes. 6. Ill-defined spiculated nodule measuring 3.5 x 1.9 cm along the right major fissure which has slightly increased in size from the prior exam, and could also represent a pulmonary neoplasm versus worsening scarring. 7. Extensive centrilobular  emphysematous changes. 8.  Aortic Atherosclerosis (ICD10-I70.0). These results were called by telephone at the time of interpretation on 07/31/2019 at 7:18 pm to provider [facility] , who verbally acknowledged these results. Electronically Signed   By: Prudencio Pair M.D.   On: 07/31/2019 19:23   Ct Abdomen Pelvis W Contrast  Result Date: 08/02/2019 CLINICAL DATA:  Recent diagnosis of lung cancer, shortness of breath, end-stage COPD, recent weight loss, no abdominal complaints EXAM: CT ABDOMEN AND PELVIS WITH CONTRAST TECHNIQUE: Multidetector CT imaging of the abdomen and pelvis was performed using the standard protocol following bolus administration of intravenous contrast. CONTRAST:  123mL OMNIPAQUE IOHEXOL 300 MG/ML  SOLN COMPARISON:  CT chest 07/31/2019, CT abdomen pelvis 09/07/2005 FINDINGS: Lower chest: Redemonstrated emphysematous changes in the lungs with additional areas honeycombing and reticulonodular opacity in right lung base. More masslike consolidation is seen along the left lung base centrally which appears to extend from the hilar mass on comparison exam. There is increasing volume loss in the left lung base likely secondary to the occlusion of the left mainstem bronchus seen on the comparison. Mild  cardiomegaly. No pericardial effusion. Atherosclerotic plaque within the normal caliber aorta. Hepatobiliary: Focal fatty infiltration is noted along the falciform ligament. There is mild intra and extrahepatic biliary ductal dilatation without obstructing gallstone visualized. No calcified gallstones within the gallbladder lumen. No pericholecystic inflammation. Pancreas: Unremarkable. No pancreatic ductal dilatation or surrounding inflammatory changes. Spleen: Normal in size without focal abnormality. Adrenals/Urinary Tract: Indeterminate 1.2 cm nodule in the left adrenal gland. No suspicious right adrenal lesions. Bilateral areas of cortical scarring. Multiple subcentimeter hypoattenuating foci in  both kidneys are too small to fully characterize on CT imaging but statistically likely benign. To characterize. Larger fluid attenuation cyst is seen in the upper pole left kidney measuring up to 2.4 cm in size. A 'brush-like' appearance of increased attenuation within renal medullary regions suggests underlying renal medullary process such as sponge kidney or nephrocalcinosis. Mild bilateral symmetric perinephric stranding, a nonspecific finding though may correlate with either age or decreased renal function. No urolithiasis or hydronephrosis. Urinary bladder is unremarkable. Stomach/Bowel: Distal esophagus, stomach and duodenal sweep are unremarkable. No small bowel wall thickening or dilatation. No evidence of obstruction. The appendix is not visualized. No pericecal inflammation is evident however. No colonic dilatation or wall thickening. Vascular/Lymphatic: Extensive atherosclerotic calcification of the aorta is noted. Multilevel ostial narrowing is incompletely assessed on this non angiographic CT. No suspicious or enlarged lymph nodes in the included lymphatic chains. Mid mesenteric congestion is seen. Reproductive: Uterus appears surgically absent. No concerning adnexal lesions. Other: Paucity of subcutaneous and intraperitoneal fat. Mild hazy increased attenuation of the mid mesentery possibly congestion. No free air. Pelvic floor laxity. Musculoskeletal: Multilevel degenerative changes are present in the imaged portions of the spine. Findings most severe at L4-5 with right lateral listhesis of approximately 1 cm. No suspicious osseous lesions. Right femoral intramedullary nail and transcervical fixation screw are noted. IMPRESSION: 1. Indeterminate 1.2 cm adrenal nodule, while this could reflect a benign etiologies such as an adrenal adenoma, in the setting of malignancy in the chest, metastasis is not excluded. If further evaluation is clinically warranted, multiphase adrenal CT could be obtained. 2.  More masslike consolidation along the left lung base centrally which appears to extend from the hilar mass on comparison exam. There is increasing volume loss in the left lung base likely secondary to the occlusion of the left mainstem bronchus seen on the comparison exam. 3. Mild intra and extrahepatic biliary ductal dilatation without obstructing gallstone visualized. Recommend further evaluation with right upper quadrant ultrasound as clinically indicated. 4. Paucity of intraperitoneal subcutaneous fat, correlate with signs and symptoms of cachexia. 5. Multiple renal cysts. Hazy stranding of both kidneys, nonspecific but can be seen in the setting of diminished renal function. 6. Question some focally increased attenuation of the central renal medullary regions possibly medullary sponge kidney or early nephrocalcinosis. 7. Aortic Atherosclerosis (ICD10-I70.0). The Preliminary findings were discussed via telephone at the time of interpretation on 08/02/2019 at 3:45 p.m. to provider [facility], who verbally acknowledged these results. Electronically Signed   By: Lovena Le M.D.   On: 08/02/2019 16:14   Dg Chest Port 1 View  Result Date: 07/31/2019 CLINICAL DATA:  Shortness of breath. EXAM: PORTABLE CHEST 1 VIEW COMPARISON:  Chest x-ray dated 09/26/2019 FINDINGS: There has been significant progression of the infiltrates in the left mid and lower lung zone with new infiltrate at the right lung base. There is increased fullness at the inferior aspect of the left hilum as well as at the left base medially.  The patient has severe emphysematous changes in both upper lobes, right greater than left. No effusions. Aortic atherosclerosis. No acute bone abnormality. IMPRESSION: 1. Progressive infiltrates in the left mid and lower lung zone and new infiltrate at the right lung base. This could represent progressive pneumonia but the possibility of lymphangitic tumor should also be considered. 2. New fullness  at the inferior aspect of the left hilum as well as the left base medially. I am concerned that there may be an underlying mass in the left lung. CT scan of the chest with contrast recommended for further evaluation. 3. Emphysema. Electronically Signed   By: Lorriane Shire M.D.   On: 07/31/2019 15:02     The results of significant diagnostics from this hospitalization (including imaging, microbiology, ancillary and laboratory) are listed below for reference.     Microbiology: Recent Results (from the past 240 hour(s))  SARS CORONAVIRUS 2 (TAT 6-24 HRS) Nasopharyngeal Nasopharyngeal Swab     Status: None   Collection Time: 07/27/19  1:22 PM   Specimen: Nasopharyngeal Swab  Result Value Ref Range Status   SARS Coronavirus 2 NEGATIVE NEGATIVE Final    Comment: (NOTE) SARS-CoV-2 target nucleic acids are NOT DETECTED. The SARS-CoV-2 RNA is generally detectable in upper and lower respiratory specimens during the acute phase of infection. Negative results do not preclude SARS-CoV-2 infection, do not rule out co-infections with other pathogens, and should not be used as the sole basis for treatment or other patient management decisions. Negative results must be combined with clinical observations, patient history, and epidemiological information. The expected result is Negative. Fact Sheet for Patients: SugarRoll.be Fact Sheet for Healthcare Providers: https://www.woods-mathews.com/ This test is not yet approved or cleared by the [place] FDA and  has been authorized for detection and/or diagnosis of SARS-CoV-2 by FDA under an Emergency Use Authorization (EUA). This EUA will remain  in effect (meaning this test can be used) for the duration of the COVID-19 declaration under Section 56 4(b)(1) of the Act, 21 U.S.C. section 360bbb-3(b)(1), unless the authorization is terminated or revoked sooner. Performed at [facility], [place] [address] [place], [place] [address]   Blood culture (routine x 2)     Status: None   Collection Time: 07/27/19  3:55 PM   Specimen: BLOOD  Result Value Ref Range Status   Specimen Description BLOOD RIGHT UPPER ARM  Final   Special Requests   Final    BOTTLES DRAWN AEROBIC ONLY Blood Culture results may not be optimal due to an inadequate volume of blood received in culture bottles   Culture   Final    NO GROWTH 5 DAYS Performed at [facility], [place] [address], [place], [place] [address]    Report Status 08/01/2019 FINAL  Final  Blood culture (routine x 2)     Status: None   Collection Time: 07/27/19  4:21 PM   Specimen: BLOOD LEFT FOREARM  Result Value Ref Range Status   Specimen Description BLOOD LEFT FOREARM  Final   Special Requests   Final    BOTTLES DRAWN AEROBIC AND ANAEROBIC Blood Culture adequate volume   Culture   Final    NO GROWTH 5 DAYS Performed at [facility], [place] [address], [place], [place] [address]    Report Status 08/01/2019 FINAL  Final  SARS CORONAVIRUS 2 (TAT 6-24 HRS) Nasopharyngeal Nasopharyngeal Swab     Status: None   Collection Time: 07/31/19  8:53 PM   Specimen:  Nasopharyngeal Swab  Result Value Ref Range Status   SARS Coronavirus 2 NEGATIVE NEGATIVE Final    Comment: (NOTE) SARS-CoV-2 target nucleic acids are NOT DETECTED. The SARS-CoV-2 RNA is generally detectable in upper and lower respiratory specimens during the acute phase of infection. Negative results do not preclude SARS-CoV-2 infection, do not rule out co-infections with other pathogens, and should not be used as the sole basis for treatment or other patient management decisions. Negative results must be combined with clinical observations, patient history, and epidemiological information. The expected result is Negative. Fact Sheet for Patients: SugarRoll.be Fact Sheet for Healthcare Providers: https://www.woods-mathews.com/ This test is  not yet approved or cleared by the [place] FDA and  has been authorized for detection and/or diagnosis of SARS-CoV-2 by FDA under an Emergency Use Authorization (EUA). This EUA will remain  in effect (meaning this test can be used) for the duration of the COVID-19 declaration under Section 56 4(b)(1) of the Act, 21 U.S.C. section 360bbb-3(b)(1), unless the authorization is terminated or revoked sooner. Performed at [facility], [place] [address], [place], [place] [address]      Labs: BNP (last 3 results) No results for input(s): BNP in the last 8760 hours. Basic Metabolic Panel: Recent Labs  Lab 07/30/19 0135 07/31/19 1058  NA 140 140  K 3.4* 4.1  CL 103 102  CO2 27 26  GLUCOSE 82 161*  BUN 9 20  CREATININE 0.77 0.93  CALCIUM 8.7* 9.3  MG 1.9  --   PHOS 3.5  --    Liver Function Tests: Recent Labs  Lab 07/30/19 0135  ALBUMIN 2.3*   No results for input(s): LIPASE, AMYLASE in the last 168 hours. No results for input(s): AMMONIA in the last 168 hours. CBC: Recent Labs  Lab 07/31/19 1058  WBC 16.4*  HGB 11.4*  HCT 38.1  MCV 95.5  PLT 392   Cardiac Enzymes: No results for input(s): CKTOTAL, CKMB, CKMBINDEX, TROPONINI in the last 168 hours. BNP: Invalid input(s): POCBNP CBG: No results for input(s): GLUCAP in the last 168 hours. D-Dimer No results for input(s): DDIMER in the last 72 hours. Hgb A1c No results for input(s): HGBA1C in the last 72 hours. Lipid Profile No results for input(s): CHOL, HDL, LDLCALC, TRIG, CHOLHDL, LDLDIRECT in the last 72 hours. Thyroid function studies No results for input(s): TSH, T4TOTAL, T3FREE, THYROIDAB in the last 72 hours.  Invalid input(s): FREET3 Anemia work up No results for input(s): VITAMINB12, FOLATE, FERRITIN, TIBC, IRON, RETICCTPCT in the last 72 hours. Urinalysis    Component Value Date/Time   COLORURINE AMBER (A) 04/05/2016 2127   APPEARANCEUR CLOUDY (A) 04/05/2016 2127   LABSPEC 1.021 04/05/2016  2127   PHURINE 5.5 04/05/2016 2127   GLUCOSEU NEGATIVE 04/05/2016 2127   HGBUR TRACE (A) 04/05/2016 2127   BILIRUBINUR SMALL (A) 04/05/2016 2127   KETONESUR NEGATIVE 04/05/2016 2127   PROTEINUR 100 (A) 04/05/2016 2127   UROBILINOGEN 4.0 (H) 06/03/2012 1648   NITRITE NEGATIVE 04/05/2016 2127   LEUKOCYTESUR TRACE (A) 04/05/2016 2127   Sepsis Labs Invalid input(s): PROCALCITONIN,  WBC,  LACTICIDVEN Microbiology Recent Results (from the past 240 hour(s))  SARS CORONAVIRUS 2 (TAT 6-24 HRS) Nasopharyngeal Nasopharyngeal Swab     Status: None   Collection Time: 07/27/19  1:22 PM   Specimen: Nasopharyngeal Swab  Result Value Ref Range Status   SARS Coronavirus 2 NEGATIVE NEGATIVE Final    Comment: (NOTE) SARS-CoV-2 target nucleic acids are NOT DETECTED. The SARS-CoV-2 RNA is  generally detectable in upper and lower respiratory specimens during the acute phase of infection. Negative results do not preclude SARS-CoV-2 infection, do not rule out co-infections with other pathogens, and should not be used as the sole basis for treatment or other patient management decisions. Negative results must be combined with clinical observations, patient history, and epidemiological information. The expected result is Negative. Fact Sheet for Patients: SugarRoll.be Fact Sheet for Healthcare Providers: https://www.woods-mathews.com/ This test is not yet approved or cleared by the [place] FDA and  has been authorized for detection and/or diagnosis of SARS-CoV-2 by FDA under an Emergency Use Authorization (EUA). This EUA will remain  in effect (meaning this test can be used) for the duration of the COVID-19 declaration under Section 56 4(b)(1) of the Act, 21 U.S.C. section 360bbb-3(b)(1), unless the authorization is terminated or revoked sooner. Performed at [facility], [place] [address], [place], [place] [address]   Blood culture (routine x 2)      Status: None   Collection Time: 07/27/19  3:55 PM   Specimen: BLOOD  Result Value Ref Range Status   Specimen Description BLOOD RIGHT UPPER ARM  Final   Special Requests   Final    BOTTLES DRAWN AEROBIC ONLY Blood Culture results may not be optimal due to an inadequate volume of blood received in culture bottles   Culture   Final    NO GROWTH 5 DAYS Performed at [facility], [place] [address], [place], [place] [address]    Report Status 08/01/2019 FINAL  Final  Blood culture (routine x 2)     Status: None   Collection Time: 07/27/19  4:21 PM   Specimen: BLOOD LEFT FOREARM  Result Value Ref Range Status   Specimen Description BLOOD LEFT FOREARM  Final   Special Requests   Final    BOTTLES DRAWN AEROBIC AND ANAEROBIC Blood Culture adequate volume   Culture   Final    NO GROWTH 5 DAYS Performed at [facility], [place] [address] [place], [place] [address]    Report Status 08/01/2019 FINAL  Final  SARS CORONAVIRUS 2 (TAT 6-24 HRS) Nasopharyngeal Nasopharyngeal Swab     Status: None   Collection Time: 07/31/19  8:53 PM   Specimen: Nasopharyngeal Swab  Result Value Ref Range Status   SARS Coronavirus 2 NEGATIVE NEGATIVE Final    Comment: (NOTE) SARS-CoV-2 target nucleic acids are NOT DETECTED. The SARS-CoV-2 RNA is generally detectable in upper and lower respiratory specimens during the acute phase of infection. Negative results do not preclude SARS-CoV-2 infection, do not rule out co-infections with other pathogens, and should not be used as the sole basis for treatment or other patient management decisions. Negative results must be combined with clinical observations, patient history, and epidemiological information. The expected result is Negative. Fact Sheet for Patients: SugarRoll.be Fact Sheet for Healthcare Providers: https://www.woods-mathews.com/ This test is not yet approved or cleared by the [place] FDA and  has  been authorized for detection and/or diagnosis of SARS-CoV-2 by FDA under an Emergency Use Authorization (EUA). This EUA will remain  in effect (meaning this test can be used) for the duration of the COVID-19 declaration under Section 56 4(b)(1) of the Act, 21 U.S.C. section 360bbb-3(b)(1), unless the authorization is terminated or revoked sooner. Performed at [facility], [place] [address], [place], [place] [address]      Time coordinating discharge in minutes: 65  SIGNED:   Debbe Odea, MD  [facility] 08/05/2019, 2:34 PM Pager   If 7PM-7AM, please contact night-coverage [email or web address] Password [email or web address]

## 2019-08-05 NOTE — Progress Notes (Addendum)
Reviewed discharge paperwork with patient. All questions answered at this time. Waiting on call from patient's daughter confirming DME equipment has been delivered to the home. PTAR will then be called to schedule transport.  [place] to confirm if the equipment has been delivered to [place] home. Will receive a call back from the on call at [facility].

## 2019-08-05 NOTE — Progress Notes (Addendum)
Spoke with patient's daughter Joellen Jersey to confirm if equipment has been delivered to [place] home where [place] will be living. Joellen Jersey states she is waiting to hear from aunt. Joellen Jersey was not aware she needed to call 6N when equipment was delivered. Instructed Katie to call 6N, providing her the phone number to call when equipment has been delivered.   4 Spoke with [place] trying to confirm if equipment has been delivered to [facility] [employer] [facility] home. Anderson Malta with [facility] states she will provide a return call with confirmation.  1955 Received call from [employer] stating all equipment has been delivered to the home. Will call to schedule PTAR.  2000 PTAR is scheduled, patient is the 8th on the list.

## 2019-08-05 NOTE — Progress Notes (Signed)
PALLIATIVE NOTE:  Chart Reviewed. Ms. Deckman in bed resting. Easily aroused to verbal command. Denies pan at this time. States she is happy to be going home and her sister is at the house making arrangements for her to be discharged today or tomorrow. Awaiting equipment delivery from hospice etc.   Patient appreciative of the support and care given.   DNR form completed and placed on chart for discharge.   Patient expressed goals remain home with hospice with her sister, No further work-up. She is looking forward to spending what time she has left with family and friends in the comfort of their home. Support given.   All questions answered.   Plan -D/C home with hospice support once equipment delivered -Continue on Methadone. Hospice will manage once patient has been discharged if pain specialist not is not in agreement to continue prescribing.  -PMT will continue to support as needed while hospitalized.   Total Time: 20 min.   Greater than 50%  of this time was spent counseling and coordinating care related to the above assessment and plan.  Alda Lea, AGPCNP-BC Palliative Medicine Team  Phone: [phone]

## 2019-08-05 NOTE — Progress Notes (Signed)
Nutrition Brief Note  Chart reviewed. Pt with L hiliar mass and end stage COPD. Palliative care, critical care, and oncology have been consulted; pt does not desire further work-up of treatment for this. Pt will transition to hospice care with comfort based approach.  No further nutrition interventions warranted at this time.  Please re-consult as needed.   Abir Craine A. Jimmye Norman, RD, LDN, [place] Registered Dietitian II Certified Diabetes Care and Education Specialist Pager: [phone] After hours Pager: [phone]

## 2019-08-05 NOTE — Progress Notes (Signed)
PMT progress note  Patient is awake alert sitting up in bed. She is anticipating discharge this evening. She appears mildly anxious, denies pain, denies dyspnea  PPS 40%  Awake alert Non focal Regular pattern of breathing Thin lady Abdomen soft non tender S1 S2 No edema  Labs and chart reviewed.  Discussed with TRH MD and also with palliative colleague Ms Cousar NP. Also discussed with bedside RN.   Methadone prescription signed, discussed with hospice liaison.   Patient again endorses focusing on full comfort measures.   Plan: Home with  Hospice.  25 minutes spent Loistine Chance MD [place] palliative Greater than 50% of this time was spent counseling and coordinating care related to the above assessment and plan

## 2019-08-05 NOTE — Progress Notes (Signed)
Physical Therapy Treatment Patient Details Name: Emily Moran MRN: [id number] DOB: Feb 11, 1956 Today's Date: 08/05/2019    History of Present Illness Pt is a 63 y.o. female admitted on 07/31/19 for increased SOB and progressive weakness after bing d/c from [facility] on 07/30/19.  CT scan this admission revealed new lung mass.  COVID (-) and pt active hospice patient.  Pt with other significant PMH of tobacco use, spinal stenosis, home O2, HTN, hepatitis C, DDD, COPD, anemia, R femur IM nail, back surgery, ACDF.      PT Comments    Pt standing at EOB straightening her bed out upon PT arrival. Pt performing transfers with supervision and bed mobility at modified independence. Pt declining any further mobility at this time. Discussed planned d/c home with sister and home safety. Pt's room phone ringing at end and pt indicating that she did not wish to further participate in PT at this time. PT asked if pt wanted to be d/c'd from acute PT services, but pt stated that she would still like for PT to check in on her for now. Pt would continue to benefit from skilled physical therapy services at this time while admitted and after d/c to address the below listed limitations in order to improve overall safety and independence with functional mobility.    Follow Up Recommendations  Home health PT;Supervision - Intermittent;Other (comment)(home with Hospice)     Equipment Recommendations  None recommended by PT    Recommendations for Other Services       Precautions / Restrictions Precautions Precautions: Fall;Other (comment) Precaution Comments: monitor SPO2 Restrictions Weight Bearing Restrictions: No    Mobility  Bed Mobility Overal bed mobility: Modified Independent                Transfers Overall transfer level: Needs assistance Equipment used: None Transfers: Sit to/from Stand Sit to Stand: Supervision         General transfer comment: supervision for safety with return to  sit  Ambulation/Gait             General Gait Details: pt declining further mobility at this time   Stairs             Wheelchair Mobility    Modified Rankin (Stroke Patients Only)       Balance Overall balance assessment: Needs assistance Sitting-balance support: Feet supported Sitting balance-Leahy Scale: Good     Standing balance support: During functional activity;No upper extremity supported Standing balance-Leahy Scale: Fair                              Cognition Arousal/Alertness: Awake/alert Behavior During Therapy: WFL for tasks assessed/performed Overall Cognitive Status: Within Functional Limits for tasks assessed                                        Exercises      General Comments        Pertinent Vitals/Pain Pain Assessment: No/denies pain    Home Living                      Prior Function            PT Goals (current goals can now be found in the care plan section) Acute Rehab PT Goals PT Goal Formulation: With patient Time For Goal Achievement:  08/15/19 Potential to Achieve Goals: Good Progress towards PT goals: Progressing toward goals    Frequency    Min 3X/week      PT Plan Current plan remains appropriate    Co-evaluation              AM-PAC PT "6 Clicks" Mobility   Outcome Measure  Help needed turning from your back to your side while in a flat bed without using bedrails?: None Help needed moving from lying on your back to sitting on the side of a flat bed without using bedrails?: None Help needed moving to and from a bed to a chair (including a wheelchair)?: None Help needed standing up from a chair using your arms (e.g., wheelchair or bedside chair)?: None Help needed to walk in hospital room?: A Little Help needed climbing 3-5 steps with a railing? : A Little 6 Click Score: 22    End of Session Equipment Utilized During Treatment: Oxygen(4L of O2) Activity  Tolerance: Patient limited by fatigue Patient left: in bed;with call bell/phone within reach Nurse Communication: Mobility status PT Visit Diagnosis: Unsteadiness on feet (R26.81);Muscle weakness (generalized) (M62.81)     Time: 1359-1410 PT Time Calculation (min) (ACUTE ONLY): 11 min  Charges:  $Therapeutic Activity: 8-22 mins                     Sherie Don, PT, DPT  Acute Rehabilitation Services Pager [phone] Office [place] 08/05/2019, 2:27 PM

## 2019-08-05 NOTE — Progress Notes (Signed)
Patient discharged from Hospital.  Patient will be going home with her sister Tye [place].  Patient's daughter Joellen Jersey to transport patient to [employer].  Katie had full 02 tank to transport patient to patient's sister's home today.  I have informed Hospice Nurse Anderson Malta with [facility] of the above information.  Updated patient's MAR and reviewed discharge medications again with patient.  Patient was given prescription medications, DNR form, Hospital AVS, and prescription for Methadone. Patient verbalized understanding.

## 2019-08-05 NOTE — TOC Transition Note (Signed)
Transition of Care [facility]) - CM/SW Discharge Note   Patient Details  Name: Emily Moran MRN: [id number] Date of Birth: 1956/05/26  Transition of Care [facility]) CM/SW Contact:  Alexander Mt, [place] Phone Number: 08/05/2019, 4:24 PM   Clinical Narrative:    Pt plan for dc today to sisters home address. PTAR papers sent to floor for RN to call PTAR when pt equipment is delivered. CSW spoke with [name], [place] liaison who has been assisting family with DME and care and she will let pt daughter know to call [phone] when equipment there. Pt scripts cancelled and resent to [facility], will come to bedside. Pt methadone script has been printed and she will need to fill that at an outpatient pharmacy- [name] will update pt family with this and pt bedside RN Mendel Ryder also aware.    Final next level of care: Home w Hospice Care Barriers to Discharge: Equipment Delay   Patient Goals and CMS Choice Patient states their goals for this hospitalization and ongoing recovery are:: discharge with hospice to sisters home CMS Medicare.gov Compare Post Acute Care list provided to:: Other (Comment Required)(n/a) Choice offered to / list presented to : Patient, Sibling  Discharge Placement  Discharge Plan and Services In-house Referral: Clinical Social Work Discharge Planning Services: CM Consult Post Acute Care Choice: Resumption of Svcs/PTA Provider, Hospice, Durable Medical Equipment          DME Arranged: Other see comment(per hospice) DME Agency: [facility] Date DME Agency Contacted: 08/04/19 Time DME Agency Contacted: [phone] Representative spoke with at DME Agency: [place]: RN, Nurse's Aide, Disease Management [place] Agency: Hospice and [place] Date [place]: 08/04/19 Time [place]: 1450 Representative spoke with at [place]: [place]  ([place]) Interventions     Readmission Risk Interventions Readmission Risk Prevention Plan 08/04/2019  Transportation Screening Complete  PCP or Specialist Appt within 3-5 Days Not Complete  Not Complete comments plan for hospice  HRI or Reform Complete  Social Work Consult for [place] Planning/Counseling Complete  Palliative Care Screening Complete  Medication Review Press photographer) Complete  Some recent data might be hidden

## 2019-08-05 NOTE — Progress Notes (Signed)
PTAR called and ambulance transport cancelled.  I have informed PTAR that patient's family transported patient home today.

## 2019-08-22 ENCOUNTER — Other Ambulatory Visit: Payer: Self-pay

## 2019-08-22 ENCOUNTER — Emergency Department (HOSPITAL_COMMUNITY)
Admission: EM | Admit: 2019-08-22 | Discharge: 2019-08-22 | Disposition: A | Discharge status: Home / Self Care | Payer: Medicare Other | Attending: Emergency Medicine | Admitting: Emergency Medicine

## 2019-08-22 ENCOUNTER — Encounter (HOSPITAL_COMMUNITY): Payer: Self-pay | Admitting: Emergency Medicine

## 2019-08-22 DIAGNOSIS — T50901A Poisoning by unspecified drugs, medicaments and biological substances, accidental (unintentional), initial encounter: Secondary | ICD-10-CM | POA: Diagnosis not present

## 2019-08-22 DIAGNOSIS — Z515 Encounter for palliative care: Secondary | ICD-10-CM

## 2019-08-22 DIAGNOSIS — I10 Essential (primary) hypertension: Secondary | ICD-10-CM | POA: Insufficient documentation

## 2019-08-22 DIAGNOSIS — Z79899 Other long term (current) drug therapy: Secondary | ICD-10-CM | POA: Insufficient documentation

## 2019-08-22 DIAGNOSIS — Z87891 Personal history of nicotine dependence: Secondary | ICD-10-CM | POA: Insufficient documentation

## 2019-08-22 DIAGNOSIS — J449 Chronic obstructive pulmonary disease, unspecified: Secondary | ICD-10-CM | POA: Insufficient documentation

## 2019-08-22 LAB — CBC
HCT: 36.9 % (ref 36.0–46.0)
Hemoglobin: 11.4 g/dL — ABNORMAL LOW (ref 12.0–15.0)
MCH: 28.9 pg (ref 26.0–34.0)
MCHC: 30.9 g/dL (ref 30.0–36.0)
MCV: 93.7 fL (ref 80.0–100.0)
Platelets: 253 10*3/uL (ref 150–400)
RBC: 3.94 MIL/uL (ref 3.87–5.11)
RDW: 15.9 % — ABNORMAL HIGH (ref 11.5–15.5)
WBC: 9.4 10*3/uL (ref 4.0–10.5)
nRBC: 0 % (ref 0.0–0.2)

## 2019-08-22 LAB — BASIC METABOLIC PANEL
Anion gap: 9 (ref 5–15)
BUN: 17 mg/dL (ref 8–23)
CO2: 27 mmol/L (ref 22–32)
Calcium: 8.7 mg/dL — ABNORMAL LOW (ref 8.9–10.3)
Chloride: 96 mmol/L — ABNORMAL LOW (ref 98–111)
Creatinine, Ser: 0.82 mg/dL (ref 0.44–1.00)
GFR calc Af Amer: >60 mL/min (ref >60)
GFR calc non Af Amer: >60 mL/min (ref >60)
Glucose, Bld: 89 mg/dL (ref 70–99)
Potassium: 4.3 mmol/L (ref 3.5–5.1)
Sodium: 132 mmol/L — ABNORMAL LOW (ref 135–145)

## 2019-08-22 NOTE — ED Provider Notes (Signed)
[facility] EMERGENCY DEPARTMENT Provider Note   CSN: [id number] Arrival date & time: 08/22/19  1028     History   Chief Complaint Chief Complaint  Patient presents with  . Ingestion    HPI Emily Moran is a 63 y.o. female.  Presents to ER after accidental ingestion of extra amlodipine.  Patient states she confused her amlodipine pills for her methadone pills.  Normally takes 1 tablet 5mg  daily of amlodipine.  Normally takes 6 to 8 tablets of her methadone.  She denies any chest pain, difficulty breathing, lightheadedness, syncope.  Came to ER concerned that something could be wrong due to the overdose.  Completed chart review, patient recently placed on palliative care, patient confirms primary goal is comfort care.  Though she is continuing to take some of her chronic medications patient uses chronic 5 L nasal cannula supplemental oxygen.     HPI  Past Medical History:  Diagnosis Date  . Anemia    "as a child"  . Anxiety   . Arthritis    "shoulders; back" (03/16/2015)  . Chronic back pain   . Chronic bronchitis ([place])    "I basically keep it" (03/16/2015)  . Chronic pain   . COPD (chronic obstructive pulmonary disease) ([place])   . DDD (degenerative disc disease), lumbar   . Degenerative disc disease   . Depression   . Ectatic aorta ([place]) 07/08/2017  . Family history of adverse reaction to anesthesia    "my mother had an allergic reaction when she had kidney removed back in the '60's or '70's""  . Headache    "monthly; need glasses; comes on when I'm stressed or get too tired" (03/16/2015)  . Hepatitis C   . History of stomach ulcers   . Hypertension   . Kidney stone   . MRSA carrier 07/09/2017  . On home O2    "~ 3L once/wk and prn" (03/16/2015)  . Pain management   . Pneumonia    "@ least once/yr for the past 8 yrs" (03/16/2015)  . Spinal stenosis   . Tobacco use     Patient Active Problem List   Diagnosis Date Noted  . Palliative care by  specialist   . Goals of care, counseling/discussion   . Generalized pain   . Acute on chronic respiratory failure with hypoxia ([place])   . Lung mass   . Palliative care encounter   . Advanced COPD ([place]) 07/31/2019  . Multifocal pneumonia 07/27/2019  . Pneumonia 07/27/2019  . Grief 04/13/2019  . Physical deconditioning 04/13/2019  . Protein-calorie malnutrition, severe 07/10/2017  . MRSA carrier 07/09/2017  . Anemia 07/08/2017  . Renal insufficiency 07/08/2017  . Ectatic aorta ([place]) 07/08/2017  . COPD exacerbation ([place]) 07/19/2016  . Lung nodule 07/19/2016  . Septic shock ([place]) 04/05/2016  . HCAP (healthcare-associated pneumonia) 04/05/2016  . CAP (community acquired pneumonia) 03/07/2016  . Acute respiratory failure with hypoxia ([place]) 03/16/2015  . COPD (chronic obstructive pulmonary disease) ([place]) 03/16/2015  . HTN (hypertension) 03/16/2015  . Leukocytosis 03/16/2015  . Hyperglycemia 03/16/2015  . Chronic pain syndrome 03/16/2015  . Hepatitis C 12/18/2007  . TOBACCO ABUSE 09/11/2007  . Substance abuse ([place]) 09/11/2007    Past Surgical History:  Procedure Laterality Date  . ANTERIOR CERVICAL DECOMP/DISCECTOMY FUSION  2002  . BACK SURGERY    . FEMUR IM NAIL Right 11/28/2012   Procedure: INTRAMEDULLARY (IM) RETROGRADE FEMORAL NAILING wants jackson table , c-arm and biomet ;  Surgeon: Mauri Pole,  MD;  Location: [facility] ORS;  Service: Orthopedics;  Laterality: Right;  . FRACTURE SURGERY    . INCONTINENCE SURGERY  1998  . TOTAL ABDOMINAL HYSTERECTOMY  1998     OB History   No obstetric history on file.      Home Medications    Prior to Admission medications   Medication Sig Start Date End Date Taking? Authorizing Provider  albuterol (PROVENTIL HFA;VENTOLIN HFA) 108 (90 Base) MCG/ACT inhaler Inhale 2 puffs into the lungs every 6 (six) hours as needed for wheezing or shortness of breath. 12/25/18  Yes Icard, Bradley L, DO  albuterol (PROVENTIL) (2.5 MG/3ML) 0.083% nebulizer  solution Take 3 mLs (2.5 mg total) by nebulization every 6 (six) hours as needed for wheezing or shortness of breath (buy this only if you cannot afford the Duoneb). 05/29/18  Yes Jola Schmidt, MD  amLODipine (NORVASC) 5 MG tablet Take 5 mg by mouth daily.   Yes [provider]  budesonide (PULMICORT) 0.5 MG/2ML nebulizer solution Take 2 mLs (0.5 mg total) by nebulization 2 (two) times daily. Please run through [facility] Dx J44.9 04/14/19 08/22/19 Yes Martyn Ehrich, NP  clonazePAM (KLONOPIN) 1 MG tablet Take 1 tablet (1 mg total) by mouth 2 (two) times daily as needed (anxiety). 08/05/19  Yes Debbe Odea, MD  feeding supplement, ENSURE ENLIVE, (ENSURE ENLIVE) LIQD Take 237 mLs by mouth 3 (three) times daily between meals. 07/11/17  Yes Mariel Aloe, MD  formoterol (PERFOROMIST) 20 MCG/2ML nebulizer solution Take 2 mLs (20 mcg total) by nebulization 2 (two) times daily. Please run through [facility] Dx J44.9 04/14/19 08/22/19 Yes Martyn Ehrich, NP  gabapentin (NEURONTIN) 800 MG tablet Take 800 mg by mouth 4 (four) times daily.    Yes [provider]  methadone (DOLOPHINE) 10 MG tablet Take 80 mg by mouth daily.   Yes [provider]  morphine (MSIR) 15 MG tablet Take 15 mg by mouth every 4 (four) hours as needed for severe pain.   Yes [provider]  nicotine (NICODERM CQ - DOSED IN MG/24 HOURS) 14 mg/24hr patch Place 1 patch (14 mg total) onto the skin daily. 07/30/19  Yes Nita Sells, MD  predniSONE (DELTASONE) 10 MG tablet Take 10 mg by mouth daily with breakfast.  11/21/18  Yes [provider]  sertraline (ZOLOFT) 100 MG tablet Take 100 mg by mouth daily.  11/21/18  Yes [provider]  traZODone (DESYREL) 50 MG tablet Take 1 tablet (50 mg total) by mouth at bedtime as needed for sleep. 08/05/19  Yes Debbe Odea, MD  Nebulizers (NEBULIZER COMPRESSOR) MISC Use as directed 12/25/18   Icard, Octavio Graves, DO    Family History Family  History  Problem Relation Age of Onset  . Cancer Mother   . Emphysema Mother   . Heart failure Father   . Stroke Father     Social History Social History   Tobacco Use  . Smoking status: Former Smoker    Packs/day: 1.00    Years: 47.00    Pack years: 47.00    Types: Cigarettes    Quit date: 07/09/2019    Years since quitting: 0.1  . Smokeless tobacco: Never Used  . Tobacco comment: Reports she is no longer able to smoke  Substance Use Topics  . Alcohol use: No  . Drug use: Yes    Types: Marijuana    Comment: History of narcotic abuse, [place] on Methadone; uses marijuana  Allergies   Aspirin   Review of Systems Review of Systems  Constitutional: Negative for chills and fever.  HENT: Negative for ear pain and sore throat.   Eyes: Negative for pain and visual disturbance.  Respiratory: Negative for cough and shortness of breath.   Cardiovascular: Negative for chest pain and palpitations.  Gastrointestinal: Negative for abdominal pain and vomiting.  Genitourinary: Negative for dysuria and hematuria.  Musculoskeletal: Negative for arthralgias and back pain.  Skin: Negative for color change and rash.  Neurological: Negative for seizures and syncope.  All other systems reviewed and are negative.    Physical Exam Updated Vital Signs BP 118/72   Pulse 61   Temp 97.8 F (36.6 C) (Oral)   Resp 10   Ht 5\' 7"  (1.702 m)   Wt 44.3 kg   SpO2 96%   BMI 15.30 kg/m   Physical Exam Vitals signs and nursing note reviewed.  Constitutional:      General: She is not in acute distress.    Appearance: She is well-developed.  HENT:     Head: Normocephalic and atraumatic.  Eyes:     Conjunctiva/sclera: Conjunctivae normal.  Neck:     Musculoskeletal: Neck supple.  Cardiovascular:     Rate and Rhythm: Normal rate and regular rhythm.     Heart sounds: No murmur.  Pulmonary:     Effort: Pulmonary effort is normal. No respiratory distress.      Breath sounds: Normal breath sounds.  Abdominal:     Palpations: Abdomen is soft.     Tenderness: There is no abdominal tenderness.  Skin:    General: Skin is warm and dry.  Neurological:     Mental Status: She is alert.      ED Treatments / Results  Labs (all labs ordered are listed, but only abnormal results are displayed) Labs Reviewed  CBC - Abnormal; Notable for the following components:      Result Value   Hemoglobin 11.4 (*)    RDW 15.9 (*)    All other components within normal limits  BASIC METABOLIC PANEL - Abnormal; Notable for the following components:   Sodium 132 (*)    Chloride 96 (*)    Calcium 8.7 (*)    All other components within normal limits    EKG EKG Interpretation  Date/Time:  Saturday August 22 2019 10:34:24 EST Ventricular Rate:  68 PR Interval:    QRS Duration: 92 QT Interval:  421 QTC Calculation: 448 R Axis:   103 Text Interpretation: Sinus rhythm Probable lateral infarct, age indeterminate Anteroseptal infarct, age indeterminate Confirmed by Madalyn Rob [phone]) on 08/22/2019 11:31:21 AM   Radiology No results found.  Procedures Procedures (including critical care time)  Medications Ordered in ED Medications - No data to display   Initial Impression / Assessment and Plan / ED Course  I have reviewed the triage vital signs and the nursing notes.  Pertinent labs & imaging results that were available during my care of the patient were reviewed by me and considered in my medical decision making (see chart for details).  Clinical Course as of Aug 21 1526  Sat Aug 22, 2019  1130 Poison control recommended obs ~8 hours from time of ingestion, can consider activated charcoal; basic labs and ekg   [RD]  1527 Rechecked, updated on results, will discharge home   [RD]  1527 No ongoing symptoms, well-appearing   [RD]    Clinical Course User Index [RD] Lucrezia Starch, MD  63 year old lady presents to ER after  accidental ingestion of extra amlodipine tablets at home.  Here patient is noted to be well-appearing, normal vital signs.  Case was reviewed with poison control who recommended observation in ER for 8 hours from time of ingestion, basic labs, EKG.  Basic labs unremarkable, EKG without acute ischemic changes.  Patient remained asymptomatic with normal vital signs on observation.  Will discharge home.    After the discussed management above, the patient was determined to be safe for discharge.  The patient was in agreement with this plan and all questions regarding their care were answered.  ED return precautions were discussed and the patient will return to the ED with any significant worsening of condition.    Final Clinical Impressions(s) / ED Diagnoses   Final diagnoses:  Accidental overdose, initial encounter  Palliative care patient    ED Discharge Orders    None       Lucrezia Starch, MD 08/22/19 1528

## 2019-08-22 NOTE — ED Notes (Signed)
Patient verbalized understanding of discharge instructions. Opportunities for questioning and answers were provided. Armband removed by staff. Patient removed from ED.

## 2019-08-22 NOTE — ED Notes (Signed)
This RN spoke with poison control regarding patient. PC had already been initially contacted by hospice nurse. Recommend patient receive activated charcoal, IV fluids, EKG and monitor for 8 hours minimum.

## 2019-08-22 NOTE — ED Notes (Signed)
Got patient on the monitor did ekg shown to er doctor patient is resting with call bell in reach and nurse at bedside

## 2019-08-22 NOTE — Discharge Instructions (Signed)
If you develop any weakness, episodes of passing out, lightheadedness, chest pain or other new concerning symptom, please return to ER for reassessment.  Otherwise recommend follow-up with your primary doctor as needed.

## 2019-08-22 NOTE — ED Triage Notes (Signed)
Per [facility] pt coming from home after accidentally taking her amlodipine 5 mg for her methadone. Patient thinking she took about 8 pills. Patient then took her methadone. Patient alert and orientated, denies any thoughts of trying to harm herself. States she did not sleep well and was drowsy when taking medicine. Patient only c/o chronic back pain and requesting morphine that she is scheduled to take daily.

## 2019-08-22 NOTE — Progress Notes (Signed)
[facility]  Pt is under hospice services at home for a terminal diagnosis of lung cancer.  Pt accidentally took "6 amlodipine" tablets instead of her methadone.  Amlodipine had been discontinued, but she still had the bottle of pills, and mistakenly took them thinking it was methadone.  Previous rx for amlodipine was for 5 mg.  Poison control advised she be sent to the ED.  If admission is not necessary and she should be discharged, please use [facility] for transport.  They provide this service to our active hospice pts.  Venia Carbon RN, BSN, [facility] (in [place]) [phone]

## 2019-09-08 DEATH — deceased
